# Patient Record
Sex: Female | Born: 1970
Health system: Southern US, Community
[De-identification: ages and names within clinical notes are randomized; demographics above are authoritative.]

## PROBLEM LIST (undated history)

## (undated) DIAGNOSIS — J45909 Unspecified asthma, uncomplicated: Secondary | ICD-10-CM

## (undated) DIAGNOSIS — N189 Chronic kidney disease, unspecified: Secondary | ICD-10-CM

## (undated) DIAGNOSIS — E785 Hyperlipidemia, unspecified: Secondary | ICD-10-CM

## (undated) DIAGNOSIS — E119 Type 2 diabetes mellitus without complications: Secondary | ICD-10-CM

## (undated) DIAGNOSIS — G709 Myoneural disorder, unspecified: Secondary | ICD-10-CM

## (undated) DIAGNOSIS — G43909 Migraine, unspecified, not intractable, without status migrainosus: Secondary | ICD-10-CM

## (undated) DIAGNOSIS — I10 Essential (primary) hypertension: Secondary | ICD-10-CM

## (undated) DIAGNOSIS — D649 Anemia, unspecified: Secondary | ICD-10-CM

## (undated) DIAGNOSIS — M1990 Unspecified osteoarthritis, unspecified site: Secondary | ICD-10-CM

## (undated) DIAGNOSIS — E11319 Type 2 diabetes mellitus with unspecified diabetic retinopathy without macular edema: Secondary | ICD-10-CM

## (undated) DIAGNOSIS — T7840XA Allergy, unspecified, initial encounter: Secondary | ICD-10-CM

## (undated) HISTORY — DX: Chronic kidney disease, unspecified: N18.9

## (undated) HISTORY — DX: Anemia, unspecified: D64.9

## (undated) HISTORY — DX: Myoneural disorder, unspecified: G70.9

## (undated) HISTORY — DX: Allergy, unspecified, initial encounter: T78.40XA

## (undated) HISTORY — DX: Type 2 diabetes mellitus with unspecified diabetic retinopathy without macular edema: E11.319

## (undated) HISTORY — DX: Unspecified asthma, uncomplicated: J45.909

## (undated) HISTORY — DX: Hyperlipidemia, unspecified: E78.5

## (undated) HISTORY — DX: Unspecified osteoarthritis, unspecified site: M19.90

## (undated) HISTORY — DX: Essential (primary) hypertension: I10

## (undated) HISTORY — PX: SHOULDER SURGERY: SHX246

## (undated) HISTORY — DX: Type 2 diabetes mellitus without complications: E11.9

## (undated) HISTORY — DX: Migraine, unspecified, not intractable, without status migrainosus: G43.909

## (undated) HISTORY — PX: TUBAL LIGATION: SHX77

---

## 1998-10-29 HISTORY — PX: REDUCTION MAMMAPLASTY: SUR839

## 1999-10-30 HISTORY — PX: BREAST REDUCTION SURGERY: SHX8

## 2010-10-06 ENCOUNTER — Emergency Department (HOSPITAL_COMMUNITY)
Admission: EM | Admit: 2010-10-06 | Discharge: 2010-10-06 | Payer: Self-pay | Source: Home / Self Care | Admitting: Family Medicine

## 2011-01-09 LAB — GLUCOSE, CAPILLARY: Glucose-Capillary: 576 mg/dL (ref 70–99)

## 2011-01-09 LAB — POCT I-STAT, CHEM 8
Calcium, Ion: 1.15 mmol/L (ref 1.12–1.32)
Chloride: 99 mEq/L (ref 96–112)
Glucose, Bld: 574 mg/dL (ref 70–99)
HCT: 43 % (ref 36.0–46.0)

## 2011-01-09 LAB — POCT URINALYSIS DIPSTICK
Ketones, ur: NEGATIVE mg/dL
Protein, ur: NEGATIVE mg/dL
Specific Gravity, Urine: 1.01 (ref 1.005–1.030)
pH: 6 (ref 5.0–8.0)

## 2012-05-22 ENCOUNTER — Ambulatory Visit: Payer: Self-pay

## 2012-06-26 ENCOUNTER — Ambulatory Visit: Payer: Self-pay

## 2013-02-14 ENCOUNTER — Emergency Department: Payer: Self-pay | Admitting: Internal Medicine

## 2013-02-14 LAB — COMPREHENSIVE METABOLIC PANEL
Albumin: 3.8 g/dL (ref 3.4–5.0)
Bilirubin,Total: 0.6 mg/dL (ref 0.2–1.0)
Chloride: 101 mmol/L (ref 98–107)
Co2: 25 mmol/L (ref 21–32)
EGFR (African American): >60
Glucose: 349 mg/dL — ABNORMAL HIGH (ref 65–99)
Osmolality: 283 (ref 275–301)
Sodium: 134 mmol/L — ABNORMAL LOW (ref 136–145)
Total Protein: 8.7 g/dL — ABNORMAL HIGH (ref 6.4–8.2)

## 2013-02-14 LAB — URINALYSIS, COMPLETE
Bilirubin,UR: NEGATIVE
Glucose,UR: >=500 mg/dL (ref 0–75)
Protein: NEGATIVE
Squamous Epithelial: 7
WBC UR: 7 /HPF (ref 0–5)

## 2013-02-14 LAB — CBC
HGB: 14.6 g/dL (ref 12.0–16.0)
MCH: 30.9 pg (ref 26.0–34.0)
MCHC: 32.8 g/dL (ref 32.0–36.0)
MCV: 94 fL (ref 80–100)
RBC: 4.74 10*6/uL (ref 3.80–5.20)
RDW: 13.1 % (ref 11.5–14.5)
WBC: 7.9 10*3/uL (ref 3.6–11.0)

## 2013-02-18 ENCOUNTER — Ambulatory Visit: Payer: Self-pay | Admitting: Physician Assistant

## 2013-02-24 ENCOUNTER — Ambulatory Visit: Payer: Self-pay | Admitting: Physician Assistant

## 2013-06-25 ENCOUNTER — Ambulatory Visit: Payer: Self-pay

## 2013-10-26 ENCOUNTER — Encounter: Payer: Self-pay | Admitting: Adult Health

## 2013-10-26 ENCOUNTER — Ambulatory Visit (INDEPENDENT_AMBULATORY_CARE_PROVIDER_SITE_OTHER): Payer: BC Managed Care – PPO | Admitting: Adult Health

## 2013-10-26 ENCOUNTER — Encounter (INDEPENDENT_AMBULATORY_CARE_PROVIDER_SITE_OTHER): Payer: Self-pay

## 2013-10-26 VITALS — BP 110/78 | HR 82 | Temp 98.6°F | Resp 12 | Ht 65.5 in | Wt 203.0 lb

## 2013-10-26 DIAGNOSIS — E119 Type 2 diabetes mellitus without complications: Secondary | ICD-10-CM

## 2013-10-26 DIAGNOSIS — Z23 Encounter for immunization: Secondary | ICD-10-CM

## 2013-10-26 MED ORDER — GLUCOSE BLOOD VI STRP: ORAL_STRIP | Status: DC

## 2013-10-26 MED ORDER — ONETOUCH ULTRASOFT LANCETS MISC: Status: DC

## 2013-10-26 NOTE — Assessment & Plan Note (Addendum)
New patient to establish care. Hx of DM not controlled. On lantus 33 units at bedtime, glipizide 10 mg bid and metformin 1000 mg bid. Lisinopril 10 mg daily for renal protection. Check HgbA1c. Diabetic foot exam normal and documented. Reports having recent labs at previous PCP. Request records.

## 2013-10-26 NOTE — Progress Notes (Signed)
Subjective:    Patient ID: Janice Jennings, female    DOB: Aug 21, 1971, 42 y.o.   MRN: [id number]  HPI  Patient is a pleasant 42 year old female who presents to clinic to establish care. She was previously followed by Dr. Ola Jennings at [facility]. She has been seen in the past by Dr. Gabriel Jennings, Endocrinology, for her hx of DM. Her blood sugars have been running in the 260s. She reports traveling to [place] recently and forgot her insulin. She also reports that her insulin pen was not working correctly; however, she had not noticed it. Pt takes lisinopril for renal protection. She does not have hx of HTN. She was also followed at [facility] for her GYN needs. Last PAP 2013. Mammogram done ~ September 2014 and reports also normal. Request records from previous PCP.    Past Medical History  Diagnosis Date  . Diabetes mellitus without complication   . Migraine     Excedrin Tension HA OTC     Past Surgical History  Procedure Laterality Date  . Breast reduction surgery  2001     Family History  Problem Relation Age of Onset  . Diabetes Father   . Hyperlipidemia Father   . Hypertension Father   . Cancer Maternal Aunt 73    leukemia - died  . Diabetes Maternal Grandmother   . Cancer Maternal Grandmother     breast cancer  . Stroke Maternal Grandmother   . Diabetes Daughter     Pre-diabetes  . Diabetes Maternal Grandfather   . Hyperlipidemia Maternal Grandfather   . Hypertension Maternal Grandfather   . Cancer Maternal Aunt 50    breast cancer - died     History   Social History  . Marital Status: Single    Spouse Name: N/A    Number of Children: 2  . Years of Education: 18   Occupational History  . 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 Smokeless tobacco: Never Used  . Alcohol Use: Yes     Comment: Occasional - maybe 3 times a month  . Drug Use: No  . Sexual Activity: Not on file   Other Topics Concern  .  Not on file   Social History Narrative   Janice Jennings grew up in [place], [place] and also in [place]. She attending the [employer] and obtained her Bachelors in Psychology. She then attended [employer] and obtained her Masters in Psychology. 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 She lives at home with her two children, Janice Jennings age 50 and Janice Jennings age 64. They have a dog named Coco who she describes as having separation anxiety. Kalinda enjoys activities with the children. They enjoy outdoor activities, watching movies.       Review of Systems  Constitutional: Negative.   HENT: Negative.   Eyes: Negative.   Respiratory: Negative.   Cardiovascular: Negative.   Gastrointestinal: Negative.   Endocrine: Negative.   Genitourinary: Negative.   Musculoskeletal: Negative.   Skin: Negative.   Allergic/Immunologic: Negative.   Neurological: Negative.   Hematological: Negative.   Psychiatric/Behavioral: Negative.        Objective:   Physical Exam  Constitutional: She is oriented to person, place, and time. She appears well-developed and well-nourished. No distress.  HENT:  Head: Normocephalic and atraumatic.  Right Ear: External ear normal.  Left Ear: External ear normal.  Nose: Nose normal.  Mouth/Throat:  Oropharynx is clear and moist.  Eyes: Conjunctivae and EOM are normal. Pupils are equal, round, and reactive to light.  Neck: Normal range of motion. Neck supple. No tracheal deviation present. No thyromegaly present.  Cardiovascular: Normal rate, regular rhythm, normal heart sounds and intact distal pulses.  Exam reveals no gallop and no friction rub.   No murmur heard. Pulmonary/Chest: Effort normal and breath sounds normal. No respiratory distress. She has no wheezes. She has no rales.  Abdominal: Soft. Bowel sounds are normal. She exhibits no distension and no mass. There is no tenderness. There is no rebound and no guarding.    Musculoskeletal: Normal range of motion. She exhibits no edema and no tenderness.  Lymphadenopathy:    She has no cervical adenopathy.  Neurological: She is alert and oriented to person, place, and time. She has normal reflexes. No cranial nerve deficit. Coordination normal.  Skin: Skin is warm and dry.  Psychiatric: She has a normal mood and affect. Her behavior is normal. Judgment and thought content normal.     BP 110/78  Pulse 82  Temp(Src) 98.6 F (37 C) (Oral)  Resp 12  Ht 5' 5.5" (1.664 m)  Wt 203 lb (92.08 kg)  BMI 33.26 kg/m2  SpO2 97%  LMP 10/12/2013       Assessment & Plan:

## 2013-10-26 NOTE — Patient Instructions (Signed)
  Your lancets and strips have been sent to your pharmacy.   Thank you for choosing [facility] for your health care needs.  Please have your labs drawn prior to leaving the office.  The results will be available through MyChart for your convenience. Please remember to activate this. The activation code is located at the end of this form.

## 2013-10-26 NOTE — Assessment & Plan Note (Signed)
Flu vaccine given during clinic.

## 2013-10-27 ENCOUNTER — Encounter: Payer: Self-pay | Admitting: *Deleted

## 2013-10-30 ENCOUNTER — Encounter: Payer: Self-pay | Admitting: Adult Health

## 2013-10-30 MED ORDER — INSULIN GLARGINE 100 UNIT/ML SOLOSTAR PEN: 33.0000 [IU] | PEN_INJECTOR | Freq: Every day | SUBCUTANEOUS | Status: DC

## 2013-11-02 ENCOUNTER — Encounter: Payer: Self-pay | Admitting: Adult Health

## 2013-11-04 ENCOUNTER — Other Ambulatory Visit: Payer: Self-pay | Admitting: Adult Health

## 2013-11-04 DIAGNOSIS — M25561 Pain in right knee: Secondary | ICD-10-CM

## 2013-11-04 DIAGNOSIS — M25562 Pain in left knee: Principal | ICD-10-CM

## 2014-01-22 ENCOUNTER — Encounter: Payer: Self-pay | Admitting: Adult Health

## 2014-01-22 MED ORDER — INSULIN PEN NEEDLE 31G X 8 MM MISC: Status: DC

## 2014-01-22 MED ORDER — GLIPIZIDE 10 MG PO TABS: 10.0000 mg | ORAL_TABLET | Freq: Two times a day (BID) | ORAL | Status: DC

## 2014-03-08 ENCOUNTER — Encounter: Payer: Self-pay | Admitting: Adult Health

## 2014-03-11 LAB — HM DIABETES EYE EXAM

## 2014-04-29 ENCOUNTER — Encounter: Payer: Self-pay | Admitting: Adult Health

## 2014-05-12 ENCOUNTER — Encounter: Payer: Self-pay | Admitting: *Deleted

## 2014-05-21 ENCOUNTER — Encounter: Payer: Self-pay | Admitting: *Deleted

## 2014-05-21 NOTE — Progress Notes (Signed)
Chart reviewed for DM bundle. Mychart sent on need for appt and labs.  Appt sch 05/26/14

## 2014-05-25 ENCOUNTER — Ambulatory Visit: Payer: BC Managed Care – PPO | Admitting: Adult Health

## 2014-05-26 ENCOUNTER — Encounter: Payer: Self-pay | Admitting: Adult Health

## 2014-05-26 ENCOUNTER — Ambulatory Visit (INDEPENDENT_AMBULATORY_CARE_PROVIDER_SITE_OTHER): Payer: BC Managed Care – PPO | Admitting: Adult Health

## 2014-05-26 VITALS — BP 136/90 | HR 90 | Temp 98.5°F | Resp 14 | Wt 204.8 lb

## 2014-05-26 DIAGNOSIS — IMO0001 Reserved for inherently not codable concepts without codable children: Secondary | ICD-10-CM

## 2014-05-26 DIAGNOSIS — M6530 Trigger finger, unspecified finger: Secondary | ICD-10-CM | POA: Insufficient documentation

## 2014-05-26 DIAGNOSIS — E1165 Type 2 diabetes mellitus with hyperglycemia: Secondary | ICD-10-CM

## 2014-05-26 DIAGNOSIS — E1142 Type 2 diabetes mellitus with diabetic polyneuropathy: Secondary | ICD-10-CM | POA: Insufficient documentation

## 2014-05-26 DIAGNOSIS — IMO0002 Reserved for concepts with insufficient information to code with codable children: Secondary | ICD-10-CM

## 2014-05-26 DIAGNOSIS — Z23 Encounter for immunization: Secondary | ICD-10-CM | POA: Insufficient documentation

## 2014-05-26 LAB — LIPID PANEL
CHOL/HDL RATIO: 4
Cholesterol: 183 mg/dL (ref 0–200)
HDL: 41.5 mg/dL (ref >39.00)
LDL Cholesterol: 118 mg/dL — ABNORMAL HIGH (ref 0–99)
NONHDL: 141.5
Triglycerides: 120 mg/dL (ref 0.0–149.0)
VLDL: 24 mg/dL (ref 0.0–40.0)

## 2014-05-26 LAB — HEMOGLOBIN A1C: HEMOGLOBIN A1C: 7.9 % — AB (ref 4.6–6.5)

## 2014-05-26 NOTE — Progress Notes (Signed)
Pre visit review using our clinic review tool, if applicable. No additional management support is needed unless otherwise documented below in the visit note. 

## 2014-05-26 NOTE — Progress Notes (Signed)
Patient ID: Janice Jennings, female   DOB: 25-Sep-1971, 43 y.o.   MRN: [id number]   Subjective:    Patient ID: Janice Jennings, female    DOB: October 26, 1971, 43 y.o.   MRN: [id number]  HPI  Pt presents for DM type 2 uncontrolled follow up. She reports that she is taking her medications as prescribed. She was not following appropriate diet towards the end of May when she developed a tooth infection that was difficult to heal. She reports she just didn't do well with the diet. Diabetic eye exam every 6 months at [facility]. I will request the report.   Past Medical History  Diagnosis Date  . Diabetes mellitus without complication   . Migraine     Excedrin Tension HA OTC    Current Outpatient Prescriptions on File Prior to Visit  Medication Sig Dispense Refill  . Cinnamon 500 MG capsule Take 500 mg by mouth daily.      Marland Kitchen glipiZIDE (GLUCOTROL) 10 MG tablet Take 1 tablet (10 mg total) by mouth 2 (two) times daily before a meal.  60 tablet  5  . glucose blood test strip One Touch Ultra, check blood sugar daily  100 each  6  . Insulin Glargine (LANTUS SOLOSTAR) 100 UNIT/ML Solostar Pen Inject 33 Units into the skin at bedtime.  15 mL  5  . Insulin Pen Needle 31G X 8 MM MISC Use as directed  100 each  5  . Lancets (ONETOUCH ULTRASOFT) lancets Use as instructed daily  100 each  6  . lisinopril (PRINIVIL,ZESTRIL) 10 MG tablet Take 10 mg by mouth daily.       . metFORMIN (GLUCOPHAGE) 1000 MG tablet Take 1,000 mg by mouth 2 (two) times daily with a meal.       . Multiple Vitamin (MULTIVITAMIN) tablet Take 1 tablet by mouth daily.       No current facility-administered medications on file prior to visit.   Review of Systems  Constitutional: Negative.   HENT: Negative.   Eyes: Negative.   Respiratory: Negative.   Cardiovascular: Negative.   Gastrointestinal: Negative.   Endocrine: Negative.   Genitourinary: Negative.   Musculoskeletal:       Right hand ring finger is bent. She reports that she  cannot straighten. "it won't stay". She denies pain or injury to the area.  Skin: Negative.   Allergic/Immunologic: Negative.   Neurological: Negative.   Hematological: Negative.   Psychiatric/Behavioral: Negative.        Objective:  BP 136/90  Pulse 90  Temp(Src) 98.5 F (36.9 C) (Oral)  Resp 14  Wt 204 lb 12 oz (92.874 kg)  SpO2 98%   Physical Exam  Constitutional: She is oriented to person, place, and time. No distress.  HENT:  Head: Normocephalic and atraumatic.  Eyes: Conjunctivae and EOM are normal.  Neck: Normal range of motion. Neck supple.  Cardiovascular: Normal rate, regular rhythm, normal heart sounds and intact distal pulses.  Exam reveals no gallop and no friction rub.   No murmur heard. Pulmonary/Chest: Effort normal and breath sounds normal. No respiratory distress. She has no wheezes. She has no rales.  Musculoskeletal:  Right hand ring finger is bent. Straightens manually without pain or difficulty. No inflammation noted.  Neurological: She is alert and oriented to person, place, and time. She has normal reflexes. Coordination normal.  Skin: Skin is warm and dry.  Psychiatric: She has a normal mood and affect. Her behavior is normal. Judgment and thought content normal.  Assessment & Plan:   1. Diabetes type 2, uncontrolled Check labs. Adjust medications if indicated. Follow up 3 months - Lipid panel - Hemoglobin A1c; Future  2. Need for prophylactic vaccination against Streptococcus pneumoniae (pneumococcus) Given in clinic  3. Need for vaccine for TD (tetanus-diphtheria) Given in clinic  4. Trigger finger of right hand Splint finger. Massage. If no improvement in 2 weeks will refer to Dr. Jefm Bryant.

## 2014-05-26 NOTE — Patient Instructions (Signed)
  Please have labs drawn prior to leaving the office.  We will contact you with the results.  Continue medications and I will adjust as needed based on your results.  Follow diabetic diet, increase physical activity and monitor your blood glucose at least twice daily and records.  Apply a splint to your ring finger. Keep on during the day and remove at bedtime. Massage your palm up to your finger. If no improvement within 2 weeks I will refer you to a specialist.

## 2014-06-22 ENCOUNTER — Encounter: Payer: Self-pay | Admitting: Adult Health

## 2014-07-19 ENCOUNTER — Other Ambulatory Visit: Payer: Self-pay

## 2014-07-19 MED ORDER — GLIPIZIDE 10 MG PO TABS: 10.0000 mg | ORAL_TABLET | Freq: Two times a day (BID) | ORAL | Status: DC

## 2014-07-19 NOTE — Telephone Encounter (Signed)
Patient requested 90 day supply.

## 2014-07-24 ENCOUNTER — Ambulatory Visit (INDEPENDENT_AMBULATORY_CARE_PROVIDER_SITE_OTHER): Payer: BC Managed Care – PPO

## 2014-07-24 DIAGNOSIS — Z23 Encounter for immunization: Secondary | ICD-10-CM

## 2014-09-22 ENCOUNTER — Encounter: Payer: Self-pay | Admitting: *Deleted

## 2014-09-22 ENCOUNTER — Ambulatory Visit: Payer: Self-pay | Admitting: Adult Health

## 2014-09-22 LAB — HM MAMMOGRAPHY

## 2014-09-24 ENCOUNTER — Ambulatory Visit: Payer: BC Managed Care – PPO | Admitting: Internal Medicine

## 2014-09-25 ENCOUNTER — Encounter: Payer: Self-pay | Admitting: Internal Medicine

## 2014-09-25 ENCOUNTER — Other Ambulatory Visit: Payer: Self-pay | Admitting: *Deleted

## 2014-09-27 MED ORDER — LISINOPRIL 10 MG PO TABS: 10.0000 mg | ORAL_TABLET | Freq: Every day | ORAL | Status: DC

## 2014-10-05 ENCOUNTER — Encounter: Payer: Self-pay | Admitting: Adult Health

## 2014-10-20 ENCOUNTER — Ambulatory Visit (INDEPENDENT_AMBULATORY_CARE_PROVIDER_SITE_OTHER): Payer: BC Managed Care – PPO | Admitting: Nurse Practitioner

## 2014-10-20 ENCOUNTER — Ambulatory Visit: Payer: BC Managed Care – PPO | Admitting: Internal Medicine

## 2014-10-20 ENCOUNTER — Encounter: Payer: Self-pay | Admitting: Nurse Practitioner

## 2014-10-20 VITALS — BP 128/76 | HR 92 | Temp 98.6°F | Resp 12 | Ht 65.5 in | Wt 202.4 lb

## 2014-10-20 DIAGNOSIS — IMO0002 Reserved for concepts with insufficient information to code with codable children: Secondary | ICD-10-CM

## 2014-10-20 DIAGNOSIS — E1165 Type 2 diabetes mellitus with hyperglycemia: Secondary | ICD-10-CM

## 2014-10-20 DIAGNOSIS — E669 Obesity, unspecified: Secondary | ICD-10-CM | POA: Insufficient documentation

## 2014-10-20 DIAGNOSIS — Z7251 High risk heterosexual behavior: Secondary | ICD-10-CM

## 2014-10-20 LAB — MICROALBUMIN / CREATININE URINE RATIO
Creatinine,U: 83 mg/dL
Microalb Creat Ratio: 11.2 mg/g (ref 0.0–30.0)
Microalb, Ur: 9.3 mg/dL — ABNORMAL HIGH (ref 0.0–1.9)

## 2014-10-20 LAB — HEMOGLOBIN A1C: HEMOGLOBIN A1C: 9 % — AB (ref 4.6–6.5)

## 2014-10-20 NOTE — Patient Instructions (Addendum)
We will contact you with your results.  Continue using a condom until testing complete.   Make sure to check your blood sugars at least once a day and bring in a log of recorded blood sugars. (Date, time, fasting or after meals, blood sugar)  Follow up in 3 months.  Happy Holidays!

## 2014-10-20 NOTE — Assessment & Plan Note (Addendum)
New sexual partner. Pt would like to be screened for STD's. Partner is being tested elsewhere. 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 Hand out with information about HSV 2.

## 2014-10-20 NOTE — Progress Notes (Signed)
Subjective:    Patient ID: Janice Jennings, female    DOB: 05/17/1971, 43 y.o.   MRN: [id number]  HPI  [name] is a 43 yo female with a 3 month FU for diabetes type II.   1) She is on Metformin 1000 mg tablets twice daily.   Glipizide 10 mg tablet twice daily  Insulin glargine once at night 30 units  Below 200 fasting at home, only checks when something is off   She is UTD on her eye exam, A1c,  flu shot, and pneumonia vaccine  Lab Results  Component Value Date   CHOL 183 05/26/2014   HDL 41.50 05/26/2014   LDLCALC 118* 05/26/2014   TRIG 120.0 05/26/2014   CHOLHDL 4 05/26/2014   Lab Results  Component Value Date   HGBA1C 7.9* 05/26/2014   2) New sexual partner x 3 months, using protection currently- condoms, and discussing not using. Pt has tubes tied in 2004.   Review of Systems  Constitutional: Negative for fever, chills, diaphoresis, appetite change, fatigue and unexpected weight change.  Gastrointestinal: Negative for nausea, vomiting and diarrhea.  Endocrine: Negative for polydipsia, polyphagia and polyuria.  Skin: Negative for rash.   Past Medical History  Diagnosis Date  . Diabetes mellitus without complication   . Migraine     Excedrin Tension HA OTC    History   Social History  . Marital Status: Single    Spouse Name: N/A    Number of Children: 2  . Years of Education: 18   Occupational History  . 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 Smokeless tobacco: Never Used  . Alcohol Use: Yes     Comment: Occasional - maybe 3 times a month  . Drug Use: No  . Sexual Activity: Not on file   Other Topics Concern  . Not on file   Social History Narrative   [name] grew up in [place], [place] and also in [place]. She attending the [employer] and obtained her Bachelors in Psychology. She then attended [employer] and obtained her Masters in Psychology. 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 She lives at home with her two children, Darius age 29 and [place] age 30. They have a dog named Coco who she describes as having separation anxiety. Amarachukwu enjoys activities with the children. They enjoy outdoor activities, watching movies.      Past Surgical History  Procedure Laterality Date  . Breast reduction surgery  2001    Family History  Problem Relation Age of Onset  . Diabetes Father   . Hyperlipidemia Father   . Hypertension Father   . Cancer Maternal Aunt 64    leukemia - died  . Diabetes Maternal Grandmother   . Cancer Maternal Grandmother     breast cancer  . Stroke Maternal Grandmother   . Diabetes Daughter     Pre-diabetes  . Diabetes Maternal Grandfather   . Hyperlipidemia Maternal Grandfather   . Hypertension Maternal Grandfather   . Cancer Maternal Aunt 50    breast cancer - died    No Known Allergies  Current Outpatient Prescriptions on File Prior to Visit  Medication Sig Dispense Refill  . Biotin 5000 MCG CAPS Take 10,000 mcg by mouth daily.    . Cinnamon 500 MG capsule Take 500 mg by mouth daily.    Marland Kitchen GARCINIA CAMBOGIA-CHROMIUM PO Take 1  capsule by mouth daily.    Marland Kitchen glipiZIDE (GLUCOTROL) 10 MG tablet Take 1 tablet (10 mg total) by mouth 2 (two) times daily before a meal. 180 tablet 1  . glucose blood test strip One Touch Ultra, check blood sugar daily 100 each 6  . Insulin Glargine (LANTUS SOLOSTAR) 100 UNIT/ML Solostar Pen Inject 33 Units into the skin at bedtime. 15 mL 5  . Insulin Pen Needle 31G X 8 MM MISC Use as directed 100 each 5  . Lancets (ONETOUCH ULTRASOFT) lancets Use as instructed daily 100 each 6  . lisinopril (PRINIVIL,ZESTRIL) 10 MG tablet Take 1 tablet (10 mg total) by mouth daily. 90 tablet 1  . metFORMIN (GLUCOPHAGE) 1000 MG tablet Take 1,000 mg by mouth 2 (two) times daily with a meal.     . Multiple Vitamin (MULTIVITAMIN) tablet Take 1 tablet by mouth daily.    Marland Kitchen OVER THE COUNTER  MEDICATION 1 tablet daily. Keratin vitamin     No current facility-administered medications on file prior to visit.       Objective:   Physical Exam  Constitutional: She is oriented to person, place, and time. She appears well-developed and well-nourished. No distress.  HENT:  Head: Normocephalic and atraumatic.  Mouth/Throat: No oropharyngeal exudate.  Eyes: Conjunctivae and EOM are normal. Pupils are equal, round, and reactive to light.  Neck: Normal range of motion.  Cardiovascular: Normal rate and regular rhythm.   Pulmonary/Chest: Effort normal and breath sounds normal.  Musculoskeletal: Normal range of motion. She exhibits no edema or tenderness.  Neurological: She is alert and oriented to person, place, and time. Coordination normal.  Skin: Skin is warm and dry. No rash noted. She is not diaphoretic.  No wounds on feet bilaterally  Psychiatric: She has a normal mood and affect. Her behavior is normal. Judgment and thought content normal.     BP 128/76 mmHg  Pulse 92  Temp(Src) 98.6 F (37 C) (Oral)  Resp 12  Ht 5' 5.5" (1.664 m)  Wt 202 lb 6.4 oz (91.808 kg)  BMI 33.16 kg/m2  SpO2 95%  LMP 09/30/2014     Assessment & Plan:

## 2014-10-20 NOTE — Assessment & Plan Note (Addendum)
Uncontrolled- On metformin 1000 mg bid and glipizide 10 mg bid and insulin glargine QHS 30 units. Foot exam was normal today and documented. Obtain A1c and urine microalbumin. Asked her to record BS, time, and date and try to check daily. Will FU in 3 months

## 2014-10-20 NOTE — Progress Notes (Signed)
Pre visit review using our clinic review tool, if applicable. No additional management support is needed unless otherwise documented below in the visit note. 

## 2014-10-21 LAB — GC/CHLAMYDIA PROBE AMP, URINE
Chlamydia, Swab/Urine, PCR: NEGATIVE
GC PROBE AMP, URINE: NEGATIVE

## 2014-10-21 LAB — STD PANEL
HIV 1&2 Ab, 4th Generation: NONREACTIVE
Hepatitis B Surface Ag: NEGATIVE

## 2014-10-21 LAB — HSV 2 ANTIBODY, IGG: HSV 2 Glycoprotein G Ab, IgG: 13.79 IV — ABNORMAL HIGH

## 2014-12-03 ENCOUNTER — Encounter: Payer: Self-pay | Admitting: Nurse Practitioner

## 2014-12-03 MED ORDER — METFORMIN HCL 1000 MG PO TABS: 1000.0000 mg | ORAL_TABLET | Freq: Two times a day (BID) | ORAL | Status: DC

## 2015-01-14 ENCOUNTER — Ambulatory Visit: Payer: BC Managed Care – PPO | Admitting: Nurse Practitioner

## 2015-02-21 ENCOUNTER — Other Ambulatory Visit: Payer: Self-pay

## 2015-02-21 MED ORDER — GLIPIZIDE 10 MG PO TABS: 10.0000 mg | ORAL_TABLET | Freq: Two times a day (BID) | ORAL | Status: DC

## 2015-03-07 ENCOUNTER — Encounter: Payer: Self-pay | Admitting: Internal Medicine

## 2015-03-07 ENCOUNTER — Ambulatory Visit (INDEPENDENT_AMBULATORY_CARE_PROVIDER_SITE_OTHER): Payer: BC Managed Care – PPO | Admitting: Internal Medicine

## 2015-03-07 ENCOUNTER — Telehealth: Payer: Self-pay | Admitting: Nurse Practitioner

## 2015-03-07 VITALS — BP 166/100 | HR 104 | Temp 98.7°F | Wt 209.0 lb

## 2015-03-07 DIAGNOSIS — I10 Essential (primary) hypertension: Secondary | ICD-10-CM | POA: Diagnosis not present

## 2015-03-07 LAB — COMPREHENSIVE METABOLIC PANEL
ALT: 16 U/L (ref 0–35)
AST: 14 U/L (ref 0–37)
Albumin: 3.6 g/dL (ref 3.5–5.2)
Alkaline Phosphatase: 63 U/L (ref 39–117)
BILIRUBIN TOTAL: 0.5 mg/dL (ref 0.2–1.2)
BUN: 11 mg/dL (ref 6–23)
CO2: 28 meq/L (ref 19–32)
Calcium: 9.9 mg/dL (ref 8.4–10.5)
Chloride: 99 mEq/L (ref 96–112)
Creatinine, Ser: 0.85 mg/dL (ref 0.40–1.20)
GFR: 92.61 mL/min (ref >60.00)
Glucose, Bld: 235 mg/dL — ABNORMAL HIGH (ref 70–99)
POTASSIUM: 4 meq/L (ref 3.5–5.1)
SODIUM: 134 meq/L — AB (ref 135–145)
Total Protein: 7.5 g/dL (ref 6.0–8.3)

## 2015-03-07 LAB — CBC
HEMATOCRIT: 41 % (ref 36.0–46.0)
Hemoglobin: 13.6 g/dL (ref 12.0–15.0)
MCHC: 33.2 g/dL (ref 30.0–36.0)
MCV: 91.8 fl (ref 78.0–100.0)
Platelets: 324 10*3/uL (ref 150.0–400.0)
RBC: 4.46 Mil/uL (ref 3.87–5.11)
RDW: 13.6 % (ref 11.5–15.5)
WBC: 8.2 10*3/uL (ref 4.0–10.5)

## 2015-03-07 MED ORDER — LISINOPRIL-HYDROCHLOROTHIAZIDE 10-12.5 MG PO TABS: 1.0000 | ORAL_TABLET | Freq: Every day | ORAL | Status: DC

## 2015-03-07 NOTE — Telephone Encounter (Signed)
Thanks for your help.

## 2015-03-07 NOTE — Telephone Encounter (Signed)
[facility] Patient Name: Janice Jennings DOB: 1971/06/08 Initial Comment Caller States her bp is high 174/110 and is having headaches. Nurse Assessment Nurse: Donalynn Furlong, RN, Myna Hidalgo Date/Time Eilene Ghazi Time): 03/07/2015 12:04:26 PM Confirm and document reason for call. If symptomatic, describe symptoms. ---Caller States her bp is high 174/110 (Urgent care) and is having headaches. Has the patient traveled out of the country within the last 30 days? ---No Does the patient require triage? ---Yes Related visit to physician within the last 2 weeks? ---Yes Does the PT have any chronic conditions? (i.e. diabetes, asthma, etc.) ---Yes List chronic conditions. ---high blood pressure, diabetes Did the patient indicate they were pregnant? ---No Guidelines Guideline Title Affirmed Question Affirmed Notes Headache [1] SEVERE headache (e.g., excruciating) AND [2] not improved after 2 hours of pain medicine Final Disposition User See Physician within 4 Hours (or PCP triage) Donalynn Furlong, RN, Myna Hidalgo Comments . Pt takes Lisinopril 10 mg po q HS. Advised pt to take it now, early as she has it with her., and I will attempt to have her seen at office w/i 4 hrs

## 2015-03-07 NOTE — Progress Notes (Signed)
Pre visit review using our clinic review tool, if applicable. No additional management support is needed unless otherwise documented below in the visit note. 

## 2015-03-07 NOTE — Patient Instructions (Signed)

## 2015-03-07 NOTE — Progress Notes (Signed)
Subjective:    Patient ID: Janice Jennings, female    DOB: 10-21-1971, 44 y.o.   MRN: [id number]  HPI  Pt presents to the clinic today with c/o elevated blood pressures. She was seen at [facility] today for a headache that she has had for the last week. There her BP was 174/110. She is taking Lisinopril 10 mg daily (for renal protection for DM 2). She describes her headache which she describes as "excruciating". The pain is in her forehead. She denies dizziness or blood pressure. Her BP today is 166/100. Her blood pressure at home has been running 170's/100's.  Review of Systems      Past Medical History  Diagnosis Date  . Diabetes mellitus without complication   . Migraine     Excedrin Tension HA OTC    Current Outpatient Prescriptions  Medication Sig Dispense Refill  . Biotin 5000 MCG CAPS Take 10,000 mcg by mouth daily.    . Cinnamon 500 MG capsule Take 500 mg by mouth daily.    Marland Kitchen GARCINIA CAMBOGIA-CHROMIUM PO Take 1 capsule by mouth daily.    Marland Kitchen glipiZIDE (GLUCOTROL) 10 MG tablet Take 1 tablet (10 mg total) by mouth 2 (two) times daily before a meal. 180 tablet 1  . glucose blood test strip One Touch Ultra, check blood sugar daily 100 each 6  . Insulin Glargine (LANTUS SOLOSTAR) 100 UNIT/ML Solostar Pen Inject 33 Units into the skin at bedtime. 15 mL 5  . Insulin Pen Needle 31G X 8 MM MISC Use as directed 100 each 5  . Lancets (ONETOUCH ULTRASOFT) lancets Use as instructed daily 100 each 6  . lisinopril (PRINIVIL,ZESTRIL) 10 MG tablet Take 1 tablet (10 mg total) by mouth daily. 90 tablet 1  . metFORMIN (GLUCOPHAGE) 1000 MG tablet Take 1 tablet (1,000 mg total) by mouth 2 (two) times daily with a meal. 180 tablet 1  . Multiple Vitamin (MULTIVITAMIN) tablet Take 1 tablet by mouth daily.    Marland Kitchen OVER THE COUNTER MEDICATION 1 tablet daily. Keratin vitamin     No current facility-administered medications for this visit.    No Known Allergies  Family History  Problem Relation Age of Onset    . Diabetes Father   . Hyperlipidemia Father   . Hypertension Father   . Cancer Maternal Aunt 31    leukemia - died  . Diabetes Maternal Grandmother   . Cancer Maternal Grandmother     breast cancer  . Stroke Maternal Grandmother   . Diabetes Daughter     Pre-diabetes  . Diabetes Maternal Grandfather   . Hyperlipidemia Maternal Grandfather   . Hypertension Maternal Grandfather   . Cancer Maternal Aunt 50    breast cancer - died    History   Social History  . Marital Status: Single    Spouse Name: N/A  . Number of Children: 2  . Years of Education: 18   Occupational History  . 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 Smokeless tobacco: Never Used  . Alcohol Use: Yes     Comment: Occasional - maybe 3 times a month  . Drug Use: No  . Sexual Activity: Not on file   Other Topics Concern  . Not on file   Social History Narrative   Augusta grew up in [place], [place] and also in [place]. She attending the [employer] and obtained her Bachelors in Psychology. She then  attended [employer] and obtained her Masters in Psychology. 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 She lives at home with her two children, Darius age 49 and [place] age 71. They have a dog named Coco who she describes as having separation anxiety. Christella enjoys activities with the children. They enjoy outdoor activities, watching movies.       Constitutional: Pt reports headache. Denies fever, malaise, fatigue, headache or abrupt weight changes.  Respiratory: Denies difficulty breathing, shortness of breath, cough or sputum production.   Cardiovascular: Denies chest pain, chest tightness, palpitations or swelling in the hands or feet.  Neurological: Denies dizziness, difficulty with memory, difficulty with speech or problems with balance and coordination.   No other specific complaints in a complete  review of systems (except as listed in HPI above).  Objective:   Physical Exam   BP 166/100 mmHg  Pulse 104  Temp(Src) 98.7 F (37.1 C) (Oral)  Wt 209 lb (94.802 kg)  SpO2 98% Wt Readings from Last 3 Encounters:  03/07/15 209 lb (94.802 kg)  10/20/14 202 lb 6.4 oz (91.808 kg)  05/26/14 204 lb 12 oz (92.874 kg)    General: Appears her stated age, obese in NAD. HEENT: Head: normal shape and size; Eyes: sclera white, no icterus, conjunctiva pink, PERRLA and EOMs intact;  Cardiovascular: Normal rate and rhythm. S1,S2 noted.  No murmur, rubs or gallops noted.No BLE noted. Pulmonary/Chest: Normal effort and positive vesicular breath sounds. No respiratory distress. No wheezes, rales or ronchi noted.  Neurological: Alert and oriented.  Coordination normal.   BMET    Component Value Date/Time   NA 134* 02/14/2013 1416   NA 134* 10/06/2010 1626   K 4.1 02/14/2013 1416   K 4.1 10/06/2010 1626   CL 101 02/14/2013 1416   CL 99 10/06/2010 1626   CO2 25 02/14/2013 1416   GLUCOSE 349* 02/14/2013 1416   GLUCOSE 574* 10/06/2010 1626   BUN 15 02/14/2013 1416   BUN 10 10/06/2010 1626   CREATININE 1.10 02/14/2013 1416   CREATININE 0.8 10/06/2010 1626   CALCIUM 9.4 02/14/2013 1416   GFRNONAA >60 02/14/2013 1416   GFRAA >60 02/14/2013 1416    Lipid Panel     Component Value Date/Time   CHOL 183 05/26/2014 0955   TRIG 120.0 05/26/2014 0955   HDL 41.50 05/26/2014 0955   CHOLHDL 4 05/26/2014 0955   VLDL 24.0 05/26/2014 0955   LDLCALC 118* 05/26/2014 0955    CBC    Component Value Date/Time   WBC 7.9 02/14/2013 1416   RBC 4.74 02/14/2013 1416   HGB 14.6 02/14/2013 1416   HGB 14.6 10/06/2010 1626   HCT 44.6 02/14/2013 1416   HCT 43.0 10/06/2010 1626   PLT 338 02/14/2013 1416   MCV 94 02/14/2013 1416   MCH 30.9 02/14/2013 1416   MCHC 32.8 02/14/2013 1416   RDW 13.1 02/14/2013 1416    Hgb A1C Lab Results  Component Value Date   HGBA1C 9.0* 10/20/2014          Assessment & Plan:   HTN:  New onset Will check CBC and CMET today eRx for Lisinopril-HCTZ 10-12.5 Consume a low sodium diet and work on weight loss  RTC in 3-4 weeks to recheck BP

## 2015-03-07 NOTE — Telephone Encounter (Signed)
FYI, pt has appoint with Webb Silversmith at 2pm.

## 2015-03-08 ENCOUNTER — Ambulatory Visit: Payer: Self-pay | Admitting: Nurse Practitioner

## 2015-03-09 ENCOUNTER — Other Ambulatory Visit: Payer: Self-pay | Admitting: *Deleted

## 2015-03-09 MED ORDER — INSULIN GLARGINE 100 UNIT/ML SOLOSTAR PEN: 33.0000 [IU] | PEN_INJECTOR | Freq: Every day | SUBCUTANEOUS | Status: DC

## 2015-03-14 ENCOUNTER — Telehealth: Payer: Self-pay | Admitting: *Deleted

## 2015-03-14 ENCOUNTER — Telehealth: Payer: Self-pay | Admitting: Internal Medicine

## 2015-03-14 ENCOUNTER — Telehealth: Payer: Self-pay | Admitting: Nurse Practitioner

## 2015-03-14 NOTE — Telephone Encounter (Signed)
That is okay with me if it is okay with Rollene Fare. Thanks!

## 2015-03-14 NOTE — Telephone Encounter (Signed)
The July appt should be to transfer care from [facility]

## 2015-03-14 NOTE — Telephone Encounter (Signed)
Patient left a voicemail stating that she is still having headaches and BP is still up and has been averaging 170/110. Patient request a call back wanting to know what she should do now?

## 2015-03-14 NOTE — Telephone Encounter (Signed)
Have her take 2 of her BP pills at one time equalling 20-25 of Lisinopril-HCT, make sure she has follow up appt with me in 2 weeks

## 2015-03-14 NOTE — Telephone Encounter (Signed)
There are 2 telephone notes--1 states you want pt to double her BP meds and f/u with you in 2 weeks--and another states pt needs to f/u in July--please advise

## 2015-03-14 NOTE — Telephone Encounter (Signed)
This is fine with me, have her make follow up appt in July

## 2015-03-14 NOTE — Telephone Encounter (Signed)
Please disregard message, i saw your answer, sorry

## 2015-03-14 NOTE — Telephone Encounter (Signed)
Father came in concerned because pt called at 10 am this am complaining of high bp and they have not received any calls back  today.  Her bp this am 179/100, and has severe headaches.  Please call pt at [phone]. Thanks.

## 2015-03-14 NOTE — Telephone Encounter (Signed)
Pt called wanting to switch PCP from Lorane Gell to [facility] Is this ok??  Pt saw Rollene Fare on 03/07/15

## 2015-03-15 NOTE — Telephone Encounter (Signed)
Pt already had appointment with regina 04/04/15 made appointment 30 min Pt aware

## 2015-04-04 ENCOUNTER — Ambulatory Visit: Payer: BC Managed Care – PPO | Admitting: Internal Medicine

## 2015-04-04 ENCOUNTER — Encounter: Payer: Self-pay | Admitting: Internal Medicine

## 2015-04-04 ENCOUNTER — Ambulatory Visit (INDEPENDENT_AMBULATORY_CARE_PROVIDER_SITE_OTHER): Payer: BC Managed Care – PPO | Admitting: Internal Medicine

## 2015-04-04 VITALS — BP 132/92 | HR 91 | Temp 98.5°F | Wt 207.0 lb

## 2015-04-04 DIAGNOSIS — E119 Type 2 diabetes mellitus without complications: Secondary | ICD-10-CM

## 2015-04-04 DIAGNOSIS — G629 Polyneuropathy, unspecified: Secondary | ICD-10-CM | POA: Diagnosis not present

## 2015-04-04 DIAGNOSIS — I10 Essential (primary) hypertension: Secondary | ICD-10-CM

## 2015-04-04 DIAGNOSIS — E1142 Type 2 diabetes mellitus with diabetic polyneuropathy: Secondary | ICD-10-CM | POA: Diagnosis not present

## 2015-04-04 MED ORDER — GABAPENTIN 100 MG PO CAPS: 100.0000 mg | ORAL_CAPSULE | Freq: Every day | ORAL | Status: DC

## 2015-04-04 MED ORDER — LISINOPRIL-HYDROCHLOROTHIAZIDE 20-25 MG PO TABS
1.0000 | ORAL_TABLET | Freq: Every day | ORAL | Status: DC
Start: 2015-04-04 — End: 2015-06-26

## 2015-04-04 NOTE — Assessment & Plan Note (Signed)
Foot exam today Flu, pneumovax and eye exam UTD Will check A1C today Will adjust Metformin/ Glipizide if needed

## 2015-04-04 NOTE — Assessment & Plan Note (Signed)
There has been some confusion regarding her medication Will increase her Lisinopril-HCT to 20-25 mg D/c the Lisinopril 10 mg  BMET today

## 2015-04-04 NOTE — Progress Notes (Addendum)
Subjective:    Patient ID: Janice Jennings, female    DOB: 1971/10/08, 44 y.o.   MRN: [id number]  HPI  Pt presents to the clinic today for 1 month follow up of HTN. She was started on Lisinopril- HCT at her last visit. She has been taking her BP at home for about 1 week and reports her BP had remained elevated, around 170/110. I advised her to take 2 tablets of her Lisinopril-HCT. She has been taking 1 tab of Lisinopril- HCT and 1 tab of Lisinopril from her previous RX. Her BP today is 132/92. Her headache has gone away and she reports she is feeling much better. She denies chest pain, or shortness of breath.  She reports it is also time to follow up on her DM2. Her last A1C in January was 9.0%.  Burning in her feet. She is taking Metformin and Glipizide as instructed. She denies any adverse effects. She has not been checking her sugars like she should. She does try to consume a low carb diet. She has gained 3.5 lbs since 09/2015. She has had an eye exam this year. Flu and Pneumovax are UTD. She does c/o a burning sensation in her feet. This is new for her. It seems to be worse at night. She reports she still has good sensation in her feet. She can not remember the last time she had a foot exam. She has not taken anything for the pain in her feet.  Review of Systems      Past Medical History  Diagnosis Date  . Diabetes mellitus without complication   . Migraine     Excedrin Tension HA OTC    Current Outpatient Prescriptions  Medication Sig Dispense Refill  . Biotin 5000 MCG CAPS Take 10,000 mcg by mouth daily.    . Cinnamon 500 MG capsule Take 500 mg by mouth daily.    Marland Kitchen GARCINIA CAMBOGIA-CHROMIUM PO Take 1 capsule by mouth daily.    Marland Kitchen glipiZIDE (GLUCOTROL) 10 MG tablet Take 1 tablet (10 mg total) by mouth 2 (two) times daily before a meal. 180 tablet 1  . glucose blood test strip One Touch Ultra, check blood sugar daily 100 each 6  . Insulin Glargine (LANTUS SOLOSTAR) 100 UNIT/ML  Solostar Pen Inject 33 Units into the skin at bedtime. 15 mL 5  . Insulin Pen Needle 31G X 8 MM MISC Use as directed 100 each 5  . Lancets (ONETOUCH ULTRASOFT) lancets Use as instructed daily 100 each 6  . metFORMIN (GLUCOPHAGE) 1000 MG tablet Take 1 tablet (1,000 mg total) by mouth 2 (two) times daily with a meal. 180 tablet 1  . Multiple Vitamin (MULTIVITAMIN) tablet Take 1 tablet by mouth daily.    Marland Kitchen OVER THE COUNTER MEDICATION 1 tablet daily. Keratin vitamin    . gabapentin (NEURONTIN) 100 MG capsule Take 1 capsule (100 mg total) by mouth at bedtime. 30 capsule 2  . lisinopril-hydrochlorothiazide (PRINZIDE,ZESTORETIC) 20-25 MG per tablet Take 1 tablet by mouth daily. 90 tablet 0   No current facility-administered medications for this visit.    No Known Allergies  Family History  Problem Relation Age of Onset  . Diabetes Father   . Hyperlipidemia Father   . Hypertension Father   . Cancer Maternal Aunt 15    leukemia - died  . Diabetes Maternal Grandmother   . Cancer Maternal Grandmother     breast cancer  . Stroke Maternal Grandmother   . Diabetes Daughter  Pre-diabetes  . Diabetes Maternal Grandfather   . Hyperlipidemia Maternal Grandfather   . Hypertension Maternal Grandfather   . Cancer Maternal Aunt 50    breast cancer - died    History   Social History  . Marital Status: Single    Spouse Name: N/A  . Number of Children: 2  . Years of Education: 18   Occupational History  . 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 Smokeless tobacco: Never Used  . Alcohol Use: 0.0 oz/week    0 Standard drinks or equivalent per week     Comment: Occasional - maybe 3 times a month  . Drug Use: No  . Sexual Activity: Not on file   Other Topics Concern  . Not on file   Social History Narrative   Janice Jennings grew up in [place], [place] and also in [place]. She attending the [employer] and obtained her  Bachelors in Psychology. She then attended [employer] and obtained her Masters in Psychology. 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 She lives at home with her two children, Darius age 52 and Newberry age 85. They have a dog named Coco who she describes as having separation anxiety. Emmarose enjoys activities with the children. They enjoy outdoor activities, watching movies.       Constitutional: Denies fever, malaise, fatigue, headache or abrupt weight changes.  HEENT: Denies eye pain, eye redness, ear pain, ringing in the ears, wax buildup, runny nose, nasal congestion, bloody nose, or sore throat. Respiratory: Denies difficulty breathing, shortness of breath, cough or sputum production.   Cardiovascular: Denies chest pain, chest tightness, palpitations or swelling in the hands or feet.  Gastrointestinal: Denies abdominal pain, bloating, constipation, diarrhea or blood in the stool.  Skin: Denies redness, rashes, lesions or ulcercations.  Neurological: Pt reports burning sensation in feet. Denies dizziness, difficulty with memory, difficulty with speech or problems with balance and coordination.   No other specific complaints in a complete review of systems (except as listed in HPI above).  Objective:   Physical Exam    BP 132/92 mmHg  Pulse 91  Temp(Src) 98.5 F (36.9 C) (Oral)  Wt 207 lb (93.895 kg)  SpO2 98%  LMP 03/09/2015 Wt Readings from Last 3 Encounters:  04/04/15 207 lb (93.895 kg)  03/07/15 209 lb (94.802 kg)  10/20/14 202 lb 6.4 oz (91.808 kg)    General: Appears her stated age, obese in NAD. Skin: Warm, dry and intact. No rashes, lesions or ulcerations noted.  moist, septum midline; Throat/Mouth: Teeth present, mucosa pink and moist, no exudate, lesions or ulcerations noted.  Cardiovascular: Normal rate and rhythm. S1,S2 noted.  No murmur, rubs or gallops noted. No JVD or BLE edema.  Pulmonary/Chest: Normal effort and positive  vesicular breath sounds. No respiratory distress. No wheezes, rales or ronchi noted.  Neurological: Alert and oriented. Sensation intact to 10 gm monofilament.   BMET    Component Value Date/Time   NA 134* 03/07/2015 1442   NA 134* 02/14/2013 1416   K 4.0 03/07/2015 1442   K 4.1 02/14/2013 1416   CL 99 03/07/2015 1442   CL 101 02/14/2013 1416   CO2 28 03/07/2015 1442   CO2 25 02/14/2013 1416   GLUCOSE 235* 03/07/2015 1442   GLUCOSE 349* 02/14/2013 1416   BUN 11 03/07/2015 1442   BUN 15 02/14/2013 1416   CREATININE 0.85 03/07/2015  1442   CREATININE 1.10 02/14/2013 1416   CALCIUM 9.9 03/07/2015 1442   CALCIUM 9.4 02/14/2013 1416   GFRNONAA >60 02/14/2013 1416   GFRAA >60 02/14/2013 1416    Lipid Panel     Component Value Date/Time   CHOL 183 05/26/2014 0955   TRIG 120.0 05/26/2014 0955   HDL 41.50 05/26/2014 0955   CHOLHDL 4 05/26/2014 0955   VLDL 24.0 05/26/2014 0955   LDLCALC 118* 05/26/2014 0955    CBC    Component Value Date/Time   WBC 8.2 03/07/2015 1442   WBC 7.9 02/14/2013 1416   RBC 4.46 03/07/2015 1442   RBC 4.74 02/14/2013 1416   HGB 13.6 03/07/2015 1442   HGB 14.6 02/14/2013 1416   HCT 41.0 03/07/2015 1442   HCT 44.6 02/14/2013 1416   PLT 324.0 03/07/2015 1442   PLT 338 02/14/2013 1416   MCV 91.8 03/07/2015 1442   MCV 94 02/14/2013 1416   MCH 30.9 02/14/2013 1416   MCHC 33.2 03/07/2015 1442   MCHC 32.8 02/14/2013 1416   RDW 13.6 03/07/2015 1442   RDW 13.1 02/14/2013 1416    Hgb A1C Lab Results  Component Value Date   HGBA1C 9.0* 10/20/2014       Assessment & Plan:

## 2015-04-04 NOTE — Patient Instructions (Signed)

## 2015-04-04 NOTE — Progress Notes (Signed)
Pre visit review using our clinic review tool, if applicable. No additional management support is needed unless otherwise documented below in the visit note. 

## 2015-04-04 NOTE — Addendum Note (Signed)
Addended by: Jearld Fenton on: 04/04/2015 04:02 PM   Modules accepted: Miquel Dunn

## 2015-04-04 NOTE — Addendum Note (Signed)
Addended by: Jearld Fenton on: 04/04/2015 05:10 PM   Modules accepted: Miquel Dunn

## 2015-04-04 NOTE — Addendum Note (Signed)
Addended by: Jearld Fenton on: 04/04/2015 05:17 PM   Modules accepted: Miquel Dunn

## 2015-04-05 LAB — BASIC METABOLIC PANEL
BUN: 13 mg/dL (ref 6–23)
CHLORIDE: 99 meq/L (ref 96–112)
CO2: 26 mEq/L (ref 19–32)
Calcium: 9.2 mg/dL (ref 8.4–10.5)
Creatinine, Ser: 0.91 mg/dL (ref 0.40–1.20)
GFR: 85.57 mL/min (ref >60.00)
GLUCOSE: 298 mg/dL — AB (ref 70–99)
POTASSIUM: 4.1 meq/L (ref 3.5–5.1)
Sodium: 132 mEq/L — ABNORMAL LOW (ref 135–145)

## 2015-04-05 LAB — HEMOGLOBIN A1C: HEMOGLOBIN A1C: 8.5 % — AB (ref 4.6–6.5)

## 2015-04-06 MED ORDER — SITAGLIPTIN PHOSPHATE 100 MG PO TABS: 100.0000 mg | ORAL_TABLET | Freq: Every day | ORAL | Status: DC

## 2015-04-06 NOTE — Addendum Note (Signed)
Addended by: Lurlean Nanny on: 04/06/2015 04:52 PM   Modules accepted: Orders

## 2015-05-18 ENCOUNTER — Ambulatory Visit: Payer: Self-pay | Admitting: Internal Medicine

## 2015-05-26 ENCOUNTER — Ambulatory Visit: Payer: BC Managed Care – PPO | Admitting: Internal Medicine

## 2015-06-20 ENCOUNTER — Ambulatory Visit: Payer: BC Managed Care – PPO | Admitting: Internal Medicine

## 2015-06-21 ENCOUNTER — Ambulatory Visit: Payer: BC Managed Care – PPO | Admitting: Internal Medicine

## 2015-06-21 ENCOUNTER — Telehealth: Payer: Self-pay | Admitting: Internal Medicine

## 2015-06-21 NOTE — Telephone Encounter (Signed)
Yes, she needs to followup

## 2015-06-21 NOTE — Telephone Encounter (Signed)
Pt did not come in for their appt today for new patient. Please let me know if pt needs to be contacted immediately for follow up or no follow up needed. Best phone number to contact pt is [phone].

## 2015-06-22 NOTE — Telephone Encounter (Signed)
Contacted pt. She rescheduled for 07/01/15 and is aware.

## 2015-06-25 ENCOUNTER — Other Ambulatory Visit: Payer: Self-pay | Admitting: Internal Medicine

## 2015-06-25 DIAGNOSIS — I10 Essential (primary) hypertension: Secondary | ICD-10-CM

## 2015-06-27 MED ORDER — LISINOPRIL-HYDROCHLOROTHIAZIDE 20-25 MG PO TABS: 1.0000 | ORAL_TABLET | Freq: Every day | ORAL | Status: DC

## 2015-06-27 MED ORDER — SITAGLIPTIN PHOSPHATE 100 MG PO TABS: 100.0000 mg | ORAL_TABLET | Freq: Every day | ORAL | Status: DC

## 2015-06-27 MED ORDER — GABAPENTIN 100 MG PO CAPS: 100.0000 mg | ORAL_CAPSULE | Freq: Every day | ORAL | Status: DC

## 2015-07-01 ENCOUNTER — Encounter: Payer: Self-pay | Admitting: Internal Medicine

## 2015-07-01 ENCOUNTER — Ambulatory Visit (INDEPENDENT_AMBULATORY_CARE_PROVIDER_SITE_OTHER): Payer: BC Managed Care – PPO | Admitting: Internal Medicine

## 2015-07-01 VITALS — BP 118/76 | HR 87 | Temp 98.0°F | Ht 64.25 in | Wt 200.0 lb

## 2015-07-01 DIAGNOSIS — E669 Obesity, unspecified: Secondary | ICD-10-CM

## 2015-07-01 DIAGNOSIS — N951 Menopausal and female climacteric states: Secondary | ICD-10-CM

## 2015-07-01 DIAGNOSIS — N921 Excessive and frequent menstruation with irregular cycle: Secondary | ICD-10-CM

## 2015-07-01 DIAGNOSIS — I10 Essential (primary) hypertension: Secondary | ICD-10-CM | POA: Diagnosis not present

## 2015-07-01 DIAGNOSIS — R232 Flushing: Secondary | ICD-10-CM

## 2015-07-01 DIAGNOSIS — E1140 Type 2 diabetes mellitus with diabetic neuropathy, unspecified: Secondary | ICD-10-CM | POA: Diagnosis not present

## 2015-07-01 LAB — COMPREHENSIVE METABOLIC PANEL
ALK PHOS: 57 U/L (ref 33–115)
ALT: 14 U/L (ref 6–29)
AST: 11 U/L (ref 10–35)
Albumin: 3.8 g/dL (ref 3.6–5.1)
BUN: 9 mg/dL (ref 7–25)
CALCIUM: 9.3 mg/dL (ref 8.6–10.2)
CO2: 25 mmol/L (ref 20–31)
Chloride: 101 mmol/L (ref 98–110)
Creat: 0.88 mg/dL (ref 0.50–1.10)
Glucose, Bld: 231 mg/dL — ABNORMAL HIGH (ref 65–99)
POTASSIUM: 4.3 mmol/L (ref 3.5–5.3)
Sodium: 136 mmol/L (ref 135–146)
TOTAL PROTEIN: 7.2 g/dL (ref 6.1–8.1)
Total Bilirubin: 0.6 mg/dL (ref 0.2–1.2)

## 2015-07-01 LAB — CBC
HEMATOCRIT: 39.1 % (ref 36.0–46.0)
Hemoglobin: 13.5 g/dL (ref 12.0–15.0)
MCH: 31.3 pg (ref 26.0–34.0)
MCHC: 34.5 g/dL (ref 30.0–36.0)
MCV: 90.5 fL (ref 78.0–100.0)
MPV: 10.3 fL (ref 8.6–12.4)
Platelets: 326 10*3/uL (ref 150–400)
RBC: 4.32 MIL/uL (ref 3.87–5.11)
RDW: 13.5 % (ref 11.5–15.5)
WBC: 7.3 10*3/uL (ref 4.0–10.5)

## 2015-07-01 LAB — LIPID PANEL
CHOL/HDL RATIO: 4.4 ratio (ref <=5.0)
CHOLESTEROL: 195 mg/dL (ref 125–200)
HDL: 44 mg/dL — AB (ref >=46)
LDL Cholesterol: 108 mg/dL (ref <130)
Triglycerides: 213 mg/dL — ABNORMAL HIGH (ref <150)
VLDL: 43 mg/dL — ABNORMAL HIGH (ref <30)

## 2015-07-01 LAB — HEMOGLOBIN A1C
Hgb A1c MFr Bld: 8 % — ABNORMAL HIGH (ref <5.7)
MEAN PLASMA GLUCOSE: 183 mg/dL — AB (ref <117)

## 2015-07-01 MED ORDER — GLUCOSE BLOOD VI STRP: 1.0000 | ORAL_STRIP | Freq: Two times a day (BID) | Status: DC

## 2015-07-01 MED ORDER — ONETOUCH ULTRASOFT LANCETS MISC: 1.0000 | Freq: Two times a day (BID) | Status: DC

## 2015-07-01 NOTE — Progress Notes (Signed)
Pre visit review using our clinic review tool, if applicable. No additional management support is needed unless otherwise documented below in the visit note. 

## 2015-07-01 NOTE — Progress Notes (Signed)
HPI  Pt presents to the clinic today to establish care and for management of the conditions listed below. She is transferring care from Lorane Gell, NP.  HTN: She was recently started on Lisinopril-HCT. She feels like it is working well. Her BP today is 118/76.  DM2: Her last A1C in June was 8.5%. She is taking Metformin,Glipizide, Januvia, and Lantus (34 units) as instructed. She denies any adverse effects. She has not been checking her sugars like she should. She does try to consume a low carb diet. She has lost 7 pounds since 03/2015. Her last eye exam was 03/2015. Flu and Pneumovax are UTD. She does c/o a burning sensation in her feet. She reports she still has good sensation in her feet. Her last foot exam was 03/2015. She was started on Neurontin  and she has noticed great improvement.   Flu: 07/2014 Tetanus: 2015 Pap Smear: 2013 Mammogram: 08/2014 Vision Screening: 03/2015 Dentist: biannually   Past Medical History  Diagnosis Date  . Diabetes mellitus without complication   . Migraine     Excedrin Tension HA OTC    Current Outpatient Prescriptions  Medication Sig Dispense Refill  . Biotin 5000 MCG CAPS Take 10,000 mcg by mouth daily.    . Cinnamon 500 MG capsule Take 500 mg by mouth daily.    Marland Kitchen gabapentin (NEURONTIN) 100 MG capsule Take 1 capsule (100 mg total) by mouth at bedtime. 30 capsule 2  . GARCINIA CAMBOGIA-CHROMIUM PO Take 1 capsule by mouth daily.    Marland Kitchen glipiZIDE (GLUCOTROL) 10 MG tablet Take 1 tablet (10 mg total) by mouth 2 (two) times daily before a meal. 180 tablet 1  . glucose blood (ONE TOUCH TEST STRIPS) test strip 1 each by Other route 2 (two) times daily.    . Insulin Glargine (LANTUS SOLOSTAR) 100 UNIT/ML Solostar Pen Inject 33 Units into the skin at bedtime. 15 mL 5  . Insulin Pen Needle 31G X 8 MM MISC Use as directed 100 each 5  . Lancets (ONETOUCH ULTRASOFT) lancets Use as instructed daily 100 each 6  . lisinopril-hydrochlorothiazide (PRINZIDE,ZESTORETIC)  20-25 MG per tablet Take 1 tablet by mouth daily. 90 tablet 0  . metFORMIN (GLUCOPHAGE) 1000 MG tablet Take 1 tablet (1,000 mg total) by mouth 2 (two) times daily with a meal. 180 tablet 1  . Multiple Vitamin (MULTIVITAMIN) tablet Take 1 tablet by mouth daily.    Marland Kitchen OVER THE COUNTER MEDICATION 1 tablet daily. Keratin vitamin    . sitaGLIPtin (JANUVIA) 100 MG tablet Take 1 tablet (100 mg total) by mouth daily. 30 tablet 2   No current facility-administered medications for this visit.    No Known Allergies  Family History  Problem Relation Age of Onset  . Diabetes Father   . Hyperlipidemia Father   . Hypertension Father   . Cancer Maternal Aunt 36    leukemia - died  . Diabetes Maternal Grandmother   . Cancer Maternal Grandmother     breast cancer  . Stroke Maternal Grandmother   . Diabetes Daughter     Pre-diabetes  . Diabetes Maternal Grandfather   . Hyperlipidemia Maternal Grandfather   . Hypertension Maternal Grandfather   . Cancer Maternal Aunt 50    breast cancer - died    Social History   Social History  . Marital Status: Single    Spouse Name: N/A  . Number of Children: 2  . Years of Education: 18   Occupational History  . 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 Smokeless tobacco: Never Used  . Alcohol Use: 0.0 oz/week    0 Standard drinks or equivalent per week     Comment: Occasional - maybe 3 times a month  . Drug Use: No  . Sexual Activity: Not on file   Other Topics Concern  . Not on file   Social History Narrative   Janice Jennings grew up in [place], [place] and also in [place]. She attending the [employer] and obtained her Bachelors in Psychology. She then attended [employer] and obtained her Masters in Psychology. 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 She lives at home with her two children, [name]. They have a dog named  Coco who she describes as having separation anxiety. Franchelle enjoys activities with the children. They enjoy outdoor activities, watching movies.      ROS:  Constitutional: Pt reports hot flashes. Denies fever, malaise, fatigue, headache or abrupt weight changes.  HEENT: Denies eye pain, eye redness, ear pain, ringing in the ears, wax buildup, runny nose, nasal congestion, bloody nose, or sore throat. Respiratory: Denies difficulty breathing, shortness of breath, cough or sputum production.   Cardiovascular: Denies chest pain, chest tightness, palpitations or swelling in the hands or feet.  Gastrointestinal: Denies abdominal pain, bloating, constipation, diarrhea or blood in the stool.  GU: Pt reports irregular periods. Denies frequency, urgency, pain with urination, blood in urine, odor or discharge. Musculoskeletal: Denies decrease in range of motion, difficulty with gait, muscle pain or joint pain and swelling.  Skin: Denies redness, rashes, lesions or ulcercations.  Neurological: Denies dizziness, difficulty with memory, difficulty with speech or problems with balance and coordination.  Psych: Denies anxiety, depression, SI/HI.  No other specific complaints in a complete review of systems (except as listed in HPI above).  PE:  BP 118/76 mmHg  Pulse 87  Temp(Src) 98 F (36.7 C) (Oral)  Ht 5' 4.25" (1.632 m)  Wt 200 lb (90.719 kg)  BMI 34.06 kg/m2  SpO2 99%  LMP 06/11/2015  Wt Readings from Last 3 Encounters:  07/01/15 200 lb (90.719 kg)  04/04/15 207 lb (93.895 kg)  03/07/15 209 lb (94.802 kg)    General: Appears her stated age, obese in NAD. HEENT: Head: normal shape and size; Eyes: sclera white, no icterus, conjunctiva pink, PERRLA and EOMs intact;  Cardiovascular: Normal rate and rhythm. S1,S2 noted.  No murmur, rubs or gallops noted. No JVD or BLE edema. No carotid bruits noted. Pulmonary/Chest: Normal effort and positive vesicular breath sounds. No respiratory distress.  No wheezes, rales or ronchi noted.  Abdomen: Soft and nontender. Normal bowel sounds, no bruits noted. No distention or masses noted. Liver, spleen and kidneys non palpable. Musculoskeletal: Normal range of motion. Strength 5/5 BUE/BLE. No signs of joint swelling. No difficulty with gait.  Neurological: Alert and oriented. Cranial nerves II-XII grossly intact. Coordination normal.  Psychiatric: Mood and affect normal. Behavior is normal. Judgment and thought content normal.     BMET    Component Value Date/Time   NA 132* 04/04/2015 1539   NA 134* 02/14/2013 1416   K 4.1 04/04/2015 1539   K 4.1 02/14/2013 1416   CL 99 04/04/2015 1539   CL 101 02/14/2013 1416   CO2 26 04/04/2015 1539   CO2 25 02/14/2013 1416   GLUCOSE 298* 04/04/2015 1539   GLUCOSE 349* 02/14/2013  1416   BUN 13 04/04/2015 1539   BUN 15 02/14/2013 1416   CREATININE 0.91 04/04/2015 1539   CREATININE 1.10 02/14/2013 1416   CALCIUM 9.2 04/04/2015 1539   CALCIUM 9.4 02/14/2013 1416   GFRNONAA >60 02/14/2013 1416   GFRAA >60 02/14/2013 1416    Lipid Panel     Component Value Date/Time   CHOL 183 05/26/2014 0955   TRIG 120.0 05/26/2014 0955   HDL 41.50 05/26/2014 0955   CHOLHDL 4 05/26/2014 0955   VLDL 24.0 05/26/2014 0955   LDLCALC 118* 05/26/2014 0955    CBC    Component Value Date/Time   WBC 8.2 03/07/2015 1442   WBC 7.9 02/14/2013 1416   RBC 4.46 03/07/2015 1442   RBC 4.74 02/14/2013 1416   HGB 13.6 03/07/2015 1442   HGB 14.6 02/14/2013 1416   HCT 41.0 03/07/2015 1442   HCT 44.6 02/14/2013 1416   PLT 324.0 03/07/2015 1442   PLT 338 02/14/2013 1416   MCV 91.8 03/07/2015 1442   MCV 94 02/14/2013 1416   MCH 30.9 02/14/2013 1416   MCHC 33.2 03/07/2015 1442   MCHC 32.8 02/14/2013 1416   RDW 13.6 03/07/2015 1442   RDW 13.1 02/14/2013 1416    Hgb A1C Lab Results  Component Value Date   HGBA1C 8.5* 04/04/2015     Assessment and Plan:  Hot Flashes and Menorrhagia:  Sounds like  perimenopausal Will check FSH and LH today She will make an appt for her physical exam, where we will perform a pap smear May need ultrasound of abdomen She will try Black Cohosh OTC  Make an appt for your annual exam

## 2015-07-01 NOTE — Patient Instructions (Signed)
Diabetes and Exercise Diabetes mellitus is a common, chronic disease, in which the pancreas is unable to adequately control blood glucose (sugar) levels. There are 2 types of diabetes. Type 1 diabetes patients are unable to produce insulin, a hormone that causes sugar in the blood to be stored in the body. People with type 1 diabetes may compensate by giving themselves injections of insulin. Type 2 diabetes involves not producing adequate amounts of insulin to control blood glucose levels. People with type 2 diabetes control their blood glucose by monitoring their food intake or by taking medicine. Exercise is an important part of diabetes treatment. During exercise, the muscles use a greater amount of glucose from the blood for energy. This lowers your blood glucose, which is the same effect you would get from taking insulin. It has been shown that endurance athletes are more sensitive to insulin than inactive people. SYMPTOMS  Many people with a mild case of diabetes have no symptoms. However, if left uncontrolled, diabetes can lead to several complications that could be prevented with treatment of the disease. General symptoms of diabetes include:   Frequent urination (polyuria).  Frequent thirst and drinking (polydipsia).  Increased food consumption (polyphagia).  Fatigue.  Poor exercise performance.  Blurred vision.  Inflammation of the vagina (vaginitis) caused by fungal infections.  Skin infections (uncommon).  Numbness in the feet, caused by nerve injury.  Kidney disease. CAUSES  The cause of most cases of diabetes is unknown. In children, diabetes is often due to an autoimmune response to the cells in the pancreas that make insulin. It is also linked with other diseases, such as cystic fibrosis. Diabetes may have a genetic link. PREVENTION  Athletes should strive to begin exercise with blood glucose in a well-controlled state.  Feet should always be kept clean and  dry.  Activities in which low blood sugar levels cannot be treated easily (scuba diving, rock climbing, swimming) should be avoided.  Anticipate alterations in diet or training to avoid low blood sugar (hypoglycemia) and high blood sugar (hyperglycemia).  Athletes should try to increase sugar consumption after strenuous exercise to avoid hypoglycemia.  Short-acting insulin should not be injected into an actively exercising muscle. The athlete should rest the injection site for about 1 hour after exercise.  Patients with diabetes should get routine checkups of the feet to prevent complications. PROGNOSIS  Exercise provides many benefits to the person with diabetes:   Reduced body fat.  Lower blood pressure.  Often, reduced need for medicines.  Improved exercise tolerance.  Lower insulin levels.  Weight loss.  Improved lipid profile (decreased cholesterol and low-density lipoproteins). RELATED COMPLICATIONS  If performed incorrectly, exercise can result in complications of diabetes:   Poor control of blood sugar, when exercise is performed at the wrong time.  Increase in renal disease, from loss of body fluids (dehydration).  Increased risk of nerve injury (neuropathy) when performing exercises that increase foot injury.  Increased risk of eye problems when performing activities that involve breath holding or lowering or jarring the head.  Increased risk of sudden death from exercise in patients with heart disease.  Worsening of hypertension with heavy lifting (more than 10 lb/4.5 kg). Altered blood glucose and insulin dose as a result of mild illness that produces loss of appetite.  Altered uptake of insulin after injection when insulin injection site is changed. NOTE: Exercise can lower blood glucose effectively, but the effects are short-lasting (no more than a couple of days). Exercise has been shown to   improve your sensitivity to insulin. This may alter how your body  responds to a given dose of injected insulin. It is important for every patient with diabetes to know how his or her body may react to exercise, and to adjust insulin dosages accordingly. TREATMENT  Eat about 1 to 3 hours before exercise.  Check blood glucose immediately before and after exercise.  Stop exercise if blood glucose is more than 250 mg/dL.  Stop exercise if blood glucose is less than 100 mg/dL.  Do not exercise within 1 hour of an insulin injection.  Be prepared to treat low blood glucose while exercising. Keep some sugar product with you, such as a candy bar.  For prolonged exercise, use a sports drink to maintain your glucose level.  Replace used-up glucose in the body after exercise.  Consume fluids during and after exercise to avoid dehydration. SEEK MEDICAL CARE IF:  You have vision changes after a run.  You notice a loss of sensation in your feet after exercise.  You have increased numbness, tingling, or pins and needles sensations after exercise.  You have chest pain during or after exercise.  You have a fast, irregular heartbeat (palpitations) during or after exercise.  Your exercise tolerance gets worse.  You have fainting or dizzy spells for brief periods during or after exercise. Document Released: 10/15/2005 Document Revised: 03/01/2014 Document Reviewed: 01/27/2009 ExitCare Patient Information 2015 [employer]. This information is not intended to replace advice given to you by your health care provider. Make sure you discuss any questions you have with your health care provider.  

## 2015-07-02 LAB — LUTEINIZING HORMONE: LH: 5 m[IU]/mL

## 2015-07-02 LAB — FOLLICLE STIMULATING HORMONE: FSH: 4.6 m[IU]/mL

## 2015-07-02 NOTE — Assessment & Plan Note (Signed)
BP well controlled on current therapy Will check CBC and CMET today Will obtain ECG at her next visit

## 2015-07-02 NOTE — Assessment & Plan Note (Signed)
Encouraged her to work on diet and exercise 

## 2015-07-02 NOTE — Assessment & Plan Note (Signed)
Will check A1C today No microalbumin secondary to ACEI therapy Foot exam today Flu and Pneumovax UTD Eye exam UTD Encouraged her to consume a low carb diet Will continue current medications at this time unless instructed otherwise

## 2015-07-05 NOTE — Addendum Note (Signed)
Addended by: Lindalou Hose Y on: 07/05/2015 03:00 PM   Modules accepted: Medications

## 2015-07-07 NOTE — Addendum Note (Signed)
Addended by: Jearld Fenton on: 07/07/2015 12:15 PM   Modules accepted: Miquel Dunn

## 2015-07-19 ENCOUNTER — Encounter: Payer: Self-pay | Admitting: Internal Medicine

## 2015-07-19 ENCOUNTER — Ambulatory Visit: Payer: Self-pay | Admitting: Internal Medicine

## 2015-07-19 ENCOUNTER — Other Ambulatory Visit (HOSPITAL_COMMUNITY)
Admission: RE | Admit: 2015-07-19 | Discharge: 2015-07-19 | Disposition: A | Discharge status: Home / Self Care | Payer: BC Managed Care – PPO | Source: Ambulatory Visit | Attending: Internal Medicine | Admitting: Internal Medicine

## 2015-07-19 ENCOUNTER — Ambulatory Visit (INDEPENDENT_AMBULATORY_CARE_PROVIDER_SITE_OTHER): Payer: BC Managed Care – PPO | Admitting: Internal Medicine

## 2015-07-19 VITALS — BP 120/84 | HR 92 | Temp 98.3°F | Ht 64.25 in | Wt 200.5 lb

## 2015-07-19 DIAGNOSIS — N921 Excessive and frequent menstruation with irregular cycle: Secondary | ICD-10-CM

## 2015-07-19 DIAGNOSIS — Z124 Encounter for screening for malignant neoplasm of cervix: Secondary | ICD-10-CM | POA: Diagnosis not present

## 2015-07-19 DIAGNOSIS — Z23 Encounter for immunization: Secondary | ICD-10-CM | POA: Diagnosis not present

## 2015-07-19 DIAGNOSIS — Z01419 Encounter for gynecological examination (general) (routine) without abnormal findings: Secondary | ICD-10-CM | POA: Diagnosis present

## 2015-07-19 DIAGNOSIS — Z1151 Encounter for screening for human papillomavirus (HPV): Secondary | ICD-10-CM | POA: Insufficient documentation

## 2015-07-19 DIAGNOSIS — Z0000 Encounter for general adult medical examination without abnormal findings: Secondary | ICD-10-CM

## 2015-07-19 NOTE — Progress Notes (Signed)
Subjective:    Patient ID: Janice Jennings, female    DOB: 1971-01-31, 44 y.o.   MRN: [id number]  HPI  Pt presents to the clinic today for her annual exam.  Flu: 07/2014 Tetanus: 04/2014 Pneumovax: 04/2014 Prevnar: never Pap Smear: 2013 Mammogram: 08/2014 Vision Screening: 03/2015 Dentist: biannually  Diet: She consumes a low carb diet Exercise: She walks for about 30 minutes a few days per week  She has been having menorrhagia. We checked her FSH/LH which did not show that she was menopausal. She is not sure what else to do.  Review of Systems      Past Medical History  Diagnosis Date  . Diabetes mellitus without complication   . Migraine     Excedrin Tension HA OTC    Current Outpatient Prescriptions  Medication Sig Dispense Refill  . Biotin 5000 MCG CAPS Take 10,000 mcg by mouth daily.    . Cinnamon 500 MG capsule Take 500 mg by mouth daily.    Marland Kitchen gabapentin (NEURONTIN) 100 MG capsule Take 1 capsule (100 mg total) by mouth at bedtime. 30 capsule 2  . GARCINIA CAMBOGIA-CHROMIUM PO Take 1 capsule by mouth daily.    Marland Kitchen glipiZIDE (GLUCOTROL) 10 MG tablet Take 1 tablet (10 mg total) by mouth 2 (two) times daily before a meal. 180 tablet 1  . glucose blood (ONE TOUCH TEST STRIPS) test strip 1 each by Other route 2 (two) times daily. 200 each 4  . Insulin Glargine (LANTUS SOLOSTAR) 100 UNIT/ML Solostar Pen Inject 38 Units into the skin daily at 10 pm.    . Insulin Pen Needle 31G X 8 MM MISC Use as directed 100 each 5  . Lancets (ONETOUCH ULTRASOFT) lancets 1 each by Other route 2 (two) times daily. 200 each 3  . lisinopril-hydrochlorothiazide (PRINZIDE,ZESTORETIC) 20-25 MG per tablet Take 1 tablet by mouth daily. 90 tablet 0  . metFORMIN (GLUCOPHAGE) 1000 MG tablet Take 1 tablet (1,000 mg total) by mouth 2 (two) times daily with a meal. 180 tablet 1  . Multiple Vitamin (MULTIVITAMIN) tablet Take 1 tablet by mouth daily.    Marland Kitchen OVER THE COUNTER MEDICATION 1 tablet daily. Keratin  vitamin    . sitaGLIPtin (JANUVIA) 100 MG tablet Take 1 tablet (100 mg total) by mouth daily. 30 tablet 2   No current facility-administered medications for this visit.    No Known Allergies  Family History  Problem Relation Age of Onset  . Diabetes Father   . Hyperlipidemia Father   . Hypertension Father   . Cancer Maternal Aunt 44    leukemia - died  . Diabetes Maternal Grandmother   . Cancer Maternal Grandmother     breast cancer  . Stroke Maternal Grandmother   . Diabetes Daughter     Pre-diabetes  . Diabetes Maternal Grandfather   . Hyperlipidemia Maternal Grandfather   . Hypertension Maternal Grandfather   . Cancer Maternal Aunt 50    breast cancer - died    Social History   Social History  . Marital Status: Single    Spouse Name: N/A  . Number of Children: 2  . Years of Education: 18   Occupational History  . 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 Smokeless tobacco: Never Used  . Alcohol Use: 0.0 oz/week    0 Standard drinks or equivalent per week     Comment: Occasional - maybe 3 times a  month  . Drug Use: No  . Sexual Activity: Yes   Other Topics Concern  . Not on file   Social History Narrative   Janice Jennings grew up in [place], [place] and also in [place]. She attending the [employer] and obtained her Bachelors in Psychology. She then attended [employer] and obtained her Masters in Psychology. 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 She lives at home with her two children, Janice Jennings age 3 and Janice Jennings age 35. They have a dog named Janice Jennings who she describes as having separation anxiety. Janice Jennings enjoys activities with the children. They enjoy outdoor activities, watching movies.       Constitutional: Denies fever, malaise, fatigue, headache or abrupt weight changes.  HEENT: Denies eye pain, eye redness, ear pain, ringing in the ears, wax buildup, runny  nose, nasal congestion, bloody nose, or sore throat. Respiratory: Denies difficulty breathing, shortness of breath, cough or sputum production.   Cardiovascular: Denies chest pain, chest tightness, palpitations or swelling in the hands or feet.  Gastrointestinal: Denies abdominal pain, bloating, constipation, diarrhea or blood in the stool.  GU: Pt reports heavy, irregular periods. Denies urgency, frequency, pain with urination, burning sensation, blood in urine, odor or discharge. Musculoskeletal: Denies decrease in range of motion, difficulty with gait, muscle pain or joint pain and swelling.  Skin: Denies redness, rashes, lesions or ulcercations.  Neurological: Denies dizziness, difficulty with memory, difficulty with speech or problems with balance and coordination.  Psych: Denies anxiety, depression, SI/HI.  No other specific complaints in a complete review of systems (except as listed in HPI above).  Objective:   Physical Exam   BP 118/84 mmHg  Pulse 92  Temp(Src) 98.3 F (36.8 C) (Oral)  Ht 5' 4.25" (1.632 m)  Wt 200 lb 8 oz (90.946 kg)  BMI 34.15 kg/m2  SpO2 98%  LMP 06/28/2015 Wt Readings from Last 3 Encounters:  07/19/15 200 lb 8 oz (90.946 kg)  07/01/15 200 lb (90.719 kg)  04/04/15 207 lb (93.895 kg)    General: Appears her stated age, obese in NAD. Skin: Warm, dry and intact. No rashes, lesions or ulcerations noted. HEENT: Head: normal shape and size; Eyes: sclera white, no icterus, conjunctiva pink, PERRLA and EOMs intact; Ears: Tm's gray and intact, normal light reflex;Throat/Mouth: Teeth present, mucosa pink and moist, no exudate, lesions or ulcerations noted.  Neck:  Neck supple, trachea midline. No masses, lumps or thyromegaly present.  Cardiovascular: Normal rate and rhythm. S1,S2 noted.  No murmur, rubs or gallops noted. No JVD or BLE edema. No carotid bruits noted. Pulmonary/Chest: Normal effort and positive vesicular breath sounds. No respiratory distress. No  wheezes, rales or ronchi noted.  Abdomen: Soft and nontender. Normal bowel sounds, no bruits noted. No distention or masses noted. Liver, spleen and kidneys non palpable. Pelvic: Normal female anatomy. No CMT. No discharge noted. Adnexa non palpable.  Musculoskeletal: Normal range of motion. Strength 5/5 BUE/BLE. No signs of joint swelling. No difficulty with gait.  Neurological: Alert and oriented. Cranial nerves II-XII grossly intact. Coordination normal.  Psychiatric: Mood and affect normal. Behavior is normal. Judgment and thought content normal.     BMET    Component Value Date/Time   NA 136 07/01/2015 1559   NA 134* 02/14/2013 1416   K 4.3 07/01/2015 1559   K 4.1 02/14/2013 1416   CL 101 07/01/2015 1559   CL 101 02/14/2013 1416   CO2 25 07/01/2015 1559   CO2 25 02/14/2013  1416   GLUCOSE 231* 07/01/2015 1559   GLUCOSE 349* 02/14/2013 1416   BUN 9 07/01/2015 1559   BUN 15 02/14/2013 1416   CREATININE 0.88 07/01/2015 1559   CREATININE 0.91 04/04/2015 1539   CREATININE 1.10 02/14/2013 1416   CALCIUM 9.3 07/01/2015 1559   CALCIUM 9.4 02/14/2013 1416   GFRNONAA >60 02/14/2013 1416   GFRAA >60 02/14/2013 1416    Lipid Panel     Component Value Date/Time   CHOL 195 07/01/2015 1559   TRIG 213* 07/01/2015 1559   HDL 44* 07/01/2015 1559   CHOLHDL 4.4 07/01/2015 1559   VLDL 43* 07/01/2015 1559   LDLCALC 108 07/01/2015 1559    CBC    Component Value Date/Time   WBC 7.3 07/01/2015 1559   WBC 7.9 02/14/2013 1416   RBC 4.32 07/01/2015 1559   RBC 4.74 02/14/2013 1416   HGB 13.5 07/01/2015 1559   HGB 14.6 02/14/2013 1416   HCT 39.1 07/01/2015 1559   HCT 44.6 02/14/2013 1416   PLT 326 07/01/2015 1559   PLT 338 02/14/2013 1416   MCV 90.5 07/01/2015 1559   MCV 94 02/14/2013 1416   MCH 31.3 07/01/2015 1559   MCH 30.9 02/14/2013 1416   MCHC 34.5 07/01/2015 1559   MCHC 32.8 02/14/2013 1416   RDW 13.5 07/01/2015 1559   RDW 13.1 02/14/2013 1416    Hgb A1C Lab Results    Component Value Date   HGBA1C 8.0* 07/01/2015        Assessment & Plan:   Preventative Health Maintenance:  Encouraged her to work on diet and exercise Flu shot today Prevnar today (DM 2) Tetanus and Pneumovax UTD ECG today- left ventricular hypertrophy She will schedule mammogram for 08/2015 Encouraged her to see an eye doctor and dentist at least annually  Menorrhagia with irregular cycle:  Will obtain ultrasound of pelvis/transvaginal  RTC in 3 months to follow up chronic conditions

## 2015-07-19 NOTE — Patient Instructions (Signed)

## 2015-07-19 NOTE — Progress Notes (Signed)
Pre visit review using our clinic review tool, if applicable. No additional management support is needed unless otherwise documented below in the visit note. 

## 2015-07-19 NOTE — Addendum Note (Signed)
Addended by: Lurlean Nanny on: 07/19/2015 04:42 PM   Modules accepted: Orders

## 2015-07-19 NOTE — Addendum Note (Signed)
Addended by: Jearld Fenton on: 07/19/2015 02:30 PM   Modules accepted: Miquel Dunn

## 2015-07-22 ENCOUNTER — Ambulatory Visit
Admission: RE | Admit: 2015-07-22 | Discharge: 2015-07-22 | Disposition: A | Discharge status: Home / Self Care | Payer: BC Managed Care – PPO | Source: Ambulatory Visit | Attending: Internal Medicine | Admitting: Internal Medicine

## 2015-07-22 ENCOUNTER — Encounter: Payer: BC Managed Care – PPO | Admitting: Internal Medicine

## 2015-07-22 DIAGNOSIS — D259 Leiomyoma of uterus, unspecified: Secondary | ICD-10-CM | POA: Insufficient documentation

## 2015-07-22 DIAGNOSIS — Z0000 Encounter for general adult medical examination without abnormal findings: Secondary | ICD-10-CM

## 2015-07-22 DIAGNOSIS — N921 Excessive and frequent menstruation with irregular cycle: Secondary | ICD-10-CM | POA: Insufficient documentation

## 2015-07-22 LAB — CYTOLOGY - PAP

## 2015-08-18 ENCOUNTER — Encounter: Payer: Self-pay | Admitting: Internal Medicine

## 2015-09-04 ENCOUNTER — Encounter: Payer: Self-pay | Admitting: Internal Medicine

## 2015-09-05 MED ORDER — METFORMIN HCL 1000 MG PO TABS: 1000.0000 mg | ORAL_TABLET | Freq: Two times a day (BID) | ORAL | Status: DC

## 2015-09-23 ENCOUNTER — Encounter: Payer: Self-pay | Admitting: Internal Medicine

## 2015-09-23 DIAGNOSIS — I10 Essential (primary) hypertension: Secondary | ICD-10-CM

## 2015-09-26 MED ORDER — GABAPENTIN 100 MG PO CAPS: 100.0000 mg | ORAL_CAPSULE | Freq: Every day | ORAL | Status: DC

## 2015-09-26 MED ORDER — LISINOPRIL-HYDROCHLOROTHIAZIDE 20-25 MG PO TABS: 1.0000 | ORAL_TABLET | Freq: Every day | ORAL | Status: DC

## 2015-09-26 MED ORDER — SITAGLIPTIN PHOSPHATE 100 MG PO TABS: 100.0000 mg | ORAL_TABLET | Freq: Every day | ORAL | Status: DC

## 2015-10-20 ENCOUNTER — Encounter: Payer: Self-pay | Admitting: Internal Medicine

## 2015-10-20 ENCOUNTER — Ambulatory Visit (INDEPENDENT_AMBULATORY_CARE_PROVIDER_SITE_OTHER): Payer: BC Managed Care – PPO | Admitting: Internal Medicine

## 2015-10-20 VITALS — BP 118/80 | HR 90 | Temp 98.3°F | Wt 196.0 lb

## 2015-10-20 DIAGNOSIS — E1141 Type 2 diabetes mellitus with diabetic mononeuropathy: Secondary | ICD-10-CM | POA: Diagnosis not present

## 2015-10-20 DIAGNOSIS — I10 Essential (primary) hypertension: Secondary | ICD-10-CM | POA: Diagnosis not present

## 2015-10-20 DIAGNOSIS — Z794 Long term (current) use of insulin: Secondary | ICD-10-CM | POA: Diagnosis not present

## 2015-10-20 LAB — COMPREHENSIVE METABOLIC PANEL
ALK PHOS: 54 U/L (ref 39–117)
ALT: 14 U/L (ref 0–35)
AST: 13 U/L (ref 0–37)
Albumin: 3.7 g/dL (ref 3.5–5.2)
BILIRUBIN TOTAL: 0.5 mg/dL (ref 0.2–1.2)
BUN: 14 mg/dL (ref 6–23)
CALCIUM: 9.8 mg/dL (ref 8.4–10.5)
CO2: 30 meq/L (ref 19–32)
CREATININE: 0.94 mg/dL (ref 0.40–1.20)
Chloride: 103 mEq/L (ref 96–112)
GFR: 82.23 mL/min (ref >60.00)
GLUCOSE: 169 mg/dL — AB (ref 70–99)
Potassium: 4 mEq/L (ref 3.5–5.1)
Sodium: 139 mEq/L (ref 135–145)
TOTAL PROTEIN: 7.4 g/dL (ref 6.0–8.3)

## 2015-10-20 LAB — HEMOGLOBIN A1C: Hgb A1c MFr Bld: 6.7 % — ABNORMAL HIGH (ref 4.6–6.5)

## 2015-10-20 MED ORDER — GABAPENTIN 100 MG PO CAPS: 100.0000 mg | ORAL_CAPSULE | Freq: Three times a day (TID) | ORAL | Status: DC

## 2015-10-20 NOTE — Assessment & Plan Note (Signed)
Controlled on Lisinopril HCT CMET today

## 2015-10-20 NOTE — Addendum Note (Signed)
Addended by: Marchia Bond on: 10/20/2015 03:57 PM   Modules accepted: Miquel Dunn

## 2015-10-20 NOTE — Assessment & Plan Note (Addendum)
Will check A1C today No microalbumin secondary to ACEI Lipid Profile done 06/2015 Flu and Pneumovax UTD Eye exam UTD Foot exam today Increase Gabapentin to 100 mg TID Continue current medications at this time

## 2015-10-20 NOTE — Progress Notes (Signed)
HPI  Pt presents to the clinic today for 3 month follow up of chronic conditions.  HTN: She is taking Lisinopril-HCT. She feels like it is working well. Her BP today is 118/80. ECG fro m9/2016 reviewed  DM2: Her last A1C in 06/2015 was 8.%. She is taking Metformin,Glipizide, Januvia, and Lantus (38 units) as instructed. She does tell me that the Lantus will no longer be covered after January 1. She denies any adverse effects. She has not been checking her sugars like she should. She does try to consume a low carb diet. She has lost 4 pounds since 06/2015. Her last eye exam was 03/2015. Flu and Pneumovax are UTD. She does c/o a burning sensation in her feet. She reports she still has good sensation in her feet. Her last foot exam was 03/2015. She is taking Neurontin but reports she does not feel like it is working as well as it used to.     Past Medical History  Diagnosis Date  . Diabetes mellitus without complication ([place])   . Migraine     Excedrin Tension HA OTC    Current Outpatient Prescriptions  Medication Sig Dispense Refill  . Biotin 5000 MCG CAPS Take 10,000 mcg by mouth daily.    . Cinnamon 500 MG capsule Take 500 mg by mouth daily.    Marland Kitchen gabapentin (NEURONTIN) 100 MG capsule Take 1 capsule (100 mg total) by mouth at bedtime. 90 capsule 1  . GARCINIA CAMBOGIA-CHROMIUM PO Take 1 capsule by mouth daily.    Marland Kitchen glipiZIDE (GLUCOTROL) 10 MG tablet Take 1 tablet (10 mg total) by mouth 2 (two) times daily before a meal. 180 tablet 1  . glucose blood (ONE TOUCH TEST STRIPS) test strip 1 each by Other route 2 (two) times daily. 200 each 4  . Insulin Glargine (LANTUS SOLOSTAR) 100 UNIT/ML Solostar Pen Inject 38 Units into the skin daily at 10 pm.    . Insulin Pen Needle 31G X 8 MM MISC Use as directed 100 each 5  . Lancets (ONETOUCH ULTRASOFT) lancets 1 each by Other route 2 (two) times daily. 200 each 3  . lisinopril-hydrochlorothiazide (PRINZIDE,ZESTORETIC) 20-25 MG tablet Take 1 tablet by  mouth daily. 90 tablet 2  . metFORMIN (GLUCOPHAGE) 1000 MG tablet Take 1 tablet (1,000 mg total) by mouth 2 (two) times daily with a meal. 180 tablet 1  . Multiple Vitamin (MULTIVITAMIN) tablet Take 1 tablet by mouth daily.    Marland Kitchen OVER THE COUNTER MEDICATION 1 tablet daily. Keratin vitamin    . sitaGLIPtin (JANUVIA) 100 MG tablet Take 1 tablet (100 mg total) by mouth daily. 90 tablet 1   No current facility-administered medications for this visit.    No Known Allergies  Family History  Problem Relation Age of Onset  . Diabetes Father   . Hyperlipidemia Father   . Hypertension Father   . Cancer Maternal Aunt 78    leukemia - died  . Diabetes Maternal Grandmother   . Cancer Maternal Grandmother     breast cancer  . Stroke Maternal Grandmother   . Diabetes Daughter     Pre-diabetes  . Diabetes Maternal Grandfather   . Hyperlipidemia Maternal Grandfather   . Hypertension Maternal Grandfather   . Cancer Maternal Aunt 50    breast cancer - died    Social History   Social History  . Marital Status: Single    Spouse Name: N/A  . Number of Children: 2  . Years of Education: 68  Occupational History  . 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 Smokeless tobacco: Never Used  . Alcohol Use: 0.0 oz/week    0 Standard drinks or equivalent per week     Comment: Occasional - maybe 3 times a month  . Drug Use: No  . Sexual Activity: Yes   Other Topics Concern  . Not on file   Social History Narrative   Rikesha grew up in [place], [place] and also in [place]. She attending the [employer] and obtained her Bachelors in Psychology. She then attended [employer] and obtained her Masters in Psychology. 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 She lives at home with her two children, Darius age 21 and Dunlap age 25. They have a dog named Coco who she describes as having separation  anxiety. Kebrina enjoys activities with the children. They enjoy outdoor activities, watching movies.      ROS:  Constitutional:  Denies fever, malaise, fatigue, headache or abrupt weight changes.  HEENT: Denies eye pain, eye redness, ear pain, ringing in the ears, wax buildup, runny nose, nasal congestion, bloody nose, or sore throat. Respiratory: Denies difficulty breathing, shortness of breath, cough or sputum production.   Cardiovascular: Denies chest pain, chest tightness, palpitations or swelling in the hands or feet.  Gastrointestinal: Denies abdominal pain, bloating, constipation, diarrhea or blood in the stool.  Skin: Denies redness, rashes, lesions or ulcercations.  Neurological: Pt reports numbness and tingling in her feet. Denies dizziness, difficulty with memory, difficulty with speech or problems with balance and coordination.  Psych: Denies anxiety, depression, SI/HI.  No other specific complaints in a complete review of systems (except as listed in HPI above).  PE:  BP 118/80 mmHg  Pulse 90  Temp(Src) 98.3 F (36.8 C) (Oral)  Wt 196 lb (88.905 kg)  SpO2 98%  Wt Readings from Last 3 Encounters:  10/20/15 196 lb (88.905 kg)  07/19/15 200 lb 8 oz (90.946 kg)  07/01/15 200 lb (90.719 kg)    General: Appears her stated age, obese in NAD. HEENT: Head: normal shape and size; Eyes: sclera white, no icterus, conjunctiva pink, PERRLA and EOMs intact;  Cardiovascular: Normal rate and rhythm. S1,S2 noted.  No murmur, rubs or gallops noted. No JVD or BLE edema. No carotid bruits noted. Pulmonary/Chest: Normal effort and positive vesicular breath sounds. No respiratory distress. No wheezes, rales or ronchi noted.  Neurological: Alert and oriented. Sensation intact to bilateral feet. Psychiatric: Mood and affect normal. Behavior is normal. Judgment and thought content normal.     BMET    Component Value Date/Time   NA 136 07/01/2015 1559   NA 134* 02/14/2013 1416   K 4.3  07/01/2015 1559   K 4.1 02/14/2013 1416   CL 101 07/01/2015 1559   CL 101 02/14/2013 1416   CO2 25 07/01/2015 1559   CO2 25 02/14/2013 1416   GLUCOSE 231* 07/01/2015 1559   GLUCOSE 349* 02/14/2013 1416   BUN 9 07/01/2015 1559   BUN 15 02/14/2013 1416   CREATININE 0.88 07/01/2015 1559   CREATININE 0.91 04/04/2015 1539   CREATININE 1.10 02/14/2013 1416   CALCIUM 9.3 07/01/2015 1559   CALCIUM 9.4 02/14/2013 1416   GFRNONAA >60 02/14/2013 1416   GFRAA >60 02/14/2013 1416    Lipid Panel     Component Value Date/Time   CHOL 195 07/01/2015 1559   TRIG 213* 07/01/2015  1559   HDL 44* 07/01/2015 1559   CHOLHDL 4.4 07/01/2015 1559   VLDL 43* 07/01/2015 1559   LDLCALC 108 07/01/2015 1559    CBC    Component Value Date/Time   WBC 7.3 07/01/2015 1559   WBC 7.9 02/14/2013 1416   RBC 4.32 07/01/2015 1559   RBC 4.74 02/14/2013 1416   HGB 13.5 07/01/2015 1559   HGB 14.6 02/14/2013 1416   HCT 39.1 07/01/2015 1559   HCT 44.6 02/14/2013 1416   PLT 326 07/01/2015 1559   PLT 338 02/14/2013 1416   MCV 90.5 07/01/2015 1559   MCV 94 02/14/2013 1416   MCH 31.3 07/01/2015 1559   MCH 30.9 02/14/2013 1416   MCHC 34.5 07/01/2015 1559   MCHC 32.8 02/14/2013 1416   RDW 13.5 07/01/2015 1559   RDW 13.1 02/14/2013 1416    Hgb A1C Lab Results  Component Value Date   HGBA1C 8.0* 07/01/2015     Assessment and Plan:  RTC in 3 months to follow up chronic conditions

## 2015-10-20 NOTE — Addendum Note (Signed)
Addended by: Jearld Fenton on: 10/20/2015 10:12 AM   Modules accepted: Orders, SmartSet

## 2015-10-20 NOTE — Patient Instructions (Signed)

## 2015-10-20 NOTE — Progress Notes (Signed)
Pre visit review using our clinic review tool, if applicable. No additional management support is needed unless otherwise documented below in the visit note. 

## 2015-11-25 ENCOUNTER — Encounter: Payer: Self-pay | Admitting: Internal Medicine

## 2015-11-25 DIAGNOSIS — E1142 Type 2 diabetes mellitus with diabetic polyneuropathy: Secondary | ICD-10-CM

## 2015-11-28 ENCOUNTER — Encounter: Payer: Self-pay | Admitting: Internal Medicine

## 2015-11-29 MED ORDER — INSULIN DETEMIR 100 UNIT/ML FLEXPEN: 38.0000 [IU] | PEN_INJECTOR | Freq: Every day | SUBCUTANEOUS | Status: DC

## 2015-11-30 ENCOUNTER — Encounter: Payer: Self-pay | Admitting: Internal Medicine

## 2015-11-30 MED ORDER — GLIPIZIDE 10 MG PO TABS: 10.0000 mg | ORAL_TABLET | Freq: Two times a day (BID) | ORAL | Status: DC

## 2015-12-21 ENCOUNTER — Other Ambulatory Visit: Payer: Self-pay | Admitting: *Deleted

## 2015-12-21 ENCOUNTER — Encounter: Payer: Self-pay | Admitting: Internal Medicine

## 2015-12-21 DIAGNOSIS — E1142 Type 2 diabetes mellitus with diabetic polyneuropathy: Secondary | ICD-10-CM

## 2015-12-21 MED ORDER — PEN NEEDLES 31G X 6 MM MISC: Status: DC

## 2015-12-21 MED ORDER — INSULIN PEN NEEDLE 31G X 5 MM MISC: Status: DC

## 2016-01-12 ENCOUNTER — Other Ambulatory Visit: Payer: Self-pay | Admitting: Internal Medicine

## 2016-02-11 ENCOUNTER — Other Ambulatory Visit: Payer: Self-pay | Admitting: Internal Medicine

## 2016-02-13 NOTE — Telephone Encounter (Signed)
Ok to refill? Janice Jennings's patient. Last prescribed on 01/12/2016. Last seen on 10/20/2015. No future appointment.

## 2016-02-14 NOTE — Telephone Encounter (Signed)
Sending letter requesting for patient to schedule follow up appt. Message left for patient to return my call.

## 2016-02-20 ENCOUNTER — Telehealth: Payer: Self-pay | Admitting: Internal Medicine

## 2016-02-20 ENCOUNTER — Ambulatory Visit (INDEPENDENT_AMBULATORY_CARE_PROVIDER_SITE_OTHER): Payer: BC Managed Care – PPO | Admitting: Family Medicine

## 2016-02-20 ENCOUNTER — Encounter: Payer: Self-pay | Admitting: Family Medicine

## 2016-02-20 VITALS — BP 142/90 | HR 101 | Temp 98.4°F | Ht 64.25 in | Wt 200.5 lb

## 2016-02-20 DIAGNOSIS — L03011 Cellulitis of right finger: Secondary | ICD-10-CM | POA: Diagnosis not present

## 2016-02-20 DIAGNOSIS — E1142 Type 2 diabetes mellitus with diabetic polyneuropathy: Secondary | ICD-10-CM

## 2016-02-20 MED ORDER — DOXYCYCLINE HYCLATE 100 MG PO TABS: 100.0000 mg | ORAL_TABLET | Freq: Two times a day (BID) | ORAL | Status: DC

## 2016-02-20 MED ORDER — CEPHALEXIN 500 MG PO CAPS: 1000.0000 mg | ORAL_CAPSULE | Freq: Two times a day (BID) | ORAL | Status: DC

## 2016-02-20 NOTE — Telephone Encounter (Signed)
Appointment today at 1600 with Dr. Lorelei Pont.

## 2016-02-20 NOTE — Progress Notes (Signed)
Pre visit review using our clinic review tool, if applicable. No additional management support is needed unless otherwise documented below in the visit note. 

## 2016-02-20 NOTE — Telephone Encounter (Signed)
[facility] Patient Name: Janice Jennings DOB: [birth year] Initial Comment Caller states thumb started swelling up yesterday, has a lot of pain. it is stiff and turning white around nail area. Right ear is sensitive. Nurse Assessment Nurse: Dimas Chyle, RN, Dellis Filbert Date/Time Eilene Ghazi Time): 02/20/2016 9:10:54 AM Confirm and document reason for call. If symptomatic, describe symptoms. You must click the next button to save text entered. ---Caller states thumb started swelling up yesterday, has a lot of pain. it is stiff and turning white around nail area. Right ear is sensitive. Thumb is reddened and warm to the touch. Has the patient traveled out of the country within the last 30 days? ---No Does the patient have any new or worsening symptoms? ---Yes Will a triage be completed? ---Yes Related visit to physician within the last 2 weeks? ---No Does the PT have any chronic conditions? (i.e. diabetes, asthma, etc.) ---Yes List chronic conditions. ---HTN, diabetes type 2 Is the patient pregnant or possibly pregnant? (Ask all females between the ages of 36-55) ---No Is this a behavioral health or substance abuse call? ---No Guidelines Guideline Title Affirmed Question Affirmed Notes Hand and Wrist Pain Numbness (i.e., loss of sensation) in hand or fingers (Exception: just tingling; numbness present > 2 weeks) Final Disposition User See Physician within 24 Hours Dimas Chyle, RN, [employer] Referrals REFERRED TO PCP OFFICE Disagree/Comply:

## 2016-02-20 NOTE — Progress Notes (Signed)
Dr. Frederico Hamman T. Airik Goodlin, MD, [facility] [place], [address] Phone: [phone] Fax: [phone]  02/20/2016  Patient: Janice Jennings, MRN: [id number], DOB: [birth year], 45 y.o.  Primary Physician:  Webb Silversmith, NP   Chief Complaint  Patient presents with  . Thumb Pain    Right    Subjective:   Janice Jennings is a 45 y.o. very pleasant female patient who presents with the following:  Lab Results  Component Value Date   HGBA1C 6.7* 10/20/2015    L paronychia and cellulitis. The patient awoke on Sunday morning with some swelling and pain in her finger. She has some whitish coloration on the distal tip just posterior to the nail, and she also has some redness and swelling throughout the entirety of the finger. There is some warmth throughout the finger, she has been having pain in that thumb and also proximal to this and into the adjacent fingers and the hand. They do not appear to be red this point.  Past Medical History, Surgical History, Social History, Family History, Problem List, Medications, and Allergies have been reviewed and updated if relevant.  Patient Active Problem List   Diagnosis Date Noted  . Essential hypertension 03/07/2015  . Obesity (BMI 30-39.9) 10/20/2014  . Trigger finger of right hand 05/26/2014  . DM type 2 with diabetic peripheral neuropathy ([facility]) 05/26/2014    Past Medical History  Diagnosis Date  . Diabetes mellitus without complication ([place])   . Migraine     Excedrin Tension HA OTC    Past Surgical History  Procedure Laterality Date  . Breast reduction surgery  2001    Social History   Social History  . Marital Status: Single    Spouse Name: N/A  . Number of Children: 2  . Years of Education: 18   Occupational History  . 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 Smokeless tobacco: Never Used  . Alcohol Use:  0.0 oz/week    0 Standard drinks or equivalent per week     Comment: Occasional - maybe 3 times a month  . Drug Use: No  . Sexual Activity: Yes   Other Topics Concern  . Not on file   Social History Narrative   [name] grew up in [place], [place] and also in [place]. She attending the [employer] and obtained her Bachelors in Psychology. She then attended [employer] and obtained her Masters in Psychology. 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 She lives at home with her two children, Janice Jennings age 53 and Janice Jennings age 24. They have a dog named Coco who she describes as having separation anxiety. Taji enjoys activities with the children. They enjoy outdoor activities, watching movies.      Family History  Problem Relation Age of Onset  . Diabetes Father   . Hyperlipidemia Father   . Hypertension Father   . Cancer Maternal Aunt 65    leukemia - died  . Diabetes Maternal Grandmother   . Cancer Maternal Grandmother     breast cancer  . Stroke Maternal Grandmother   . Diabetes Daughter     Pre-diabetes  . Diabetes Maternal Grandfather   . Hyperlipidemia Maternal Grandfather   . Hypertension Maternal Grandfather   . Cancer Maternal Aunt 50    breast cancer - died    No Known Allergies  Medication list reviewed and updated in full in [facility].  GEN: No fevers, chills. Nontoxic. Primarily MSK c/o today. MSK: Detailed in the HPI GI: tolerating PO intake without difficulty Neuro: No numbness, parasthesias, or tingling associated. Otherwise the pertinent positives of the ROS are noted above.   Objective:   BP 142/90 mmHg  Pulse 101  Temp(Src) 98.4 F (36.9 C) (Oral)  Ht 5' 4.25" (1.632 m)  Wt 200 lb 8 oz (90.946 kg)  BMI 34.15 kg/m2   GEN: WDWN, NAD, Non-toxic, Alert & Oriented x 3 HEENT: Atraumatic, Normocephalic.  Ears and Nose: No external deformity. EXTR: No clubbing/cyanosis/edema NEURO: Normal gait.  PSYCH:  Normally interactive. Conversant. Not depressed or anxious appearing.  Calm demeanor.    The right first digit is significantly swollen compared to the left. There is redness and warmth throughout most of the first digit. The distal finger is whitish in coloration and there appears to be some pus just proximal to the nail and the nail bed. She is able to move the finger, but is limited somewhat. She also has some pain proximal to the finger and also to a lesser extent in the second digit, which also moves appropriately and fully at this point.  Radiology: No results found.  Assessment and Plan:   Paronychia of right thumb - Plan: Wound culture  Cellulitis of right thumb  DM type 2 with diabetic peripheral neuropathy (HCC)  Classic paronychia, drain easily, with surrounding adjacent cellulitis of the thumb on the right. Will cover her for typical staph and strep flora, along with MRSA coverage while we are awaiting sensitivities.  I&D Indication: suspect abscess Pt complaints of: erythema, pain, swelling Location: R thum Size: 1 1/2 cm Verbal informed consent obtained.  Pt aware of risks not limited to but including infection, bleeding, damage to near by organs. Prep: etoh/betadine Anesthesia: 1%lidocaine without epi, good effect, digital block Incision made with #11 blade along the base of the nail. Amount expressed: 1 1/2 mL of pus combined with blood Tolerated well keep area clean and bandaged, follow up in 2 days. Call ASAP if redness and pain worsen.  Follow-up: Return in about 2 days (around 02/22/2016) for f/u with Dr. Lorelei Pont.  New Prescriptions   CEPHALEXIN (KEFLEX) 500 MG CAPSULE    Take 2 capsules (1,000 mg total) by mouth 2 (two) times daily.   DOXYCYCLINE (VIBRA-TABS) 100 MG TABLET    Take 1 tablet (100 mg total) by mouth 2 (two) times daily.   Orders Placed This Encounter  Procedures  . Wound culture    Signed,  Frederico Hamman T. Haneef Hallquist, MD   Patient's Medications    New Prescriptions   CEPHALEXIN (KEFLEX) 500 MG CAPSULE    Take 2 capsules (1,000 mg total) by mouth 2 (two) times daily.   DOXYCYCLINE (VIBRA-TABS) 100 MG TABLET    Take 1 tablet (100 mg total) by mouth 2 (two) times daily.  Previous Medications   BIOTIN 5000 MCG CAPS    Take 10,000 mcg by mouth daily.   CINNAMON 500 MG CAPSULE    Take 500 mg by mouth daily.   GABAPENTIN (NEURONTIN) 100 MG CAPSULE    TAKE 1 CAPSULE (100 MG TOTAL) BY MOUTH 3 (THREE) TIMES DAILY. MUST SCHEDULE FOLLOW UP OFFICE VISIT   GARCINIA CAMBOGIA-CHROMIUM PO    Take 1 capsule by mouth daily.   GLIPIZIDE (GLUCOTROL) 10 MG TABLET    Take 1 tablet (10 mg total) by mouth 2 (two) times daily  before a meal.   GLUCOSE BLOOD (ONE TOUCH TEST STRIPS) TEST STRIP    1 each by Other route 2 (two) times daily.   INSULIN DETEMIR (LEVEMIR) 100 UNIT/ML PEN    Inject 38 Units into the skin daily at 10 pm.   INSULIN PEN NEEDLE 31G X 5 MM MISC    Use one pen needle two times daily.   LANCETS (ONETOUCH ULTRASOFT) LANCETS    1 each by Other route 2 (two) times daily.   LISINOPRIL-HYDROCHLOROTHIAZIDE (PRINZIDE,ZESTORETIC) 20-25 MG TABLET    Take 1 tablet by mouth daily.   METFORMIN (GLUCOPHAGE) 1000 MG TABLET    Take 1 tablet (1,000 mg total) by mouth 2 (two) times daily with a meal.   MULTIPLE VITAMIN (MULTIVITAMIN) TABLET    Take 1 tablet by mouth daily.   OVER THE COUNTER MEDICATION    1 tablet daily. Keratin vitamin   SITAGLIPTIN (JANUVIA) 100 MG TABLET    Take 1 tablet (100 mg total) by mouth daily.  Modified Medications   No medications on file  Discontinued Medications   No medications on file

## 2016-02-22 ENCOUNTER — Ambulatory Visit: Payer: BC Managed Care – PPO | Admitting: Family Medicine

## 2016-02-23 ENCOUNTER — Encounter: Payer: Self-pay | Admitting: Internal Medicine

## 2016-02-23 ENCOUNTER — Ambulatory Visit (INDEPENDENT_AMBULATORY_CARE_PROVIDER_SITE_OTHER): Payer: BC Managed Care – PPO | Admitting: Internal Medicine

## 2016-02-23 VITALS — BP 120/84 | HR 94 | Temp 98.3°F | Wt 195.0 lb

## 2016-02-23 DIAGNOSIS — L03011 Cellulitis of right finger: Secondary | ICD-10-CM | POA: Diagnosis not present

## 2016-02-23 DIAGNOSIS — Z794 Long term (current) use of insulin: Secondary | ICD-10-CM

## 2016-02-23 DIAGNOSIS — E1165 Type 2 diabetes mellitus with hyperglycemia: Secondary | ICD-10-CM | POA: Diagnosis not present

## 2016-02-23 DIAGNOSIS — Z113 Encounter for screening for infections with a predominantly sexual mode of transmission: Secondary | ICD-10-CM

## 2016-02-23 LAB — WOUND CULTURE: Gram Stain: NONE SEEN

## 2016-02-23 NOTE — Progress Notes (Signed)
Pre visit review using our clinic review tool, if applicable. No additional management support is needed unless otherwise documented below in the visit note. 

## 2016-02-23 NOTE — Patient Instructions (Signed)

## 2016-02-23 NOTE — Progress Notes (Signed)
Subjective:    Patient ID: Janice Jennings, female    DOB: 11-24-1970, 45 y.o.   MRN: [id number]  HPI  Pt presents to the clinic today for 2 day follow up for paronychia and cellulitis of the right thumb. She saw Dr. Lorelei Pont on Tuesday for the same. Wound culture grew MRSA. She was started on Keflex and Doxycycline. She reports improvement in her pain and swelling. She has not noticed any drainage from the thumb. She denies fever, chills, body aches or flu like symptoms. She does have a history of DM 2.  She also wants to be screened for STD's. She just ended a relationship in which she was sexually active.  Review of Systems      Past Medical History  Diagnosis Date  . Diabetes mellitus without complication ([place])   . Migraine     Excedrin Tension HA OTC    Current Outpatient Prescriptions  Medication Sig Dispense Refill  . Biotin 5000 MCG CAPS Take 10,000 mcg by mouth daily.    . cephALEXin (KEFLEX) 500 MG capsule Take 2 capsules (1,000 mg total) by mouth 2 (two) times daily. 40 capsule 0  . Cinnamon 500 MG capsule Take 500 mg by mouth daily.    Marland Kitchen doxycycline (VIBRA-TABS) 100 MG tablet Take 1 tablet (100 mg total) by mouth 2 (two) times daily. 20 tablet 0  . gabapentin (NEURONTIN) 100 MG capsule TAKE 1 CAPSULE (100 MG TOTAL) BY MOUTH 3 (THREE) TIMES DAILY. MUST SCHEDULE FOLLOW UP OFFICE VISIT 90 capsule 0  . GARCINIA CAMBOGIA-CHROMIUM PO Take 1 capsule by mouth daily.    Marland Kitchen glipiZIDE (GLUCOTROL) 10 MG tablet Take 1 tablet (10 mg total) by mouth 2 (two) times daily before a meal. 180 tablet 1  . glucose blood (ONE TOUCH TEST STRIPS) test strip 1 each by Other route 2 (two) times daily. 200 each 4  . Insulin Detemir (LEVEMIR) 100 UNIT/ML Pen Inject 38 Units into the skin daily at 10 pm. 15 mL 5  . Insulin Pen Needle 31G X 5 MM MISC Use one pen needle two times daily. 200 each 3  . Lancets (ONETOUCH ULTRASOFT) lancets 1 each by Other route 2 (two) times daily. 200 each 3  .  lisinopril-hydrochlorothiazide (PRINZIDE,ZESTORETIC) 20-25 MG tablet Take 1 tablet by mouth daily. 90 tablet 2  . metFORMIN (GLUCOPHAGE) 1000 MG tablet Take 1 tablet (1,000 mg total) by mouth 2 (two) times daily with a meal. 180 tablet 1  . Multiple Vitamin (MULTIVITAMIN) tablet Take 1 tablet by mouth daily.    Marland Kitchen OVER THE COUNTER MEDICATION 1 tablet daily. Keratin vitamin    . sitaGLIPtin (JANUVIA) 100 MG tablet Take 1 tablet (100 mg total) by mouth daily. 90 tablet 1   No current facility-administered medications for this visit.    No Known Allergies  Family History  Problem Relation Age of Onset  . Diabetes Father   . Hyperlipidemia Father   . Hypertension Father   . Cancer Maternal Aunt 34    leukemia - died  . Diabetes Maternal Grandmother   . Cancer Maternal Grandmother     breast cancer  . Stroke Maternal Grandmother   . Diabetes Daughter     Pre-diabetes  . Diabetes Maternal Grandfather   . Hyperlipidemia Maternal Grandfather   . Hypertension Maternal Grandfather   . Cancer Maternal Aunt 50    breast cancer - died    Social History   Social History  . Marital Status: Single  Spouse Name: N/A  . Number of Children: 2  . Years of Education: 18   Occupational History  . 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 Smokeless tobacco: Never Used  . Alcohol Use: 0.0 oz/week    0 Standard drinks or equivalent per week     Comment: Occasional - maybe 3 times a month  . Drug Use: No  . Sexual Activity: Yes   Other Topics Concern  . Not on file   Social History Narrative   Anggie grew up in [place], [place] and also in [place]. She attending the [employer] and obtained her Bachelors in Psychology. She then attended [employer] and obtained her Masters in Psychology. 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 She lives at home with her two children, Darius age 49  and Superior age 48. They have a dog named Coco who she describes as having separation anxiety. Kadin enjoys activities with the children. They enjoy outdoor activities, watching movies.       Constitutional: Denies fever, malaise, fatigue, headache or abrupt weight changes.  Respiratory: Denies difficulty breathing, shortness of breath, cough or sputum production.   Cardiovascular: Denies chest pain, chest tightness, palpitations or swelling in the hands or feet.  Musculoskeletal: Pt reports right thumb swelling. Denies decrease in range of motion, difficulty with gait, muscle pain or joint pain.  Skin: Pt reports paronychia of right thumb. Denies redness, rashes, lesions or ulcercations.    No other specific complaints in a complete review of systems (except as listed in HPI above).  Objective:   Physical Exam   BP 120/84 mmHg  Pulse 94  Temp(Src) 98.3 F (36.8 C) (Oral)  Wt 195 lb (88.451 kg)  SpO2 99% Wt Readings from Last 3 Encounters:  02/23/16 195 lb (88.451 kg)  02/20/16 200 lb 8 oz (90.946 kg)  10/20/15 196 lb (88.905 kg)    General: Appears her stated age, obese in NAD. Skin: Warm, dry and intact. Maceration noted around right great thumb nail. No drainage noted. Cardiovascular: Normal rate and rhythm. S1,S2 noted.  No murmur, rubs or gallops noted.  Pulmonary/Chest: Normal effort and positive vesicular breath sounds. No respiratory distress. No wheezes, rales or ronchi noted.  Abdomen: Soft and nontender.  Musculoskeletal: Normal flexion, extension and rotation of the right thumb. Neurological: Alert and oriented. Sensation intact to bilateral hands.   BMET    Component Value Date/Time   NA 139 10/20/2015 0943   NA 134* 02/14/2013 1416   K 4.0 10/20/2015 0943   K 4.1 02/14/2013 1416   CL 103 10/20/2015 0943   CL 101 02/14/2013 1416   CO2 30 10/20/2015 0943   CO2 25 02/14/2013 1416   GLUCOSE 169* 10/20/2015 0943   GLUCOSE 349* 02/14/2013 1416   BUN 14  10/20/2015 0943   BUN 15 02/14/2013 1416   CREATININE 0.94 10/20/2015 0943   CREATININE 0.88 07/01/2015 1559   CREATININE 1.10 02/14/2013 1416   CALCIUM 9.8 10/20/2015 0943   CALCIUM 9.4 02/14/2013 1416   GFRNONAA >60 02/14/2013 1416   GFRAA >60 02/14/2013 1416    Lipid Panel     Component Value Date/Time   CHOL 195 07/01/2015 1559   TRIG 213* 07/01/2015 1559   HDL 44* 07/01/2015 1559   CHOLHDL 4.4 07/01/2015 1559   VLDL 43* 07/01/2015 1559   LDLCALC 108 07/01/2015 1559    CBC  Component Value Date/Time   WBC 7.3 07/01/2015 1559   WBC 7.9 02/14/2013 1416   RBC 4.32 07/01/2015 1559   RBC 4.74 02/14/2013 1416   HGB 13.5 07/01/2015 1559   HGB 14.6 02/14/2013 1416   HCT 39.1 07/01/2015 1559   HCT 44.6 02/14/2013 1416   PLT 326 07/01/2015 1559   PLT 338 02/14/2013 1416   MCV 90.5 07/01/2015 1559   MCV 94 02/14/2013 1416   MCH 31.3 07/01/2015 1559   MCH 30.9 02/14/2013 1416   MCHC 34.5 07/01/2015 1559   MCHC 32.8 02/14/2013 1416   RDW 13.5 07/01/2015 1559   RDW 13.1 02/14/2013 1416    Hgb A1C Lab Results  Component Value Date   HGBA1C 6.7* 10/20/2015        Assessment & Plan:   Paronychia/Cellulitis of right thumb:  MRSA was resistant to Keflex- stop Continue Doxycycline BID- finish course Ibuprofen as needed for pain/inflammation Notify us if increased redness, warmth, pain or flu like symptoms  Screen for STD:  Urine gen probe for gonorrhea/chalmydia Wet prep sent off for trichomonas/yeast/BV Will check HIV, Hep C, HSV 1 & 2, RPR  RTC as needed or if symptoms persist or worsen

## 2016-02-24 ENCOUNTER — Other Ambulatory Visit: Payer: Self-pay | Admitting: Internal Medicine

## 2016-02-24 LAB — WET PREP BY MOLECULAR PROBE
CANDIDA SPECIES: NEGATIVE
GARDNERELLA VAGINALIS: POSITIVE — AB
Trichomonas vaginosis: NEGATIVE

## 2016-02-24 LAB — GC/CHLAMYDIA PROBE AMP
CT PROBE, AMP APTIMA: NOT DETECTED
GC PROBE AMP APTIMA: NOT DETECTED

## 2016-02-24 LAB — RPR

## 2016-02-24 LAB — HIV ANTIBODY (ROUTINE TESTING W REFLEX): HIV 1&2 Ab, 4th Generation: NONREACTIVE

## 2016-02-24 LAB — HEPATITIS C ANTIBODY: HCV Ab: NEGATIVE

## 2016-02-24 MED ORDER — METRONIDAZOLE 0.75 % VA GEL: 1.0000 | Freq: Two times a day (BID) | VAGINAL | Status: DC

## 2016-02-26 ENCOUNTER — Other Ambulatory Visit: Payer: Self-pay | Admitting: Internal Medicine

## 2016-02-28 LAB — HSV(HERPES SMPLX)ABS-I+II(IGG+IGM)-BLD
HERPES SIMPLEX VRS I-IGM AB (EIA): 1.16 {index} — AB
HSV 1 Glycoprotein G Ab, IgG: 2 Index — ABNORMAL HIGH (ref <0.90)
HSV 2 Glycoprotein G Ab, IgG: 15.9 Index — ABNORMAL HIGH (ref <0.90)

## 2016-03-02 ENCOUNTER — Other Ambulatory Visit: Payer: Self-pay | Admitting: Family Medicine

## 2016-03-02 NOTE — Telephone Encounter (Signed)
This should not be refilled. If her finger has not improved, she needs to have it rechecked first.

## 2016-03-02 NOTE — Telephone Encounter (Signed)
Last office visit 02/23/2016.  Refill?

## 2016-03-11 ENCOUNTER — Encounter: Payer: Self-pay | Admitting: Internal Medicine

## 2016-03-12 ENCOUNTER — Encounter: Payer: Self-pay | Admitting: Internal Medicine

## 2016-03-13 ENCOUNTER — Other Ambulatory Visit: Payer: Self-pay | Admitting: Primary Care

## 2016-03-13 NOTE — Telephone Encounter (Signed)
Electronically refill request for   gabapentin (NEURONTIN) 100 MG capsule   TAKE 1 CAPSULE (100 MG TOTAL) BY MOUTH 3 (THREE) TIMES DAILY.   Last prescribed on 02/13/2016. Looks like last seen on the issue on 10/20/2015.

## 2016-03-15 ENCOUNTER — Encounter: Payer: Self-pay | Admitting: Internal Medicine

## 2016-03-19 ENCOUNTER — Other Ambulatory Visit: Payer: Self-pay | Admitting: Internal Medicine

## 2016-04-10 ENCOUNTER — Other Ambulatory Visit: Payer: Self-pay | Admitting: Internal Medicine

## 2016-04-11 NOTE — Telephone Encounter (Signed)
Last filled 03/13/16--please advise if okay for pt to continue

## 2016-05-27 ENCOUNTER — Other Ambulatory Visit: Payer: Self-pay | Admitting: Internal Medicine

## 2016-06-14 ENCOUNTER — Other Ambulatory Visit: Payer: Self-pay | Admitting: Internal Medicine

## 2016-06-14 DIAGNOSIS — I10 Essential (primary) hypertension: Secondary | ICD-10-CM

## 2016-06-25 ENCOUNTER — Ambulatory Visit (INDEPENDENT_AMBULATORY_CARE_PROVIDER_SITE_OTHER): Payer: Medicaid Other | Admitting: Internal Medicine

## 2016-06-25 ENCOUNTER — Encounter: Payer: Self-pay | Admitting: Internal Medicine

## 2016-06-25 VITALS — BP 120/86 | HR 89 | Temp 98.6°F | Ht 64.25 in | Wt 189.0 lb

## 2016-06-25 DIAGNOSIS — I10 Essential (primary) hypertension: Secondary | ICD-10-CM

## 2016-06-25 DIAGNOSIS — Z0000 Encounter for general adult medical examination without abnormal findings: Secondary | ICD-10-CM | POA: Diagnosis not present

## 2016-06-25 DIAGNOSIS — N921 Excessive and frequent menstruation with irregular cycle: Secondary | ICD-10-CM

## 2016-06-25 DIAGNOSIS — E1142 Type 2 diabetes mellitus with diabetic polyneuropathy: Secondary | ICD-10-CM

## 2016-06-25 DIAGNOSIS — Z113 Encounter for screening for infections with a predominantly sexual mode of transmission: Secondary | ICD-10-CM

## 2016-06-25 LAB — COMPREHENSIVE METABOLIC PANEL
ALK PHOS: 53 U/L (ref 39–117)
ALT: 20 U/L (ref 0–35)
AST: 15 U/L (ref 0–37)
Albumin: 4.2 g/dL (ref 3.5–5.2)
BILIRUBIN TOTAL: 0.7 mg/dL (ref 0.2–1.2)
BUN: 22 mg/dL (ref 6–23)
CALCIUM: 9.9 mg/dL (ref 8.4–10.5)
CO2: 29 meq/L (ref 19–32)
Chloride: 99 mEq/L (ref 96–112)
Creatinine, Ser: 1.06 mg/dL (ref 0.40–1.20)
GFR: 71.37 mL/min (ref >60.00)
Glucose, Bld: 274 mg/dL — ABNORMAL HIGH (ref 70–99)
Potassium: 4.1 mEq/L (ref 3.5–5.1)
Sodium: 135 mEq/L (ref 135–145)
Total Protein: 7.9 g/dL (ref 6.0–8.3)

## 2016-06-25 LAB — CBC
HCT: 39.5 % (ref 36.0–46.0)
HEMOGLOBIN: 13.2 g/dL (ref 12.0–15.0)
MCHC: 33.4 g/dL (ref 30.0–36.0)
MCV: 94.3 fl (ref 78.0–100.0)
PLATELETS: 300 10*3/uL (ref 150.0–400.0)
RBC: 4.19 Mil/uL (ref 3.87–5.11)
RDW: 13.9 % (ref 11.5–15.5)
WBC: 6.1 10*3/uL (ref 4.0–10.5)

## 2016-06-25 LAB — LIPID PANEL
CHOL/HDL RATIO: 4
Cholesterol: 215 mg/dL — ABNORMAL HIGH (ref 0–200)
HDL: 50 mg/dL (ref >39.00)
LDL Cholesterol: 136 mg/dL — ABNORMAL HIGH (ref 0–99)
NonHDL: 165.02
TRIGLYCERIDES: 145 mg/dL (ref 0.0–149.0)
VLDL: 29 mg/dL (ref 0.0–40.0)

## 2016-06-25 LAB — HEMOGLOBIN A1C: Hgb A1c MFr Bld: 8.5 % — ABNORMAL HIGH (ref 4.6–6.5)

## 2016-06-25 MED ORDER — GABAPENTIN 100 MG PO CAPS: 200.0000 mg | ORAL_CAPSULE | Freq: Three times a day (TID) | ORAL | 2 refills | Status: DC

## 2016-06-25 NOTE — Patient Instructions (Signed)
Health Maintenance, Female Adopting a healthy lifestyle and getting preventive care can go a long way to promote health and wellness. Talk with your health care provider about what schedule of regular examinations is right for you. This is a good chance for you to check in with your provider about disease prevention and staying healthy. In between checkups, there are plenty of things you can do on your own. Experts have done a lot of research about which lifestyle changes and preventive measures are most likely to keep you healthy. Ask your health care provider for more information. WEIGHT AND DIET  Eat a healthy diet  Be sure to include plenty of vegetables, fruits, low-fat dairy products, and lean protein.  Do not eat a lot of foods high in solid fats, added sugars, or salt.  Get regular exercise. This is one of the most important things you can do for your health.  Most adults should exercise for at least 150 minutes each week. The exercise should increase your heart rate and make you sweat (moderate-intensity exercise).  Most adults should also do strengthening exercises at least twice a week. This is in addition to the moderate-intensity exercise.  Maintain a healthy weight  Body mass index (BMI) is a measurement that can be used to identify possible weight problems. It estimates body fat based on height and weight. Your health care provider can help determine your BMI and help you achieve or maintain a healthy weight.  For females 20 years of age and older:   A BMI below 18.5 is considered underweight.  A BMI of 18.5 to 24.9 is normal.  A BMI of 25 to 29.9 is considered overweight.  A BMI of 30 and above is considered obese.  Watch levels of cholesterol and blood lipids  You should start having your blood tested for lipids and cholesterol at 47 years of age, then have this test every 5 years.  You may need to have your cholesterol levels checked more often if:  Your lipid  or cholesterol levels are high.  You are older than 47 years of age.  You are at high risk for heart disease.  CANCER SCREENING   Lung Cancer  Lung cancer screening is recommended for adults 55-80 years old who are at high risk for lung cancer because of a history of smoking.  A yearly low-dose CT scan of the lungs is recommended for people who:  Currently smoke.  Have quit within the past 15 years.  Have at least a 30-pack-year history of smoking. A pack year is smoking an average of one pack of cigarettes a day for 1 year.  Yearly screening should continue until it has been 15 years since you quit.  Yearly screening should stop if you develop a health problem that would prevent you from having lung cancer treatment.  Breast Cancer  Practice breast self-awareness. This means understanding how your breasts normally appear and feel.  It also means doing regular breast self-exams. Let your health care provider know about any changes, no matter how small.  If you are in your 20s or 30s, you should have a clinical breast exam (CBE) by a health care provider every 1-3 years as part of a regular health exam.  If you are 40 or older, have a CBE every year. Also consider having a breast X-ray (mammogram) every year.  If you have a family history of breast cancer, talk to your health care provider about genetic screening.  If you   are at high risk for breast cancer, talk to your health care provider about having an MRI and a mammogram every year.  Breast cancer gene (BRCA) assessment is recommended for women who have family members with BRCA-related cancers. BRCA-related cancers include:  Breast.  Ovarian.  Tubal.  Peritoneal cancers.  Results of the assessment will determine the need for genetic counseling and BRCA1 and BRCA2 testing. Cervical Cancer Your health care provider may recommend that you be screened regularly for cancer of the pelvic organs (ovaries, uterus, and  vagina). This screening involves a pelvic examination, including checking for microscopic changes to the surface of your cervix (Pap test). You may be encouraged to have this screening done every 3 years, beginning at age 21.  For women ages 30-65, health care providers may recommend pelvic exams and Pap testing every 3 years, or they may recommend the Pap and pelvic exam, combined with testing for human papilloma virus (HPV), every 5 years. Some types of HPV increase your risk of cervical cancer. Testing for HPV may also be done on women of any age with unclear Pap test results.  Other health care providers may not recommend any screening for nonpregnant women who are considered low risk for pelvic cancer and who do not have symptoms. Ask your health care provider if a screening pelvic exam is right for you.  If you have had past treatment for cervical cancer or a condition that could lead to cancer, you need Pap tests and screening for cancer for at least 20 years after your treatment. If Pap tests have been discontinued, your risk factors (such as having a new sexual partner) need to be reassessed to determine if screening should resume. Some women have medical problems that increase the chance of getting cervical cancer. In these cases, your health care provider may recommend more frequent screening and Pap tests. Colorectal Cancer  This type of cancer can be detected and often prevented.  Routine colorectal cancer screening usually begins at 47 years of age and continues through 47 years of age.  Your health care provider may recommend screening at an earlier age if you have risk factors for colon cancer.  Your health care provider may also recommend using home test kits to check for hidden blood in the stool.  A small camera at the end of a tube can be used to examine your colon directly (sigmoidoscopy or colonoscopy). This is done to check for the earliest forms of colorectal  cancer.  Routine screening usually begins at age 50.  Direct examination of the colon should be repeated every 5-10 years through 47 years of age. However, you may need to be screened more often if early forms of precancerous polyps or small growths are found. Skin Cancer  Check your skin from head to toe regularly.  Tell your health care provider about any new moles or changes in moles, especially if there is a change in a mole's shape or color.  Also tell your health care provider if you have a mole that is larger than the size of a pencil eraser.  Always use sunscreen. Apply sunscreen liberally and repeatedly throughout the day.  Protect yourself by wearing long sleeves, pants, a wide-brimmed hat, and sunglasses whenever you are outside. HEART DISEASE, DIABETES, AND HIGH BLOOD PRESSURE   High blood pressure causes heart disease and increases the risk of stroke. High blood pressure is more likely to develop in:  People who have blood pressure in the high end   of the normal range (130-139/85-89 mm Hg).  People who are overweight or obese.  People who are African American.  If you are 38-23 years of age, have your blood pressure checked every 3-5 years. If you are 61 years of age or older, have your blood pressure checked every year. You should have your blood pressure measured twice--once when you are at a hospital or clinic, and once when you are not at a hospital or clinic. Record the average of the two measurements. To check your blood pressure when you are not at a hospital or clinic, you can use:  An automated blood pressure machine at a pharmacy.  A home blood pressure monitor.  If you are between 45 years and 39 years old, ask your health care provider if you should take aspirin to prevent strokes.  Have regular diabetes screenings. This involves taking a blood sample to check your fasting blood sugar level.  If you are at a normal weight and have a low risk for diabetes,  have this test once every three years after 47 years of age.  If you are overweight and have a high risk for diabetes, consider being tested at a younger age or more often. PREVENTING INFECTION  Hepatitis B  If you have a higher risk for hepatitis B, you should be screened for this virus. You are considered at high risk for hepatitis B if:  You were born in a country where hepatitis B is common. Ask your health care provider which countries are considered high risk.  Your parents were born in a high-risk country, and you have not been immunized against hepatitis B (hepatitis B vaccine).  You have HIV or AIDS.  You use needles to inject street drugs.  You live with someone who has hepatitis B.  You have had sex with someone who has hepatitis B.  You get hemodialysis treatment.  You take certain medicines for conditions, including cancer, organ transplantation, and autoimmune conditions. Hepatitis C  Blood testing is recommended for:  Everyone born from [birth year] through 1967.  Anyone with known risk factors for hepatitis C. Sexually transmitted infections (STIs)  You should be screened for sexually transmitted infections (STIs) including gonorrhea and chlamydia if:  You are sexually active and are younger than 47 years of age.  You are older than 47 years of age and your health care provider tells you that you are at risk for this type of infection.  Your sexual activity has changed since you were last screened and you are at an increased risk for chlamydia or gonorrhea. Ask your health care provider if you are at risk.  If you do not have HIV, but are at risk, it may be recommended that you take a prescription medicine daily to prevent HIV infection. This is called pre-exposure prophylaxis (PrEP). You are considered at risk if:  You are sexually active and do not regularly use condoms or know the HIV status of your partner(s).  You take drugs by injection.  You are sexually  active with a partner who has HIV. Talk with your health care provider about whether you are at high risk of being infected with HIV. If you choose to begin PrEP, you should first be tested for HIV. You should then be tested every 3 months for as long as you are taking PrEP.  PREGNANCY   If you are premenopausal and you may become pregnant, ask your health care provider about preconception counseling.  If you may  become pregnant, take 400 to 800 micrograms (mcg) of folic acid every day.  If you want to prevent pregnancy, talk to your health care provider about birth control (contraception). OSTEOPOROSIS AND MENOPAUSE   Osteoporosis is a disease in which the bones lose minerals and strength with aging. This can result in serious bone fractures. Your risk for osteoporosis can be identified using a bone density scan.  If you are 61 years of age or older, or if you are at risk for osteoporosis and fractures, ask your health care provider if you should be screened.  Ask your health care provider whether you should take a calcium or vitamin D supplement to lower your risk for osteoporosis.  Menopause may have certain physical symptoms and risks.  Hormone replacement therapy may reduce some of these symptoms and risks. Talk to your health care provider about whether hormone replacement therapy is right for you.  HOME CARE INSTRUCTIONS   Schedule regular health, dental, and eye exams.  Stay current with your immunizations.   Do not use any tobacco products including cigarettes, chewing tobacco, or electronic cigarettes.  If you are pregnant, do not drink alcohol.  If you are breastfeeding, limit how much and how often you drink alcohol.  Limit alcohol intake to no more than 1 drink per day for nonpregnant women. One drink equals 12 ounces of beer, 5 ounces of wine, or 1 ounces of hard liquor.  Do not use street drugs.  Do not share needles.  Ask your health care provider for help if  you need support or information about quitting drugs.  Tell your health care provider if you often feel depressed.  Tell your health care provider if you have ever been abused or do not feel safe at home.   This information is not intended to replace advice given to you by your health care provider. Make sure you discuss any questions you have with your health care provider.   Document Released: 04/30/2011 Document Revised: 11/05/2014 Document Reviewed: 09/16/2013 Elsevier Interactive Patient Education [employer].

## 2016-06-25 NOTE — Assessment & Plan Note (Signed)
Controlled on Lisinopril HCT CMET today

## 2016-06-25 NOTE — Progress Notes (Signed)
Subjective:    Patient ID: Janice Jennings, female    DOB: [birth year], 45 y.o.   MRN: [id number]  HPI  Pt presents to the clinic today for her annual exam. She is also due for follow up of chronic conditions  HTN: She is taking Lisinopril-HCT. She feels like it is working well. Her BP today is 120/86. ECG from 06/2015 reviewed  DM2: Her last A1C in 122016 was 6.7.%. She is taking Metformin,Glipizide, Januvia, and Lantus (38 units) as instructed. She has not been checking her sugars like she should because she reports her strips do not fit her meter. She does try to consume a low carb diet. She has lost 20 pounds since 03/2015. Her last eye exam was 03/2015, she has one scheduled next month. Flu and Pneumovax are UTD. She does c/o a burning sensation in her feet. She reports she still has good sensation in her feet. Her last foot exam was 09/2015. She is taking Neurontin but reports she does not feel like it is working as well as it used to.   Flu: 06/2015 Tetanus: 04/2014 Pneumovax: 04/2014 Prevnar: 06/2015 Pap Smear: 06/2015 Mammogram: 08/2014 Vision Screening: 03/2015, has appt 06/2016 at [facility] Dentist: biannually  Diet: She consumes a low carb diet. She does not eat a lot of meat. She eats furits and veggies daily. She tries to avoid fried foods. She drinks mostly water. Exercise: She walks for about 1.5 hours, 5 days a week   Review of Systems      Past Medical History:  Diagnosis Date  . Diabetes mellitus without complication ([place])   . Migraine    Excedrin Tension HA OTC    Current Outpatient Prescriptions  Medication Sig Dispense Refill  . Biotin 5000 MCG CAPS Take 10,000 mcg by mouth daily.    . Cinnamon 500 MG capsule Take 500 mg by mouth daily.    Marland Kitchen doxycycline (VIBRA-TABS) 100 MG tablet Take 1 tablet (100 mg total) by mouth 2 (two) times daily. 20 tablet 0  . gabapentin (NEURONTIN) 100 MG capsule Take 1 capsule (100 mg total) by mouth 3 (three) times daily. 90  capsule 2  . GARCINIA CAMBOGIA-CHROMIUM PO Take 1 capsule by mouth daily.    Marland Kitchen glipiZIDE (GLUCOTROL) 10 MG tablet Take 1 tablet (10 mg total) by mouth 2 (two) times daily before a meal. PLEASE SCHEDULE ANNUAL PHYSICAL 180 tablet 0  . glucose blood (ONE TOUCH TEST STRIPS) test strip 1 each by Other route 2 (two) times daily. 200 each 4  . Insulin Detemir (LEVEMIR) 100 UNIT/ML Pen Inject 38 Units into the skin daily at 10 pm. 15 mL 5  . Insulin Pen Needle 31G X 5 MM MISC Use one pen needle two times daily. 200 each 3  . JANUVIA 100 MG tablet TAKE 1 TABLET BY MOUTH DAILY 90 tablet 1  . Lancets (ONETOUCH ULTRASOFT) lancets 1 each by Other route 2 (two) times daily. 200 each 3  . lisinopril-hydrochlorothiazide (PRINZIDE,ZESTORETIC) 20-25 MG tablet TAKE 1 TABLET BY MOUTH DAILY. 90 tablet 0  . metFORMIN (GLUCOPHAGE) 1000 MG tablet TAKE 1 TABLET (1,000 MG TOTAL) BY MOUTH 2 (TWO) TIMES DAILY WITH A MEAL. 180 tablet 1  . metroNIDAZOLE (METROGEL) 0.75 % vaginal gel Place 1 Applicatorful vaginally 2 (two) times daily. 70 g 0  . Multiple Vitamin (MULTIVITAMIN) tablet Take 1 tablet by mouth daily.    Marland Kitchen OVER THE COUNTER MEDICATION 1 tablet daily. Keratin vitamin     No current  facility-administered medications for this visit.     No Known Allergies  Family History  Problem Relation Age of Onset  . Diabetes Father   . Hyperlipidemia Father   . Hypertension Father   . Cancer Maternal Aunt 1    leukemia - died  . Diabetes Maternal Grandmother   . Cancer Maternal Grandmother     breast cancer  . Stroke Maternal Grandmother   . Diabetes Daughter     Pre-diabetes  . Diabetes Maternal Grandfather   . Hyperlipidemia Maternal Grandfather   . Hypertension Maternal Grandfather   . Cancer Maternal Aunt 50    breast cancer - died    Social History   Social History  . Marital status: Single    Spouse name: N/A  . Number of children: 2  . Years of education: 47   Occupational History  . 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 Smoking status: Never Smoker  . Smokeless tobacco: Never Used  . Alcohol use 0.0 oz/week     Comment: Occasional - maybe 3 times a month  . Drug use: No  . Sexual activity: Yes   Other Topics Concern  . Not on file   Social History Narrative   Janice Jennings grew up in [place], [place] and also in [place]. She attending the [employer] and obtained her Bachelors in Psychology. She then attended [employer] and obtained her Masters in Psychology. 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 She lives at home with her two children, Janice Jennings age 66 and [place] age 38. They have a dog named Coco who she describes as having separation anxiety. Shola enjoys activities with the children. They enjoy outdoor activities, watching movies.       Constitutional: Denies fever, malaise, fatigue, headache or abrupt weight changes.  HEENT: Denies eye pain, eye redness, ear pain, ringing in the ears, wax buildup, runny nose, nasal congestion, bloody nose, or sore throat. Respiratory: Denies difficulty breathing, shortness of breath, cough or sputum production.   Cardiovascular: Denies chest pain, chest tightness, palpitations or swelling in the hands or feet.  Gastrointestinal: Denies abdominal pain, bloating, constipation, diarrhea or blood in the stool.  GU: Pt reports heavy, irregular periods. Denies urgency, frequency, pain with urination, burning sensation, blood in urine, odor or discharge. Musculoskeletal: Denies decrease in range of motion, difficulty with gait, muscle pain or joint pain and swelling.  Skin: Denies redness, rashes, lesions or ulcercations.  Neurological: Pt reports burning sensation in feet. Denies dizziness, difficulty with memory, difficulty with speech or problems with balance and coordination.  Psych: Denies anxiety, depression, SI/HI.  No other specific complaints in a  complete review of systems (except as listed in HPI above).  Objective:   Physical Exam   BP 120/86   Pulse 89   Temp 98.6 F (37 C) (Oral)   Ht 5' 4.25" (1.632 m)   Wt 189 lb (85.7 kg)   LMP 06/11/2016 (Exact Date)   SpO2 99%   BMI 32.19 kg/m   Wt Readings from Last 3 Encounters:  02/23/16 195 lb (88.5 kg)  02/20/16 200 lb 8 oz (90.9 kg)  10/20/15 196 lb (88.9 kg)    General: Appears her stated age, obese in NAD. Skin: Warm, dry and intact.  HEENT: Head: normal shape and size; Eyes: sclera white, no icterus, conjunctiva pink, PERRLA and EOMs intact; Ears: Tm's gray and intact, normal light reflex;Throat/Mouth: Teeth  present, mucosa pink and moist, no exudate, lesions or ulcerations noted.  Neck:  Neck supple, trachea midline. No masses, lumps or thyromegaly present.  Cardiovascular: Normal rate and rhythm. S1,S2 noted.  No murmur, rubs or gallops noted. No JVD or BLE edema. No carotid bruits noted. Pulmonary/Chest: Normal effort and positive vesicular breath sounds. No respiratory distress. No wheezes, rales or ronchi noted.  Abdomen: Soft and nontender. Normal bowel sounds. No distention or masses noted. Liver, spleen and kidneys non palpable. Musculoskeletal: Normal range of motion. Strength 5/5 BUE/BLE. No signs of joint swelling. No difficulty with gait.  Neurological: Alert and oriented. Cranial nerves II-XII grossly intact. Coordination normal.  Psychiatric: Mood and affect normal. Behavior is normal. Judgment and thought content normal.     BMET    Component Value Date/Time   NA 139 10/20/2015 0943   NA 134 (L) 02/14/2013 1416   K 4.0 10/20/2015 0943   K 4.1 02/14/2013 1416   CL 103 10/20/2015 0943   CL 101 02/14/2013 1416   CO2 30 10/20/2015 0943   CO2 25 02/14/2013 1416   GLUCOSE 169 (H) 10/20/2015 0943   GLUCOSE 349 (H) 02/14/2013 1416   BUN 14 10/20/2015 0943   BUN 15 02/14/2013 1416   CREATININE 0.94 10/20/2015 0943   CREATININE 0.88 07/01/2015 1559    CALCIUM 9.8 10/20/2015 0943   CALCIUM 9.4 02/14/2013 1416   GFRNONAA >60 02/14/2013 1416   GFRAA >60 02/14/2013 1416    Lipid Panel     Component Value Date/Time   CHOL 195 07/01/2015 1559   TRIG 213 (H) 07/01/2015 1559   HDL 44 (L) 07/01/2015 1559   CHOLHDL 4.4 07/01/2015 1559   VLDL 43 (H) 07/01/2015 1559   LDLCALC 108 07/01/2015 1559    CBC    Component Value Date/Time   WBC 7.3 07/01/2015 1559   RBC 4.32 07/01/2015 1559   HGB 13.5 07/01/2015 1559   HGB 14.6 02/14/2013 1416   HCT 39.1 07/01/2015 1559   HCT 44.6 02/14/2013 1416   PLT 326 07/01/2015 1559   PLT 338 02/14/2013 1416   MCV 90.5 07/01/2015 1559   MCV 94 02/14/2013 1416   MCH 31.3 07/01/2015 1559   MCHC 34.5 07/01/2015 1559   RDW 13.5 07/01/2015 1559   RDW 13.1 02/14/2013 1416    Hgb A1C Lab Results  Component Value Date   HGBA1C 6.7 (H) 10/20/2015        Assessment & Plan:   Preventative Health Maintenance:  Advised her to get a flu shot in the fall Tetanus, Prevnar and Pneumovax UTD Mammogram ordered, she will call [facility] to schedule Pap Smear UTD but she does want STD screening, wet prep, urine G/C and HIV/RPR ordered Encouraged her to consume a balanced diet and exercise program Encouraged her to see an eye doctor and dentist at least annually Will check CVC, CMET, Lipid and A1C today  Menorrhagia with irregular cycle:  She declines starting on OCP's a this time Will monitor  RTC in 6 months to follow up chronic conditions  Krrish Freund, NP

## 2016-06-25 NOTE — Assessment & Plan Note (Signed)
Will check A1C and Lipid Profile today No microalbumin secondary to ACEI therapy Encouraged her to consume a low fat, low carb diet Flu, pneumovax and prevnar UTD Encouraged yearly eye exams Foot exam today Continue current medication unless directed otherwise Increase Gabapentin to 200 mg TID- let me know if neuropathy does not improve

## 2016-06-26 LAB — HIV ANTIBODY (ROUTINE TESTING W REFLEX): HIV 1&2 Ab, 4th Generation: NONREACTIVE

## 2016-06-26 LAB — WET PREP BY MOLECULAR PROBE
Candida species: NEGATIVE
Gardnerella vaginalis: POSITIVE — AB
Trichomonas vaginosis: NEGATIVE

## 2016-06-26 LAB — RPR

## 2016-06-26 MED ORDER — ONETOUCH DELICA LANCETS 33G MISC: 1.0000 | Freq: Every day | 3 refills | Status: DC

## 2016-06-27 LAB — GC/CHLAMYDIA PROBE AMP
CT Probe RNA: NOT DETECTED
GC Probe RNA: NOT DETECTED

## 2016-06-27 NOTE — Addendum Note (Signed)
Addended by: Lurlean Nanny on: 06/27/2016 05:08 PM   Modules accepted: Orders

## 2016-06-28 ENCOUNTER — Other Ambulatory Visit: Payer: Self-pay | Admitting: Internal Medicine

## 2016-06-28 ENCOUNTER — Telehealth: Payer: Self-pay

## 2016-06-28 MED ORDER — METRONIDAZOLE 1 % EX GEL: CUTANEOUS | 0 refills | Status: DC

## 2016-06-28 MED ORDER — METRONIDAZOLE 0.75 % EX GEL: 1.0000 "application " | Freq: Two times a day (BID) | CUTANEOUS | 0 refills | Status: DC

## 2016-06-28 NOTE — Telephone Encounter (Signed)
Kayla at [facility] left v/m requesting cb about metronidazole gel; called to verify wanted 0.75% and not the 1%.Please advise.

## 2016-06-28 NOTE — Addendum Note (Signed)
Addended by: Lindalou Hose Y on: 06/28/2016 02:00 PM   Modules accepted: Orders

## 2016-06-28 NOTE — Telephone Encounter (Signed)
I have sent in 1% med

## 2016-06-28 NOTE — Telephone Encounter (Signed)
1% is fine if that it was they have, same directions

## 2016-06-29 ENCOUNTER — Telehealth: Payer: Self-pay

## 2016-06-29 MED ORDER — METRONIDAZOLE 0.75 % VA GEL: 1.0000 | Freq: Two times a day (BID) | VAGINAL | 0 refills | Status: DC

## 2016-06-29 MED ORDER — METRONIDAZOLE 1 % EX GEL: CUTANEOUS | 0 refills | Status: DC

## 2016-06-29 NOTE — Addendum Note (Signed)
Addended by: Lurlean Nanny on: 06/29/2016 02:15 PM   Modules accepted: Orders

## 2016-06-29 NOTE — Telephone Encounter (Signed)
Mel, I'm not sure what is going on with this RX. I prescribed 0.75 initially. Pharmacy called back and asked why it wasn't 1%, you sent in RX. They called back asking if it was vaginal or topical, I advised vaginal, now they are calling back saying it should be 0.75%. Please call them back. I want [id number] I applicatorful BID x 5 days., dispense 1 tube no refills

## 2016-06-29 NOTE — Telephone Encounter (Signed)
vaginal

## 2016-06-29 NOTE — Telephone Encounter (Signed)
Resent with new instructions

## 2016-06-29 NOTE — Telephone Encounter (Signed)
Vicente Males with [facility] left v/m; received the new rx to apply vaginally; the vaginal product only comes in 0.75% gel. Anna request new rx. Comes in 70 gm tube.

## 2016-06-29 NOTE — Telephone Encounter (Signed)
Anna at [facility] left v/m is metronidazole topical or vaginal; pt thinks med is for vaginal use. Anna request cb.

## 2016-06-29 NOTE — Addendum Note (Signed)
Addended by: Lurlean Nanny on: 06/29/2016 11:34 AM   Modules accepted: Orders

## 2016-06-29 NOTE — Telephone Encounter (Signed)
Rx resent.

## 2016-07-04 LAB — HM DIABETES EYE EXAM

## 2016-07-09 ENCOUNTER — Other Ambulatory Visit: Payer: Self-pay | Admitting: Internal Medicine

## 2016-07-09 ENCOUNTER — Encounter: Payer: Self-pay | Admitting: Internal Medicine

## 2016-07-09 DIAGNOSIS — E1142 Type 2 diabetes mellitus with diabetic polyneuropathy: Secondary | ICD-10-CM

## 2016-07-09 DIAGNOSIS — I10 Essential (primary) hypertension: Secondary | ICD-10-CM

## 2016-07-12 ENCOUNTER — Encounter: Payer: Self-pay | Admitting: Internal Medicine

## 2016-07-12 MED ORDER — GLUCOSE BLOOD VI STRP: ORAL_STRIP | 5 refills | Status: DC

## 2016-08-27 ENCOUNTER — Other Ambulatory Visit: Payer: Self-pay | Admitting: Internal Medicine

## 2016-09-09 ENCOUNTER — Other Ambulatory Visit: Payer: Self-pay | Admitting: Internal Medicine

## 2016-09-09 DIAGNOSIS — E1142 Type 2 diabetes mellitus with diabetic polyneuropathy: Secondary | ICD-10-CM

## 2016-09-27 ENCOUNTER — Ambulatory Visit: Payer: Self-pay | Admitting: Internal Medicine

## 2016-11-07 ENCOUNTER — Encounter: Payer: Self-pay | Admitting: Internal Medicine

## 2016-11-26 ENCOUNTER — Encounter: Payer: Self-pay | Admitting: Internal Medicine

## 2016-11-29 NOTE — Telephone Encounter (Signed)
I would say send it to [facility]

## 2016-12-24 ENCOUNTER — Other Ambulatory Visit: Payer: Self-pay | Admitting: Internal Medicine

## 2016-12-24 DIAGNOSIS — E1142 Type 2 diabetes mellitus with diabetic polyneuropathy: Secondary | ICD-10-CM

## 2017-01-23 ENCOUNTER — Other Ambulatory Visit: Payer: Self-pay | Admitting: Internal Medicine

## 2017-01-23 DIAGNOSIS — E1142 Type 2 diabetes mellitus with diabetic polyneuropathy: Secondary | ICD-10-CM

## 2017-01-29 ENCOUNTER — Other Ambulatory Visit: Payer: Self-pay | Admitting: Internal Medicine

## 2017-01-31 ENCOUNTER — Ambulatory Visit: Payer: Medicaid Other | Admitting: Internal Medicine

## 2017-01-31 ENCOUNTER — Encounter: Payer: Self-pay | Admitting: Internal Medicine

## 2017-01-31 NOTE — Progress Notes (Signed)
Pre visit review using our clinic review tool, if applicable. No additional management support is needed unless otherwise documented below in the visit note. 

## 2017-02-12 ENCOUNTER — Other Ambulatory Visit: Payer: Self-pay | Admitting: Internal Medicine

## 2017-02-18 ENCOUNTER — Other Ambulatory Visit: Payer: Self-pay | Admitting: Internal Medicine

## 2017-02-20 ENCOUNTER — Other Ambulatory Visit: Payer: Self-pay | Admitting: Internal Medicine

## 2017-02-20 DIAGNOSIS — E1142 Type 2 diabetes mellitus with diabetic polyneuropathy: Secondary | ICD-10-CM

## 2017-02-27 ENCOUNTER — Other Ambulatory Visit: Payer: Self-pay | Admitting: Internal Medicine

## 2017-02-27 DIAGNOSIS — I10 Essential (primary) hypertension: Secondary | ICD-10-CM

## 2017-09-17 ENCOUNTER — Emergency Department: Payer: Medicaid Other

## 2017-09-17 ENCOUNTER — Encounter: Payer: Self-pay | Admitting: Emergency Medicine

## 2017-09-17 ENCOUNTER — Emergency Department
Admission: EM | Admit: 2017-09-17 | Discharge: 2017-09-17 | Disposition: A | Discharge status: Home / Self Care | Payer: Medicaid Other | Attending: Emergency Medicine | Admitting: Emergency Medicine

## 2017-09-17 DIAGNOSIS — R079 Chest pain, unspecified: Secondary | ICD-10-CM | POA: Insufficient documentation

## 2017-09-17 DIAGNOSIS — Z794 Long term (current) use of insulin: Secondary | ICD-10-CM | POA: Insufficient documentation

## 2017-09-17 DIAGNOSIS — E1140 Type 2 diabetes mellitus with diabetic neuropathy, unspecified: Secondary | ICD-10-CM | POA: Insufficient documentation

## 2017-09-17 DIAGNOSIS — I10 Essential (primary) hypertension: Secondary | ICD-10-CM | POA: Insufficient documentation

## 2017-09-17 DIAGNOSIS — R05 Cough: Secondary | ICD-10-CM | POA: Diagnosis not present

## 2017-09-17 DIAGNOSIS — R059 Cough, unspecified: Secondary | ICD-10-CM

## 2017-09-17 DIAGNOSIS — Z79899 Other long term (current) drug therapy: Secondary | ICD-10-CM | POA: Insufficient documentation

## 2017-09-17 LAB — CBC
HCT: 33.1 % — ABNORMAL LOW (ref 35.0–47.0)
HEMOGLOBIN: 10.8 g/dL — AB (ref 12.0–16.0)
MCH: 30 pg (ref 26.0–34.0)
MCHC: 32.7 g/dL (ref 32.0–36.0)
MCV: 91.7 fL (ref 80.0–100.0)
PLATELETS: 322 10*3/uL (ref 150–440)
RBC: 3.61 MIL/uL — AB (ref 3.80–5.20)
RDW: 14.9 % — ABNORMAL HIGH (ref 11.5–14.5)
WBC: 9 10*3/uL (ref 3.6–11.0)

## 2017-09-17 LAB — BASIC METABOLIC PANEL
Anion gap: 11 (ref 5–15)
BUN: 15 mg/dL (ref 6–20)
CHLORIDE: 101 mmol/L (ref 101–111)
CO2: 23 mmol/L (ref 22–32)
CREATININE: 0.93 mg/dL (ref 0.44–1.00)
Calcium: 9.8 mg/dL (ref 8.9–10.3)
GFR calc Af Amer: >60 mL/min (ref >60)
GFR calc non Af Amer: >60 mL/min (ref >60)
GLUCOSE: 173 mg/dL — AB (ref 65–99)
Potassium: 4.1 mmol/L (ref 3.5–5.1)
Sodium: 135 mmol/L (ref 135–145)

## 2017-09-17 LAB — FIBRIN DERIVATIVES D-DIMER (ARMC ONLY): Fibrin derivatives D-dimer (ARMC): 520.91 ng/mL (FEU) — ABNORMAL HIGH (ref 0.00–499.00)

## 2017-09-17 LAB — TROPONIN I: Troponin I: <0.03 ng/mL (ref <0.03)

## 2017-09-17 MED ORDER — KETOROLAC TROMETHAMINE 30 MG/ML IJ SOLN
30.0000 mg | Freq: Once | INTRAMUSCULAR | Status: AC
  Administered 2017-09-17: 30 mg via INTRAMUSCULAR
  Filled 2017-09-17: qty 1

## 2017-09-17 MED ORDER — SODIUM CHLORIDE 0.9 % IV BOLUS (SEPSIS)
1000.0000 mL | Freq: Once | INTRAVENOUS | Status: AC
  Administered 2017-09-17: 1000 mL via INTRAVENOUS

## 2017-09-17 MED ORDER — IOPAMIDOL (ISOVUE-370) INJECTION 76%
75.0000 mL | Freq: Once | INTRAVENOUS | Status: AC | PRN
  Administered 2017-09-17: 75 mL via INTRAVENOUS
  Filled 2017-09-17: qty 75

## 2017-09-17 MED ORDER — BENZONATATE 100 MG PO CAPS: 100.0000 mg | ORAL_CAPSULE | Freq: Four times a day (QID) | ORAL | 0 refills | Status: DC | PRN

## 2017-09-17 NOTE — ED Triage Notes (Signed)
First Nurse Note:  Arrives via [facility].  AAOx3.  Skin warm and dry.  No SOB/ DOE.  NAD

## 2017-09-17 NOTE — ED Provider Notes (Signed)
[facility] Emergency Department Provider Note  ____________________________________________   First MD Initiated Contact with Patient 09/17/17 1652     (approximate)  I have reviewed the triage vital signs and the nursing notes.   HISTORY  Chief Complaint Chest Pain   HPI Janice Jennings is a 46 y.o. female with a history of diabetes and hypertension was presented with chest pain over the past 2 days.  She says that she has had a dry cough over the past 3 weeks.  She says that about 2 weeks ago she was prescribed amoxicillin for pharyngitis and an ear infection.  Her ear pain and throat pain of down resolved but the throat pain persists.  She says that she is now having sharp pain especially with deep breathing and movement to the anterior chest.  She says that it is associated with shortness of breath.  The pain does not radiate.  Denies any nausea, vomiting or diaphoresis.  Says the pain is severe, especially with movement.    Past Medical History:  Diagnosis Date  . Diabetes mellitus without complication ([place])   . Migraine    Excedrin Tension HA OTC    Patient Active Problem List   Diagnosis Date Noted  . Essential hypertension 03/07/2015  . Obesity (BMI 30-39.9) 10/20/2014  . DM type 2 with diabetic peripheral neuropathy ([place]) 05/26/2014    Past Surgical History:  Procedure Laterality Date  . BREAST REDUCTION SURGERY  2001    Prior to Admission medications   Medication Sig Start Date End Date Taking? Authorizing Provider  Cinnamon 500 MG capsule Take 500 mg by mouth daily.    [provider]  GARCINIA CAMBOGIA-CHROMIUM PO Take 1 capsule by mouth daily.    [provider]  glipiZIDE (GLUCOTROL) 10 MG tablet TAKE 1 TABLET BY MOUTH 2 TIMES DAILY BEFORE A MEAL. 02/20/17   Jearld Fenton, NP  glucose blood (ONE TOUCH ULTRA TEST) test strip Use to check blood sugar two times a day. 07/12/16   Jearld Fenton, NP  Insulin Detemir  (LEVEMIR FLEXTOUCH) 100 UNIT/ML Pen Inject 42 Units into the skin daily at 10 pm. 02/20/17   Jearld Fenton, NP  Insulin Pen Needle 31G X 5 MM MISC Use one pen needle two times daily. 12/21/15   Jearld Fenton, NP  JANUVIA 100 MG tablet TAKE 1 TABLET BY MOUTH DAILY 02/13/17   Jearld Fenton, NP  lisinopril-hydrochlorothiazide (PRINZIDE,ZESTORETIC) 20-25 MG tablet TAKE 1 TABLET BY MOUTH DAILY. 02/27/17   Jearld Fenton, NP  metFORMIN (GLUCOPHAGE) 1000 MG tablet TAKE 1 TABLET (1,000 MG TOTAL) BY MOUTH 2 (TWO) TIMES DAILY WITH A MEAL. 02/20/17   Jearld Fenton, NP  metroNIDAZOLE (METROGEL) 1 % gel 1 application vaginally 2 times daily 06/29/16   Jearld Fenton, NP  Multiple Vitamin (MULTIVITAMIN) tablet Take 1 tablet by mouth daily.    [provider]  [facility] DELICA LANCETS 11X MISC 1 each by Does not apply route daily. 06/26/16   Jearld Fenton, NP  OVER THE COUNTER MEDICATION 1 tablet daily. Keratin vitamin    [provider]  sitaGLIPtin (JANUVIA) 100 MG tablet Take 1 tablet (100 mg total) by mouth daily. MUST SCHEDULE FOLLOW UP OFFICE VISIT 02/18/17   Jearld Fenton, NP    Allergies Patient has no known allergies.  Family History  Problem Relation Age of Onset  . Diabetes Father   . Hyperlipidemia Father   . Hypertension Father   .  Cancer Maternal Aunt 55       leukemia - died  . Diabetes Maternal Grandmother   . Cancer Maternal Grandmother        breast cancer  . Stroke Maternal Grandmother   . Diabetes Daughter        Pre-diabetes  . Diabetes Maternal Grandfather   . Hyperlipidemia Maternal Grandfather   . Hypertension Maternal Grandfather   . Cancer Maternal Aunt 13       breast cancer - died    Social History Social History   Tobacco Use  . Smoking status: Never Smoker  . Smokeless tobacco: Never Used  Substance Use Topics  . Alcohol use: Yes    Alcohol/week: 0.0 oz    Comment: Occasional - maybe 3 times a month  . Drug use: No    Review of  Systems  Constitutional: No fever/chills Eyes: No visual changes. ENT: No sore throat. Cardiovascular: As above Respiratory: Denies shortness of breath. Gastrointestinal: No abdominal pain.  No nausea, no vomiting.  No diarrhea.  No constipation. Genitourinary: Negative for dysuria. Musculoskeletal: Negative for back pain. Skin: Negative for rash. Neurological: Negative for headaches, focal weakness or numbness.   ____________________________________________   PHYSICAL EXAM:  VITAL SIGNS: ED Triage Vitals  Enc Vitals Group     BP 09/17/17 1524 (!) 153/94     Pulse Rate 09/17/17 1524 (!) 102     Resp 09/17/17 1524 15     Temp 09/17/17 1524 98.3 F (36.8 C)     Temp Source 09/17/17 1524 Oral     SpO2 09/17/17 1524 100 %     Weight 09/17/17 1521 189 lb (85.7 kg)     Height 09/17/17 1521 5\' 4"  (1.626 m)     Head Circumference --      Peak Flow --      Pain Score 09/17/17 1716 5     Pain Loc --      Pain Edu? --      Excl. in [place]? --     Constitutional: Alert and oriented.  Patient moaning prior to being placed in the room.  Appearing uncomfortable.  However, during conversation she is able to be calmed. Eyes: Conjunctivae are normal.  Head: Atraumatic. Nose: No congestion/rhinnorhea. Mouth/Throat: Mucous membranes are moist.  Neck: No stridor.   Cardiovascular: Tachycardic, regular rhythm. Grossly normal heart sounds.  No tenderness to palpation to the anterior chest. Respiratory: Normal respiratory effort.  No retractions. Lungs CTAB. Gastrointestinal: Soft and nontender. No distention.  Musculoskeletal: No lower extremity tenderness nor edema.  No joint effusions. Neurologic:  Normal speech and language. No gross focal neurologic deficits are appreciated. Skin:  Skin is warm, dry and intact. No rash noted. Psychiatric: Mood and affect are normal. Speech and behavior are normal.  ____________________________________________   LABS (all labs ordered are listed, but  only abnormal results are displayed)  Labs Reviewed  BASIC METABOLIC PANEL - Abnormal; Notable for the following components:      Result Value   Glucose, Bld 173 (*)    All other components within normal limits  CBC - Abnormal; Notable for the following components:   RBC 3.61 (*)    Hemoglobin 10.8 (*)    HCT 33.1 (*)    RDW 14.9 (*)    All other components within normal limits  FIBRIN DERIVATIVES D-DIMER ([facility] ONLY) - Abnormal; Notable for the following components:   Fibrin derivatives D-dimer ([facility]) 520.91 (*)    All other components within normal  limits  TROPONIN I   ____________________________________________  EKG  ED ECG REPORT I, Doran Stabler, the attending physician, personally viewed and interpreted this ECG.   Date: 09/17/2017  EKG Time: 1524  Rate: 102  Rhythm: sinus tachycardia  Axis: Normal  Intervals:none  ST&T Change: No ST segment elevation or depression.  No abnormal T wave inversion.  ____________________________________________  RADIOLOGY  Mild bibasilar infiltrates  CT of the chest without acute pathology negative for PE. ____________________________________________   PROCEDURES  Procedure(s) performed:   Procedures  Critical Care performed:   ____________________________________________   INITIAL IMPRESSION / ASSESSMENT AND PLAN / ED COURSE  Pertinent labs & imaging results that were available during my care of the patient were reviewed by me and considered in my medical decision making (see chart for details).  Differential diagnosis includes, but is not limited to, ACS, aortic dissection, pulmonary embolism, cardiac tamponade, pneumothorax, pneumonia, pericarditis, myocarditis, GI-related causes including esophagitis/gastritis, and musculoskeletal chest wall pain.    As part of my medical decision making, I reviewed the following data within the [facility] from past office visits       ----------------------------------------- 6:27 PM on 09/17/2017 -----------------------------------------  Patient with elevated d-dimer and improved but persistent chest pain.  Will proceed with CT angiography.  ----------------------------------------- 7:31 PM on 09/17/2017 -----------------------------------------  Patient with pain which is much improved with Toradol.  Reassuring workup.  Likely chest wall pain secondary to ongoing cough.  No pneumonia on the CT.  Will discharge with Tessalon Perles recommended ibuprofen as well as Vicks VapoRub or another rub such as Aspercreme or icy hot at home.  The patient is understanding and willing to comply but will be discharged at this time. ____________________________________________   FINAL CLINICAL IMPRESSION(S) / ED DIAGNOSES  Chest pain.  Cough.    NEW MEDICATIONS STARTED DURING THIS VISIT:  This SmartLink is deprecated. Use AVSMEDLIST instead to display the medication list for a patient.   Note:  This document was prepared using Dragon voice recognition software and may include unintentional dictation errors.     Orbie Pyo, MD 09/17/17 [phone]

## 2017-09-17 NOTE — ED Notes (Signed)
Pt states she has been having chest pain and SOB since Sunday.

## 2017-09-17 NOTE — ED Triage Notes (Addendum)
Patient presents to ED via [facility] from home c/o central chest pain. Patient moaning in triage. Patient states pain is worse on inspiration and worse with movement.

## 2018-03-04 ENCOUNTER — Encounter: Payer: Self-pay | Admitting: Obstetrics and Gynecology

## 2018-03-04 ENCOUNTER — Ambulatory Visit (INDEPENDENT_AMBULATORY_CARE_PROVIDER_SITE_OTHER): Payer: 59 | Admitting: Obstetrics and Gynecology

## 2018-03-04 VITALS — BP 167/100 | HR 92 | Ht 64.0 in | Wt 201.2 lb

## 2018-03-04 DIAGNOSIS — Z1239 Encounter for other screening for malignant neoplasm of breast: Secondary | ICD-10-CM

## 2018-03-04 DIAGNOSIS — Z01419 Encounter for gynecological examination (general) (routine) without abnormal findings: Secondary | ICD-10-CM | POA: Diagnosis not present

## 2018-03-04 DIAGNOSIS — Z202 Contact with and (suspected) exposure to infections with a predominantly sexual mode of transmission: Secondary | ICD-10-CM

## 2018-03-04 DIAGNOSIS — Z1231 Encounter for screening mammogram for malignant neoplasm of breast: Secondary | ICD-10-CM

## 2018-03-04 DIAGNOSIS — D219 Benign neoplasm of connective and other soft tissue, unspecified: Secondary | ICD-10-CM | POA: Diagnosis not present

## 2018-03-04 DIAGNOSIS — Z7689 Persons encountering health services in other specified circumstances: Secondary | ICD-10-CM | POA: Diagnosis not present

## 2018-03-04 DIAGNOSIS — N920 Excessive and frequent menstruation with regular cycle: Secondary | ICD-10-CM

## 2018-03-04 NOTE — Progress Notes (Signed)
HPI:      [name] is a 47 y.o. [phone] who LMP was Patient's last menstrual period was 02/09/2018 (exact date).  Subjective:   She presents today for her annual examination.  She simply has stated that she needs a GYN exam including breast exam and Pap smear. However after her exam showed an enlarged uterus I asked if she had experienced any issues with fibroids and she said that she had been diagnosed with fibroids on the previous ultrasound approximately 3 years ago.  This led to a discussion regarding her menstrual periods which she describes as very heavy but only lasting 4 days.  This also led to discussion of anemia and she states that she was diagnosed with anemia within this last year.  An app on her phone shows her hemoglobin to be 8.  This then led to her discussing the fact that she feels tired and rundown all the time without energy.    Hx: The following portions of the patient's history were reviewed and updated as appropriate:             She  has a past medical history of Diabetes mellitus without complication ([place]) and Migraine. She does not have any pertinent problems on file. She  has a past surgical history that includes Breast reduction surgery (2001); Cesarean section; and Tubal ligation. Her family history includes Breast cancer in her maternal grandmother; Cancer in her maternal grandmother; Cancer (age of onset: 31) in her maternal aunt; Cancer (age of onset: 61) in her maternal aunt; Diabetes in her daughter, father, maternal grandfather, and maternal grandmother; Hyperlipidemia in her father and maternal grandfather; Hypertension in her father and maternal grandfather; Stroke in her maternal grandmother. She  reports that she has never smoked. She has never used smokeless tobacco. She reports that she drinks alcohol. She reports that she does not use drugs. She has a current medication list which includes the following prescription(s): albuterol, cinnamon,  fluconazole, gabapentin, garcinia cambogia-chromium, glucose blood, insulin detemir, insulin glargine, insulin pen needle, lisinopril-hydrochlorothiazide, metformin, multivitamin, onetouch delica lancets 62G, pravastatin, and tiotropium bromide-olodaterol. She has No Known Allergies.       Review of Systems:  Review of Systems  Constitutional: See HPI for additional information.  Eyes: Denied eye symptoms, eye pain, photophobia, vision change and visual disturbance.  Ears/Nose/Throat/Neck: Denied ear, nose, throat or neck symptoms, hearing loss, nasal discharge, sinus congestion and sore throat.  Cardiovascular: Denied cardiovascular symptoms, arrhythmia, chest pain/pressure, edema, exercise intolerance, orthopnea and palpitations.  Respiratory: Denied pulmonary symptoms, asthma, pleuritic pain, productive sputum, cough, dyspnea and wheezing.  Gastrointestinal: Denied, gastro-esophageal reflux, melena, nausea and vomiting.  Genitourinary: See HPI for additional information.  Musculoskeletal: Denied musculoskeletal symptoms, stiffness, swelling, muscle weakness and myalgia.  Dermatologic: Denied dermatology symptoms, rash and scar.  Neurologic: Denied neurology symptoms, dizziness, headache, neck pain and syncope.  Psychiatric: Denied psychiatric symptoms, anxiety and depression.  Endocrine: Denied endocrine symptoms including hot flashes and night sweats.   Meds:   Current Outpatient Medications on File Prior to Visit  Medication Sig Dispense Refill  . albuterol (PROVENTIL HFA;VENTOLIN HFA) 108 (90 Base) MCG/ACT inhaler Inhale into the lungs every 6 (six) hours as needed for wheezing or shortness of breath.    . Cinnamon 500 MG capsule Take 500 mg by mouth daily.    . fluconazole (DIFLUCAN) 150 MG tablet Take 150 mg by mouth daily.    Marland Kitchen gabapentin (NEURONTIN) 300 MG capsule Take 300 mg by mouth 3 (  three) times daily.    Marland Kitchen GARCINIA CAMBOGIA-CHROMIUM PO Take 1 capsule by mouth daily.    Marland Kitchen  glucose blood (ONE TOUCH ULTRA TEST) test strip Use to check blood sugar two times a day. 100 each 5  . Insulin Detemir (LEVEMIR FLEXTOUCH) 100 UNIT/ML Pen Inject 42 Units into the skin daily at 10 pm. 45 mL 0  . Insulin Glargine (BASAGLAR KWIKPEN [place]) Inject into the skin.    . Insulin Pen Needle 31G X 5 MM MISC Use one pen needle two times daily. 200 each 3  . lisinopril-hydrochlorothiazide (PRINZIDE,ZESTORETIC) 20-25 MG tablet TAKE 1 TABLET BY MOUTH DAILY. 90 tablet 0  . metFORMIN (GLUCOPHAGE) 1000 MG tablet TAKE 1 TABLET (1,000 MG TOTAL) BY MOUTH 2 (TWO) TIMES DAILY WITH A MEAL. 180 tablet 0  . Multiple Vitamin (MULTIVITAMIN) tablet Take 1 tablet by mouth daily.    Glory Rosebush DELICA LANCETS 54T MISC 1 each by Does not apply route daily. 100 each 3  . pravastatin (PRAVACHOL) 40 MG tablet Take 40 mg by mouth daily.    . Tiotropium Bromide-Olodaterol (STIOLTO RESPIMAT) 2.5-2.5 MCG/ACT AERS Inhale into the lungs.     No current facility-administered medications on file prior to visit.     Objective:     Vitals:   03/04/18 0924  BP: (!) 167/100  Pulse: 92              Physical examination General NAD, Conversant  HEENT Atraumatic; Op clear with mmm.  Normo-cephalic. Pupils reactive. Anicteric sclerae  Thyroid/Neck Smooth without nodularity or enlargement. Normal ROM.  Neck Supple.  Skin No rashes, lesions or ulceration. Normal palpated skin turgor. No nodularity.  Breasts: No masses or discharge.  Symmetric.  No axillary adenopathy.  Lungs: Clear to auscultation.No rales or wheezes. Normal Respiratory effort, no retractions.  Heart: NSR.  No murmurs or rubs appreciated. No periferal edema  Abdomen: Soft.  Non-tender.  No masses.  No HSM. No hernia  Extremities: Moves all appropriately.  Normal ROM for age. No lymphadenopathy.  Neuro: Oriented to PPT.  Normal mood. Normal affect.     Pelvic:   Vulva: Normal appearance.  No lesions.  Vagina: No lesions or abnormalities noted.   Support: Normal pelvic support.  Urethra No masses tenderness or scarring.  Meatus Normal size without lesions or prolapse.  Cervix: Normal appearance.  No lesions.  Anterior hooded appearance  Anus: Normal exam.  No lesions.  Perineum: Normal exam.  No lesions.        Bimanual   Uterus:  Enlarged to 14 weeks -irregular likely consistent with uterine fibroids  Adnexae: No masses.  Non-tender to palpation.  Cul-de-sac: Negative for abnormality.      Assessment:    [id number] Patient Active Problem List   Diagnosis Date Noted  . Essential hypertension 03/07/2015  . Obesity (BMI 30-39.9) 10/20/2014  . DM type 2 with diabetic peripheral neuropathy ([place]) 05/26/2014     1. Screening for breast cancer   2. Encounter to establish care   3. Well woman exam with routine gynecological exam   4. Exposure to STD   5. Fibroids   6. Menorrhagia with regular cycle     Multiple medical problems including hypertension and not well controlled diabetes.  Uterine fibroids likely causing menorrhagia and subsequent fatigue from anemia.   Plan:            1.  Basic Screening Recommendations The basic screening recommendations for asymptomatic women were discussed with the patient  during her visit.  The age-appropriate recommendations were discussed with her and the rational for the tests reviewed.  When I am informed by the patient that another primary care physician has previously obtained the age-appropriate tests and they are up-to-date, only outstanding tests are ordered and referrals given as necessary.  Abnormal results of tests will be discussed with her when all of her results are completed.  Patient has stated that she will go to a primary care physician for control of her hypertension and diabetes. 2.  Pap-GC/CT performed at patient request because she has a new sexual partner. 3.  Pelvic ultrasound to delineate uterine fibroids and endometrial cavity 4.  CBC, TSH, hemoglobin  A1c  Orders Orders Placed This Encounter  Procedures  . MM DIGITAL SCREENING BILATERAL  . CBC  . TSH  . Hemoglobin A1c  . Ambulatory referral to [facility]    No orders of the defined types were placed in this encounter.       F/U  Return in about 1 week (around 03/11/2018).  Finis Bud, M.D. 03/04/2018 11:41 AM

## 2018-03-05 LAB — CBC
HEMATOCRIT: 30.7 % — AB (ref 34.0–46.6)
Hemoglobin: 10.1 g/dL — ABNORMAL LOW (ref 11.1–15.9)
MCH: 26.9 pg (ref 26.6–33.0)
MCHC: 32.9 g/dL (ref 31.5–35.7)
MCV: 82 fL (ref 79–97)
PLATELETS: 427 10*3/uL — AB (ref 150–379)
RBC: 3.76 x10E6/uL — ABNORMAL LOW (ref 3.77–5.28)
RDW: 15.9 % — AB (ref 12.3–15.4)
WBC: 5.8 10*3/uL (ref 3.4–10.8)

## 2018-03-05 LAB — HEMOGLOBIN A1C
ESTIMATED AVERAGE GLUCOSE: 160 mg/dL
HEMOGLOBIN A1C: 7.2 % — AB (ref 4.8–5.6)

## 2018-03-05 LAB — TSH: TSH: 1.74 u[IU]/mL (ref 0.450–4.500)

## 2018-03-06 ENCOUNTER — Encounter: Payer: Self-pay | Admitting: Obstetrics and Gynecology

## 2018-03-06 LAB — PAP IG, CT-NG, RFX HPV ASCU
Chlamydia, Nuc. Acid Amp: NEGATIVE
Gonococcus by Nucleic Acid Amp: NEGATIVE
PAP SMEAR COMMENT: 0

## 2018-03-10 ENCOUNTER — Other Ambulatory Visit: Payer: Self-pay | Admitting: Obstetrics and Gynecology

## 2018-03-10 DIAGNOSIS — D259 Leiomyoma of uterus, unspecified: Secondary | ICD-10-CM

## 2018-03-13 ENCOUNTER — Encounter: Payer: Self-pay | Admitting: Obstetrics and Gynecology

## 2018-03-13 ENCOUNTER — Ambulatory Visit (INDEPENDENT_AMBULATORY_CARE_PROVIDER_SITE_OTHER): Payer: 59

## 2018-03-13 ENCOUNTER — Ambulatory Visit: Payer: 59 | Admitting: Obstetrics and Gynecology

## 2018-03-13 VITALS — BP 151/83 | HR 105 | Ht 64.0 in | Wt 201.4 lb

## 2018-03-13 DIAGNOSIS — Z30430 Encounter for insertion of intrauterine contraceptive device: Secondary | ICD-10-CM

## 2018-03-13 DIAGNOSIS — D219 Benign neoplasm of connective and other soft tissue, unspecified: Secondary | ICD-10-CM | POA: Diagnosis not present

## 2018-03-13 DIAGNOSIS — D259 Leiomyoma of uterus, unspecified: Secondary | ICD-10-CM

## 2018-03-13 DIAGNOSIS — N920 Excessive and frequent menstruation with regular cycle: Secondary | ICD-10-CM

## 2018-03-13 NOTE — Progress Notes (Signed)
Pt is present today following u/s and receive results.

## 2018-03-13 NOTE — Progress Notes (Signed)
HPI:      [name] is a 47 y.o. [phone] who LMP was Patient's last menstrual period was 03/13/2018.  Subjective:   She presents today for follow-up of her ultrasound for menorrhagia and uterine fibroids.  She is specifically requesting that something be done to help control her menstrual bleeding.     Hx: The following portions of the patient's history were reviewed and updated as appropriate:             She  has a past medical history of Diabetes mellitus without complication ([place]) and Migraine. She does not have any pertinent problems on file. She  has a past surgical history that includes Breast reduction surgery (2001); Cesarean section; and Tubal ligation. Her family history includes Breast cancer in her maternal grandmother; Cancer in her maternal grandmother; Cancer (age of onset: 78) in her maternal aunt; Cancer (age of onset: 78) in her maternal aunt; Diabetes in her daughter, father, maternal grandfather, and maternal grandmother; Hyperlipidemia in her father and maternal grandfather; Hypertension in her father and maternal grandfather; Stroke in her maternal grandmother. She  reports that she has never smoked. She has never used smokeless tobacco. She reports that she drinks alcohol. She reports that she does not use drugs. She has a current medication list which includes the following prescription(s): albuterol, cinnamon, gabapentin, glucose blood, insulin glargine, insulin pen needle, lisinopril-hydrochlorothiazide, metformin, multivitamin, onetouch delica lancets 02O, pravastatin, tiotropium bromide-olodaterol, fluconazole, garcinia cambogia-chromium, and insulin detemir. She has No Known Allergies.       Review of Systems:  Review of Systems  Constitutional: Denied constitutional symptoms, night sweats, recent illness, fatigue, fever, insomnia and weight loss.  Eyes: Denied eye symptoms, eye pain, photophobia, vision change and visual disturbance.   Ears/Nose/Throat/Neck: Denied ear, nose, throat or neck symptoms, hearing loss, nasal discharge, sinus congestion and sore throat.  Cardiovascular: Denied cardiovascular symptoms, arrhythmia, chest pain/pressure, edema, exercise intolerance, orthopnea and palpitations.  Respiratory: Denied pulmonary symptoms, asthma, pleuritic pain, productive sputum, cough, dyspnea and wheezing.  Gastrointestinal: Denied, gastro-esophageal reflux, melena, nausea and vomiting.  Genitourinary: See HPI for additional information.  Musculoskeletal: Denied musculoskeletal symptoms, stiffness, swelling, muscle weakness and myalgia.  Dermatologic: Denied dermatology symptoms, rash and scar.  Neurologic: Denied neurology symptoms, dizziness, headache, neck pain and syncope.  Psychiatric: Denied psychiatric symptoms, anxiety and depression.  Endocrine:  Patient describes occasional hot flashes and night sweats.   Meds:   Current Outpatient Medications on File Prior to Visit  Medication Sig Dispense Refill  . albuterol (PROVENTIL HFA;VENTOLIN HFA) 108 (90 Base) MCG/ACT inhaler Inhale into the lungs every 6 (six) hours as needed for wheezing or shortness of breath.    . Cinnamon 500 MG capsule Take 500 mg by mouth daily.    Marland Kitchen gabapentin (NEURONTIN) 300 MG capsule Take 300 mg by mouth 3 (three) times daily.    Marland Kitchen glucose blood (ONE TOUCH ULTRA TEST) test strip Use to check blood sugar two times a day. 100 each 5  . Insulin Glargine (BASAGLAR KWIKPEN [place]) Inject into the skin.    . Insulin Pen Needle 31G X 5 MM MISC Use one pen needle two times daily. 200 each 3  . lisinopril-hydrochlorothiazide (PRINZIDE,ZESTORETIC) 20-25 MG tablet TAKE 1 TABLET BY MOUTH DAILY. 90 tablet 0  . metFORMIN (GLUCOPHAGE) 1000 MG tablet TAKE 1 TABLET (1,000 MG TOTAL) BY MOUTH 2 (TWO) TIMES DAILY WITH A MEAL. 180 tablet 0  . Multiple Vitamin (MULTIVITAMIN) tablet Take 1 tablet by mouth daily.    Marland Kitchen  ONETOUCH DELICA LANCETS 41U MISC 1 each by Does  not apply route daily. 100 each 3  . pravastatin (PRAVACHOL) 40 MG tablet Take 40 mg by mouth daily.    . Tiotropium Bromide-Olodaterol (STIOLTO RESPIMAT) 2.5-2.5 MCG/ACT AERS Inhale into the lungs.    . fluconazole (DIFLUCAN) 150 MG tablet Take 150 mg by mouth daily.    Marland Kitchen GARCINIA CAMBOGIA-CHROMIUM PO Take 1 capsule by mouth daily.    . Insulin Detemir (LEVEMIR FLEXTOUCH) 100 UNIT/ML Pen Inject 42 Units into the skin daily at 10 pm. (Patient not taking: Reported on 03/13/2018) 45 mL 0   No current facility-administered medications on file prior to visit.     Objective:     Vitals:   03/13/18 1559  BP: (!) 151/83  Pulse: (!) 105    Physical examination   Pelvic:   Vulva: Normal appearance.  No lesions.  Vagina: No lesions or abnormalities noted.  Support: Normal pelvic support.  Urethra No masses tenderness or scarring.  Meatus Normal size without lesions or prolapse.  Cervix: Normal appearance.  No lesions.  Anus: Normal exam.  No lesions.  Perineum: Normal exam.  No lesions.        Bimanual   Uterus: Normal size.  Non-tender.  Mobile.  AV.  Adnexae: No masses.  Non-tender to palpation.  Cul-de-sac: Negative for abnormality.   IUD Procedure Pt has read the booklet and signed the appropriate forms regarding the Mirena IUD.  All of her questions have been answered.   The cervix was cleansed with betadine solution.  After sounding the uterus and noting the position, the IUD was placed in the usual manner without problem.  The string was cut to the appropriate length.  The patient tolerated the procedure well.              Assessment:    [id number] Patient Active Problem List   Diagnosis Date Noted  . Essential hypertension 03/07/2015  . Obesity (BMI 30-39.9) 10/20/2014  . DM type 2 with diabetic peripheral neuropathy ([place]) 05/26/2014     1. Fibroids   2. Menorrhagia with regular cycle     It is likely that her fibroids are causing her dysfunctional uterine bleeding.  We  have discussed her ultrasound results in detail.  Discussion of fibroids. Patient has expressed her desire for IUD for control of her dysfunctional bleeding and menorrhagia.  Because the fibroid is not impinging on the endometrial cavity believe this is appropriate.  We have also discussed her elevated hemoglobin A1c and she is aware of this.    We have discussed her drop in hemoglobin as well and she states that she has just begun taking iron.   Plan:          1.     F/U  Return in about 1 month (around 04/10/2018) for For IUD f/u. I spent 22 minutes involved in the care of this patient of which greater than 50% was spent discussing uterine fibroids, dysfunctional bleeding, signs and symptoms of menopause, IUD risks and benefits procedure of IUD insertion.  Finis Bud, M.D. 03/13/2018 5:03 PM

## 2018-04-10 ENCOUNTER — Ambulatory Visit (INDEPENDENT_AMBULATORY_CARE_PROVIDER_SITE_OTHER): Payer: 59 | Admitting: Obstetrics and Gynecology

## 2018-04-10 ENCOUNTER — Encounter: Payer: Self-pay | Admitting: Obstetrics and Gynecology

## 2018-04-10 VITALS — BP 163/92 | HR 90 | Ht 64.0 in | Wt 200.0 lb

## 2018-04-10 DIAGNOSIS — E113391 Type 2 diabetes mellitus with moderate nonproliferative diabetic retinopathy without macular edema, right eye: Secondary | ICD-10-CM | POA: Diagnosis not present

## 2018-04-10 DIAGNOSIS — Z30431 Encounter for routine checking of intrauterine contraceptive device: Secondary | ICD-10-CM | POA: Diagnosis not present

## 2018-04-10 LAB — HM DIABETES EYE EXAM

## 2018-04-10 NOTE — Progress Notes (Signed)
HPI:      Janice Jennings is a 47 y.o. [id number] who LMP was Patient's last menstrual period was 03/13/2018.  Subjective:   She presents today for IUD check.  She has had almost daily spotting since insertion.  She denies cramps or pain.  She has had intercourse without issue.    Hx: The following portions of the patient's history were reviewed and updated as appropriate:             She  has a past medical history of Diabetes mellitus without complication ([place]) and Migraine. She does not have any pertinent problems on file. She  has a past surgical history that includes Breast reduction surgery (2001); Cesarean section; and Tubal ligation. Her family history includes Breast cancer in her maternal grandmother; Cancer in her maternal grandmother; Cancer (age of onset: 18) in her maternal aunt; Cancer (age of onset: 80) in her maternal aunt; Diabetes in her daughter, father, maternal grandfather, and maternal grandmother; Hyperlipidemia in her father and maternal grandfather; Hypertension in her father and maternal grandfather; Stroke in her maternal grandmother. She  reports that she has never smoked. She has never used smokeless tobacco. She reports that she drinks alcohol. She reports that she does not use drugs. She has a current medication list which includes the following prescription(s): albuterol, vitamin d3, cinnamon, gabapentin, garcinia cambogia-chromium, glucose blood, basaglar kwikpen, insulin pen needle, losartan-hydrochlorothiazide, metformin, multivitamin, onetouch delica lancets 47M, pravastatin, and tiotropium bromide-olodaterol. She has No Known Allergies.       Review of Systems:  Review of Systems  Constitutional: Denied constitutional symptoms, night sweats, recent illness, fatigue, fever, insomnia and weight loss.  Eyes: Denied eye symptoms, eye pain, photophobia, vision change and visual disturbance.  Ears/Nose/Throat/Neck: Denied ear, nose, throat or neck symptoms,  hearing loss, nasal discharge, sinus congestion and sore throat.  Cardiovascular: Denied cardiovascular symptoms, arrhythmia, chest pain/pressure, edema, exercise intolerance, orthopnea and palpitations.  Respiratory: Denied pulmonary symptoms, asthma, pleuritic pain, productive sputum, cough, dyspnea and wheezing.  Gastrointestinal: Denied, gastro-esophageal reflux, melena, nausea and vomiting.  Genitourinary: Denied genitourinary symptoms including symptomatic vaginal discharge, pelvic relaxation issues, and urinary complaints.  Musculoskeletal: Denied musculoskeletal symptoms, stiffness, swelling, muscle weakness and myalgia.  Dermatologic: Denied dermatology symptoms, rash and scar.  Neurologic: Denied neurology symptoms, dizziness, headache, neck pain and syncope.  Psychiatric: Denied psychiatric symptoms, anxiety and depression.  Endocrine: Denied endocrine symptoms including hot flashes and night sweats.   Meds:   Current Outpatient Medications on File Prior to Visit  Medication Sig Dispense Refill  . albuterol (PROVENTIL HFA;VENTOLIN HFA) 108 (90 Base) MCG/ACT inhaler Inhale into the lungs every 6 (six) hours as needed for wheezing or shortness of breath.    . Cholecalciferol (VITAMIN D3) 50000 units CAPS TAKE ONE CAPSULE BY MOUTH ONE TIME PER WEEK  3  . Cinnamon 500 MG capsule Take 500 mg by mouth daily.    Marland Kitchen gabapentin (NEURONTIN) 300 MG capsule Take 300 mg by mouth 3 (three) times daily.    Marland Kitchen GARCINIA CAMBOGIA-CHROMIUM PO Take 1 capsule by mouth daily.    Marland Kitchen glucose blood (ONE TOUCH ULTRA TEST) test strip Use to check blood sugar two times a day. 100 each 5  . Insulin Glargine (BASAGLAR KWIKPEN) 100 UNIT/ML SOPN INJECT 60 UNITS SUBCUTANEOUSLY EVERY DAY  5  . Insulin Pen Needle 31G X 5 MM MISC Use one pen needle two times daily. 200 each 3  . losartan-hydrochlorothiazide (HYZAAR) 50-12.5 MG tablet Take 1 tablet by mouth  daily.    . metFORMIN (GLUCOPHAGE) 1000 MG tablet TAKE 1 TABLET  (1,000 MG TOTAL) BY MOUTH 2 (TWO) TIMES DAILY WITH A MEAL. 180 tablet 0  . Multiple Vitamin (MULTIVITAMIN) tablet Take 1 tablet by mouth daily.    Glory Rosebush DELICA LANCETS 62I MISC 1 each by Does not apply route daily. 100 each 3  . pravastatin (PRAVACHOL) 40 MG tablet Take 40 mg by mouth daily.    . Tiotropium Bromide-Olodaterol (STIOLTO RESPIMAT) 2.5-2.5 MCG/ACT AERS Inhale into the lungs.     No current facility-administered medications on file prior to visit.     Objective:     Vitals:   04/10/18 0839  BP: (!) 163/92  Pulse: 90              Physical examination   Pelvic:   Vulva: Normal appearance.  No lesions.  Vagina: No lesions or abnormalities noted.  Support: Normal pelvic support.  Urethra No masses tenderness or scarring.  Meatus Normal size without lesions or prolapse.  Cervix: Normal appearance.  No lesions. IUD strings noted at cervical os.  Anus: Normal exam.  No lesions.  Perineum: Normal exam.  No lesions.        Bimanual   Uterus: Normal size.  Non-tender.  Mobile.  AV.  Adnexae: No masses.  Non-tender to palpation.  Cul-de-sac: Negative for abnormality.     Assessment:    [id number] Patient Active Problem List   Diagnosis Date Noted  . Essential hypertension 03/07/2015  . Obesity (BMI 30-39.9) 10/20/2014  . DM type 2 with diabetic peripheral neuropathy ([place]) 05/26/2014     1. Surveillance of previously prescribed intrauterine contraceptive device     IUD appears positioned correctly.  I believe that her spotting will resolve in the next few weeks.   Plan:            1.  Expectant management of spotting.  Consider OCPs if patient still bleeding in 2 weeks.  The whole idea of this IUD is cycle/menorrhagia control. Orders No orders of the defined types were placed in this encounter.   No orders of the defined types were placed in this encounter.     F/U  Return for Annual Physical, Pt to contact us if symptoms worsen. I spent 12 minutes  involved in the care of this patient of which greater than 50% was spent discussing IUD, breakthrough bleeding with IUD, possible other symptoms.  Finis Bud, M.D. 04/10/2018 9:04 AM

## 2018-04-11 ENCOUNTER — Encounter: Payer: Self-pay | Admitting: Family Medicine

## 2018-04-11 DIAGNOSIS — E11319 Type 2 diabetes mellitus with unspecified diabetic retinopathy without macular edema: Secondary | ICD-10-CM

## 2018-04-11 HISTORY — DX: Type 2 diabetes mellitus with unspecified diabetic retinopathy without macular edema: E11.319

## 2018-04-20 ENCOUNTER — Encounter: Payer: Self-pay | Admitting: Obstetrics and Gynecology

## 2018-04-25 ENCOUNTER — Encounter: Payer: 59 | Admitting: Obstetrics and Gynecology

## 2018-04-28 DIAGNOSIS — E113393 Type 2 diabetes mellitus with moderate nonproliferative diabetic retinopathy without macular edema, bilateral: Secondary | ICD-10-CM | POA: Diagnosis not present

## 2018-06-19 ENCOUNTER — Encounter: Payer: Self-pay | Admitting: Family Medicine

## 2018-06-19 ENCOUNTER — Ambulatory Visit: Payer: Medicaid Other | Admitting: Family Medicine

## 2018-06-19 VITALS — BP 144/100 | HR 92 | Temp 98.2°F | Ht 63.5 in | Wt 199.0 lb

## 2018-06-19 DIAGNOSIS — Z5181 Encounter for therapeutic drug level monitoring: Secondary | ICD-10-CM

## 2018-06-19 DIAGNOSIS — E669 Obesity, unspecified: Secondary | ICD-10-CM

## 2018-06-19 DIAGNOSIS — E1142 Type 2 diabetes mellitus with diabetic polyneuropathy: Secondary | ICD-10-CM

## 2018-06-19 DIAGNOSIS — D649 Anemia, unspecified: Secondary | ICD-10-CM | POA: Diagnosis not present

## 2018-06-19 DIAGNOSIS — Z23 Encounter for immunization: Secondary | ICD-10-CM

## 2018-06-19 DIAGNOSIS — E7800 Pure hypercholesterolemia, unspecified: Secondary | ICD-10-CM

## 2018-06-19 DIAGNOSIS — I10 Essential (primary) hypertension: Secondary | ICD-10-CM

## 2018-06-19 MED ORDER — FREESTYLE LIBRE READER DEVI: 1.0000 | Freq: Three times a day (TID) | 5 refills | Status: DC

## 2018-06-19 MED ORDER — TIOTROPIUM BROMIDE-OLODATEROL 2.5-2.5 MCG/ACT IN AERS: 1.0000 | INHALATION_SPRAY | Freq: Two times a day (BID) | RESPIRATORY_TRACT | 1 refills | Status: DC

## 2018-06-19 MED ORDER — FREESTYLE LIBRE 14 DAY SENSOR MISC: 1.0000 | Freq: Three times a day (TID) | 5 refills | Status: DC

## 2018-06-19 MED ORDER — BASAGLAR KWIKPEN 100 UNIT/ML ~~LOC~~ SOPN: 50.0000 [IU] | PEN_INJECTOR | Freq: Every day | SUBCUTANEOUS | 1 refills | Status: DC

## 2018-06-19 MED ORDER — GABAPENTIN 300 MG PO CAPS: 300.0000 mg | ORAL_CAPSULE | Freq: Three times a day (TID) | ORAL | 1 refills | Status: DC

## 2018-06-19 MED ORDER — LOSARTAN POTASSIUM-HCTZ 50-12.5 MG PO TABS: 1.0000 | ORAL_TABLET | Freq: Every day | ORAL | 1 refills | Status: DC

## 2018-06-19 MED ORDER — ALBUTEROL SULFATE HFA 108 (90 BASE) MCG/ACT IN AERS: 2.0000 | INHALATION_SPRAY | RESPIRATORY_TRACT | 1 refills | Status: DC | PRN

## 2018-06-19 MED ORDER — METFORMIN HCL 1000 MG PO TABS: 1000.0000 mg | ORAL_TABLET | Freq: Two times a day (BID) | ORAL | 1 refills | Status: DC

## 2018-06-19 NOTE — Assessment & Plan Note (Signed)
Foot exam by MD today; check A1c 

## 2018-06-19 NOTE — Assessment & Plan Note (Addendum)
Continue ARB-thiazide combo; encouraged weight loss, DASH guidelines; patient will return in 2 weeks for recheck; she was not anxious to change medicine, as the last doctor chnaged her BP medicine too

## 2018-06-19 NOTE — Assessment & Plan Note (Signed)
Encouraged weight loss 

## 2018-06-19 NOTE — Assessment & Plan Note (Signed)
Last LDL was 115; goal is less than 70; did not send pravastatin refill as we may change med and/or increase dose to aggressive lower the LDL

## 2018-06-19 NOTE — Progress Notes (Signed)
BP (!) 144/100   Pulse 92   Temp 98.2 F (36.8 C)   Ht 5' 3.5" (1.613 m)   Wt 199 lb (90.3 kg)   LMP 05/29/2018   SpO2 99%   BMI 34.70 kg/m    Subjective:    Patient ID: Janice Jennings, female    DOB: 1971/05/10, 47 y.o.   MRN: [id number]  HPI: Janice Jennings is a 47 y.o. female  Chief Complaint  Patient presents with  . New Patient (Initial Visit)  . Medication Refill    HPI Patient is here to establish care  Type 2 diabetes; on metformin and insulin; since 200; runs in the family; neuropathy and retinoapthy; the insulin is new; she was doing fine on glipizide; she recently found out the prescription said twice a day, but has been using insulin just at night; sugars go down more than they go up; FSBS was as low as 72 and feels something 98 or less  High cholesterol; on pravastatin; trying to limit fatty meats  Hypertension; on ARB-thiazide combo; uses some seasonings with salt in them; not checking BP away from doctor; runs in the family; when she left [place], the new doctor changed a bunch of medicines; she was fine but had to leave last doctor because of insurance; last doctor changed everything  She is not aware of having COPD, but using two inhalers; used to smoke marijuana for one year, so long ago, around 2005 or so; has asthma; no hospital stays; worse with hot and humid weather recently; went to [facility] and was tested; has had both pneumonia vaccines; flu shot today  Anemia; taking iron; she has fibroids; heavy periods; they tried to insert an IUD but her body rejected it; very painful; nothing now; body rejects things; Hgb from outside doctor got down to 8.2, then came up to 10.1; she has never had a colonoscopy ([place] descent)  Obesity; working on weight loss; changed health habits; since July, changed drastically; walking now, daughter is an TEFL teacher  Vitamin D deficiency; was taking 50k weekly  Depression screen [facility] 2/9 06/19/2018 06/25/2016 10/20/2015 07/01/2015  10/26/2013  Decreased Interest 0 0 0 0 0  Down, Depressed, Hopeless 0 0 0 0 0  PHQ - 2 Score 0 0 0 0 0    Relevant past medical, surgical, family and social history reviewed Past Medical History:  Diagnosis Date  . Asthma   . Diabetes mellitus without complication ([place])   . Diabetic retinopathy of both eyes associated with type 2 diabetes mellitus ([place]) 04/11/2018   Moderate to severe, non-proliferative DR; referred to retinal specialist; April 10, 2018  . Hyperlipidemia   . Hypertension   . Migraine    Excedrin Tension HA OTC   Past Surgical History:  Procedure Laterality Date  . BREAST REDUCTION SURGERY  2001  . CESAREAN SECTION    . TUBAL LIGATION     Family History  Problem Relation Age of Onset  . Diabetes Father   . Hyperlipidemia Father   . Hypertension Father   . Cancer Maternal Aunt 29       leukemia - died  . Diabetes Maternal Grandmother   . Cancer Maternal Grandmother        breast cancer  . Stroke Maternal Grandmother   . Breast cancer Maternal Grandmother   . Diabetes Daughter        Pre-diabetes  . Diabetes Maternal Grandfather   . Hyperlipidemia Maternal Grandfather   . Hypertension Maternal Grandfather   .  Cancer Maternal Aunt 50       breast cancer - died  . Ovarian cancer Neg Hx   . Colon cancer Neg Hx    Social History   Tobacco Use  . Smoking status: Never Smoker  . Smokeless tobacco: Never Used  Substance Use Topics  . Alcohol use: Yes    Alcohol/week: 0.0 standard drinks    Comment: Occasional - maybe 3 times a month  . Drug use: No    Interim medical history since last visit reviewed. Allergies and medications reviewed  Review of Systems Per HPI unless specifically indicated above     Objective:    BP (!) 144/100   Pulse 92   Temp 98.2 F (36.8 C)   Ht 5' 3.5" (1.613 m)   Wt 199 lb (90.3 kg)   LMP 05/29/2018   SpO2 99%   BMI 34.70 kg/m   Wt Readings from Last 3 Encounters:  06/19/18 199 lb (90.3 kg)  04/10/18 200 lb  (90.7 kg)  03/13/18 201 lb 6.4 oz (91.4 kg)    Physical Exam  Constitutional: She appears well-developed and well-nourished. No distress.  HENT:  Head: Normocephalic and atraumatic.  Eyes: EOM are normal. No scleral icterus.  Neck: No thyromegaly present.  Cardiovascular: Normal rate, regular rhythm and normal heart sounds.  No murmur heard. Pulmonary/Chest: Effort normal and breath sounds normal. No respiratory distress. She has no wheezes.  Abdominal: Soft. Bowel sounds are normal. She exhibits no distension.  Musculoskeletal: She exhibits no edema.  Neurological: She is alert.  Skin: Skin is warm and dry. She is not diaphoretic. No pallor.  Psychiatric: She has a normal mood and affect. Her behavior is normal. Judgment and thought content normal.   Diabetic Foot Form - Detailed   Diabetic Foot Exam - detailed Diabetic Foot exam was performed with the following findings:  Yes 06/19/2018  9:09 AM  Visual Foot Exam completed.:  Yes  Pulse Foot Exam completed.:  Yes  Right Dorsalis Pedis:  Present Left Dorsalis Pedis:  Present  Sensory Foot Exam Completed.:  Yes Semmes-Weinstein Monofilament Test R Site 1-Great Toe:  Pos L Site 1-Great Toe:  Pos    Comments:  Diminished sensation 5 and 6 on the right, 4 5 and 6 on the left     Results for orders placed or performed in visit on 04/11/18  HM DIABETES EYE EXAM  Result Value Ref Range   HM Diabetic Eye Exam Retinopathy (A) No Retinopathy      Assessment & Plan:   Problem List Items Addressed This Visit      Cardiovascular and Mediastinum   Essential hypertension (Chronic)    Continue ARB-thiazide combo; encouraged weight loss, DASH guidelines; patient will return in 2 weeks for recheck; she was not anxious to change medicine, as the last doctor chnaged her BP medicine too      Relevant Medications   aspirin EC 81 MG tablet   losartan-hydrochlorothiazide (HYZAAR) 50-12.5 MG tablet     Endocrine   DM type 2 with diabetic  peripheral neuropathy ([facility]) - Primary (Chronic)   Relevant Medications   aspirin EC 81 MG tablet   gabapentin (NEURONTIN) 300 MG capsule   Insulin Glargine (BASAGLAR KWIKPEN) 100 UNIT/ML SOPN   metFORMIN (GLUCOPHAGE) 1000 MG tablet   losartan-hydrochlorothiazide (HYZAAR) 50-12.5 MG tablet   Other Relevant Orders   Hemoglobin A1c   Lipid panel   Microalbumin / creatinine urine ratio     Other   Obesity (  BMI 30-39.9) (Chronic)    Encouraged weight loss      Relevant Medications   Insulin Glargine (BASAGLAR KWIKPEN) 100 UNIT/ML SOPN   metFORMIN (GLUCOPHAGE) 1000 MG tablet   High cholesterol (Chronic)    Last LDL was 115; goal is less than 70; did not send pravastatin refill as we may change med and/or increase dose to aggressive lower the LDL      Relevant Medications   aspirin EC 81 MG tablet   losartan-hydrochlorothiazide (HYZAAR) 50-12.5 MG tablet   Other Relevant Orders   Lipid panel   Anemia (Chronic)    Hgb was as low as 8.2 per outside labs (through [place], but not in Epic); up to 10.1 last check; will check labs today to see if ongoing iron therapy indicated; refer for colonoscopy too      Relevant Medications   ferrous sulfate 325 (65 FE) MG tablet   Other Relevant Orders   Ambulatory referral to Gastroenterology   CBC with Differential/Platelet   Fe+TIBC+Fer    Other Visit Diagnoses    Medication monitoring encounter       Relevant Orders   COMPLETE METABOLIC PANEL WITH GFR   Need for immunization against influenza       Relevant Orders   Flu Vaccine QUAD 6+ mos PF IM (Fluarix Quad PF) (Completed)       Follow up plan: Return in about 3 months (around 09/19/2018) for twenty minute follow-up with fasting labs; two weeks for BP check with CMA.  An after-visit summary was printed and given to the patient at [facility].  Please see the patient instructions which may contain other information and recommendations beyond what is mentioned above in the assessment and  plan.  Meds ordered this encounter  Medications  . Continuous Blood Gluc Sensor (FREESTYLE LIBRE 14 DAY SENSOR) MISC    Sig: 1 each by Does not apply route 3 (three) times daily. Check sugars 3x a day; ICD 10 E11.319  LON 99 months    Dispense:  1 each    Refill:  5  . Continuous Blood Gluc Receiver (FREESTYLE LIBRE READER) DEVI    Sig: 1 each by Does not apply route 3 (three) times daily. Check sugars 3x a day; ICD 10 E11.319  LON 99 months    Dispense:  1 Device    Refill:  5  . albuterol (PROVENTIL HFA;VENTOLIN HFA) 108 (90 Base) MCG/ACT inhaler    Sig: Inhale 2 puffs into the lungs every 4 (four) hours as needed for wheezing or shortness of breath.    Dispense:  3 Inhaler    Refill:  1  . gabapentin (NEURONTIN) 300 MG capsule    Sig: Take 1 capsule (300 mg total) by mouth 3 (three) times daily.    Dispense:  270 capsule    Refill:  1  . Insulin Glargine (BASAGLAR KWIKPEN) 100 UNIT/ML SOPN    Sig: Inject 0.5 mLs (50 Units total) into the skin daily at 10 pm.    Dispense:  15 pen    Refill:  1  . metFORMIN (GLUCOPHAGE) 1000 MG tablet    Sig: Take 1 tablet (1,000 mg total) by mouth 2 (two) times daily with a meal.    Dispense:  180 tablet    Refill:  1  . Tiotropium Bromide-Olodaterol (STIOLTO RESPIMAT) 2.5-2.5 MCG/ACT AERS    Sig: Inhale 1 puff into the lungs 2 (two) times daily.    Dispense:  3 Inhaler    Refill:  1  . losartan-hydrochlorothiazide (HYZAAR) 50-12.5 MG tablet    Sig: Take 1 tablet by mouth daily.    Dispense:  90 tablet    Refill:  1    Orders Placed This Encounter  Procedures  . Flu Vaccine QUAD 6+ mos PF IM (Fluarix Quad PF)  . CBC with Differential/Platelet  . COMPLETE METABOLIC PANEL WITH GFR  . Hemoglobin A1c  . Lipid panel  . Fe+TIBC+Fer  . Microalbumin / creatinine urine ratio  . Ambulatory referral to Gastroenterology

## 2018-06-19 NOTE — Assessment & Plan Note (Signed)
Hgb was as low as 8.2 per outside labs (through [facility], but not in Epic); up to 10.1 last check; will check labs today to see if ongoing iron therapy indicated; refer for colonoscopy too

## 2018-06-19 NOTE — Patient Instructions (Addendum)
Try to follow the DASH guidelines (DASH stands for Dietary Approaches to Stop Hypertension). Try to limit the sodium in your diet to no more than 1,500mg  of sodium per day. Certainly try to not exceed 2,000 mg per day at the very most. Do not add salt when cooking or at the table.  Check the sodium amount on labels when shopping, and choose items lower in sodium when given a choice. Avoid or limit foods that already contain a lot of sodium. Eat a diet rich in fruits and vegetables and whole grains, and try to lose weight if overweight or obese  Check out the information at familydoctor.org entitled "Nutrition for Weight Loss: What You Need to Know about Fad Diets" Try to lose between 1-2 pounds per week by taking in fewer calories and burning off more calories You can succeed by limiting portions, limiting foods dense in calories and fat, becoming more active, and drinking 8 glasses of water a day (64 ounces) Don't skip meals, especially breakfast, as skipping meals may alter your metabolism Do not use over-the-counter weight loss pills or gimmicks that claim rapid weight loss A healthy BMI (or body mass index) is between 18.5 and 24.9 You can calculate your ideal BMI at the [place] website ClubMonetize.fr  We'll have you see the GI doctor about the anemia and get a colonoscopy We'll get labs today If you have not heard anything from my staff in a week about any orders/referrals/studies from today, please contact us here to follow-up [phone]   DASH Eating Plan DASH stands for "Dietary Approaches to Stop Hypertension." The DASH eating plan is a healthy eating plan that has been shown to reduce high blood pressure (hypertension). It may also reduce your risk for type 2 diabetes, heart disease, and stroke. The DASH eating plan may also help with weight loss. What are tips for following this plan? General guidelines  Avoid eating more than 2,300  mg (milligrams) of salt (sodium) a day. If you have hypertension, you may need to reduce your sodium intake to 1,500 mg a day.  Limit alcohol intake to no more than 1 drink a day for nonpregnant women and 2 drinks a day for men. One drink equals 12 oz of beer, 5 oz of wine, or 1 oz of hard liquor.  Work with your health care provider to maintain a healthy body weight or to lose weight. Ask what an ideal weight is for you.  Get at least 30 minutes of exercise that causes your heart to beat faster (aerobic exercise) most days of the week. Activities may include walking, swimming, or biking.  Work with your health care provider or diet and nutrition specialist (dietitian) to adjust your eating plan to your individual calorie needs. Reading food labels  Check food labels for the amount of sodium per serving. Choose foods with less than 5 percent of the Daily Value of sodium. Generally, foods with less than 300 mg of sodium per serving fit into this eating plan.  To find whole grains, look for the word "whole" as the first word in the ingredient list. Shopping  Buy products labeled as "low-sodium" or "no salt added."  Buy fresh foods. Avoid canned foods and premade or frozen meals. Cooking  Avoid adding salt when cooking. Use salt-free seasonings or herbs instead of table salt or sea salt. Check with your health care provider or pharmacist before using salt substitutes.  Do not fry foods. Cook foods using healthy methods such as  baking, boiling, grilling, and broiling instead.  Cook with heart-healthy oils, such as olive, canola, soybean, or sunflower oil. Meal planning   Eat a balanced diet that includes: ? 5 or more servings of fruits and vegetables each day. At each meal, try to fill half of your plate with fruits and vegetables. ? Up to 6-8 servings of whole grains each day. ? Less than 6 oz of lean meat, poultry, or fish each day. A 3-oz serving of meat is about the same size as a  deck of cards. One egg equals 1 oz. ? 2 servings of low-fat dairy each day. ? A serving of nuts, seeds, or beans 5 times each week. ? Heart-healthy fats. Healthy fats called Omega-3 fatty acids are found in foods such as flaxseeds and coldwater fish, like sardines, salmon, and mackerel.  Limit how much you eat of the following: ? Canned or prepackaged foods. ? Food that is high in trans fat, such as fried foods. ? Food that is high in saturated fat, such as fatty meat. ? Sweets, desserts, sugary drinks, and other foods with added sugar. ? Full-fat dairy products.  Do not salt foods before eating.  Try to eat at least 2 vegetarian meals each week.  Eat more home-cooked food and less restaurant, buffet, and fast food.  When eating at a restaurant, ask that your food be prepared with less salt or no salt, if possible. What foods are recommended? The items listed may not be a complete list. Talk with your dietitian about what dietary choices are best for you. Grains Whole-grain or whole-wheat bread. Whole-grain or whole-wheat pasta. Brown rice. [place]. Bulgur. Whole-grain and low-sodium cereals. Pita bread. Low-fat, low-sodium crackers. Whole-wheat flour tortillas. Vegetables Fresh or frozen vegetables (raw, steamed, roasted, or grilled). Low-sodium or reduced-sodium tomato and vegetable juice. Low-sodium or reduced-sodium tomato sauce and tomato paste. Low-sodium or reduced-sodium canned vegetables. Fruits All fresh, dried, or frozen fruit. Canned fruit in natural juice (without added sugar). Meat and other protein foods Skinless chicken or [place]. Ground chicken or [place]. Pork with fat trimmed off. Fish and seafood. Egg whites. Dried beans, peas, or lentils. Unsalted nuts, nut butters, and seeds. Unsalted canned beans. Lean cuts of beef with fat trimmed off. Low-sodium, lean deli meat. Dairy Low-fat (1%) or fat-free (skim) milk. Fat-free, low-fat, or reduced-fat cheeses. Nonfat,  low-sodium ricotta or cottage cheese. Low-fat or nonfat yogurt. Low-fat, low-sodium cheese. Fats and oils Soft margarine without trans fats. Vegetable oil. Low-fat, reduced-fat, or light mayonnaise and salad dressings (reduced-sodium). Canola, safflower, olive, soybean, and sunflower oils. Avocado. Seasoning and other foods Herbs. Spices. Seasoning mixes without salt. Unsalted popcorn and pretzels. Fat-free sweets. What foods are not recommended? The items listed may not be a complete list. Talk with your dietitian about what dietary choices are best for you. Grains Baked goods made with fat, such as croissants, muffins, or some breads. Dry pasta or rice meal packs. Vegetables Creamed or fried vegetables. Vegetables in a cheese sauce. Regular canned vegetables (not low-sodium or reduced-sodium). Regular canned tomato sauce and paste (not low-sodium or reduced-sodium). Regular tomato and vegetable juice (not low-sodium or reduced-sodium). Angie Fava. Olives. Fruits Canned fruit in a light or heavy syrup. Fried fruit. Fruit in cream or butter sauce. Meat and other protein foods Fatty cuts of meat. Ribs. Fried meat. Berniece Salines. Sausage. Bologna and other processed lunch meats. Salami. Fatback. Hotdogs. Bratwurst. Salted nuts and seeds. Canned beans with added salt. Canned or smoked fish. Whole eggs or egg  yolks. Chicken or [place] with skin. Dairy Whole or 2% milk, cream, and half-and-half. Whole or full-fat cream cheese. Whole-fat or sweetened yogurt. Full-fat cheese. Nondairy creamers. Whipped toppings. Processed cheese and cheese spreads. Fats and oils Butter. Stick margarine. Lard. Shortening. Ghee. Bacon fat. Tropical oils, such as coconut, palm kernel, or palm oil. Seasoning and other foods Salted popcorn and pretzels. Onion salt, garlic salt, seasoned salt, table salt, and sea salt. Worcestershire sauce. Tartar sauce. Barbecue sauce. Teriyaki sauce. Soy sauce, including reduced-sodium. Steak sauce.  Canned and packaged gravies. Fish sauce. Oyster sauce. Cocktail sauce. Horseradish that you find on the shelf. Ketchup. Mustard. Meat flavorings and tenderizers. Bouillon cubes. Hot sauce and Tabasco sauce. Premade or packaged marinades. Premade or packaged taco seasonings. Relishes. Regular salad dressings. Where to find more information:  National Heart, Lung, and [place]: https://wilson-eaton.com/  American Heart Association: www.heart.org Summary  The DASH eating plan is a healthy eating plan that has been shown to reduce high blood pressure (hypertension). It may also reduce your risk for type 2 diabetes, heart disease, and stroke.  With the DASH eating plan, you should limit salt (sodium) intake to 2,300 mg a day. If you have hypertension, you may need to reduce your sodium intake to 1,500 mg a day.  When on the DASH eating plan, aim to eat more fresh fruits and vegetables, whole grains, lean proteins, low-fat dairy, and heart-healthy fats.  Work with your health care provider or diet and nutrition specialist (dietitian) to adjust your eating plan to your individual calorie needs. This information is not intended to replace advice given to you by your health care provider. Make sure you discuss any questions you have with your health care provider. Document Released: 10/04/2011 Document Revised: 10/08/2016 Document Reviewed: 10/08/2016 Elsevier Interactive Patient Education  2018 [place]  Preventing Unhealthy [employer], Adult Staying at a healthy weight is important. When fat builds up in your body, you may become overweight or obese. These conditions put you at greater risk for developing certain health problems, such as heart disease, diabetes, sleeping problems, joint problems, and some cancers. Unhealthy weight gain is often the result of making unhealthy choices in what you eat. It is also a result of not getting enough exercise. You can make changes to your lifestyle to  prevent obesity and stay as healthy as possible. What nutrition changes can be made? To maintain a healthy weight and prevent obesity:  Eat only as much as your body needs. To do this: ? Pay attention to signs that you are hungry or full. Stop eating as soon as you feel full. ? If you feel hungry, try drinking water first. Drink enough water so your urine is clear or pale yellow. ? Eat smaller portions. ? Look at serving sizes on food labels. Most foods contain more than one serving per container. ? Eat the recommended amount of calories for your gender and activity level. While most active people should eat around 2,000 calories per day, if you are trying to lose weight or are not very active, you main need to eat less calories. Talk to your health care provider or dietitian about how many calories you should eat each day.  Choose healthy foods, such as: ? Fruits and vegetables. Try to fill at least half of your plate at each meal with fruits and vegetables. ? Whole grains, such as whole wheat bread, brown rice, and quinoa. ? Lean meats, such as chicken or fish. ? Other healthy proteins,  such as beans, eggs, or tofu. ? Healthy fats, such as nuts, seeds, fatty fish, and olive oil. ? Low-fat or fat-free dairy.  Check food labels and avoid food and drinks that: ? Are high in calories. ? Have added sugar. ? Are high in sodium. ? Have saturated fats or trans fats.  Limit how much you eat of the following foods: ? Prepackaged meals. ? Fast food. ? Fried foods. ? Processed meat, such as bacon, sausage, and deli meats. ? Fatty cuts of red meat and poultry with skin.  Cook foods in healthier ways, such as by baking, broiling, or grilling.  When grocery shopping, try to shop around the outside of the store. This helps you buy mostly fresh foods and avoid canned and prepackaged foods.  What lifestyle changes can be made?  Exercise at least 30 minutes 5 or more days each week. Exercising  includes brisk walking, yard work, biking, running, swimming, and team sports like basketball and soccer. Ask your health care provider which exercises are safe for you.  Do not use any products that contain nicotine or tobacco, such as cigarettes and e-cigarettes. If you need help quitting, ask your health care provider.  Limit alcohol intake to no more than 1 drink a day for nonpregnant women and 2 drinks a day for men. One drink equals 12 oz of beer, 5 oz of wine, or 1 oz of hard liquor.  Try to get 7-9 hours of sleep each night. What other changes can be made?  Keep a food and activity journal to keep track of: ? What you ate and how many calories you had. Remember to count sauces, dressings, and side dishes. ? Whether you were active, and what exercises you did. ? Your calorie, weight, and activity goals.  Check your weight regularly. Track any changes. If you notice you have gained weight, make changes to your diet or activity routine.  Avoid taking weight-loss medicines or supplements. Talk to your health care provider before starting any new medicine or supplement.  Talk to your health care provider before trying any new diet or exercise plan. Why are these changes important? Eating healthy, staying active, and having healthy habits not only help prevent obesity, they also:  Help you to manage stress and emotions.  Help you to connect with friends and family.  Improve your self-esteem.  Improve your sleep.  Prevent long-term health problems.  What can happen if changes are not made? Being obese or overweight can cause you to develop joint or bone problems, which can make it hard for you to stay active or do activities you enjoy. Being obese or overweight also puts stress on your heart and lungs and can lead to health problems like diabetes, heart disease, and some cancers. Where to find more information: Talk with your health care provider or a dietitian about healthy  eating and healthy lifestyle choices. You may also find other information through these resources:  U.S. Department of Agriculture MyPlate: FormerBoss.no  American Heart Association: www.heart.org  Centers for Disease Control and Prevention: http://www.wolf.info/  Summary  Staying at a healthy weight is important. It helps prevent certain diseases and health problems, such as heart disease, diabetes, joint problems, sleep disorders, and some cancers.  Being obese or overweight can cause you to develop joint or bone problems, which can make it hard for you to stay active or do activities you enjoy.  You can prevent unhealthy weight gain by eating a healthy diet, exercising regularly,  not smoking, limiting alcohol, and getting enough sleep.  Talk with your health care provider or a dietitian for guidance about healthy eating and healthy lifestyle choices. This information is not intended to replace advice given to you by your health care provider. Make sure you discuss any questions you have with your health care provider. Document Released: 10/16/2016 Document Revised: 11/21/2016 Document Reviewed: 11/21/2016 Elsevier Interactive Patient Education  [employer].

## 2018-06-20 ENCOUNTER — Other Ambulatory Visit: Payer: Self-pay | Admitting: Family Medicine

## 2018-06-20 DIAGNOSIS — Z1231 Encounter for screening mammogram for malignant neoplasm of breast: Secondary | ICD-10-CM

## 2018-06-20 LAB — MICROALBUMIN / CREATININE URINE RATIO
CREATININE, URINE: 78 mg/dL (ref 20–275)
Microalb Creat Ratio: 1200 mcg/mg creat — ABNORMAL HIGH (ref <30)
Microalb, Ur: 93.6 mg/dL

## 2018-06-20 LAB — LIPID PANEL
Cholesterol: 214 mg/dL — ABNORMAL HIGH (ref <200)
HDL: 49 mg/dL — ABNORMAL LOW (ref >50)
LDL Cholesterol (Calc): 140 mg/dL (calc) — ABNORMAL HIGH
Non-HDL Cholesterol (Calc): 165 mg/dL (calc) — ABNORMAL HIGH (ref <130)
TRIGLYCERIDES: 124 mg/dL (ref <150)
Total CHOL/HDL Ratio: 4.4 (calc) (ref <5.0)

## 2018-06-20 LAB — COMPLETE METABOLIC PANEL WITH GFR
AG RATIO: 1.2 (calc) (ref 1.0–2.5)
ALT: 10 U/L (ref 6–29)
AST: 11 U/L (ref 10–35)
Albumin: 3.7 g/dL (ref 3.6–5.1)
Alkaline phosphatase (APISO): 58 U/L (ref 33–115)
BUN/Creatinine Ratio: 19 (calc) (ref 6–22)
BUN: 26 mg/dL — ABNORMAL HIGH (ref 7–25)
CALCIUM: 9.3 mg/dL (ref 8.6–10.2)
CO2: 27 mmol/L (ref 20–32)
Chloride: 104 mmol/L (ref 98–110)
Creat: 1.34 mg/dL — ABNORMAL HIGH (ref 0.50–1.10)
GFR, EST NON AFRICAN AMERICAN: 46 mL/min/{1.73_m2} — AB (ref >=60)
GFR, Est African American: 54 mL/min/{1.73_m2} — ABNORMAL LOW (ref >=60)
GLUCOSE: 130 mg/dL — AB (ref 65–99)
Globulin: 3.1 g/dL (calc) (ref 1.9–3.7)
POTASSIUM: 4.5 mmol/L (ref 3.5–5.3)
Sodium: 140 mmol/L (ref 135–146)
Total Bilirubin: 0.5 mg/dL (ref 0.2–1.2)
Total Protein: 6.8 g/dL (ref 6.1–8.1)

## 2018-06-20 LAB — IRON,TIBC AND FERRITIN PANEL
%SAT: 26 % (calc) (ref 16–45)
FERRITIN: 16 ng/mL (ref 16–232)
IRON: 80 ug/dL (ref 40–190)
TIBC: 310 ug/dL (ref 250–450)

## 2018-06-20 LAB — CBC WITH DIFFERENTIAL/PLATELET
BASOS PCT: 1.1 %
Basophils Absolute: 58 cells/uL (ref 0–200)
EOS ABS: 170 {cells}/uL (ref 15–500)
Eosinophils Relative: 3.2 %
HCT: 34.7 % — ABNORMAL LOW (ref 35.0–45.0)
Hemoglobin: 11.7 g/dL (ref 11.7–15.5)
Lymphs Abs: 1786 cells/uL (ref 850–3900)
MCH: 30.2 pg (ref 27.0–33.0)
MCHC: 33.7 g/dL (ref 32.0–36.0)
MCV: 89.7 fL (ref 80.0–100.0)
MONOS PCT: 7.4 %
MPV: 11 fL (ref 7.5–12.5)
NEUTROS PCT: 54.6 %
Neutro Abs: 2894 cells/uL (ref 1500–7800)
PLATELETS: 318 10*3/uL (ref 140–400)
RBC: 3.87 10*6/uL (ref 3.80–5.10)
RDW: 13.1 % (ref 11.0–15.0)
TOTAL LYMPHOCYTE: 33.7 %
WBC mixed population: 392 cells/uL (ref 200–950)
WBC: 5.3 10*3/uL (ref 3.8–10.8)

## 2018-06-20 LAB — HEMOGLOBIN A1C
EAG (MMOL/L): 7.9 (calc)
Hgb A1c MFr Bld: 6.6 % of total Hgb — ABNORMAL HIGH (ref <5.7)
MEAN PLASMA GLUCOSE: 143 (calc)

## 2018-06-22 ENCOUNTER — Other Ambulatory Visit: Payer: Self-pay | Admitting: Family Medicine

## 2018-06-22 DIAGNOSIS — E7800 Pure hypercholesterolemia, unspecified: Secondary | ICD-10-CM

## 2018-06-22 DIAGNOSIS — R809 Proteinuria, unspecified: Secondary | ICD-10-CM

## 2018-06-22 DIAGNOSIS — N289 Disorder of kidney and ureter, unspecified: Secondary | ICD-10-CM

## 2018-06-22 NOTE — Progress Notes (Signed)
Nephro referral Note to patient to consider switching from pravastatin to atorvastatin 40 Janice Jennings, please send Rx if she agrees) Recheck BMP in 1-2 weeks Recheck lipids in 6-8 weeks

## 2018-06-26 ENCOUNTER — Encounter: Payer: Self-pay | Admitting: Family Medicine

## 2018-07-03 ENCOUNTER — Other Ambulatory Visit: Payer: Self-pay | Admitting: Nurse Practitioner

## 2018-07-03 ENCOUNTER — Ambulatory Visit: Payer: 59

## 2018-07-03 ENCOUNTER — Encounter: Payer: Self-pay | Admitting: Family Medicine

## 2018-07-03 ENCOUNTER — Telehealth: Payer: Self-pay

## 2018-07-03 ENCOUNTER — Telehealth: Payer: Self-pay | Admitting: Nurse Practitioner

## 2018-07-03 VITALS — BP 162/102 | HR 85

## 2018-07-03 DIAGNOSIS — N289 Disorder of kidney and ureter, unspecified: Secondary | ICD-10-CM

## 2018-07-03 DIAGNOSIS — I10 Essential (primary) hypertension: Secondary | ICD-10-CM

## 2018-07-03 MED ORDER — AMLODIPINE BESYLATE 5 MG PO TABS: 5.0000 mg | ORAL_TABLET | Freq: Every day | ORAL | 0 refills | Status: DC

## 2018-07-03 MED ORDER — VALSARTAN 160 MG PO TABS: 160.0000 mg | ORAL_TABLET | Freq: Every day | ORAL | 0 refills | Status: DC

## 2018-07-03 NOTE — Telephone Encounter (Signed)
Please contact this patient and inform her Dr. Sanda Klein is out of office but I have reviewed her messages. Since her losartan in no longer available I have sent valsartan 160mg  and amlodipine 5mg  or you to take daily to your local pharmacy to start taking today. I would like for you to come in for an in person office visit to discuss blood pressure. ________ Please review heart attack and stroke symptoms with patient and cue up any medications she needs sent to another pharmacy (I think it was her inhalers). Thanks

## 2018-07-03 NOTE — Telephone Encounter (Signed)
Pt states her bp med is on back order and has been out.  Can you please send in new rx?

## 2018-07-04 NOTE — Telephone Encounter (Signed)
Left detailed voicemail

## 2018-07-08 ENCOUNTER — Other Ambulatory Visit: Payer: Self-pay | Admitting: Family Medicine

## 2018-07-08 MED ORDER — ATORVASTATIN CALCIUM 40 MG PO TABS: 40.0000 mg | ORAL_TABLET | Freq: Every day | ORAL | 0 refills | Status: DC

## 2018-07-08 MED ORDER — ALBUTEROL SULFATE HFA 108 (90 BASE) MCG/ACT IN AERS: 2.0000 | INHALATION_SPRAY | RESPIRATORY_TRACT | 1 refills | Status: DC | PRN

## 2018-07-08 MED ORDER — METRONIDAZOLE 0.75 % EX GEL: 1.0000 "application " | Freq: Two times a day (BID) | CUTANEOUS | 1 refills | Status: DC

## 2018-07-08 MED ORDER — TIOTROPIUM BROMIDE-OLODATEROL 2.5-2.5 MCG/ACT IN AERS: 1.0000 | INHALATION_SPRAY | Freq: Two times a day (BID) | RESPIRATORY_TRACT | 5 refills | Status: DC

## 2018-07-09 NOTE — Telephone Encounter (Signed)
I just approved this yesterday

## 2018-07-14 ENCOUNTER — Ambulatory Visit
Admission: RE | Admit: 2018-07-14 | Discharge: 2018-07-14 | Disposition: A | Discharge status: Home / Self Care | Payer: 59 | Source: Ambulatory Visit | Attending: Family Medicine | Admitting: Family Medicine

## 2018-07-14 DIAGNOSIS — Z1231 Encounter for screening mammogram for malignant neoplasm of breast: Secondary | ICD-10-CM | POA: Insufficient documentation

## 2018-07-28 ENCOUNTER — Other Ambulatory Visit: Payer: Self-pay | Admitting: Nurse Practitioner

## 2018-07-28 DIAGNOSIS — N289 Disorder of kidney and ureter, unspecified: Secondary | ICD-10-CM

## 2018-07-28 DIAGNOSIS — I10 Essential (primary) hypertension: Secondary | ICD-10-CM

## 2018-07-30 ENCOUNTER — Encounter: Payer: Self-pay | Admitting: Gastroenterology

## 2018-07-30 ENCOUNTER — Other Ambulatory Visit: Payer: Self-pay

## 2018-07-30 ENCOUNTER — Telehealth: Payer: Self-pay

## 2018-07-30 ENCOUNTER — Telehealth: Payer: Self-pay | Admitting: Gastroenterology

## 2018-07-30 ENCOUNTER — Ambulatory Visit: Payer: 59 | Admitting: Gastroenterology

## 2018-07-30 VITALS — BP 135/84 | HR 87 | Ht 63.0 in | Wt 201.2 lb

## 2018-07-30 DIAGNOSIS — D649 Anemia, unspecified: Secondary | ICD-10-CM

## 2018-07-30 NOTE — Telephone Encounter (Signed)
Spoke with pt regarding changing colonoscopy procedure to a different date. Pt agreed with changing date to 08-15-18.

## 2018-07-30 NOTE — Progress Notes (Signed)
Jonathon Bellows MD, MRCP(U.K) [place]  [place], [place] [address]  Main: [phone]  Fax: [phone]   Gastroenterology Consultation  Referring Provider:     Arnetha Courser, MD Primary Care Physician:  Arnetha Courser, MD Primary Gastroenterologist:  Dr. Jonathon Bellows  Reason for Consultation:     Anemia         HPI:   Janice Jennings is a 47 y.o. y/o female referred for consultation & management  by Dr. Sanda Klein, Satira Anis, MD.    She has been referred for anemia . New onset since last 10 months , was 10.8 grams with MCV 91. Rise in creatinine last 1 month. Ferritin low normal at 16 .  Was incidentally found to be anemic 10 months back. She has been on iron past few months. Has helped with her Hb.   She has not seen any blood anywhere . She says she has heavy period. She has her periods twice a month in April and had an attempt of IUD but her body rejected it, bled for 2 months after that , last period was just spots. No noose bleeds, no blood in her urine, no blood in her stool. No family history of colon cancer. Never had a colonoscopy .  Was having heavy periods for a few years. Says she has uterine fibroids. .Was taking Alleve often and was told to stop .   CBC Latest Ref Rng & Units 06/19/2018 03/04/2018 09/17/2017  WBC 3.8 - 10.8 Thousand/uL 5.3 5.8 9.0  Hemoglobin 11.7 - 15.5 g/dL 11.7 10.1(L) 10.8(L)  Hematocrit 35.0 - 45.0 % 34.7(L) 30.7(L) 33.1(L)  Platelets 140 - 400 Thousand/uL 318 427(H) 322         Past Medical History:  Diagnosis Date  . Asthma   . Diabetes mellitus without complication ([place])   . Diabetic retinopathy of both eyes associated with type 2 diabetes mellitus ([place]) 04/11/2018   Moderate to severe, non-proliferative DR; referred to retinal specialist; April 10, 2018  . Hyperlipidemia   . Hypertension   . Migraine    Excedrin Tension HA OTC    Past Surgical History:  Procedure Laterality Date  . BREAST REDUCTION SURGERY  2001    . CESAREAN SECTION    . REDUCTION MAMMAPLASTY Bilateral 2000  . TUBAL LIGATION      Prior to Admission medications   Medication Sig Start Date End Date Taking? Authorizing Provider  albuterol (PROVENTIL HFA;VENTOLIN HFA) 108 (90 Base) MCG/ACT inhaler Inhale 2 puffs into the lungs every 4 (four) hours as needed for wheezing or shortness of breath. 07/08/18   Arnetha Courser, MD  amLODipine (NORVASC) 5 MG tablet TAKE 1 TABLET BY MOUTH EVERY DAY 07/28/18   Poulose, Bethel Born, NP  aspirin EC 81 MG tablet Take 81 mg by mouth daily.    [provider]  atorvastatin (LIPITOR) 40 MG tablet Take 1 tablet (40 mg total) by mouth at bedtime. 07/08/18   Lada, Satira Anis, MD  Cinnamon 500 MG capsule Take 500 mg by mouth daily.    [provider]  Continuous Blood Gluc Receiver (FREESTYLE LIBRE READER) DEVI 1 each by Does not apply route 3 (three) times daily. Check sugars 3x a day; ICD 10 E11.319  LON 99 months 06/19/18   Arnetha Courser, MD  Continuous Blood Gluc Sensor (FREESTYLE LIBRE 14 DAY SENSOR) MISC 1 each by Does not apply route 3 (three) times daily. Check sugars 3x a day; ICD  10 E11.319  LON 99 months 06/19/18   Arnetha Courser, MD  ferrous sulfate 325 (65 FE) MG tablet Take 325 mg by mouth daily with breakfast.    [provider]  gabapentin (NEURONTIN) 300 MG capsule Take 1 capsule (300 mg total) by mouth 3 (three) times daily. 06/19/18   Arnetha Courser, MD  GARCINIA CAMBOGIA-CHROMIUM PO Take 1 capsule by mouth daily.    [provider]  glucose blood (ONE TOUCH ULTRA TEST) test strip Use to check blood sugar two times a day. 07/12/16   Jearld Fenton, NP  Insulin Glargine (BASAGLAR KWIKPEN) 100 UNIT/ML SOPN Inject 0.5 mLs (50 Units total) into the skin daily at 10 pm. 06/19/18   Lada, Satira Anis, MD  Insulin Pen Needle 31G X 5 MM MISC Use one pen needle two times daily. 12/21/15   Jearld Fenton, NP  metFORMIN (GLUCOPHAGE) 1000 MG tablet Take 1 tablet (1,000 mg  total) by mouth 2 (two) times daily with a meal. 06/19/18   Lada, Satira Anis, MD  metroNIDAZOLE (METROGEL) 0.75 % gel Apply 1 application topically 2 (two) times daily. 07/08/18   Arnetha Courser, MD  Multiple Vitamin (MULTIVITAMIN) tablet Take 1 tablet by mouth daily.    [provider]  [facility] DELICA LANCETS 16X MISC 1 each by Does not apply route daily. 06/26/16   Jearld Fenton, NP  Tiotropium Bromide-Olodaterol (STIOLTO RESPIMAT) 2.5-2.5 MCG/ACT AERS Inhale 1 puff into the lungs 2 (two) times daily. 07/08/18   Arnetha Courser, MD  valsartan (DIOVAN) 160 MG tablet TAKE 1 TABLET BY MOUTH EVERY DAY 07/28/18   Poulose, Bethel Born, NP    Family History  Problem Relation Age of Onset  . Diabetes Father   . Hyperlipidemia Father   . Hypertension Father   . Cancer Maternal Aunt 40       leukemia - died  . Diabetes Maternal Grandmother   . Cancer Maternal Grandmother        breast cancer  . Stroke Maternal Grandmother   . Breast cancer Maternal Grandmother   . Diabetes Daughter        Pre-diabetes  . Diabetes Maternal Grandfather   . Hyperlipidemia Maternal Grandfather   . Hypertension Maternal Grandfather   . Cancer Maternal Aunt 50       breast cancer - died  . Ovarian cancer Neg Hx   . Colon cancer Neg Hx      Social History   Tobacco Use  . Smoking status: Never Smoker  . Smokeless tobacco: Never Used  Substance Use Topics  . Alcohol use: Yes    Alcohol/week: 0.0 standard drinks    Comment: Occasional - maybe 3 times a month  . Drug use: No    Allergies as of 07/30/2018  . (No Known Allergies)    Review of Systems:    All systems reviewed and negative except where noted in HPI.   Physical Exam:  There were no vitals taken for this visit. No LMP recorded. (Menstrual status: Irregular Periods). Psych:  Alert and cooperative. Normal mood and affect. General:   Alert,  Well-developed, well-nourished, pleasant and cooperative in NAD Head:  Normocephalic and  atraumatic. Eyes:  Sclera clear, no icterus.   Conjunctiva pink. Ears:  Normal auditory acuity. Nose:  No deformity, discharge, or lesions. Mouth:  No deformity or lesions,oropharynx pink & moist. Neck:  Supple; no masses or thyromegaly. Lungs:  Respirations even and unlabored.  Clear throughout to auscultation.  No wheezes, crackles, or rhonchi. No acute distress. Heart:  Regular rate and rhythm; no murmurs, clicks, rubs, or gallops. Abdomen:  Normal bowel sounds.  No bruits.  Soft, non-tender and non-distended without masses, hepatosplenomegaly or hernias noted.  No guarding or rebound tenderness.    Extremities:  No clubbing or edema.  No cyanosis. Neurologic:  Alert and oriented x3;  grossly normal neurologically. Skin:  Intact without significant lesions or rashes. No jaundice. Lymph Nodes:  No significant cervical adenopathy. Psych:  Alert and cooperative. Normal mood and affect.  Imaging Studies: Mm 3d Screen Breast Bilateral  Result Date: 07/14/2018 CLINICAL DATA:  Screening. EXAM: DIGITAL SCREENING BILATERAL MAMMOGRAM WITH TOMO AND CAD COMPARISON:  Previous exam(s). ACR Breast Density Category b: There are scattered areas of fibroglandular density. FINDINGS: There are no findings suspicious for malignancy. Images were processed with CAD. IMPRESSION: No mammographic evidence of malignancy. A result letter of this screening mammogram will be mailed directly to the patient. RECOMMENDATION: Screening mammogram in one year. (Code:SM-B-01Y) BI-RADS CATEGORY  1: Negative. Electronically Signed   By: Lillia Mountain M.D.   On: 07/14/2018 09:26    Assessment and Plan:   Janice Jennings is a 47 y.o. y/o female has been referred for  anemia, Labs suggest began 10 months back , normocytic but has a low normal ferritin suggesting an element of iron deficiency. Likely due to menorrhagia and effects of NSAID;s  Plan  1. EGD+ colonoscopy - if negative capsule study of the small bowel  2. No  NSAID's 3. Check urine for blood loss, celiac serology , b12 and folate.  4. Continue oral iron   I have discussed alternative options, risks & benefits,  which include, but are not limited to, bleeding, infection, perforation,respiratory complication & drug reaction.  The patient agrees with this plan & written consent will be obtained.    Follow up in 6-8 weeks   Dr Jonathon Bellows MD,MRCP(U.K)

## 2018-07-30 NOTE — Telephone Encounter (Signed)
Called pt to discuss changing colonoscopy procedure date due to limited time slot on current expected date. LVM to return call

## 2018-07-30 NOTE — Telephone Encounter (Signed)
Pt left vm to confirmed procedure for 10/18

## 2018-07-31 LAB — URINALYSIS
Bilirubin, UA: NEGATIVE
Glucose, UA: NEGATIVE
Ketones, UA: NEGATIVE
Leukocytes, UA: NEGATIVE
NITRITE UA: NEGATIVE
PH UA: 6 (ref 5.0–7.5)
Specific Gravity, UA: 1.017 (ref 1.005–1.030)
UUROB: 0.2 mg/dL (ref 0.2–1.0)

## 2018-07-31 LAB — B12 AND FOLATE PANEL
Folate: 12.5 ng/mL (ref >3.0)
Vitamin B-12: 1120 pg/mL (ref 232–1245)

## 2018-07-31 LAB — CELIAC DISEASE PANEL
ENDOMYSIAL IGA: NEGATIVE
IGA/IMMUNOGLOBULIN A, SERUM: 331 mg/dL (ref 87–352)

## 2018-08-03 ENCOUNTER — Encounter: Payer: Self-pay | Admitting: Gastroenterology

## 2018-08-08 ENCOUNTER — Encounter: Payer: Self-pay | Admitting: Family Medicine

## 2018-08-08 NOTE — Telephone Encounter (Signed)
appt please

## 2018-08-12 ENCOUNTER — Encounter: Payer: Self-pay | Admitting: Family Medicine

## 2018-08-15 ENCOUNTER — Ambulatory Visit
Admission: RE | Admit: 2018-08-15 | Discharge: 2018-08-15 | Disposition: A | Discharge status: Home / Self Care | Payer: 59 | Source: Ambulatory Visit | Attending: Gastroenterology | Admitting: Gastroenterology

## 2018-08-15 ENCOUNTER — Encounter
Admission: RE | Disposition: A | Discharge status: Home / Self Care | Payer: Self-pay | Source: Ambulatory Visit | Attending: Gastroenterology

## 2018-08-15 ENCOUNTER — Ambulatory Visit: Payer: 59

## 2018-08-15 ENCOUNTER — Ambulatory Visit: Payer: 59 | Admitting: Certified Registered Nurse Anesthetist

## 2018-08-15 DIAGNOSIS — E11319 Type 2 diabetes mellitus with unspecified diabetic retinopathy without macular edema: Secondary | ICD-10-CM | POA: Insufficient documentation

## 2018-08-15 DIAGNOSIS — J45909 Unspecified asthma, uncomplicated: Secondary | ICD-10-CM | POA: Insufficient documentation

## 2018-08-15 DIAGNOSIS — E785 Hyperlipidemia, unspecified: Secondary | ICD-10-CM | POA: Diagnosis not present

## 2018-08-15 DIAGNOSIS — K2950 Unspecified chronic gastritis without bleeding: Secondary | ICD-10-CM | POA: Diagnosis not present

## 2018-08-15 DIAGNOSIS — Z79899 Other long term (current) drug therapy: Secondary | ICD-10-CM | POA: Insufficient documentation

## 2018-08-15 DIAGNOSIS — D509 Iron deficiency anemia, unspecified: Secondary | ICD-10-CM | POA: Diagnosis not present

## 2018-08-15 DIAGNOSIS — K2960 Other gastritis without bleeding: Secondary | ICD-10-CM | POA: Diagnosis not present

## 2018-08-15 DIAGNOSIS — K621 Rectal polyp: Secondary | ICD-10-CM | POA: Diagnosis not present

## 2018-08-15 DIAGNOSIS — I10 Essential (primary) hypertension: Secondary | ICD-10-CM | POA: Insufficient documentation

## 2018-08-15 DIAGNOSIS — Z7982 Long term (current) use of aspirin: Secondary | ICD-10-CM | POA: Insufficient documentation

## 2018-08-15 DIAGNOSIS — Z794 Long term (current) use of insulin: Secondary | ICD-10-CM | POA: Diagnosis not present

## 2018-08-15 DIAGNOSIS — K635 Polyp of colon: Secondary | ICD-10-CM | POA: Diagnosis not present

## 2018-08-15 DIAGNOSIS — D649 Anemia, unspecified: Secondary | ICD-10-CM | POA: Diagnosis not present

## 2018-08-15 DIAGNOSIS — K2970 Gastritis, unspecified, without bleeding: Secondary | ICD-10-CM | POA: Diagnosis not present

## 2018-08-15 DIAGNOSIS — D128 Benign neoplasm of rectum: Secondary | ICD-10-CM | POA: Diagnosis not present

## 2018-08-15 HISTORY — PX: ESOPHAGOGASTRODUODENOSCOPY (EGD) WITH PROPOFOL: SHX5813

## 2018-08-15 HISTORY — PX: COLONOSCOPY WITH PROPOFOL: SHX5780

## 2018-08-15 LAB — GLUCOSE, CAPILLARY: GLUCOSE-CAPILLARY: 109 mg/dL — AB (ref 70–99)

## 2018-08-15 SURGERY — COLONOSCOPY WITH PROPOFOL
Anesthesia: General

## 2018-08-15 MED ORDER — LIDOCAINE HCL (PF) 1 % IJ SOLN
2.0000 mL | Freq: Once | INTRAMUSCULAR | Status: AC
  Administered 2018-08-15: 0.3 mL via INTRADERMAL

## 2018-08-15 MED ORDER — LIDOCAINE HCL (CARDIAC) PF 100 MG/5ML IV SOSY
PREFILLED_SYRINGE | INTRAVENOUS | Status: DC | PRN
  Administered 2018-08-15: 50 mg via INTRAVENOUS

## 2018-08-15 MED ORDER — PROPOFOL 10 MG/ML IV BOLUS
INTRAVENOUS | Status: DC | PRN
  Administered 2018-08-15: 80 mg via INTRAVENOUS

## 2018-08-15 MED ORDER — PROPOFOL 500 MG/50ML IV EMUL
INTRAVENOUS | Status: AC
  Filled 2018-08-15: qty 50

## 2018-08-15 MED ORDER — PHENYLEPHRINE HCL 10 MG/ML IJ SOLN
INTRAMUSCULAR | Status: AC
  Filled 2018-08-15: qty 2

## 2018-08-15 MED ORDER — PROPOFOL 500 MG/50ML IV EMUL
INTRAVENOUS | Status: DC | PRN
  Administered 2018-08-15: 150 ug/kg/min via INTRAVENOUS

## 2018-08-15 MED ORDER — EPHEDRINE SULFATE 50 MG/ML IJ SOLN
INTRAMUSCULAR | Status: AC
  Filled 2018-08-15: qty 1

## 2018-08-15 MED ORDER — SODIUM CHLORIDE 0.9 % IV SOLN
INTRAVENOUS | Status: DC
  Administered 2018-08-15: 1000 mL via INTRAVENOUS

## 2018-08-15 MED ORDER — LIDOCAINE HCL (PF) 1 % IJ SOLN
INTRAMUSCULAR | Status: AC
  Administered 2018-08-15: 0.3 mL via INTRADERMAL
  Filled 2018-08-15: qty 2

## 2018-08-15 MED ORDER — LIDOCAINE HCL (PF) 2 % IJ SOLN
INTRAMUSCULAR | Status: AC
  Filled 2018-08-15: qty 10

## 2018-08-15 MED ORDER — GLYCOPYRROLATE 0.2 MG/ML IJ SOLN
INTRAMUSCULAR | Status: AC
  Filled 2018-08-15: qty 1

## 2018-08-15 NOTE — Anesthesia Post-op Follow-up Note (Signed)
Anesthesia QCDR form completed.        

## 2018-08-15 NOTE — Op Note (Signed)
[facility] Gastroenterology Patient Name: Janice Jennings Procedure Date: 08/15/2018 7:40 AM MRN: [id number] Account #: [id number] Date of Birth: 1970/11/23 Admit Type: Outpatient Age: 47 Room: [facility] ENDO ROOM 4 Gender: Female Note Status: Finalized Procedure:            Upper GI endoscopy Indications:          Iron deficiency anemia Providers:            Jonathon Bellows MD, MD Referring MD:         Arnetha Courser (Referring MD) Medicines:            Monitored Anesthesia Care Complications:        No immediate complications. Procedure:            Pre-Anesthesia Assessment:                       - Prior to the procedure, a History and Physical was                        performed, and patient medications, allergies and                        sensitivities were reviewed. The patient's tolerance of                        previous anesthesia was reviewed.                       - The risks and benefits of the procedure and the                        sedation options and risks were discussed with the                        patient. All questions were answered and informed                        consent was obtained.                       - ASA Grade Assessment: II - A patient with mild                        systemic disease.                       After obtaining informed consent, the endoscope was                        passed under direct vision. Throughout the procedure,                        the patient's blood pressure, pulse, and oxygen                        saturations were monitored continuously. The Endoscope                        was introduced through the mouth, and advanced to the  third part of duodenum. The upper GI endoscopy was                        accomplished with ease. The patient tolerated the                        procedure well. Findings:      The esophagus was normal.      The examined duodenum was normal.      Patchy  moderate inflammation characterized by congestion (edema) and       erythema was found in the gastric body and in the gastric antrum.       Biopsies were taken with a cold forceps for histology.      The cardia and gastric fundus were normal on retroflexion. Impression:           - Normal esophagus.                       - Normal examined duodenum.                       - Gastritis. Biopsied. Recommendation:       - Await pathology results.                       - Perform a colonoscopy today. Procedure Code(s):    --- Professional ---                       [phone], Esophagogastroduodenoscopy, flexible, transoral;                        with biopsy, single or multiple Diagnosis Code(s):    --- Professional ---                       K29.70, Gastritis, unspecified, without bleeding                       D50.9, Iron deficiency anemia, unspecified CPT copyright 2018 American Medical Association. All rights reserved. The codes documented in this report are preliminary and upon coder review may  be revised to meet current compliance requirements. Jonathon Bellows, MD Jonathon Bellows MD, MD 08/15/2018 8:25:28 AM This report has been signed electronically. Number of Addenda: 0 Note Initiated On: 08/15/2018 7:40 AM      [facility]

## 2018-08-15 NOTE — Anesthesia Procedure Notes (Signed)
Date/Time: 08/15/2018 8:16 AM Performed by: Johnna Acosta, CRNA Pre-anesthesia Checklist: Patient identified, Emergency Drugs available, Suction available, Patient being monitored and Timeout performed Patient Re-evaluated:Patient Re-evaluated prior to induction Oxygen Delivery Method: Nasal cannula Preoxygenation: Pre-oxygenation with 100% oxygen Induction Type: IV induction

## 2018-08-15 NOTE — Transfer of Care (Signed)
Immediate Anesthesia Transfer of Care Note  Patient: Janice Jennings  Procedure(s) Performed: COLONOSCOPY WITH PROPOFOL (N/A ) ESOPHAGOGASTRODUODENOSCOPY (EGD) WITH PROPOFOL (N/A )  Patient Location: PACU  Anesthesia Type:General  Level of Consciousness: drowsy  Airway & Oxygen Therapy: Patient Spontanous Breathing and Patient connected to nasal cannula oxygen  Post-op Assessment: Report given to RN and Post -op Vital signs reviewed and stable  Post vital signs: Reviewed and stable  Last Vitals:  Vitals Value Taken Time  BP 155/80 08/15/2018  8:45 AM  Temp 36.1 C 08/15/2018  8:45 AM  Pulse 84 08/15/2018  8:46 AM  Resp 20 08/15/2018  8:46 AM  SpO2 100 % 08/15/2018  8:46 AM  Vitals shown include unvalidated device data.  Last Pain:  Vitals:   08/15/18 0845  TempSrc: Tympanic  PainSc: 0-No pain         Complications: No apparent anesthesia complications

## 2018-08-15 NOTE — Op Note (Signed)
[facility] Gastroenterology Patient Name: Janice Jennings Procedure Date: 08/15/2018 7:39 AM MRN: [id number] Account #: [id number] Date of Birth: 12/20/1970 Admit Type: Outpatient Age: 47 Room: [facility] ENDO ROOM 4 Gender: Female Note Status: Finalized Procedure:            Colonoscopy Indications:          Iron deficiency anemia Providers:            Jonathon Bellows MD, MD Referring MD:         Arnetha Courser (Referring MD) Medicines:            Monitored Anesthesia Care Complications:        No immediate complications. Procedure:            Pre-Anesthesia Assessment:                       - Prior to the procedure, a History and Physical was                        performed, and patient medications, allergies and                        sensitivities were reviewed. The patient's tolerance of                        previous anesthesia was reviewed.                       - The risks and benefits of the procedure and the                        sedation options and risks were discussed with the                        patient. All questions were answered and informed                        consent was obtained.                       - ASA Grade Assessment: II - A patient with mild                        systemic disease.                       After obtaining informed consent, the colonoscope was                        passed under direct vision. Throughout the procedure,                        the patient's blood pressure, pulse, and oxygen                        saturations were monitored continuously. The                        Colonoscope was introduced through the anus and                        advanced  to the the cecum, identified by the                        appendiceal orifice, IC valve and transillumination.                        The colonoscopy was performed with ease. The patient                        tolerated the procedure well. The quality of the bowel                     preparation was good. Findings:      The perianal and digital rectal examinations were normal.      A 3 mm polyp was found in the rectum. The polyp was sessile. The polyp       was removed with a cold biopsy forceps. Resection and retrieval were       complete.      The exam was otherwise without abnormality on direct and retroflexion       views. Impression:           - One 3 mm polyp in the rectum, removed with a cold                        biopsy forceps. Resected and retrieved.                       - The examination was otherwise normal on direct and                        retroflexion views. Recommendation:       - Discharge patient to home (with escort).                       - Resume previous diet.                       - Continue present medications.                       - Await pathology results.                       - Repeat colonoscopy in 5-10 years for surveillance                        based on pathology results.                       - To visualize the small bowel, perform video capsule                        endoscopy in 2 weeks.                       - Return to my office in 4 weeks. Procedure Code(s):    --- Professional ---                       [phone], Colonoscopy, flexible; with biopsy, single or  multiple Diagnosis Code(s):    --- Professional ---                       K62.1, Rectal polyp                       D50.9, Iron deficiency anemia, unspecified CPT copyright 2018 American Medical Association. All rights reserved. The codes documented in this report are preliminary and upon coder review may  be revised to meet current compliance requirements. Jonathon Bellows, MD Jonathon Bellows MD, MD 08/15/2018 8:42:22 AM This report has been signed electronically. Number of Addenda: 0 Note Initiated On: 08/15/2018 7:39 AM Scope Withdrawal Time: 0 hours 11 minutes 8 seconds  Total Procedure Duration: 0 hours 13 minutes 50 seconds        [facility]

## 2018-08-15 NOTE — H&P (Signed)
Jonathon Bellows, MD [address] [place], [place], [place], [address] [address], [place], [place], [place], [address] Phone: [phone]  Fax: [phone]  Primary Care Physician:  Arnetha Courser, MD   Pre-Procedure History & Physical: HPI:  Dalanie Kisner is a 47 y.o. female is here for an endoscopy and colonoscopy    Past Medical History:  Diagnosis Date  . Asthma   . Diabetes mellitus without complication ([place])   . Diabetic retinopathy of both eyes associated with type 2 diabetes mellitus ([place]) 04/11/2018   Moderate to severe, non-proliferative DR; referred to retinal specialist; April 10, 2018  . Hyperlipidemia   . Hypertension   . Migraine    Excedrin Tension HA OTC    Past Surgical History:  Procedure Laterality Date  . BREAST REDUCTION SURGERY  2001  . CESAREAN SECTION    . REDUCTION MAMMAPLASTY Bilateral 2000  . TUBAL LIGATION      Prior to Admission medications   Medication Sig Start Date End Date Taking? Authorizing Provider  albuterol (PROVENTIL HFA;VENTOLIN HFA) 108 (90 Base) MCG/ACT inhaler Inhale 2 puffs into the lungs every 4 (four) hours as needed for wheezing or shortness of breath. 07/08/18  Yes Lada, Satira Anis, MD  amLODipine (NORVASC) 5 MG tablet TAKE 1 TABLET BY MOUTH EVERY DAY 07/28/18   Poulose, Bethel Born, NP  aspirin EC 81 MG tablet Take 81 mg by mouth daily.    [provider]  atorvastatin (LIPITOR) 40 MG tablet Take 1 tablet (40 mg total) by mouth at bedtime. 07/08/18   Arnetha Courser, MD  Cholecalciferol (VITAMIN D3) 50000 units CAPS TAKE ONE CAPSULE BY MOUTH ONE TIME PER WEEK 07/12/18   [provider]  Cinnamon 500 MG capsule Take 500 mg by mouth daily.    [provider]  Continuous Blood Gluc Receiver (FREESTYLE LIBRE READER) DEVI 1 each by Does not apply route 3 (three) times daily. Check sugars 3x a day; ICD 10 E11.319  LON 99 months 06/19/18   Arnetha Courser, MD  Continuous Blood Gluc Sensor (FREESTYLE  LIBRE 14 DAY SENSOR) MISC 1 each by Does not apply route 3 (three) times daily. Check sugars 3x a day; ICD 10 E11.319  LON 99 months 06/19/18   Arnetha Courser, MD  ferrous sulfate 325 (65 FE) MG tablet Take 325 mg by mouth daily with breakfast.    [provider]  gabapentin (NEURONTIN) 300 MG capsule Take 1 capsule (300 mg total) by mouth 3 (three) times daily. 06/19/18   Arnetha Courser, MD  GARCINIA CAMBOGIA-CHROMIUM PO Take 1 capsule by mouth daily.    [provider]  glucose blood (ONE TOUCH ULTRA TEST) test strip Use to check blood sugar two times a day. 07/12/16   Jearld Fenton, NP  Insulin Glargine (BASAGLAR KWIKPEN) 100 UNIT/ML SOPN Inject 0.5 mLs (50 Units total) into the skin daily at 10 pm. 06/19/18   Lada, Satira Anis, MD  Insulin Pen Needle 31G X 5 MM MISC Use one pen needle two times daily. 12/21/15   Jearld Fenton, NP  metFORMIN (GLUCOPHAGE) 1000 MG tablet Take 1 tablet (1,000 mg total) by mouth 2 (two) times daily with a meal. 06/19/18   Lada, Satira Anis, MD  metroNIDAZOLE (METROGEL) 0.75 % gel Apply 1 application topically 2 (two) times daily. 07/08/18   Arnetha Courser, MD  Multiple Vitamin (MULTIVITAMIN) tablet Take 1 tablet by mouth daily.    [provider]  [facility] DELICA LANCETS 38G MISC 1 each by Does not apply route daily. 06/26/16   Jearld Fenton, NP  Tiotropium Bromide-Olodaterol (STIOLTO RESPIMAT) 2.5-2.5 MCG/ACT AERS Inhale 1 puff into the lungs 2 (two) times daily. 07/08/18   Arnetha Courser, MD  valsartan (DIOVAN) 160 MG tablet TAKE 1 TABLET BY MOUTH EVERY DAY 07/28/18   Fredderick Severance, NP    Allergies as of 07/30/2018  . (No Known Allergies)    Family History  Problem Relation Age of Onset  . Diabetes Father   . Hyperlipidemia Father   . Hypertension Father   . Cancer Maternal Aunt 45       leukemia - died  . Diabetes Maternal Grandmother   . Cancer Maternal Grandmother        breast cancer  . Stroke Maternal Grandmother   .  Breast cancer Maternal Grandmother   . Diabetes Daughter        Pre-diabetes  . Diabetes Maternal Grandfather   . Hyperlipidemia Maternal Grandfather   . Hypertension Maternal Grandfather   . Cancer Maternal Aunt 50       breast cancer - died  . Ovarian cancer Neg Hx   . Colon cancer Neg Hx     Social History   Socioeconomic History  . Marital status: Single    Spouse name: Not on file  . Number of children: 2  . Years of education: 37  . Highest education level: Not on file  Occupational History  . Occupation: Social worker at [employer]  . Financial resource strain: Not on file  . Food insecurity:    Worry: Not on file    Inability: Not on file  . Transportation needs:    Medical: Not on file    Non-medical: Not on file  Tobacco Use  . Smoking status: Never Smoker  . Smokeless tobacco: Never Used  Substance and Sexual Activity  . Alcohol use: Yes    Alcohol/week: 0.0 standard drinks    Comment: Occasional - maybe 3 times a month  . Drug use: No  . Sexual activity: Yes    Birth control/protection: None  Lifestyle  . Physical activity:    Days per week: Not on file    Minutes per session: Not on file  . Stress: Not on file  Relationships  . Social connections:    Talks on phone: Not on file    Gets together: Not on file    Attends religious service: Not on file    Active member of club or organization: Not on file    Attends meetings of clubs or organizations: Not on file    Relationship status: Not on file  . Intimate partner violence:    Fear of current or ex partner: Not on file    Emotionally abused: Not on file    Physically abused: Not on file    Forced sexual activity: Not on file  Other Topics Concern  . Not on file  Social History Narrative   Mariko grew up in [place], [place] and also in [place]. She attending the [employer] and obtained her Bachelors in Psychology. She then  attended [employer] and obtained her Masters in Psychology. 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 She lives at home with her two children, Darius age 7 and [place] age 28. They have a dog named Coco who she  describes as having separation anxiety. Elif enjoys activities with the children. They enjoy outdoor activities, watching movies.      Review of Systems: See HPI, otherwise negative ROS  Physical Exam: There were no vitals taken for this visit. General:   Alert,  pleasant and cooperative in NAD Head:  Normocephalic and atraumatic. Neck:  Supple; no masses or thyromegaly. Lungs:  Clear throughout to auscultation, normal respiratory effort.    Heart:  +S1, +S2, Regular rate and rhythm, No edema. Abdomen:  Soft, nontender and nondistended. Normal bowel sounds, without guarding, and without rebound.   Neurologic:  Alert and  oriented x4;  grossly normal neurologically.  Impression/Plan: Mike Berntsen is here for an endoscopy and colonoscopy  to be performed for  evaluation of iron deficiency anemia    Risks, benefits, limitations, and alternatives regarding endoscopy have been reviewed with the patient.  Questions have been answered.  All parties agreeable.   Jonathon Bellows, MD  08/15/2018, 8:14 AM

## 2018-08-15 NOTE — Anesthesia Preprocedure Evaluation (Addendum)
Anesthesia Evaluation  Patient identified by MRN, date of birth, ID band Patient awake    Reviewed: Allergy & Precautions, H&P , NPO status , Patient's Chart, lab work & pertinent test results  Airway Mallampati: I  TM Distance: >3 FB Neck ROM: full    Dental  (+) Teeth Intact   Pulmonary asthma ,    breath sounds clear to auscultation       Cardiovascular hypertension, negative cardio ROS   Rhythm:regular Rate:Normal     Neuro/Psych  Headaches,  Neuromuscular disease (diabetic peripheral neuropathy) negative psych ROS   GI/Hepatic negative GI ROS, Neg liver ROS,   Endo/Other  negative endocrine ROSdiabetes  Renal/GU negative Renal ROS  negative genitourinary   Musculoskeletal   Abdominal   Peds  Hematology negative hematology ROS (+) Blood dyscrasia, anemia ,   Anesthesia Other Findings Past Medical History: No date: Asthma No date: Diabetes mellitus without complication ([place]) 6/57/8469: Diabetic retinopathy of both eyes associated with type 2  diabetes mellitus ([facility])     Comment:  Moderate to severe, non-proliferative DR; referred to               retinal specialist; April 10, 2018 No date: Hyperlipidemia No date: Hypertension No date: Migraine     Comment:  Excedrin Tension HA OTC  Past Surgical History: 2001: BREAST REDUCTION SURGERY No date: CESAREAN SECTION 2000: REDUCTION MAMMAPLASTY; Bilateral No date: TUBAL LIGATION     Reproductive/Obstetrics negative OB ROS                            Anesthesia Physical Anesthesia Plan  ASA: III  Anesthesia Plan: General   Post-op Pain Management:    Induction:   PONV Risk Score and Plan: Propofol infusion and TIVA  Airway Management Planned:   Additional Equipment:   Intra-op Plan:   Post-operative Plan:   Informed Consent: I have reviewed the patients History and Physical, chart, labs and discussed the procedure  including the risks, benefits and alternatives for the proposed anesthesia with the patient or authorized representative who has indicated his/her understanding and acceptance.   Dental Advisory Given  Plan Discussed with: Anesthesiologist, CRNA and Surgeon  Anesthesia Plan Comments:         Anesthesia Quick Evaluation

## 2018-08-17 NOTE — Anesthesia Postprocedure Evaluation (Signed)
Anesthesia Post Note  Patient: Janice Jennings  Procedure(s) Performed: COLONOSCOPY WITH PROPOFOL (N/A ) ESOPHAGOGASTRODUODENOSCOPY (EGD) WITH PROPOFOL (N/A )  Patient location during evaluation: PACU Anesthesia Type: General Level of consciousness: awake and alert Pain management: pain level controlled Vital Signs Assessment: post-procedure vital signs reviewed and stable Respiratory status: spontaneous breathing, nonlabored ventilation and respiratory function stable Cardiovascular status: blood pressure returned to baseline and stable Postop Assessment: no apparent nausea or vomiting Anesthetic complications: no     Last Vitals:  Vitals:   08/15/18 0905 08/15/18 0915  BP:  (!) 189/79  Pulse: 87 83  Resp: 15 20  Temp:    SpO2: 100% 100%    Last Pain:  Vitals:   08/16/18 1015  TempSrc:   PainSc: 0-No pain                 Durenda Hurt

## 2018-08-18 ENCOUNTER — Telehealth: Payer: Self-pay

## 2018-08-18 ENCOUNTER — Encounter: Payer: Self-pay | Admitting: Gastroenterology

## 2018-08-18 ENCOUNTER — Other Ambulatory Visit: Payer: Self-pay

## 2018-08-18 DIAGNOSIS — R809 Proteinuria, unspecified: Secondary | ICD-10-CM | POA: Diagnosis not present

## 2018-08-18 DIAGNOSIS — D649 Anemia, unspecified: Secondary | ICD-10-CM

## 2018-08-18 DIAGNOSIS — I129 Hypertensive chronic kidney disease with stage 1 through stage 4 chronic kidney disease, or unspecified chronic kidney disease: Secondary | ICD-10-CM | POA: Diagnosis not present

## 2018-08-18 DIAGNOSIS — D509 Iron deficiency anemia, unspecified: Secondary | ICD-10-CM

## 2018-08-18 DIAGNOSIS — E1122 Type 2 diabetes mellitus with diabetic chronic kidney disease: Secondary | ICD-10-CM | POA: Diagnosis not present

## 2018-08-18 DIAGNOSIS — N183 Chronic kidney disease, stage 3 (moderate): Secondary | ICD-10-CM | POA: Diagnosis not present

## 2018-08-18 LAB — SURGICAL PATHOLOGY

## 2018-08-18 NOTE — Telephone Encounter (Signed)
Spoke with pt and informed her of Dr. Georgeann Oppenheim instructions to schedule a Capsule study to evaluate small bowel. Pt is aware of scheduled date and prior prep instructions. Pt is also aware that she will receive printed instructions via mail.

## 2018-08-18 NOTE — Telephone Encounter (Signed)
-----   Message from Jonathon Bellows, MD sent at 08/15/2018  8:49 AM EDT ----- Regarding: please arrange appointment  Janice Jennings  Please arrange capsule study of the small bowel    Regards    Dr Jonathon Bellows  Gastroenterology/Hepatology Pager: [phone]

## 2018-08-20 ENCOUNTER — Telehealth: Payer: Self-pay | Admitting: Gastroenterology

## 2018-08-20 NOTE — Telephone Encounter (Signed)
Pt is calling regarding capsule study

## 2018-08-20 NOTE — Telephone Encounter (Signed)
Spoke with pt regarding capsule study. Pt is requesting to reschedule exam to 09-08-18. I have contacted [facility] to have appointment date changed.

## 2018-08-21 ENCOUNTER — Other Ambulatory Visit: Payer: Self-pay | Admitting: Nephrology

## 2018-08-21 DIAGNOSIS — R809 Proteinuria, unspecified: Secondary | ICD-10-CM

## 2018-08-21 DIAGNOSIS — N183 Chronic kidney disease, stage 3 unspecified: Secondary | ICD-10-CM

## 2018-08-23 ENCOUNTER — Other Ambulatory Visit: Payer: Self-pay | Admitting: Nurse Practitioner

## 2018-08-23 DIAGNOSIS — N289 Disorder of kidney and ureter, unspecified: Secondary | ICD-10-CM

## 2018-08-23 DIAGNOSIS — I10 Essential (primary) hypertension: Secondary | ICD-10-CM

## 2018-08-24 ENCOUNTER — Encounter: Payer: Self-pay | Admitting: Gastroenterology

## 2018-08-31 ENCOUNTER — Other Ambulatory Visit: Payer: Self-pay | Admitting: Family Medicine

## 2018-08-31 NOTE — Telephone Encounter (Signed)
Please remind patient of the labs that were due in October She should not run of meds before the labs come back Please have her come in this week

## 2018-09-01 NOTE — Telephone Encounter (Signed)
Pt notified. Stated she is not out yet and will come in for labs this week.

## 2018-09-03 ENCOUNTER — Other Ambulatory Visit: Payer: Self-pay

## 2018-09-03 DIAGNOSIS — R809 Proteinuria, unspecified: Secondary | ICD-10-CM

## 2018-09-03 DIAGNOSIS — E7800 Pure hypercholesterolemia, unspecified: Secondary | ICD-10-CM

## 2018-09-03 DIAGNOSIS — N183 Chronic kidney disease, stage 3 unspecified: Secondary | ICD-10-CM

## 2018-09-04 ENCOUNTER — Other Ambulatory Visit: Payer: Self-pay | Admitting: Family Medicine

## 2018-09-04 DIAGNOSIS — E7800 Pure hypercholesterolemia, unspecified: Secondary | ICD-10-CM

## 2018-09-04 LAB — LIPID PANEL
CHOLESTEROL: 190 mg/dL (ref <200)
HDL: 53 mg/dL (ref >50)
LDL Cholesterol (Calc): 115 mg/dL (calc) — ABNORMAL HIGH
NON-HDL CHOLESTEROL (CALC): 137 mg/dL — AB (ref <130)
TRIGLYCERIDES: 109 mg/dL (ref <150)
Total CHOL/HDL Ratio: 3.6 (calc) (ref <5.0)

## 2018-09-04 MED ORDER — ATORVASTATIN CALCIUM 80 MG PO TABS: 80.0000 mg | ORAL_TABLET | Freq: Every day | ORAL | 0 refills | Status: DC

## 2018-09-04 NOTE — Progress Notes (Signed)
Increase statin; recheck lipids in 6 weeks

## 2018-09-08 ENCOUNTER — Encounter
Admission: RE | Disposition: A | Discharge status: Home / Self Care | Payer: Self-pay | Source: Ambulatory Visit | Attending: Gastroenterology

## 2018-09-08 ENCOUNTER — Encounter: Payer: Self-pay | Admitting: Anesthesiology

## 2018-09-08 ENCOUNTER — Ambulatory Visit
Admission: RE | Admit: 2018-09-08 | Discharge: 2018-09-08 | Disposition: A | Discharge status: Home / Self Care | Payer: 59 | Source: Ambulatory Visit | Attending: Gastroenterology | Admitting: Gastroenterology

## 2018-09-08 DIAGNOSIS — D509 Iron deficiency anemia, unspecified: Secondary | ICD-10-CM | POA: Insufficient documentation

## 2018-09-08 HISTORY — PX: GIVENS CAPSULE STUDY: SHX5432

## 2018-09-08 SURGERY — IMAGING PROCEDURE, GI TRACT, INTRALUMINAL, VIA CAPSULE

## 2018-09-09 ENCOUNTER — Encounter: Payer: Self-pay | Admitting: Gastroenterology

## 2018-09-22 ENCOUNTER — Encounter: Payer: Self-pay | Admitting: Family Medicine

## 2018-09-22 ENCOUNTER — Ambulatory Visit (INDEPENDENT_AMBULATORY_CARE_PROVIDER_SITE_OTHER): Payer: 59 | Admitting: Family Medicine

## 2018-09-22 VITALS — BP 130/72 | HR 96 | Temp 98.0°F | Ht 63.0 in | Wt 200.5 lb

## 2018-09-22 DIAGNOSIS — M25561 Pain in right knee: Secondary | ICD-10-CM

## 2018-09-22 DIAGNOSIS — E1142 Type 2 diabetes mellitus with diabetic polyneuropathy: Secondary | ICD-10-CM | POA: Diagnosis not present

## 2018-09-22 DIAGNOSIS — I10 Essential (primary) hypertension: Secondary | ICD-10-CM

## 2018-09-22 DIAGNOSIS — R040 Epistaxis: Secondary | ICD-10-CM

## 2018-09-22 DIAGNOSIS — E7800 Pure hypercholesterolemia, unspecified: Secondary | ICD-10-CM

## 2018-09-22 DIAGNOSIS — E113393 Type 2 diabetes mellitus with moderate nonproliferative diabetic retinopathy without macular edema, bilateral: Secondary | ICD-10-CM | POA: Diagnosis not present

## 2018-09-22 MED ORDER — EZETIMIBE 10 MG PO TABS: 10.0000 mg | ORAL_TABLET | Freq: Every day | ORAL | 3 refills | Status: DC

## 2018-09-22 NOTE — Patient Instructions (Addendum)
Try to follow the DASH guidelines (DASH stands for Dietary Approaches to Stop Hypertension). Try to limit the sodium in your diet to no more than 1,500mg  of sodium per day. Certainly try to not exceed 2,000 mg per day at the very most. Do not add salt when cooking or at the table.  Check the sodium amount on labels when shopping, and choose items lower in sodium when given a choice. Avoid or limit foods that already contain a lot of sodium. Eat a diet rich in fruits and vegetables and whole grains, and try to lose weight if overweight or obese  Check out the information at familydoctor.org entitled "Nutrition for Weight Loss: What You Need to Know about Fad Diets" Try to lose between 1 pounds per week by taking in fewer calories and burning off more calories You can succeed by limiting portions, limiting foods dense in calories and fat, becoming more active, and drinking 8 glasses of water a day (64 ounces) Don't skip meals, especially breakfast, as skipping meals may alter your metabolism Do not use over-the-counter weight loss pills or gimmicks that claim rapid weight loss A healthy BMI (or body mass index) is between 18.5 and 24.9 You can calculate your ideal BMI at the [place] website ClubMonetize.fr  Try to lose 1 pound per week and get down eventually to 171 pounds to get out of the obesity category   Obesity, Adult Obesity is the condition of having too much total body fat. Being overweight or obese means that your weight is greater than what is considered healthy for your body size. Obesity is determined by a measurement called BMI. BMI is an estimate of body fat and is calculated from height and weight. For adults, a BMI of 30 or higher is considered obese. Obesity can eventually lead to other health concerns and major illnesses, including:  Stroke.  Coronary artery disease (CAD).  Type 2 diabetes.  Some types of cancer, including  cancers of the colon, breast, uterus, and gallbladder.  Osteoarthritis.  High blood pressure (hypertension).  High cholesterol.  Sleep apnea.  Gallbladder stones.  Infertility problems.  What are the causes? The main cause of obesity is taking in (consuming) more calories than your body uses for energy. Other factors that contribute to this condition may include:  Being born with genes that make you more likely to become obese.  Having a medical condition that causes obesity. These conditions include: ? Hypothyroidism. ? Polycystic ovarian syndrome (PCOS). ? Binge-eating disorder. ? Cushing syndrome.  Taking certain medicines, such as steroids, antidepressants, and seizure medicines.  Not being physically active (sedentary lifestyle).  Living where there are limited places to exercise safely or buy healthy foods.  Not getting enough sleep.  What increases the risk? The following factors may increase your risk of this condition:  Having a family history of obesity.  Being a woman of African-American descent.  Being a man of Hispanic descent.  What are the signs or symptoms? Having excessive body fat is the main symptom of this condition. How is this diagnosed? This condition may be diagnosed based on:  Your symptoms.  Your medical history.  A physical exam. Your health care provider may measure: ? Your BMI. If you are an adult with a BMI between 25 and less than 30, you are considered overweight. If you are an adult with a BMI of 30 or higher, you are considered obese. ? The distances around your hips and your waist (circumferences). These may be  compared to each other to help diagnose your condition. ? Your skinfold thickness. Your health care provider may gently pinch a fold of your skin and measure it.  How is this treated? Treatment for this condition often includes changing your lifestyle. Treatment may include some or all of the following:  Dietary  changes. Work with your health care provider and a dietitian to set a weight-loss goal that is healthy and reasonable for you. Dietary changes may include eating: ? Smaller portions. A portion size is the amount of a particular food that is healthy for you to eat at one time. This varies from person to person. ? Low-calorie or low-fat options. ? More whole grains, fruits, and vegetables.  Regular physical activity. This may include aerobic activity (cardio) and strength training.  Medicine to help you lose weight. Your health care provider may prescribe medicine if you are unable to lose 1 pound a week after 6 weeks of eating more healthily and doing more physical activity.  Surgery. Surgical options may include gastric banding and gastric bypass. Surgery may be done if: ? Other treatments have not helped to improve your condition. ? You have a BMI of 40 or higher. ? You have life-threatening health problems related to obesity.  Follow these instructions at home:  Eating and drinking   Follow recommendations from your health care provider about what you eat and drink. Your health care provider may advise you to: ? Limit fast foods, sweets, and processed snack foods. ? Choose low-fat options, such as low-fat milk instead of whole milk. ? Eat 5 or more servings of fruits or vegetables every day. ? Eat at home more often. This gives you more control over what you eat. ? Choose healthy foods when you eat out. ? Learn what a healthy portion size is. ? Keep low-fat snacks on hand. ? Avoid sugary drinks, such as soda, fruit juice, iced tea sweetened with sugar, and flavored milk. ? Eat a healthy breakfast.  Drink enough water to keep your urine clear or pale yellow.  Do not go without eating for long periods of time (do not fast) or follow a fad diet. Fasting and fad diets can be unhealthy and even dangerous. Physical Activity  Exercise regularly, as told by your health care provider. Ask  your health care provider what types of exercise are safe for you and how often you should exercise.  Warm up and stretch before being active.  Cool down and stretch after being active.  Rest between periods of activity. Lifestyle  Limit the time that you spend in front of your TV, computer, or video game system.  Find ways to reward yourself that do not involve food.  Limit alcohol intake to no more than 1 drink a day for nonpregnant women and 2 drinks a day for men. One drink equals 12 oz of beer, 5 oz of wine, or 1 oz of hard liquor. General instructions  Keep a weight loss journal to keep track of the food you eat and how much you exercise you get.  Take over-the-counter and prescription medicines only as told by your health care provider.  Take vitamins and supplements only as told by your health care provider.  Consider joining a support group. Your health care provider may be able to recommend a support group.  Keep all follow-up visits as told by your health care provider. This is important. Contact a health care provider if:  You are unable to meet your weight  loss goal after 6 weeks of dietary and lifestyle changes. This information is not intended to replace advice given to you by your health care provider. Make sure you discuss any questions you have with your health care provider. Document Released: 11/22/2004 Document Revised: 03/19/2016 Document Reviewed: 08/03/2015 Elsevier Interactive Patient Education  2018 [place].  Preventing Unhealthy [employer], Adult Staying at a healthy weight is important. When fat builds up in your body, you may become overweight or obese. These conditions put you at greater risk for developing certain health problems, such as heart disease, diabetes, sleeping problems, joint problems, and some cancers. Unhealthy weight gain is often the result of making unhealthy choices in what you eat. It is also a result of not getting enough  exercise. You can make changes to your lifestyle to prevent obesity and stay as healthy as possible. What nutrition changes can be made? To maintain a healthy weight and prevent obesity:  Eat only as much as your body needs. To do this: ? Pay attention to signs that you are hungry or full. Stop eating as soon as you feel full. ? If you feel hungry, try drinking water first. Drink enough water so your urine is clear or pale yellow. ? Eat smaller portions. ? Look at serving sizes on food labels. Most foods contain more than one serving per container. ? Eat the recommended amount of calories for your gender and activity level. While most active people should eat around 2,000 calories per day, if you are trying to lose weight or are not very active, you main need to eat less calories. Talk to your health care provider or dietitian about how many calories you should eat each day.  Choose healthy foods, such as: ? Fruits and vegetables. Try to fill at least half of your plate at each meal with fruits and vegetables. ? Whole grains, such as whole wheat bread, brown rice, and quinoa. ? Lean meats, such as chicken or fish. ? Other healthy proteins, such as beans, eggs, or tofu. ? Healthy fats, such as nuts, seeds, fatty fish, and olive oil. ? Low-fat or fat-free dairy.  Check food labels and avoid food and drinks that: ? Are high in calories. ? Have added sugar. ? Are high in sodium. ? Have saturated fats or trans fats.  Limit how much you eat of the following foods: ? Prepackaged meals. ? Fast food. ? Fried foods. ? Processed meat, such as bacon, sausage, and deli meats. ? Fatty cuts of red meat and poultry with skin.  Cook foods in healthier ways, such as by baking, broiling, or grilling.  When grocery shopping, try to shop around the outside of the store. This helps you buy mostly fresh foods and avoid canned and prepackaged foods.  What lifestyle changes can be made?  Exercise at  least 30 minutes 5 or more days each week. Exercising includes brisk walking, yard work, biking, running, swimming, and team sports like basketball and soccer. Ask your health care provider which exercises are safe for you.  Do not use any products that contain nicotine or tobacco, such as cigarettes and e-cigarettes. If you need help quitting, ask your health care provider.  Limit alcohol intake to no more than 1 drink a day for nonpregnant women and 2 drinks a day for men. One drink equals 12 oz of beer, 5 oz of wine, or 1 oz of hard liquor.  Try to get 7-9 hours of sleep each night. What other  changes can be made?  Keep a food and activity journal to keep track of: ? What you ate and how many calories you had. Remember to count sauces, dressings, and side dishes. ? Whether you were active, and what exercises you did. ? Your calorie, weight, and activity goals.  Check your weight regularly. Track any changes. If you notice you have gained weight, make changes to your diet or activity routine.  Avoid taking weight-loss medicines or supplements. Talk to your health care provider before starting any new medicine or supplement.  Talk to your health care provider before trying any new diet or exercise plan. Why are these changes important? Eating healthy, staying active, and having healthy habits not only help prevent obesity, they also:  Help you to manage stress and emotions.  Help you to connect with friends and family.  Improve your self-esteem.  Improve your sleep.  Prevent long-term health problems.  What can happen if changes are not made? Being obese or overweight can cause you to develop joint or bone problems, which can make it hard for you to stay active or do activities you enjoy. Being obese or overweight also puts stress on your heart and lungs and can lead to health problems like diabetes, heart disease, and some cancers. Where to find more information: Talk with your  health care provider or a dietitian about healthy eating and healthy lifestyle choices. You may also find other information through these resources:  U.S. Department of Agriculture MyPlate: FormerBoss.no  American Heart Association: www.heart.org  Centers for Disease Control and Prevention: http://www.wolf.info/  Summary  Staying at a healthy weight is important. It helps prevent certain diseases and health problems, such as heart disease, diabetes, joint problems, sleep disorders, and some cancers.  Being obese or overweight can cause you to develop joint or bone problems, which can make it hard for you to stay active or do activities you enjoy.  You can prevent unhealthy weight gain by eating a healthy diet, exercising regularly, not smoking, limiting alcohol, and getting enough sleep.  Talk with your health care provider or a dietitian for guidance about healthy eating and healthy lifestyle choices. This information is not intended to replace advice given to you by your health care provider. Make sure you discuss any questions you have with your health care provider. Document Released: 10/16/2016 Document Revised: 11/21/2016 Document Reviewed: 11/21/2016 Elsevier Interactive Patient Education  2018 [place] walking and build up gradually and slowly Please do follow-up with Dr. Vicente Males to get those results about the capsule study Add zetia (ezemitibe) to your regimen; continue the atorvastatin  Contact your insurance and find out if there is a less expensive insulin for you, and talk to the pharmacist to see if they have a recommendation about the stinging Do check your sugars at least once a day to keep up with your numbers

## 2018-09-22 NOTE — Assessment & Plan Note (Signed)
Uncontrolled at check-in; weight loss and DASH guidelines encouraged

## 2018-09-22 NOTE — Progress Notes (Addendum)
BP 130/72   Pulse 96   Temp 98 F (36.7 C)   Ht 5\' 3"  (1.6 m)   Wt 200 lb 8 oz (90.9 kg)   SpO2 98%   BMI 35.52 kg/m    Subjective:    Patient ID: Janice Jennings, female    DOB: [birth year], 47 y.o.   MRN: [id number]  HPI: Janice Jennings is a 47 y.o. female  Chief Complaint  Patient presents with  . Follow-up    HPI Patient is here for f/u High blood pressure upon check-in, but better with rest; she was running when she got in Obesity; dealing with daughter; I asked what she thought would be a healthier weight; she thinks  Type 2 diabetes with urine microalb:Cr at 1200; seeing nephrologist; doing to have imaving of the kidneys; avoid NSAIDs; has not checked FSBS in about two weeks; no dry mouth or blurred vision  Lab Results  Component Value Date   HGBA1C 6.6 (H) 06/19/2018   Proteinuria; referred to nephrologist; knows to avoid NSAIDs Anemia; she has seen Dr. Vicente Males and had a capsule study earlier this month; recent colonoscopy in October; she has not heard back about the capsule study High cholesterol; note last labs; LDL dropped from 140 to 115; total dropped from 214 to 190 with 80 of atorvastatin; not eating much fatty meats Sharp pains along her right hip when the rains were bad, arthritis; better now She has blood when she blows her nose; happens this time of year; not bleeding so much that she cannot stop it Tender knot along the medial right knee; 2 weeks; no injury  Depression screen [facility] 2/9 09/22/2018 06/19/2018 06/25/2016 10/20/2015 07/01/2015  Decreased Interest 0 0 0 0 0  Down, Depressed, Hopeless 0 0 0 0 0  PHQ - 2 Score 0 0 0 0 0  Altered sleeping 0 - - - -  Tired, decreased energy 0 - - - -  Change in appetite 0 - - - -  Feeling bad or failure about yourself  0 - - - -  Trouble concentrating 0 - - - -  Moving slowly or fidgety/restless 0 - - - -  Suicidal thoughts 0 - - - -  PHQ-9 Score 0 - - - -  Difficult doing work/chores Not difficult at all - - - -     Fall Risk  09/22/2018 06/19/2018 06/25/2016 10/20/2015 07/01/2015  Falls in the past year? 0 No No No No    Relevant past medical, surgical, family and social history reviewed Past Medical History:  Diagnosis Date  . Asthma   . Diabetes mellitus without complication ([place])   . Diabetic retinopathy of both eyes associated with type 2 diabetes mellitus ([place]) 04/11/2018   Moderate to severe, non-proliferative DR; referred to retinal specialist; April 10, 2018  . Hyperlipidemia   . Hypertension   . Migraine    Excedrin Tension HA OTC   Past Surgical History:  Procedure Laterality Date  . BREAST REDUCTION SURGERY  2001  . CESAREAN SECTION    . COLONOSCOPY WITH PROPOFOL N/A 08/15/2018   Procedure: COLONOSCOPY WITH PROPOFOL;  Surgeon: Jonathon Bellows, MD;  Location: [facility] ENDOSCOPY;  Service: Gastroenterology;  Laterality: N/A;  . ESOPHAGOGASTRODUODENOSCOPY (EGD) WITH PROPOFOL N/A 08/15/2018   Procedure: ESOPHAGOGASTRODUODENOSCOPY (EGD) WITH PROPOFOL;  Surgeon: Jonathon Bellows, MD;  Location: [facility] ENDOSCOPY;  Service: Gastroenterology;  Laterality: N/A;  . GIVENS CAPSULE STUDY N/A 09/08/2018   Procedure: GIVENS CAPSULE STUDY;  Surgeon: Jonathon Bellows, MD;  Location: [facility] ENDOSCOPY;  Service: Gastroenterology;  Laterality: N/A;  . REDUCTION MAMMAPLASTY Bilateral 2000  . TUBAL LIGATION     Family History  Problem Relation Age of Onset  . Diabetes Father   . Hyperlipidemia Father   . Hypertension Father   . Cancer Maternal Aunt 39       leukemia - died  . Diabetes Maternal Grandmother   . Cancer Maternal Grandmother        breast cancer  . Stroke Maternal Grandmother   . Breast cancer Maternal Grandmother   . Diabetes Daughter        Pre-diabetes  . Diabetes Maternal Grandfather   . Hyperlipidemia Maternal Grandfather   . Hypertension Maternal Grandfather   . Cancer Maternal Aunt 50       breast cancer - died  . Ovarian cancer Neg Hx   . Colon cancer Neg Hx    Social History   Tobacco Use   . Smoking status: Never Smoker  . Smokeless tobacco: Never Used  Substance Use Topics  . Alcohol use: Yes    Alcohol/week: 0.0 standard drinks    Comment: Occasional - maybe 3 times a month  . Drug use: No     Office Visit from 09/22/2018 in [facility]  AUDIT-C Score  1      Interim medical history since last visit reviewed. Allergies and medications reviewed  Review of Systems  Constitutional: Negative for unexpected weight change.  Eyes: Negative for visual disturbance.  Endocrine: Negative for polydipsia and polyuria.   Per HPI unless specifically indicated above     Objective:    BP 130/72   Pulse 96   Temp 98 F (36.7 C)   Ht 5\' 3"  (1.6 m)   Wt 200 lb 8 oz (90.9 kg)   SpO2 98%   BMI 35.52 kg/m   Wt Readings from Last 3 Encounters:  09/22/18 200 lb 8 oz (90.9 kg)  07/30/18 201 lb 3.2 oz (91.3 kg)  06/19/18 199 lb (90.3 kg)    Physical Exam  Constitutional: She appears well-developed and well-nourished. No distress.  HENT:  Head: Normocephalic and atraumatic.  Eyes: EOM are normal. No scleral icterus.  Neck: No thyromegaly present.  Cardiovascular: Normal rate, regular rhythm and normal heart sounds.  No murmur heard. Pulmonary/Chest: Effort normal and breath sounds normal. No respiratory distress. She has no wheezes.  Abdominal: Soft. Bowel sounds are normal. She exhibits no distension.  Musculoskeletal: She exhibits no edema.       Right knee: Tenderness (tender nodule along medial aspect) found.  Neurological: She is alert.  Skin: Skin is warm and dry. She is not diaphoretic. No pallor.  Psychiatric: She has a normal mood and affect. Her behavior is normal. Judgment and thought content normal.   Diabetic Foot Form - Detailed   Diabetic Foot Exam - detailed Diabetic Foot exam was performed with the following findings:  Yes 09/22/2018  8:15 AM  Visual Foot Exam completed.:  Yes  Pulse Foot Exam completed.:  Yes  Right Dorsalis  Pedis:  Present Left Dorsalis Pedis:  Present  Sensory Foot Exam Completed.:  Yes Semmes-Weinstein Monofilament Test R Site 1-Great Toe:  Pos L Site 1-Great Toe:  Pos        Results for orders placed or performed in visit on 09/03/18  Lipid panel  Result Value Ref Range   Cholesterol 190 <200 mg/dL   HDL 53 >50 mg/dL   Triglycerides 109 <150 mg/dL  LDL Cholesterol (Calc) 115 (H) mg/dL (calc)   Total CHOL/HDL Ratio 3.6 <5.0 (calc)   Non-HDL Cholesterol (Calc) 137 (H) <130 mg/dL (calc)      Assessment & Plan:   Problem List Items Addressed This Visit      Cardiovascular and Mediastinum   Essential hypertension (Chronic)    Uncontrolled at check-in; weight loss and DASH guidelines encouraged      Relevant Medications   ezetimibe (ZETIA) 10 MG tablet     Endocrine   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)    Foot exam by MD; encouraged weight loss      Diabetic retinopathy of both eyes associated with type 2 diabetes mellitus ([place]) (Chronic)    Encouraged regular f/u with eye professional        Other   Morbid obesity ([place])    BMI >35 with diabetes, high chol, HTN; urged weight loss, 1 pound per week x 26 weeks to reach 174 pounds in 6 months      High cholesterol (Chronic)    Better control with 80 of atorvastatin; add zetia; healthier diet, weight lsos encouraged      Relevant Medications   ezetimibe (ZETIA) 10 MG tablet    Other Visit Diagnoses    Epistaxis       Relevant Orders   Ambulatory referral to ENT   Acute pain of right knee       Relevant Orders   Ambulatory referral to Orthopedic Surgery       Follow up plan: Return in about 13 weeks (around 12/22/2018) for follow-up visit with Dr. Sanda Klein (or just AFTER).  An after-visit summary was printed and given to the patient at [place].  Please see the patient instructions which may contain other information and recommendations beyond what is mentioned above in the  assessment and plan.  Meds ordered this encounter  Medications  . ezetimibe (ZETIA) 10 MG tablet    Sig: Take 1 tablet (10 mg total) by mouth daily.    Dispense:  90 tablet    Refill:  3    Orders Placed This Encounter  Procedures  . Ambulatory referral to ENT  . Ambulatory referral to Orthopedic Surgery

## 2018-09-22 NOTE — Assessment & Plan Note (Signed)
BMI >35 with diabetes, high chol, HTN; urged weight loss, 1 pound per week x 26 weeks to reach 174 pounds in 6 months

## 2018-09-22 NOTE — Assessment & Plan Note (Signed)
Better control with 80 of atorvastatin; add zetia; healthier diet, weight lsos encouraged

## 2018-09-22 NOTE — Assessment & Plan Note (Signed)
Foot exam by MD; encouraged weight loss

## 2018-09-22 NOTE — Addendum Note (Signed)
Addended by: Mirinda Monte, Satira Anis on: 09/22/2018 08:20 AM   Modules accepted: Orders

## 2018-09-22 NOTE — Assessment & Plan Note (Signed)
Encouraged regular f/u with eye professional

## 2018-09-27 ENCOUNTER — Other Ambulatory Visit: Payer: Self-pay | Admitting: Nurse Practitioner

## 2018-09-27 DIAGNOSIS — N289 Disorder of kidney and ureter, unspecified: Secondary | ICD-10-CM

## 2018-09-27 DIAGNOSIS — I10 Essential (primary) hypertension: Secondary | ICD-10-CM

## 2018-09-29 NOTE — Telephone Encounter (Signed)
Patient should not be out of valsartan or amlodipine until the end of December See last Rxs written by Suezanne Cheshire, DNP Please confirm she is taking these medicines correctly, resolve with pharmacy if needed

## 2018-10-03 ENCOUNTER — Encounter: Payer: Self-pay | Admitting: Gastroenterology

## 2018-10-14 ENCOUNTER — Ambulatory Visit
Admission: RE | Admit: 2018-10-14 | Discharge: 2018-10-14 | Disposition: A | Discharge status: Home / Self Care | Payer: 59 | Source: Ambulatory Visit | Attending: Nephrology | Admitting: Nephrology

## 2018-10-14 DIAGNOSIS — N183 Chronic kidney disease, stage 3 (moderate): Secondary | ICD-10-CM | POA: Insufficient documentation

## 2018-10-14 DIAGNOSIS — R809 Proteinuria, unspecified: Secondary | ICD-10-CM | POA: Insufficient documentation

## 2018-10-16 DIAGNOSIS — I129 Hypertensive chronic kidney disease with stage 1 through stage 4 chronic kidney disease, or unspecified chronic kidney disease: Secondary | ICD-10-CM | POA: Diagnosis not present

## 2018-10-16 DIAGNOSIS — N183 Chronic kidney disease, stage 3 (moderate): Secondary | ICD-10-CM | POA: Diagnosis not present

## 2018-10-16 DIAGNOSIS — E1122 Type 2 diabetes mellitus with diabetic chronic kidney disease: Secondary | ICD-10-CM | POA: Diagnosis not present

## 2018-10-16 DIAGNOSIS — R809 Proteinuria, unspecified: Secondary | ICD-10-CM | POA: Diagnosis not present

## 2018-10-20 ENCOUNTER — Other Ambulatory Visit: Payer: Self-pay | Admitting: Family Medicine

## 2018-10-24 ENCOUNTER — Other Ambulatory Visit: Payer: Self-pay

## 2018-10-24 NOTE — Telephone Encounter (Signed)
Metformin and gabapentin requested almost two full months too soon Not due yet

## 2018-10-24 NOTE — Telephone Encounter (Signed)
Refill request was sent to Dr. Melinda Lada for approval and submission.  

## 2018-10-27 ENCOUNTER — Telehealth: Payer: Self-pay | Admitting: Family Medicine

## 2018-10-27 NOTE — Telephone Encounter (Signed)
Copied from [place] [id number]. Topic: Quick Communication - Rx Refill/Question >> Oct 27, 2018  4:09 PM Blase Mess A wrote: Medication: gabapentin (NEURONTIN) 300 MG capsule [[id number]] , metFORMIN (GLUCOPHAGE) 1000 MG tablet [[id number]]   Has the patient contacted their pharmacy? Yes  (Agent: If no, request that the patient contact the pharmacy for the refill.) (Agent: If yes, when and what did the pharmacy advise?)  Preferred Pharmacy (with phone number or street name): [place], [place] [phone] (Phone) [phone] (Fax)    Agent: Please be advised that RX refills may take up to 3 business days. We ask that you follow-up with your pharmacy.

## 2018-10-28 ENCOUNTER — Other Ambulatory Visit: Payer: Self-pay | Admitting: Family Medicine

## 2018-10-28 NOTE — Telephone Encounter (Signed)
Refills requested too soon

## 2018-10-30 NOTE — Telephone Encounter (Signed)
Lab Results  Component Value Date   HGBA1C 6.6 (H) 06/19/2018

## 2018-11-03 DIAGNOSIS — E113393 Type 2 diabetes mellitus with moderate nonproliferative diabetic retinopathy without macular edema, bilateral: Secondary | ICD-10-CM | POA: Diagnosis not present

## 2018-11-03 DIAGNOSIS — E113391 Type 2 diabetes mellitus with moderate nonproliferative diabetic retinopathy without macular edema, right eye: Secondary | ICD-10-CM | POA: Diagnosis not present

## 2018-11-03 LAB — HM DIABETES EYE EXAM

## 2018-11-05 ENCOUNTER — Other Ambulatory Visit: Payer: Self-pay | Admitting: Family Medicine

## 2018-11-12 ENCOUNTER — Encounter: Payer: Self-pay | Admitting: Family Medicine

## 2018-11-13 ENCOUNTER — Other Ambulatory Visit: Payer: Self-pay

## 2018-11-13 DIAGNOSIS — I10 Essential (primary) hypertension: Secondary | ICD-10-CM

## 2018-11-13 MED ORDER — INSULIN PEN NEEDLE 31G X 5 MM MISC: 3 refills | Status: DC

## 2018-11-13 MED ORDER — AMLODIPINE BESYLATE 5 MG PO TABS: 5.0000 mg | ORAL_TABLET | Freq: Every day | ORAL | 0 refills | Status: DC

## 2018-11-13 NOTE — Telephone Encounter (Signed)
Also sent to Dr. Sanda Klein

## 2018-11-13 NOTE — Telephone Encounter (Signed)
Refill request was sent to Fredderick Severance, DNP for approval and submission.

## 2018-11-14 ENCOUNTER — Encounter (HOSPITAL_BASED_OUTPATIENT_CLINIC_OR_DEPARTMENT_OTHER): Payer: Self-pay

## 2018-11-14 ENCOUNTER — Emergency Department (HOSPITAL_BASED_OUTPATIENT_CLINIC_OR_DEPARTMENT_OTHER): Payer: 59

## 2018-11-14 ENCOUNTER — Inpatient Hospital Stay (HOSPITAL_BASED_OUTPATIENT_CLINIC_OR_DEPARTMENT_OTHER)
Admission: EM | Admit: 2018-11-14 | Discharge: 2018-11-18 | DRG: 291 | Disposition: A | Discharge status: Home / Self Care | Payer: 59 | Attending: Internal Medicine | Admitting: Internal Medicine

## 2018-11-14 ENCOUNTER — Other Ambulatory Visit: Payer: Self-pay

## 2018-11-14 DIAGNOSIS — J45909 Unspecified asthma, uncomplicated: Secondary | ICD-10-CM | POA: Diagnosis not present

## 2018-11-14 DIAGNOSIS — E1122 Type 2 diabetes mellitus with diabetic chronic kidney disease: Secondary | ICD-10-CM | POA: Diagnosis present

## 2018-11-14 DIAGNOSIS — N179 Acute kidney failure, unspecified: Secondary | ICD-10-CM

## 2018-11-14 DIAGNOSIS — Z833 Family history of diabetes mellitus: Secondary | ICD-10-CM

## 2018-11-14 DIAGNOSIS — I5021 Acute systolic (congestive) heart failure: Secondary | ICD-10-CM | POA: Diagnosis present

## 2018-11-14 DIAGNOSIS — Z79899 Other long term (current) drug therapy: Secondary | ICD-10-CM | POA: Diagnosis not present

## 2018-11-14 DIAGNOSIS — R0602 Shortness of breath: Secondary | ICD-10-CM | POA: Diagnosis not present

## 2018-11-14 DIAGNOSIS — R042 Hemoptysis: Secondary | ICD-10-CM | POA: Diagnosis not present

## 2018-11-14 DIAGNOSIS — E11319 Type 2 diabetes mellitus with unspecified diabetic retinopathy without macular edema: Secondary | ICD-10-CM | POA: Diagnosis present

## 2018-11-14 DIAGNOSIS — Z7982 Long term (current) use of aspirin: Secondary | ICD-10-CM | POA: Diagnosis not present

## 2018-11-14 DIAGNOSIS — R062 Wheezing: Secondary | ICD-10-CM | POA: Diagnosis not present

## 2018-11-14 DIAGNOSIS — I160 Hypertensive urgency: Secondary | ICD-10-CM | POA: Diagnosis present

## 2018-11-14 DIAGNOSIS — Z794 Long term (current) use of insulin: Secondary | ICD-10-CM | POA: Diagnosis not present

## 2018-11-14 DIAGNOSIS — N183 Chronic kidney disease, stage 3 (moderate): Secondary | ICD-10-CM | POA: Diagnosis not present

## 2018-11-14 DIAGNOSIS — I10 Essential (primary) hypertension: Secondary | ICD-10-CM | POA: Diagnosis present

## 2018-11-14 DIAGNOSIS — Z6835 Body mass index (BMI) 35.0-35.9, adult: Secondary | ICD-10-CM

## 2018-11-14 DIAGNOSIS — J181 Lobar pneumonia, unspecified organism: Secondary | ICD-10-CM | POA: Diagnosis not present

## 2018-11-14 DIAGNOSIS — J9601 Acute respiratory failure with hypoxia: Secondary | ICD-10-CM

## 2018-11-14 DIAGNOSIS — G43909 Migraine, unspecified, not intractable, without status migrainosus: Secondary | ICD-10-CM | POA: Diagnosis not present

## 2018-11-14 DIAGNOSIS — I130 Hypertensive heart and chronic kidney disease with heart failure and stage 1 through stage 4 chronic kidney disease, or unspecified chronic kidney disease: Principal | ICD-10-CM | POA: Diagnosis present

## 2018-11-14 DIAGNOSIS — E1142 Type 2 diabetes mellitus with diabetic polyneuropathy: Secondary | ICD-10-CM | POA: Diagnosis not present

## 2018-11-14 DIAGNOSIS — E785 Hyperlipidemia, unspecified: Secondary | ICD-10-CM | POA: Diagnosis not present

## 2018-11-14 DIAGNOSIS — Z8349 Family history of other endocrine, nutritional and metabolic diseases: Secondary | ICD-10-CM

## 2018-11-14 LAB — CBC WITH DIFFERENTIAL/PLATELET
Abs Immature Granulocytes: 0.03 10*3/uL (ref 0.00–0.07)
BASOS ABS: 0.1 10*3/uL (ref 0.0–0.1)
Basophils Relative: 1 %
Eosinophils Absolute: 0.4 10*3/uL (ref 0.0–0.5)
Eosinophils Relative: 5 %
HCT: 36.9 % (ref 36.0–46.0)
Hemoglobin: 11.6 g/dL — ABNORMAL LOW (ref 12.0–15.0)
Immature Granulocytes: 0 %
Lymphocytes Relative: 30 %
Lymphs Abs: 2.3 10*3/uL (ref 0.7–4.0)
MCH: 29.5 pg (ref 26.0–34.0)
MCHC: 31.4 g/dL (ref 30.0–36.0)
MCV: 93.9 fL (ref 80.0–100.0)
Monocytes Absolute: 0.5 10*3/uL (ref 0.1–1.0)
Monocytes Relative: 7 %
NRBC: 0 % (ref 0.0–0.2)
Neutro Abs: 4.3 10*3/uL (ref 1.7–7.7)
Neutrophils Relative %: 57 %
Platelets: 305 10*3/uL (ref 150–400)
RBC: 3.93 MIL/uL (ref 3.87–5.11)
RDW: 12.7 % (ref 11.5–15.5)
WBC: 7.6 10*3/uL (ref 4.0–10.5)

## 2018-11-14 LAB — I-STAT VENOUS BLOOD GAS, ED
Acid-base deficit: 3 mmol/L — ABNORMAL HIGH (ref 0.0–2.0)
Bicarbonate: 23.3 mmol/L (ref 20.0–28.0)
O2 Saturation: 43 %
Patient temperature: 98.6
TCO2: 25 mmol/L (ref 22–32)
pCO2, Ven: 47.9 mmHg (ref 44.0–60.0)
pH, Ven: 7.294 (ref 7.250–7.430)
pO2, Ven: 27 mmHg — CL (ref 32.0–45.0)

## 2018-11-14 LAB — URINALYSIS, MICROSCOPIC (REFLEX): Bacteria, UA: NONE SEEN

## 2018-11-14 LAB — URINALYSIS, ROUTINE W REFLEX MICROSCOPIC
Bilirubin Urine: NEGATIVE
Glucose, UA: 250 mg/dL — AB
Ketones, ur: NEGATIVE mg/dL
Leukocytes, UA: NEGATIVE
Nitrite: NEGATIVE
Protein, ur: 100 mg/dL — AB
Specific Gravity, Urine: 1.01 (ref 1.005–1.030)
pH: 6.5 (ref 5.0–8.0)

## 2018-11-14 LAB — COMPREHENSIVE METABOLIC PANEL
ALBUMIN: 3.6 g/dL (ref 3.5–5.0)
ALT: 17 U/L (ref 0–44)
AST: 18 U/L (ref 15–41)
Alkaline Phosphatase: 64 U/L (ref 38–126)
Anion gap: 7 (ref 5–15)
BUN: 25 mg/dL — ABNORMAL HIGH (ref 6–20)
CHLORIDE: 106 mmol/L (ref 98–111)
CO2: 23 mmol/L (ref 22–32)
Calcium: 9.2 mg/dL (ref 8.9–10.3)
Creatinine, Ser: 1.46 mg/dL — ABNORMAL HIGH (ref 0.44–1.00)
GFR calc Af Amer: 48 mL/min — ABNORMAL LOW (ref >60)
GFR calc non Af Amer: 42 mL/min — ABNORMAL LOW (ref >60)
Glucose, Bld: 213 mg/dL — ABNORMAL HIGH (ref 70–99)
POTASSIUM: 4.1 mmol/L (ref 3.5–5.1)
Sodium: 136 mmol/L (ref 135–145)
Total Bilirubin: 0.9 mg/dL (ref 0.3–1.2)
Total Protein: 8 g/dL (ref 6.5–8.1)

## 2018-11-14 LAB — RAPID URINE DRUG SCREEN, HOSP PERFORMED
Amphetamines: NOT DETECTED
Barbiturates: NOT DETECTED
Benzodiazepines: NOT DETECTED
Cocaine: NOT DETECTED
Opiates: NOT DETECTED
Tetrahydrocannabinol: NOT DETECTED

## 2018-11-14 LAB — TROPONIN I: Troponin I: <0.03 ng/mL (ref <0.03)

## 2018-11-14 LAB — I-STAT CG4 LACTIC ACID, ED: Lactic Acid, Venous: 2.32 mmol/L (ref 0.5–1.9)

## 2018-11-14 LAB — BRAIN NATRIURETIC PEPTIDE: B Natriuretic Peptide: 102.8 pg/mL — ABNORMAL HIGH (ref 0.0–100.0)

## 2018-11-14 MED ORDER — ALBUTEROL SULFATE (2.5 MG/3ML) 0.083% IN NEBU: 5.0000 mg | INHALATION_SOLUTION | Freq: Once | RESPIRATORY_TRACT | Status: DC

## 2018-11-14 MED ORDER — AZITHROMYCIN 500 MG IV SOLR
INTRAVENOUS | Status: AC
  Filled 2018-11-14: qty 500

## 2018-11-14 MED ORDER — ALBUTEROL SULFATE (2.5 MG/3ML) 0.083% IN NEBU
5.0000 mg | INHALATION_SOLUTION | Freq: Once | RESPIRATORY_TRACT | Status: AC
  Administered 2018-11-14: 5 mg via RESPIRATORY_TRACT
  Filled 2018-11-14: qty 6

## 2018-11-14 MED ORDER — IOPAMIDOL (ISOVUE-370) INJECTION 76%
100.0000 mL | Freq: Once | INTRAVENOUS | Status: AC
  Administered 2018-11-14: 100 mL via INTRAVENOUS

## 2018-11-14 MED ORDER — ALBUTEROL SULFATE (2.5 MG/3ML) 0.083% IN NEBU
2.5000 mg | INHALATION_SOLUTION | Freq: Once | RESPIRATORY_TRACT | Status: AC
  Administered 2018-11-14: 2.5 mg via RESPIRATORY_TRACT
  Filled 2018-11-14: qty 3

## 2018-11-14 MED ORDER — IPRATROPIUM-ALBUTEROL 0.5-2.5 (3) MG/3ML IN SOLN
3.0000 mL | Freq: Once | RESPIRATORY_TRACT | Status: AC
  Administered 2018-11-14: 3 mL via RESPIRATORY_TRACT
  Filled 2018-11-14: qty 3

## 2018-11-14 MED ORDER — SODIUM CHLORIDE 0.9 % IV SOLN
500.0000 mg | Freq: Once | INTRAVENOUS | Status: AC
  Administered 2018-11-14: 500 mg via INTRAVENOUS
  Filled 2018-11-14: qty 500

## 2018-11-14 MED ORDER — LABETALOL HCL 5 MG/ML IV SOLN
10.0000 mg | Freq: Once | INTRAVENOUS | Status: AC
  Administered 2018-11-14: 10 mg via INTRAVENOUS
  Filled 2018-11-14: qty 4

## 2018-11-14 MED ORDER — SODIUM CHLORIDE 0.9 % IV SOLN
1.0000 g | Freq: Once | INTRAVENOUS | Status: AC
  Administered 2018-11-14: 1 g via INTRAVENOUS
  Filled 2018-11-14: qty 10

## 2018-11-14 MED ORDER — SODIUM CHLORIDE 0.9 % IV BOLUS
1000.0000 mL | Freq: Once | INTRAVENOUS | Status: AC
  Administered 2018-11-14: 1000 mL via INTRAVENOUS

## 2018-11-14 NOTE — ED Notes (Signed)
Only able to obtain one set of blood cultures, EDP aware

## 2018-11-14 NOTE — ED Notes (Signed)
Date and time results received: 11/14/18 2322 (use smartphrase ".now" to insert current time)  Test:ISTAT Lactic Acid Critical Value: 2.32  Name of Provider Notified: A. Law PA.C  Orders Received? Or Actions Taken?:

## 2018-11-14 NOTE — ED Notes (Signed)
Bolus slowed down per EDP

## 2018-11-14 NOTE — ED Triage Notes (Addendum)
Pt c/o coughing up blood, SOB x 2.5 hours-ambulatory to triage-labored breathing noted

## 2018-11-14 NOTE — ED Notes (Signed)
Patient transported to radiology

## 2018-11-14 NOTE — ED Notes (Signed)
Patient was on breathing tx, now on bedside commode when attempted to transport for xray

## 2018-11-15 ENCOUNTER — Encounter (HOSPITAL_COMMUNITY): Payer: Self-pay | Admitting: Family Medicine

## 2018-11-15 ENCOUNTER — Inpatient Hospital Stay (HOSPITAL_COMMUNITY): Payer: 59

## 2018-11-15 ENCOUNTER — Other Ambulatory Visit: Payer: Self-pay

## 2018-11-15 DIAGNOSIS — J45909 Unspecified asthma, uncomplicated: Secondary | ICD-10-CM | POA: Diagnosis present

## 2018-11-15 DIAGNOSIS — R042 Hemoptysis: Secondary | ICD-10-CM | POA: Diagnosis not present

## 2018-11-15 DIAGNOSIS — Z794 Long term (current) use of insulin: Secondary | ICD-10-CM | POA: Diagnosis not present

## 2018-11-15 DIAGNOSIS — Z7982 Long term (current) use of aspirin: Secondary | ICD-10-CM | POA: Diagnosis not present

## 2018-11-15 DIAGNOSIS — R0602 Shortness of breath: Secondary | ICD-10-CM

## 2018-11-15 DIAGNOSIS — I130 Hypertensive heart and chronic kidney disease with heart failure and stage 1 through stage 4 chronic kidney disease, or unspecified chronic kidney disease: Secondary | ICD-10-CM | POA: Diagnosis not present

## 2018-11-15 DIAGNOSIS — Z8349 Family history of other endocrine, nutritional and metabolic diseases: Secondary | ICD-10-CM | POA: Diagnosis not present

## 2018-11-15 DIAGNOSIS — Z79899 Other long term (current) drug therapy: Secondary | ICD-10-CM | POA: Diagnosis not present

## 2018-11-15 DIAGNOSIS — E785 Hyperlipidemia, unspecified: Secondary | ICD-10-CM | POA: Diagnosis present

## 2018-11-15 DIAGNOSIS — Z6835 Body mass index (BMI) 35.0-35.9, adult: Secondary | ICD-10-CM | POA: Diagnosis not present

## 2018-11-15 DIAGNOSIS — J9601 Acute respiratory failure with hypoxia: Secondary | ICD-10-CM | POA: Diagnosis not present

## 2018-11-15 DIAGNOSIS — I10 Essential (primary) hypertension: Secondary | ICD-10-CM | POA: Diagnosis present

## 2018-11-15 DIAGNOSIS — N179 Acute kidney failure, unspecified: Secondary | ICD-10-CM | POA: Diagnosis not present

## 2018-11-15 DIAGNOSIS — G43909 Migraine, unspecified, not intractable, without status migrainosus: Secondary | ICD-10-CM | POA: Diagnosis present

## 2018-11-15 DIAGNOSIS — E1122 Type 2 diabetes mellitus with diabetic chronic kidney disease: Secondary | ICD-10-CM | POA: Diagnosis present

## 2018-11-15 DIAGNOSIS — I5021 Acute systolic (congestive) heart failure: Secondary | ICD-10-CM | POA: Diagnosis not present

## 2018-11-15 DIAGNOSIS — E11319 Type 2 diabetes mellitus with unspecified diabetic retinopathy without macular edema: Secondary | ICD-10-CM | POA: Diagnosis present

## 2018-11-15 DIAGNOSIS — E1142 Type 2 diabetes mellitus with diabetic polyneuropathy: Secondary | ICD-10-CM

## 2018-11-15 DIAGNOSIS — N289 Disorder of kidney and ureter, unspecified: Secondary | ICD-10-CM | POA: Diagnosis not present

## 2018-11-15 DIAGNOSIS — Z833 Family history of diabetes mellitus: Secondary | ICD-10-CM | POA: Diagnosis not present

## 2018-11-15 DIAGNOSIS — I160 Hypertensive urgency: Secondary | ICD-10-CM | POA: Diagnosis present

## 2018-11-15 DIAGNOSIS — N183 Chronic kidney disease, stage 3 (moderate): Secondary | ICD-10-CM | POA: Diagnosis present

## 2018-11-15 LAB — CBC
HCT: 31.9 % — ABNORMAL LOW (ref 36.0–46.0)
Hemoglobin: 10.4 g/dL — ABNORMAL LOW (ref 12.0–15.0)
MCH: 30.8 pg (ref 26.0–34.0)
MCHC: 32.6 g/dL (ref 30.0–36.0)
MCV: 94.4 fL (ref 80.0–100.0)
Platelets: 258 10*3/uL (ref 150–400)
RBC: 3.38 MIL/uL — ABNORMAL LOW (ref 3.87–5.11)
RDW: 12.9 % (ref 11.5–15.5)
WBC: 8.1 10*3/uL (ref 4.0–10.5)
nRBC: 0 % (ref 0.0–0.2)

## 2018-11-15 LAB — ECHOCARDIOGRAM COMPLETE
Height: 64 in
Weight: 3294.55 oz

## 2018-11-15 LAB — GLUCOSE, CAPILLARY
Glucose-Capillary: 334 mg/dL — ABNORMAL HIGH (ref 70–99)
Glucose-Capillary: 406 mg/dL — ABNORMAL HIGH (ref 70–99)
Glucose-Capillary: 408 mg/dL — ABNORMAL HIGH (ref 70–99)
Glucose-Capillary: 380 mg/dL — ABNORMAL HIGH (ref 70–99)
Glucose-Capillary: 181 mg/dL — ABNORMAL HIGH (ref 70–99)

## 2018-11-15 LAB — INFLUENZA PANEL BY PCR (TYPE A & B)
Influenza A By PCR: NEGATIVE
Influenza B By PCR: NEGATIVE

## 2018-11-15 LAB — BASIC METABOLIC PANEL
Anion gap: 10 (ref 5–15)
BUN: 20 mg/dL (ref 6–20)
CALCIUM: 8.7 mg/dL — AB (ref 8.9–10.3)
CO2: 19 mmol/L — ABNORMAL LOW (ref 22–32)
CREATININE: 1.52 mg/dL — AB (ref 0.44–1.00)
Chloride: 108 mmol/L (ref 98–111)
GFR calc Af Amer: 46 mL/min — ABNORMAL LOW (ref >60)
GFR calc non Af Amer: 40 mL/min — ABNORMAL LOW (ref >60)
Glucose, Bld: 276 mg/dL — ABNORMAL HIGH (ref 70–99)
Potassium: 4.6 mmol/L (ref 3.5–5.1)
Sodium: 137 mmol/L (ref 135–145)

## 2018-11-15 LAB — TROPONIN I
Troponin I: <0.03 ng/mL (ref <0.03)
Troponin I: <0.03 ng/mL (ref <0.03)
Troponin I: <0.03 ng/mL (ref <0.03)

## 2018-11-15 LAB — MRSA PCR SCREENING: MRSA by PCR: NEGATIVE

## 2018-11-15 MED ORDER — INSULIN GLARGINE 100 UNIT/ML ~~LOC~~ SOLN
45.0000 [IU] | Freq: Every day | SUBCUTANEOUS | Status: DC
  Administered 2018-11-15 – 2018-11-17 (×3): 45 [IU] via SUBCUTANEOUS
  Filled 2018-11-15 (×3): qty 0.45

## 2018-11-15 MED ORDER — INSULIN ASPART 100 UNIT/ML ~~LOC~~ SOLN
0.0000 [IU] | Freq: Three times a day (TID) | SUBCUTANEOUS | Status: DC
  Administered 2018-11-15 (×2): 9 [IU] via SUBCUTANEOUS
  Administered 2018-11-15 – 2018-11-16 (×2): 7 [IU] via SUBCUTANEOUS
  Administered 2018-11-16: 2 [IU] via SUBCUTANEOUS
  Administered 2018-11-16 – 2018-11-17 (×2): 5 [IU] via SUBCUTANEOUS
  Administered 2018-11-18: 1 [IU] via SUBCUTANEOUS

## 2018-11-15 MED ORDER — LABETALOL HCL 5 MG/ML IV SOLN
10.0000 mg | INTRAVENOUS | Status: DC | PRN
  Administered 2018-11-15 (×3): 10 mg via INTRAVENOUS
  Filled 2018-11-15 (×3): qty 4

## 2018-11-15 MED ORDER — ALBUTEROL (5 MG/ML) CONTINUOUS INHALATION SOLN
10.0000 mg/h | INHALATION_SOLUTION | RESPIRATORY_TRACT | Status: DC
  Administered 2018-11-15: 10 mg/h via RESPIRATORY_TRACT
  Filled 2018-11-15: qty 20

## 2018-11-15 MED ORDER — POTASSIUM CHLORIDE CRYS ER 20 MEQ PO TBCR: 20.0000 meq | EXTENDED_RELEASE_TABLET | Freq: Once | ORAL | Status: DC

## 2018-11-15 MED ORDER — INSULIN GLARGINE 100 UNIT/ML ~~LOC~~ SOLN: 45.0000 [IU] | Freq: Every day | SUBCUTANEOUS | Status: DC

## 2018-11-15 MED ORDER — ATORVASTATIN CALCIUM 80 MG PO TABS
80.0000 mg | ORAL_TABLET | Freq: Every day | ORAL | Status: DC
  Administered 2018-11-15 – 2018-11-18 (×4): 80 mg via ORAL
  Filled 2018-11-15 (×4): qty 1

## 2018-11-15 MED ORDER — AMLODIPINE BESYLATE 10 MG PO TABS
10.0000 mg | ORAL_TABLET | Freq: Every day | ORAL | Status: DC
  Administered 2018-11-15 – 2018-11-18 (×4): 10 mg via ORAL
  Filled 2018-11-15 (×3): qty 1

## 2018-11-15 MED ORDER — HYDRALAZINE HCL 20 MG/ML IJ SOLN: 5.0000 mg | Freq: Four times a day (QID) | INTRAMUSCULAR | Status: DC | PRN

## 2018-11-15 MED ORDER — IRBESARTAN 150 MG PO TABS
150.0000 mg | ORAL_TABLET | Freq: Every day | ORAL | Status: DC
  Administered 2018-11-15: 150 mg via ORAL
  Filled 2018-11-15: qty 1

## 2018-11-15 MED ORDER — ACETAMINOPHEN 325 MG PO TABS
650.0000 mg | ORAL_TABLET | Freq: Four times a day (QID) | ORAL | Status: DC | PRN
  Administered 2018-11-16: 650 mg via ORAL
  Filled 2018-11-15: qty 2

## 2018-11-15 MED ORDER — EZETIMIBE 10 MG PO TABS
10.0000 mg | ORAL_TABLET | Freq: Every day | ORAL | Status: DC
  Administered 2018-11-15 – 2018-11-18 (×4): 10 mg via ORAL
  Filled 2018-11-15 (×4): qty 1

## 2018-11-15 MED ORDER — HYDRALAZINE HCL 50 MG PO TABS
50.0000 mg | ORAL_TABLET | Freq: Three times a day (TID) | ORAL | Status: DC
  Administered 2018-11-15 – 2018-11-16 (×3): 50 mg via ORAL
  Filled 2018-11-15 (×3): qty 1

## 2018-11-15 MED ORDER — FUROSEMIDE 10 MG/ML IJ SOLN: 40.0000 mg | Freq: Every day | INTRAMUSCULAR | Status: DC

## 2018-11-15 MED ORDER — GABAPENTIN 300 MG PO CAPS
300.0000 mg | ORAL_CAPSULE | Freq: Three times a day (TID) | ORAL | Status: DC
  Administered 2018-11-15 – 2018-11-18 (×9): 300 mg via ORAL
  Filled 2018-11-15 (×9): qty 1

## 2018-11-15 MED ORDER — HYDRALAZINE HCL 20 MG/ML IJ SOLN
10.0000 mg | Freq: Four times a day (QID) | INTRAMUSCULAR | Status: DC | PRN
  Administered 2018-11-15 (×2): 10 mg via INTRAVENOUS
  Filled 2018-11-15 (×2): qty 1

## 2018-11-15 MED ORDER — AMLODIPINE BESYLATE 5 MG PO TABS
5.0000 mg | ORAL_TABLET | Freq: Every day | ORAL | Status: DC
  Filled 2018-11-15: qty 1

## 2018-11-15 MED ORDER — MAGNESIUM SULFATE 2 GM/50ML IV SOLN
2.0000 g | Freq: Once | INTRAVENOUS | Status: AC
  Administered 2018-11-15: 2 g via INTRAVENOUS
  Filled 2018-11-15: qty 50

## 2018-11-15 MED ORDER — FUROSEMIDE 10 MG/ML IJ SOLN
80.0000 mg | Freq: Once | INTRAMUSCULAR | Status: AC
  Administered 2018-11-15: 80 mg via INTRAVENOUS
  Filled 2018-11-15: qty 8

## 2018-11-15 MED ORDER — HYDRALAZINE HCL 25 MG PO TABS
25.0000 mg | ORAL_TABLET | Freq: Three times a day (TID) | ORAL | Status: DC
  Administered 2018-11-15: 25 mg via ORAL
  Filled 2018-11-15: qty 1

## 2018-11-15 MED ORDER — INSULIN GLARGINE 100 UNIT/ML ~~LOC~~ SOLN: 30.0000 [IU] | Freq: Every day | SUBCUTANEOUS | Status: DC

## 2018-11-15 MED ORDER — MENTHOL 3 MG MT LOZG
1.0000 | LOZENGE | OROMUCOSAL | Status: DC | PRN
  Administered 2018-11-15: 3 mg via ORAL
  Filled 2018-11-15: qty 9

## 2018-11-15 MED ORDER — INSULIN ASPART 100 UNIT/ML ~~LOC~~ SOLN
6.0000 [IU] | Freq: Once | SUBCUTANEOUS | Status: AC
  Administered 2018-11-15: 6 [IU] via SUBCUTANEOUS

## 2018-11-15 MED ORDER — SODIUM CHLORIDE 0.9% FLUSH: 3.0000 mL | INTRAVENOUS | Status: DC | PRN

## 2018-11-15 MED ORDER — METHYLPREDNISOLONE SODIUM SUCC 125 MG IJ SOLR
125.0000 mg | Freq: Once | INTRAMUSCULAR | Status: AC
  Administered 2018-11-15: 125 mg via INTRAVENOUS
  Filled 2018-11-15: qty 2

## 2018-11-15 MED ORDER — SODIUM CHLORIDE 0.9% FLUSH
3.0000 mL | Freq: Two times a day (BID) | INTRAVENOUS | Status: DC
  Administered 2018-11-15 – 2018-11-18 (×9): 3 mL via INTRAVENOUS

## 2018-11-15 MED ORDER — SODIUM CHLORIDE 0.9 % IV SOLN: 250.0000 mL | INTRAVENOUS | Status: DC | PRN

## 2018-11-15 NOTE — ED Provider Notes (Signed)
[facility] EMERGENCY DEPARTMENT Provider Note   CSN: [id number] Arrival date & time: 11/14/18  2121     History   Chief Complaint Chief Complaint  Patient presents with  . Cough    HPI Janice Jennings is a 48 y.o. female with history of asthma, diabetes, hypertension, hyperlipidemia who presents with acute onset hemoptysis and shortness of breath.  Patient reports she was having sexual intercourse when she suddenly had mucus and coughed up bright red blood.  It was mixed with sputum.  She has been coughing up pink sputum since.  She was feeling fine prior to the episode.  She has not been feeling sick lately.  She has not been coughing.  She denies any fevers.  She denies any chest pain, abdominal pain, nausea, vomiting.  She denies any drug use.  She does not remember she took her blood pressure medication this morning.  She denies any recent long trips, surgeries, known cancer, new leg pain or swelling, history of blood clots.  She is not on anticoagulation except for 81 aspirin daily.  HPI  Past Medical History:  Diagnosis Date  . Asthma   . Diabetes mellitus without complication ([place])   . Diabetic retinopathy of both eyes associated with type 2 diabetes mellitus ([place]) 04/11/2018   Moderate to severe, non-proliferative DR; referred to retinal specialist; April 10, 2018  . Hyperlipidemia   . Hypertension   . Migraine    Excedrin Tension HA OTC    Patient Active Problem List   Diagnosis Date Noted  . Morbid obesity ([place]) 09/22/2018  . Anemia 06/19/2018  . High cholesterol 06/19/2018  . Diabetic retinopathy of both eyes associated with type 2 diabetes mellitus ([place]) 04/11/2018  . Essential hypertension 03/07/2015  . DM type 2 with diabetic peripheral neuropathy ([place]) 05/26/2014    Past Surgical History:  Procedure Laterality Date  . BREAST REDUCTION SURGERY  2001  . CESAREAN SECTION    . COLONOSCOPY WITH PROPOFOL N/A 08/15/2018   Procedure: COLONOSCOPY WITH  PROPOFOL;  Surgeon: Jonathon Bellows, MD;  Location: [facility] ENDOSCOPY;  Service: Gastroenterology;  Laterality: N/A;  . ESOPHAGOGASTRODUODENOSCOPY (EGD) WITH PROPOFOL N/A 08/15/2018   Procedure: ESOPHAGOGASTRODUODENOSCOPY (EGD) WITH PROPOFOL;  Surgeon: Jonathon Bellows, MD;  Location: [facility] ENDOSCOPY;  Service: Gastroenterology;  Laterality: N/A;  . GIVENS CAPSULE STUDY N/A 09/08/2018   Procedure: GIVENS CAPSULE STUDY;  Surgeon: Jonathon Bellows, MD;  Location: [facility] ENDOSCOPY;  Service: Gastroenterology;  Laterality: N/A;  . REDUCTION MAMMAPLASTY Bilateral 2000  . TUBAL LIGATION       OB History    Gravida  4   Para  2   Term  1   Preterm      AB  2   Living  2     SAB  1   TAB  1   Ectopic      Multiple      Live Births  2            Home Medications    Prior to Admission medications   Medication Sig Start Date End Date Taking? Authorizing Provider  albuterol (PROVENTIL HFA;VENTOLIN HFA) 108 (90 Base) MCG/ACT inhaler Inhale 2 puffs into the lungs every 4 (four) hours as needed for wheezing or shortness of breath. 07/08/18   Arnetha Courser, MD  amLODipine (NORVASC) 5 MG tablet Take 1 tablet (5 mg total) by mouth daily. 11/13/18   Poulose, Bethel Born, NP  aspirin EC 81 MG tablet Take 81 mg by mouth  daily.    [provider]  atorvastatin (LIPITOR) 80 MG tablet Take 1 tablet (80 mg total) by mouth daily. For cholesterol 09/04/18   Lada, Satira Anis, MD  Cholecalciferol (VITAMIN D3) 50000 units CAPS TAKE ONE CAPSULE BY MOUTH ONE TIME PER WEEK 07/12/18   [provider]  Cinnamon 500 MG capsule Take 500 mg by mouth daily.    [provider]  Continuous Blood Gluc Receiver (FREESTYLE LIBRE READER) DEVI 1 each by Does not apply route 3 (three) times daily. Check sugars 3x a day; ICD 10 E11.319  LON 99 months 06/19/18   Arnetha Courser, MD  Continuous Blood Gluc Sensor (FREESTYLE LIBRE 14 DAY SENSOR) MISC 1 each by Does not apply route 3 (three) times daily. Check  sugars 3x a day; ICD 10 E11.319  LON 99 months 06/19/18   Arnetha Courser, MD  ezetimibe (ZETIA) 10 MG tablet Take 1 tablet (10 mg total) by mouth daily. 09/22/18   Arnetha Courser, MD  ferrous sulfate 325 (65 FE) MG tablet Take 325 mg by mouth daily with breakfast.    [provider]  gabapentin (NEURONTIN) 300 MG capsule Take 1 capsule (300 mg total) by mouth 3 (three) times daily. 06/19/18   Arnetha Courser, MD  GARCINIA CAMBOGIA-CHROMIUM PO Take 1 capsule by mouth daily.    [provider]  glucose blood (ONE TOUCH ULTRA TEST) test strip Use to check blood sugar two times a day. 07/12/16   Jearld Fenton, NP  Insulin Glargine (BASAGLAR KWIKPEN) 100 UNIT/ML SOPN INJECT SUBCUTANEOUSLY 50  UNITS DAILY AT 10 PM 10/30/18   Lada, Satira Anis, MD  Insulin Pen Needle 31G X 5 MM MISC Use one pen needle two times daily. 11/13/18   Poulose, Bethel Born, NP  metFORMIN (GLUCOPHAGE) 1000 MG tablet Take 1 tablet (1,000 mg total) by mouth 2 (two) times daily with a meal. 06/19/18   Lada, Satira Anis, MD  metroNIDAZOLE (METROGEL) 0.75 % gel Apply 1 application topically 2 (two) times daily. 07/08/18   Arnetha Courser, MD  Multiple Vitamin (MULTIVITAMIN) tablet Take 1 tablet by mouth daily.    [provider]  [facility] DELICA LANCETS 81X MISC 1 each by Does not apply route daily. 06/26/16   Jearld Fenton, NP  Tiotropium Bromide-Olodaterol (STIOLTO RESPIMAT) 2.5-2.5 MCG/ACT AERS Inhale 1 puff into the lungs 2 (two) times daily. 07/08/18   Arnetha Courser, MD  valsartan (DIOVAN) 160 MG tablet TAKE 1 TABLET BY MOUTH EVERY DAY 08/25/18   Poulose, Bethel Born, NP    Family History Family History  Problem Relation Age of Onset  . Diabetes Father   . Hyperlipidemia Father   . Hypertension Father   . Cancer Maternal Aunt 57       leukemia - died  . Diabetes Maternal Grandmother   . Cancer Maternal Grandmother        breast cancer  . Stroke Maternal Grandmother   . Breast cancer Maternal  Grandmother   . Diabetes Daughter        Pre-diabetes  . Diabetes Maternal Grandfather   . Hyperlipidemia Maternal Grandfather   . Hypertension Maternal Grandfather   . Cancer Maternal Aunt 50       breast cancer - died  . Ovarian cancer Neg Hx   . Colon cancer Neg Hx     Social History Social History   Tobacco Use  . Smoking status: Never Smoker  . Smokeless tobacco: Never Used  Substance Use Topics  . Alcohol use: Yes    Alcohol/week: 0.0 standard drinks    Comment: occ  . Drug use: No     Allergies   Patient has no known allergies.   Review of Systems Review of Systems  Constitutional: Negative for chills and fever.  HENT: Negative for facial swelling and sore throat.   Respiratory: Positive for cough, shortness of breath and wheezing.   Cardiovascular: Negative for chest pain.  Gastrointestinal: Negative for abdominal pain, nausea and vomiting.  Genitourinary: Negative for dysuria.  Musculoskeletal: Negative for back pain.  Skin: Negative for rash and wound.  Neurological: Negative for headaches.  Psychiatric/Behavioral: The patient is not nervous/anxious.      Physical Exam Updated Vital Signs BP (!) 185/94   Pulse 87   Temp 98.3 F (36.8 C) (Oral)   Resp 20   Ht 5\' 4"  (1.626 m)   Wt 89.4 kg   SpO2 99%   BMI 33.81 kg/m   Physical Exam Vitals signs and nursing note reviewed.  Constitutional:      General: She is not in acute distress.    Appearance: She is well-developed. She is not diaphoretic.  HENT:     Head: Normocephalic and atraumatic.     Mouth/Throat:     Mouth: Mucous membranes are moist.     Pharynx: No oropharyngeal exudate or posterior oropharyngeal erythema.  Eyes:     General: No scleral icterus.       Right eye: No discharge.        Left eye: No discharge.     Conjunctiva/sclera: Conjunctivae normal.     Pupils: Pupils are equal, round, and reactive to light.  Neck:     Musculoskeletal: Normal range of motion and neck  supple.     Thyroid: No thyromegaly.  Cardiovascular:     Rate and Rhythm: Normal rate and regular rhythm.     Heart sounds: Normal heart sounds. No murmur. No friction rub. No gallop.   Pulmonary:     Effort: Respiratory distress present.     Breath sounds: No stridor. Wheezing and rales present.     Comments: 88% on RA Abdominal:     General: Bowel sounds are normal. There is no distension.     Palpations: Abdomen is soft.     Tenderness: There is no abdominal tenderness. There is no guarding or rebound.  Lymphadenopathy:     Cervical: No cervical adenopathy.  Skin:    General: Skin is warm and dry.     Coloration: Skin is not pale.     Findings: No rash.  Neurological:     Mental Status: She is alert.     Coordination: Coordination normal.      ED Treatments / Results  Labs (all labs ordered are listed, but only abnormal results are displayed) Labs Reviewed  CBC WITH DIFFERENTIAL/PLATELET - Abnormal; Notable for the following components:      Result Value   Hemoglobin 11.6 (*)    All other components within normal limits  COMPREHENSIVE METABOLIC PANEL - Abnormal; Notable for the following components:   Glucose, Bld 213 (*)    BUN 25 (*)    Creatinine, Ser 1.46 (*)    GFR calc non Af Amer 42 (*)    GFR calc Af Amer 48 (*)    All other components within normal limits  BRAIN NATRIURETIC PEPTIDE - Abnormal; Notable for the following components:   B Natriuretic Peptide 102.8 (*)  All other components within normal limits  URINALYSIS, ROUTINE W REFLEX MICROSCOPIC - Abnormal; Notable for the following components:   Color, Urine STRAW (*)    Glucose, UA 250 (*)    Hgb urine dipstick SMALL (*)    Protein, ur 100 (*)    All other components within normal limits  I-STAT CG4 LACTIC ACID, ED - Abnormal; Notable for the following components:   Lactic Acid, Venous 2.32 (*)    All other components within normal limits  I-STAT VENOUS BLOOD GAS, ED - Abnormal; Notable for the  following components:   pO2, Ven 27.0 (*)    Acid-base deficit 3.0 (*)    All other components within normal limits  CULTURE, BLOOD (ROUTINE X 2)  CULTURE, BLOOD (ROUTINE X 2)  TROPONIN I  RAPID URINE DRUG SCREEN, HOSP PERFORMED  URINALYSIS, MICROSCOPIC (REFLEX)    EKG EKG Interpretation  Date/Time:  Friday November 14 2018 21:39:46 EST Ventricular Rate:  96 PR Interval:    QRS Duration: 87 QT Interval:  367 QTC Calculation: 464 R Axis:   61 Text Interpretation:  Sinus rhythm Baseline wander in lead(s) II III aVR aVF V1 V2 V3 V6 Confirmed by Gerlene Fee [phone]) on 11/14/2018 11:03:23 PM   Radiology Dg Chest 2 View  Result Date: 11/14/2018 CLINICAL DATA:  48 year old female with shortness of breath, cough and hemoptysis for 3 hours. EXAM: CHEST - 2 VIEW COMPARISON:  CTA chest 09/17/2017 and earlier. FINDINGS: Confluent left greater than right lower lung opacity. Air bronchograms on the left. No pleural effusion or pneumothorax. Pulmonary vascularity remains normal. Normal cardiac size and mediastinal contours. Visualized tracheal air column is within normal limits. No acute osseous abnormality identified. Negative visible bowel gas pattern. IMPRESSION: Left greater than right lower lung consolidation which is nonspecific. Top considerations in this setting include pneumonia and pulmonary hemorrhage. No pleural effusion. Electronically Signed   By: Genevie Ann M.D.   On: 11/14/2018 22:34   Ct Angio Chest Pe W And/or Wo Contrast  Result Date: 11/14/2018 CLINICAL DATA:  48 year old female with cough and shortness of breath. EXAM: CT ANGIOGRAPHY CHEST WITH CONTRAST TECHNIQUE: Multidetector CT imaging of the chest was performed using the standard protocol during bolus administration of intravenous contrast. Multiplanar CT image reconstructions and MIPs were obtained to evaluate the vascular anatomy. CONTRAST:  193mL ISOVUE-370 IOPAMIDOL (ISOVUE-370) INJECTION 76% COMPARISON:  Chest radiograph  dated 11/14/2018 and CT dated 11/17/2016 FINDINGS: Cardiovascular: Borderline cardiomegaly. No pericardial effusion. The thoracic aorta is unremarkable. The origins of the great vessels of the aortic arch appear patent as visualized. There is no CT evidence of pulmonary embolism. Mediastinum/Nodes: No hilar or mediastinal adenopathy. Esophagus and the thyroid gland are grossly unremarkable. No mediastinal fluid collection. Lungs/Pleura: Diffuse airspace opacity may represent pulmonary edema, ARDS, or pneumonia. Clinical correlation is recommended. There is no pleural effusion or pneumothorax. The central airways are patent. Upper Abdomen: No acute abnormality. Musculoskeletal: No chest wall abnormality. No acute or significant osseous findings. Review of the MIP images confirms the above findings. IMPRESSION: 1. No CT evidence of pulmonary embolism. 2. Diffuse airspace opacity may represent pulmonary edema, ARDS, or pneumonia. Clinical correlation is recommended. Electronically Signed   By: Anner Crete M.D.   On: 11/14/2018 22:52    Procedures Procedures (including critical care time)  Medications Ordered in ED Medications  azithromycin (ZITHROMAX) 500 mg in sodium chloride 0.9 % 250 mL IVPB (500 mg Intravenous Transfusing/Transfer 11/15/18 0008)  azithromycin (ZITHROMAX) 500 MG injection (has no  administration in time range)  albuterol (PROVENTIL) (2.5 MG/3ML) 0.083% nebulizer solution 2.5 mg (2.5 mg Nebulization Given 11/14/18 2143)  ipratropium-albuterol (DUONEB) 0.5-2.5 (3) MG/3ML nebulizer solution 3 mL (3 mLs Nebulization Given 11/14/18 2143)  iopamidol (ISOVUE-370) 76 % injection 100 mL (100 mLs Intravenous Contrast Given 11/14/18 2200)  albuterol (PROVENTIL) (2.5 MG/3ML) 0.083% nebulizer solution 5 mg (5 mg Nebulization Given 11/14/18 2237)  sodium chloride 0.9 % bolus 1,000 mL (1,000 mLs Intravenous Transfusing/Transfer 11/15/18 0007)  cefTRIAXone (ROCEPHIN) 1 g in sodium chloride 0.9 % 100 mL  IVPB (1 g Intravenous Transfusing/Transfer 11/15/18 0007)  labetalol (NORMODYNE,TRANDATE) injection 10 mg (10 mg Intravenous Given 11/14/18 2335)     Initial Impression / Assessment and Plan / ED Course  I have reviewed the triage vital signs and the nursing notes.  Pertinent labs & imaging results that were available during my care of the patient were reviewed by me and considered in my medical decision making (see chart for details).     Patient presenting with sudden onset hemoptysis and shortness of breath.  She arrived satting 88% on room air.  Patient was feeling fine prior to this.  Considering sudden onset in the setting of sexual intercourse, there is concern for pulmonary hemorrhage as indicated on the chest x-ray as well as ARDS.  VBG shows pH 7.29, PCO2 47.9, PO2 27.0.  Lactic 2.32.  BNP 102.8.  CBC shows hemoglobin 11.6.  CMP shows BUN 25, creatinine 1.46.  Troponin negative.  CT angios chest shows no PE, but diffuse airspace opacity which may represent pulmonary edema, ARDS, or pneumonia.  I discussed this with Dr. Quintella Reichert, the radiologist, who advised this, as well as pulmonary hemorrhage, all looks the same on CT.  Antibiotics initiated including Rocephin and azithromycin to cover for pneumonia, however considering sudden onset, this is less likely.  Labetalol given for blood pressure which decreased in the ED.  Albuterol nebulizer given with some reduction in wheezing.  Patient will be transferred ED to ED to [facility] for better resources including bronchoscopy in case of decompensation.  Dr. Sedonia Small discussed patient case with Dr. Laverta Baltimore at [facility] who accepts the patient in transfer.  I also discussed patient case with Dr. Warrick Parisian with critical care who advised they could be contacted if needed after arrival to [facility].  Patient is feeling much improved prior to transfer via [facility].  She is breathing comfortably in no acute distress.  Patient also evaluated by my attending, Dr.  Sedonia Small, who guided the patient's management and agrees with plan.  Final Clinical Impressions(s) / ED Diagnoses   Final diagnoses:  Acute respiratory failure with hypoxia [facility])    ED Discharge Orders    None       Frederica Kuster, PA-C 11/15/18 0023    Maudie Flakes, MD 11/15/18 1544

## 2018-11-15 NOTE — ED Notes (Signed)
Admitting provider bedside 

## 2018-11-15 NOTE — Progress Notes (Signed)
RT set up BIPAP and placed on patient with continuous albuterol neb bled into circuit. Patient is tolerating well at this time. RN is aware. RT will monitor as needed.

## 2018-11-15 NOTE — Consult Note (Signed)
NAME:  Janice Jennings, MRN:  [id number], DOB:  September 13, 1971, LOS: 0 ADMISSION DATE:  11/14/2018, CONSULTATION DATE:  11/15/2018 REFERRING MD:  Dr. Shanon Brow, CHIEF COMPLAINT:  SOB/cough  Brief History   48 year old female with acute onset of SOB, cough with productive frothy pink sputum, hypoxia and hypertension- SBP 209.  CXR with L>R opacities.  Treated empirically with CAP coverage. On BiPAP with improvement.  CTA neg for PE but shows diffuse airspace opacity . PCCM consulted for unclear hypoxic respiratory failure.  History of present illness   48 year old female with history of non smoker, asthma, diabetes, hypertension, hyperlipidemia, and CKD [facility] with acute onset of hemoptysis and shortness of breath.  Patient works as Education officer, [employer].  States her asthma is well controlled, only flairs seasonally and uses albuterol inhaler as needed.  Was in her normal state of health and developed acute onset of shortness of breath and cough with productive pink frothy sputum which prompted her to go the ER.  Denies fever, chills, sick contacts, nausea, vomiting, visual changes, headaches, edema, joint pain or swelling, rash, personal hx or family hx of autoimmune disease, or recent travel.  States she is compliant with her meds. No anticoagulants, just 81mg  aspirin daily.  Has occasional nasal congestion.   In the ER, initial sats were 88% on room air with BP 209/103 and afebrile. Labs noted for glucose 213, BUN 25, sCr 1.46 (sCr 1.34 on 06/19/2018), BNP 102, neg troponin, EKG non acute, Hgb 11.6 (near baseline), lactic 2.32, VBG pH 7.29, PCO2 47.9, PO2 27.0, UDS neg. CXR with L>R consolidation.  CTA PE performed which was negative for PE but showed diffuse airspace opacity with ddx concerning for pneumonia, ARDS, pulmonary edema, or pulmonary hemorrhage.  She was started on empiric azithro and ceftriaxone, 1L NS bolus, albuterol, solumedrol, and supplemental O2. She was transferred to  [facility] ER.  Patient doing well on Aromas and in no distress but due to concern of pulmonary edema was started on BiPAP. Patient to be admitted to [facility] and PCCM consulted for further pulmonary input.   Past Medical History  Non smoker, Asthma- on prn albuterol, DM, hypertension, hyperlipidemia, CKD stage 3  Significant Hospital Events   1/17 Admitted   Consults:   Procedures:   Significant Diagnostic Tests:  1/17 CXR 2v >> Left greater than right lower lung consolidation which is nonspecific. Top considerations in this setting include pneumonia and pulmonary hemorrhage. No pleural effusion.  1/17 CTA PE >> 1. No CT evidence of pulmonary embolism. 2. Diffuse airspace opacity may represent pulmonary edema, ARDS, or pneumonia. Clinical correlation is recommended.  Micro Data:  1/18 Flu >>  Antimicrobials:  1/17 ceftriaxone >> 1/17 azithromycin >>  Interim history/subjective:   Objective   Blood pressure (!) 179/103, pulse 83, temperature 98.4 F (36.9 C), temperature source Oral, resp. rate (!) 24, height 5\' 4"  (1.626 m), weight 89.4 kg, SpO2 97 %.        Intake/Output Summary (Last 24 hours) at 11/15/2018 0325 Last data filed at 11/15/2018 0304 Gross per 24 hour  Intake 399.93 ml  Output -  Net 399.93 ml   Filed Weights   11/14/18 2130  Weight: 89.4 kg    Examination: General:  Well nourished female sitting in bed on room air in no distress.  Patient off BiPAP and just returned from bathroom HEENT: MM pink/moist Neuro: Awake, alert, non focal  CV: rrr, no m/r/g PULM: even/non-labored, on room  air at 96%, speaking full sentences without dyspnea, clear anteriorly, posterior rales from mid lung to bases GI: soft, non-tender, bs active  Extremities: warm/dry, no edema  Skin: no rashes   Resolved Hospital Problem list    Assessment & Plan:  Acute hypoxic respiratory insufficiency most likely related to acute pulmonary edema - acute onset with hypertension, pink frothy  sputum (minimal redness) per patient, and bilateral interstitial opacities on CT - ddx less likely infectious (afebrile, normal WBC, no URI/infectious symptoms), doubt pulmonary hemorrage and CTA neg for PE P:  Wean supplemental O2 for sat goal > 92% Continue BiPAP prn  Lasix 80mg  x 1 now w/ KCL 20 meq Repeat CXR in am  Strict I/O's w/daily wts  TTE already ordered  Flu pending Would not bronch at this time Agree with not continuing abx   Hypertensive Urgency  - no EKG changes - UDS neg P:  Lasix as above Labetalol prn primary team for goal SBP < 160 norvasc daily Trending troponin   Asthma - no wheezing at this time P:  Prn albuterol  No need to continue steroids at this time  Remainder per Primary team.  PCCM will continue to follow.   Best practice:  Diet: NPO Pain/Anxiety/Delirium protocol (if indicated): n/a VAP protocol (if indicated): n/a DVT prophylaxis: SCDs GI prophylaxis: n/a Glucose control: SSI Mobility:  Code Status: Full  Family Communication: Patient updated at bedside Disposition: progressive per [facility]  Labs   CBC: Recent Labs  Lab 11/14/18 2132  WBC 7.6  NEUTROABS 4.3  HGB 11.6*  HCT 36.9  MCV 93.9  PLT 400    Basic Metabolic Panel: Recent Labs  Lab 11/14/18 2132  NA 136  K 4.1  CL 106  CO2 23  GLUCOSE 213*  BUN 25*  CREATININE 1.46*  CALCIUM 9.2   GFR: Estimated Creatinine Clearance: 50.5 mL/min (A) (by C-G formula based on SCr of 1.46 mg/dL (H)). Recent Labs  Lab 11/14/18 2132 11/14/18 2318  WBC 7.6  --   LATICACIDVEN  --  2.32*    Liver Function Tests: Recent Labs  Lab 11/14/18 2132  AST 18  ALT 17  ALKPHOS 64  BILITOT 0.9  PROT 8.0  ALBUMIN 3.6   No results for input(s): LIPASE, AMYLASE in the last 168 hours. No results for input(s): AMMONIA in the last 168 hours.  ABG    Component Value Date/Time   HCO3 23.3 11/14/2018 2319   TCO2 25 11/14/2018 2319   ACIDBASEDEF 3.0 (H) 11/14/2018 2319   O2SAT 43.0  11/14/2018 2319     Coagulation Profile: No results for input(s): INR, PROTIME in the last 168 hours.  Cardiac Enzymes: Recent Labs  Lab 11/14/18 2132  TROPONINI <0.03    HbA1C: Hgb A1c MFr Bld  Date/Time Value Ref Range Status  06/19/2018 09:52 AM 6.6 (H) <5.7 % of total Hgb Final    Comment:    For someone without known diabetes, a hemoglobin A1c value of 6.5% or greater indicates that they may have  diabetes and this should be confirmed with a follow-up  test. . For someone with known diabetes, a value <7% indicates  that their diabetes is well controlled and a value  greater than or equal to 7% indicates suboptimal  control. A1c targets should be individualized based on  duration of diabetes, age, comorbid conditions, and  other considerations. . Currently, no consensus exists regarding use of hemoglobin A1c for diagnosis of diabetes for children. Marland Kitchen   03/04/2018  10:58 AM 7.2 (H) 4.8 - 5.6 % Final    Comment:             Prediabetes: 5.7 - 6.4          Diabetes: >6.4          Glycemic control for adults with diabetes: <7.0     CBG: No results for input(s): GLUCAP in the last 168 hours.  Review of Systems:   POSITIVES IN BOLD Gen: Denies fever, chills, weight change, fatigue, night sweats HEENT: Denies vision change, sinus congestion, rhinorrhea, sore throat, neck stiffness, dysphagia PULM: Denies shortness of breath, cough, sputum production- pink/frothy sputum, hemoptysis, wheezing CV: Denies chest pain w/coughing, edema, orthopnea, paroxysmal nocturnal dyspnea, palpitations GI: Denies abdominal pain, nausea, vomiting, diarrhea, hematochezia, melena, constipation, change in bowel habits GU: Denies dysuria, hematuria, polyuria, oliguria Derm: Denies rash, dry skin, scaling or peeling skin change Heme: Denies easy bruising, bleeding Neuro: Denies headache, weakness, slurred speech, loss of memory or consciousness  Past Medical History  She,  has a past  medical history of Asthma, Diabetes mellitus without complication ([place]), Diabetic retinopathy of both eyes associated with type 2 diabetes mellitus ([place]) (04/11/2018), Hyperlipidemia, Hypertension, and Migraine.   Surgical History    Past Surgical History:  Procedure Laterality Date  . BREAST REDUCTION SURGERY  2001  . CESAREAN SECTION    . COLONOSCOPY WITH PROPOFOL N/A 08/15/2018   Procedure: COLONOSCOPY WITH PROPOFOL;  Surgeon: Jonathon Bellows, MD;  Location: [facility] ENDOSCOPY;  Service: Gastroenterology;  Laterality: N/A;  . ESOPHAGOGASTRODUODENOSCOPY (EGD) WITH PROPOFOL N/A 08/15/2018   Procedure: ESOPHAGOGASTRODUODENOSCOPY (EGD) WITH PROPOFOL;  Surgeon: Jonathon Bellows, MD;  Location: [facility] ENDOSCOPY;  Service: Gastroenterology;  Laterality: N/A;  . GIVENS CAPSULE STUDY N/A 09/08/2018   Procedure: GIVENS CAPSULE STUDY;  Surgeon: Jonathon Bellows, MD;  Location: [facility] ENDOSCOPY;  Service: Gastroenterology;  Laterality: N/A;  . REDUCTION MAMMAPLASTY Bilateral 2000  . TUBAL LIGATION       Social History   reports that she has never smoked. She has never used smokeless tobacco. She reports current alcohol use. She reports that she does not use drugs.   Family History   Her family history includes Breast cancer in her maternal grandmother; Cancer in her maternal grandmother; Cancer (age of onset: 60) in her maternal aunt; Cancer (age of onset: 31) in her maternal aunt; Diabetes in her daughter, father, maternal grandfather, and maternal grandmother; Hyperlipidemia in her father and maternal grandfather; Hypertension in her father and maternal grandfather; Stroke in her maternal grandmother. There is no history of Ovarian cancer or Colon cancer.   Allergies No Known Allergies   Home Medications  Prior to Admission medications   Medication Sig Start Date End Date Taking? Authorizing Provider  albuterol (PROVENTIL HFA;VENTOLIN HFA) 108 (90 Base) MCG/ACT inhaler Inhale 2 puffs into the lungs every 4 (four)  hours as needed for wheezing or shortness of breath. 07/08/18   Arnetha Courser, MD  amLODipine (NORVASC) 5 MG tablet Take 1 tablet (5 mg total) by mouth daily. 11/13/18   Poulose, Bethel Born, NP  aspirin EC 81 MG tablet Take 81 mg by mouth daily.    [provider]  atorvastatin (LIPITOR) 80 MG tablet Take 1 tablet (80 mg total) by mouth daily. For cholesterol 09/04/18   Lada, Satira Anis, MD  Cholecalciferol (VITAMIN D3) 50000 units CAPS TAKE ONE CAPSULE BY MOUTH ONE TIME PER WEEK 07/12/18   [provider]  Cinnamon 500 MG capsule Take 500 mg by  mouth daily.    [provider]  Continuous Blood Gluc Receiver (FREESTYLE LIBRE READER) DEVI 1 each by Does not apply route 3 (three) times daily. Check sugars 3x a day; ICD 10 E11.319  LON 99 months 06/19/18   Arnetha Courser, MD  Continuous Blood Gluc Sensor (FREESTYLE LIBRE 14 DAY SENSOR) MISC 1 each by Does not apply route 3 (three) times daily. Check sugars 3x a day; ICD 10 E11.319  LON 99 months 06/19/18   Arnetha Courser, MD  ezetimibe (ZETIA) 10 MG tablet Take 1 tablet (10 mg total) by mouth daily. 09/22/18   Arnetha Courser, MD  ferrous sulfate 325 (65 FE) MG tablet Take 325 mg by mouth daily with breakfast.    [provider]  gabapentin (NEURONTIN) 300 MG capsule Take 1 capsule (300 mg total) by mouth 3 (three) times daily. 06/19/18   Arnetha Courser, MD  GARCINIA CAMBOGIA-CHROMIUM PO Take 1 capsule by mouth daily.    [provider]  glucose blood (ONE TOUCH ULTRA TEST) test strip Use to check blood sugar two times a day. 07/12/16   Jearld Fenton, NP  Insulin Glargine (BASAGLAR KWIKPEN) 100 UNIT/ML SOPN INJECT SUBCUTANEOUSLY 50  UNITS DAILY AT 10 PM 10/30/18   Lada, Satira Anis, MD  Insulin Pen Needle 31G X 5 MM MISC Use one pen needle two times daily. 11/13/18   Poulose, Bethel Born, NP  metFORMIN (GLUCOPHAGE) 1000 MG tablet Take 1 tablet (1,000 mg total) by mouth 2 (two) times daily with a meal. 06/19/18    Lada, Satira Anis, MD  metroNIDAZOLE (METROGEL) 0.75 % gel Apply 1 application topically 2 (two) times daily. 07/08/18   Arnetha Courser, MD  Multiple Vitamin (MULTIVITAMIN) tablet Take 1 tablet by mouth daily.    [provider]  [facility] DELICA LANCETS 15A MISC 1 each by Does not apply route daily. 06/26/16   Jearld Fenton, NP  Tiotropium Bromide-Olodaterol (STIOLTO RESPIMAT) 2.5-2.5 MCG/ACT AERS Inhale 1 puff into the lungs 2 (two) times daily. 07/08/18   Arnetha Courser, MD  valsartan (DIOVAN) 160 MG tablet TAKE 1 TABLET BY MOUTH EVERY DAY 08/25/18   Poulose, Bethel Born, NP        Kennieth Rad, MSN, AGACNP-BC [facility] Pgr: [phone] or if no answer [phone] 11/15/2018, 4:23 AM

## 2018-11-15 NOTE — H&P (Signed)
History and Physical    Rebekkah Powless OIN:[id number] DOB: 1971-03-22 DOA: 11/14/2018  PCP: Arnetha Courser, MD  Patient coming from: Home  Chief Complaint: Shortness of breath and cough no blood  HPI: Miray Mancino is a 48 y.o. female with medical history significant of hypertension, asthma, diabetes comes in tonight after sudden onset of acute shortness of breath with associated hemoptysis.  The hemoptysis quickly resolved start off with quite a bit and then tapered off to just sputum production.  She has not had any recent fevers.  She has been coughing since this started but has not been having coughing in the last couple days.  She denies any lower extremity edema or swelling.  She reports she has high blood pressure but is never as high as it is tonight.  She denies any chest pain.  Patient transferred here from urgent care because of hypoxia.  Patient be referred for admission for acute hypoxic respiratory failure currently on BiPAP and feeling better of unclear etiology.  Review of Systems: As per HPI otherwise 10 point review of systems negative.   Past Medical History:  Diagnosis Date  . Asthma   . Diabetes mellitus without complication ([place])   . Diabetic retinopathy of both eyes associated with type 2 diabetes mellitus ([place]) 04/11/2018   Moderate to severe, non-proliferative DR; referred to retinal specialist; April 10, 2018  . Hyperlipidemia   . Hypertension   . Migraine    Excedrin Tension HA OTC    Past Surgical History:  Procedure Laterality Date  . BREAST REDUCTION SURGERY  2001  . CESAREAN SECTION    . COLONOSCOPY WITH PROPOFOL N/A 08/15/2018   Procedure: COLONOSCOPY WITH PROPOFOL;  Surgeon: Jonathon Bellows, MD;  Location: [facility] ENDOSCOPY;  Service: Gastroenterology;  Laterality: N/A;  . ESOPHAGOGASTRODUODENOSCOPY (EGD) WITH PROPOFOL N/A 08/15/2018   Procedure: ESOPHAGOGASTRODUODENOSCOPY (EGD) WITH PROPOFOL;  Surgeon: Jonathon Bellows, MD;  Location: [facility] ENDOSCOPY;  Service:  Gastroenterology;  Laterality: N/A;  . GIVENS CAPSULE STUDY N/A 09/08/2018   Procedure: GIVENS CAPSULE STUDY;  Surgeon: Jonathon Bellows, MD;  Location: [facility] ENDOSCOPY;  Service: Gastroenterology;  Laterality: N/A;  . REDUCTION MAMMAPLASTY Bilateral 2000  . TUBAL LIGATION       reports that she has never smoked. She has never used smokeless tobacco. She reports current alcohol use. She reports that she does not use drugs.  No Known Allergies  Family History  Problem Relation Age of Onset  . Diabetes Father   . Hyperlipidemia Father   . Hypertension Father   . Cancer Maternal Aunt 3       leukemia - died  . Diabetes Maternal Grandmother   . Cancer Maternal Grandmother        breast cancer  . Stroke Maternal Grandmother   . Breast cancer Maternal Grandmother   . Diabetes Daughter        Pre-diabetes  . Diabetes Maternal Grandfather   . Hyperlipidemia Maternal Grandfather   . Hypertension Maternal Grandfather   . Cancer Maternal Aunt 50       breast cancer - died  . Ovarian cancer Neg Hx   . Colon cancer Neg Hx     Prior to Admission medications   Medication Sig Start Date End Date Taking? Authorizing Provider  albuterol (PROVENTIL HFA;VENTOLIN HFA) 108 (90 Base) MCG/ACT inhaler Inhale 2 puffs into the lungs every 4 (four) hours as needed for wheezing or shortness of breath. 07/08/18   Arnetha Courser, MD  amLODipine (NORVASC) 5 MG tablet  Take 1 tablet (5 mg total) by mouth daily. 11/13/18   Poulose, Bethel Born, NP  aspirin EC 81 MG tablet Take 81 mg by mouth daily.    [provider]  atorvastatin (LIPITOR) 80 MG tablet Take 1 tablet (80 mg total) by mouth daily. For cholesterol 09/04/18   Lada, Satira Anis, MD  Cholecalciferol (VITAMIN D3) 50000 units CAPS TAKE ONE CAPSULE BY MOUTH ONE TIME PER WEEK 07/12/18   [provider]  Cinnamon 500 MG capsule Take 500 mg by mouth daily.    [provider]  Continuous Blood Gluc Receiver (FREESTYLE LIBRE READER) DEVI  1 each by Does not apply route 3 (three) times daily. Check sugars 3x a day; ICD 10 E11.319  LON 99 months 06/19/18   Arnetha Courser, MD  Continuous Blood Gluc Sensor (FREESTYLE LIBRE 14 DAY SENSOR) MISC 1 each by Does not apply route 3 (three) times daily. Check sugars 3x a day; ICD 10 E11.319  LON 99 months 06/19/18   Arnetha Courser, MD  ezetimibe (ZETIA) 10 MG tablet Take 1 tablet (10 mg total) by mouth daily. 09/22/18   Arnetha Courser, MD  ferrous sulfate 325 (65 FE) MG tablet Take 325 mg by mouth daily with breakfast.    [provider]  gabapentin (NEURONTIN) 300 MG capsule Take 1 capsule (300 mg total) by mouth 3 (three) times daily. 06/19/18   Arnetha Courser, MD  GARCINIA CAMBOGIA-CHROMIUM PO Take 1 capsule by mouth daily.    [provider]  glucose blood (ONE TOUCH ULTRA TEST) test strip Use to check blood sugar two times a day. 07/12/16   Jearld Fenton, NP  Insulin Glargine (BASAGLAR KWIKPEN) 100 UNIT/ML SOPN INJECT SUBCUTANEOUSLY 50  UNITS DAILY AT 10 PM 10/30/18   Lada, Satira Anis, MD  Insulin Pen Needle 31G X 5 MM MISC Use one pen needle two times daily. 11/13/18   Poulose, Bethel Born, NP  metFORMIN (GLUCOPHAGE) 1000 MG tablet Take 1 tablet (1,000 mg total) by mouth 2 (two) times daily with a meal. 06/19/18   Lada, Satira Anis, MD  metroNIDAZOLE (METROGEL) 0.75 % gel Apply 1 application topically 2 (two) times daily. 07/08/18   Arnetha Courser, MD  Multiple Vitamin (MULTIVITAMIN) tablet Take 1 tablet by mouth daily.    [provider]  [facility] DELICA LANCETS 27P MISC 1 each by Does not apply route daily. 06/26/16   Jearld Fenton, NP  Tiotropium Bromide-Olodaterol (STIOLTO RESPIMAT) 2.5-2.5 MCG/ACT AERS Inhale 1 puff into the lungs 2 (two) times daily. 07/08/18   Arnetha Courser, MD  valsartan (DIOVAN) 160 MG tablet TAKE 1 TABLET BY MOUTH EVERY DAY 08/25/18   Fredderick Severance, NP    Physical Exam: Vitals:   11/15/18 0200 11/15/18 0215 11/15/18 0216 11/15/18  0300  BP:  (!) 194/95 (!) 194/95 (!) 179/103  Pulse:  84 84 83  Resp:  (!) 24 (!) 24 (!) 24  Temp:      TempSrc:      SpO2: 100% 100% 100% 97%  Weight:      Height:          Constitutional: NAD, calm, comfortable Vitals:   11/15/18 0200 11/15/18 0215 11/15/18 0216 11/15/18 0300  BP:  (!) 194/95 (!) 194/95 (!) 179/103  Pulse:  84 84 83  Resp:  (!) 24 (!) 24 (!) 24  Temp:      TempSrc:      SpO2: 100% 100% 100% 97%  Weight:      Height:       Eyes: PERRL, lids and conjunctivae normal ENMT: Mucous membranes are moist. Posterior pharynx clear of any exudate or lesions.Normal dentition.  Neck: normal, supple, no masses, no thyromegaly Respiratory: clear to auscultation bilaterally, no wheezing, no crackles. Normal respiratory effort. No accessory muscle use.  Cardiovascular: Regular rate and rhythm, no murmurs / rubs / gallops. No extremity edema. 2+ pedal pulses. No carotid bruits.  Abdomen: no tenderness, no masses palpated. No hepatosplenomegaly. Bowel sounds positive.  Musculoskeletal: no clubbing / cyanosis. No joint deformity upper and lower extremities. Good ROM, no contractures. Normal muscle tone.  Skin: no rashes, lesions, ulcers. No induration Neurologic: CN 2-12 grossly intact. Sensation intact, DTR normal. Strength 5/5 in all 4.  Psychiatric: Normal judgment and insight. Alert and oriented x 3. Normal mood.    Labs on Admission: I have personally reviewed following labs and imaging studies  CBC: Recent Labs  Lab 11/14/18 2132  WBC 7.6  NEUTROABS 4.3  HGB 11.6*  HCT 36.9  MCV 93.9  PLT 409   Basic Metabolic Panel: Recent Labs  Lab 11/14/18 2132  NA 136  K 4.1  CL 106  CO2 23  GLUCOSE 213*  BUN 25*  CREATININE 1.46*  CALCIUM 9.2   GFR: Estimated Creatinine Clearance: 50.5 mL/min (A) (by C-G formula based on SCr of 1.46 mg/dL (H)). Liver Function Tests: Recent Labs  Lab 11/14/18 2132  AST 18  ALT 17  ALKPHOS 64  BILITOT 0.9  PROT 8.0    ALBUMIN 3.6   No results for input(s): LIPASE, AMYLASE in the last 168 hours. No results for input(s): AMMONIA in the last 168 hours. Coagulation Profile: No results for input(s): INR, PROTIME in the last 168 hours. Cardiac Enzymes: Recent Labs  Lab 11/14/18 2132  TROPONINI <0.03   BNP (last 3 results) No results for input(s): PROBNP in the last 8760 hours. HbA1C: No results for input(s): HGBA1C in the last 72 hours. CBG: No results for input(s): GLUCAP in the last 168 hours. Lipid Profile: No results for input(s): CHOL, HDL, LDLCALC, TRIG, CHOLHDL, LDLDIRECT in the last 72 hours. Thyroid Function Tests: No results for input(s): TSH, T4TOTAL, FREET4, T3FREE, THYROIDAB in the last 72 hours. Anemia Panel: No results for input(s): VITAMINB12, FOLATE, FERRITIN, TIBC, IRON, RETICCTPCT in the last 72 hours. Urine analysis:    Component Value Date/Time   COLORURINE STRAW (A) 11/14/2018 2337   APPEARANCEUR CLEAR 11/14/2018 2337   APPEARANCEUR Clear 07/30/2018 1201   LABSPEC 1.010 11/14/2018 2337   LABSPEC 1.016 02/14/2013 1416   PHURINE 6.5 11/14/2018 2337   GLUCOSEU 250 (A) 11/14/2018 2337   GLUCOSEU >=500 02/14/2013 1416   HGBUR SMALL (A) 11/14/2018 2337   BILIRUBINUR NEGATIVE 11/14/2018 2337   BILIRUBINUR Negative 07/30/2018 1201   BILIRUBINUR Negative 02/14/2013 1416   KETONESUR NEGATIVE 11/14/2018 2337   PROTEINUR 100 (A) 11/14/2018 2337   UROBILINOGEN 1.0 10/06/2010 1632   NITRITE NEGATIVE 11/14/2018 2337   LEUKOCYTESUR NEGATIVE 11/14/2018 2337   LEUKOCYTESUR Negative 07/30/2018 1201   LEUKOCYTESUR 1+ 02/14/2013 1416   Sepsis Labs: !!!!!!!!!!!!!!!!!!!!!!!!!!!!!!!!!!!!!!!!!!!! @LABRCNTIP (procalcitonin:4,lacticidven:4) )No results found for this or any previous visit (from the past 240 hour(s)).   Radiological Exams on Admission: Dg Chest 2 View  Result Date: 11/14/2018 CLINICAL DATA:  48 year old female with shortness of breath, cough and hemoptysis for 3 hours.  EXAM: CHEST - 2 VIEW COMPARISON:  CTA chest 09/17/2017 and earlier. FINDINGS: Confluent left greater than right lower  lung opacity. Air bronchograms on the left. No pleural effusion or pneumothorax. Pulmonary vascularity remains normal. Normal cardiac size and mediastinal contours. Visualized tracheal air column is within normal limits. No acute osseous abnormality identified. Negative visible bowel gas pattern. IMPRESSION: Left greater than right lower lung consolidation which is nonspecific. Top considerations in this setting include pneumonia and pulmonary hemorrhage. No pleural effusion. Electronically Signed   By: Genevie Ann M.D.   On: 11/14/2018 22:34   Ct Angio Chest Pe W And/or Wo Contrast  Result Date: 11/14/2018 CLINICAL DATA:  48 year old female with cough and shortness of breath. EXAM: CT ANGIOGRAPHY CHEST WITH CONTRAST TECHNIQUE: Multidetector CT imaging of the chest was performed using the standard protocol during bolus administration of intravenous contrast. Multiplanar CT image reconstructions and MIPs were obtained to evaluate the vascular anatomy. CONTRAST:  134mL ISOVUE-370 IOPAMIDOL (ISOVUE-370) INJECTION 76% COMPARISON:  Chest radiograph dated 11/14/2018 and CT dated 11/17/2016 FINDINGS: Cardiovascular: Borderline cardiomegaly. No pericardial effusion. The thoracic aorta is unremarkable. The origins of the great vessels of the aortic arch appear patent as visualized. There is no CT evidence of pulmonary embolism. Mediastinum/Nodes: No hilar or mediastinal adenopathy. Esophagus and the thyroid gland are grossly unremarkable. No mediastinal fluid collection. Lungs/Pleura: Diffuse airspace opacity may represent pulmonary edema, ARDS, or pneumonia. Clinical correlation is recommended. There is no pleural effusion or pneumothorax. The central airways are patent. Upper Abdomen: No acute abnormality. Musculoskeletal: No chest wall abnormality. No acute or significant osseous findings. Review of the  MIP images confirms the above findings. IMPRESSION: 1. No CT evidence of pulmonary embolism. 2. Diffuse airspace opacity may represent pulmonary edema, ARDS, or pneumonia. Clinical correlation is recommended. Electronically Signed   By: Anner Crete M.D.   On: 11/14/2018 22:52    EKG: Independently reviewed.  Normal sinus rhythm no acute changes Old chart reviewed Case discussed with PA in the ED Margarita Mail Chest x-ray reviewed bilateral patchy lung infiltrates  Assessment/Plan 48 year old female with acute hypoxic respiratory failure and hemoptysis caused by unclear etiology  Principal Problem:   Hemoptysis-history is more consistent with flash pulmonary edema.  She has no fever white count is normal.  She is currently on BiPAP and doing well.  She is been given IV Solu-Medrol along with antibiotics in the ED.  We will check a quick flu but she is not had any symptoms of influenza lately.  Will obtain pulmonary consultation.  Her hemoptysis seems to have stopped.  Will treat for flash pulmonary edema.  Differential is edema versus ARDS versus infiltrates.  CT is negative for PE and reports is diffuse airspace opacity.  Active Problems:   Malignant hypertension with likely flash pulmonary edema-we will give a dose of Lasix 40 mg IV once.  Obtain cardiac echo in the morning.  Await pulmonary input.  Provide labetalol IV as needed for systolic blood pressures over 160.    DM type 2 with diabetic peripheral neuropathy (HCC)-sliding scale insulin    Asthma-lungs clear at this time.  Empirically given steroids and antibiotics prior to arrival.  Will not continue at this time unless suggested by pulmonary service   Med rec pending pharmacy review  DVT prophylaxis: SCDs  code Status: Full Family Communication: None Disposition Plan: Days Consults called: PCCM Admission status: Admission   Annaliah Rivenbark A MD [facility]  If 7PM-7AM, please contact  night-coverage [email or web address] Password [facility]  11/15/2018, 3:25 AM

## 2018-11-15 NOTE — Progress Notes (Signed)
  Echocardiogram 2D Echocardiogram has been performed.  Janice Jennings 11/15/2018, 1:50 PM

## 2018-11-15 NOTE — ED Notes (Signed)
Pt off oxygen while using bedside commode, saturations back at 89-90% on room air

## 2018-11-15 NOTE — Progress Notes (Signed)
Echo called, spoke with Candace and informed of pt anticipated discharge wijh MD request for echo today.

## 2018-11-15 NOTE — Procedures (Signed)
Came bedside for echo, but patient needs time to use restroom.

## 2018-11-15 NOTE — Progress Notes (Signed)
NAME:  Janice Jennings, MRN:  [id number], DOB:  February 23, 1971, LOS: 0 ADMISSION DATE:  11/14/2018, CONSULTATION DATE:  11/15/2018 REFERRING MD:  Dr. Shanon Brow, CHIEF COMPLAINT:  SOB/cough  Brief History   48 year old female with acute onset of SOB, cough with productive frothy pink sputum, hypoxia and hypertension- SBP 209.  CXR with L>R opacities.  Treated empirically with CAP coverage. On BiPAP with improvement.  CTA neg for PE but shows diffuse airspace opacity . PCCM consulted for unclear hypoxic respiratory failure.  History of present illness   48 year old female with history of non smoker, asthma, diabetes, hypertension, hyperlipidemia, and CKD presenting to [place] ED with acute onset of hemoptysis and shortness of breath.  Patient works as Education officer, [employer].  States her asthma is well controlled, only flairs seasonally and uses albuterol inhaler as needed.  Was in her normal state of health and developed acute onset of shortness of breath and cough with productive pink frothy sputum which prompted her to go the ER.  Denies fever, chills, sick contacts, nausea, vomiting, visual changes, headaches, edema, joint pain or swelling, rash, personal hx or family hx of autoimmune disease, or recent travel.  States she is compliant with her meds. No anticoagulants, just 81mg  aspirin daily.  Has occasional nasal congestion.   In the ER, initial sats were 88% on room air with BP 209/103 and afebrile. Labs noted for glucose 213, BUN 25, sCr 1.46 (sCr 1.34 on 06/19/2018), BNP 102, neg troponin, EKG non acute, Hgb 11.6 (near baseline), lactic 2.32, VBG pH 7.29, PCO2 47.9, PO2 27.0, UDS neg. CXR with L>R consolidation.  CTA PE performed which was negative for PE but showed diffuse airspace opacity with ddx concerning for pneumonia, ARDS, pulmonary edema, or pulmonary hemorrhage.  She was started on empiric azithro and ceftriaxone, 1L NS bolus, albuterol, solumedrol, and supplemental O2. She was transferred to  [facility] ER.  Patient doing well on [place] and in no distress but due to concern of pulmonary edema was started on BiPAP. Patient to be admitted to [facility] and PCCM consulted for further pulmonary input.   Past Medical History  Non smoker, Asthma- on prn albuterol, DM, hypertension, hyperlipidemia, CKD stage 3  Significant Hospital Events   1/17 Admitted   Consults:   Procedures:   Significant Diagnostic Tests:  1/17 CXR 2v >> Left greater than right lower lung consolidation which is nonspecific. Top considerations in this setting include pneumonia and pulmonary hemorrhage. No pleural effusion.  1/17 CTA PE >> negative PE 1. No CT evidence of pulmonary embolism. 2. Diffuse airspace opacity may represent pulmonary edema, ARDS, or pneumonia. Clinical correlation is recommended.  Micro Data:  1/18 Flu >>  Antimicrobials:  1/17 ceftriaxone >> 1/17 azithromycin >>  Interim history/subjective:   Objective   Blood pressure (!) 158/90, pulse 80, temperature 97.6 F (36.4 C), temperature source Oral, resp. rate 20, height 5\' 4"  (1.626 m), weight 93.4 kg, SpO2 96 %.        Intake/Output Summary (Last 24 hours) at 11/15/2018 1126 Last data filed at 11/15/2018 0946 Gross per 24 hour  Intake 1402.93 ml  Output 1000 ml  Net 402.93 ml   Filed Weights   11/14/18 2130 11/15/18 0544  Weight: 89.4 kg 93.4 kg    Examination: General: Obese female no acute distress.  Off oxygen.  Ambulating about room.  Not requiring any pulmonary support HEENT: JVD or lymphadenopathy is appreciated Neuro: Appears intact CV: s1s2 rrr, no m/r/g PULM:  even/non-labored, lungs bilaterally creased in the bases QA:STMH, non-tender, bsx4 active  Extremities: warm/dry, 1+ edema  Skin: no rashes or lesions   Resolved Hospital Problem list    Assessment & Plan:  Acute hypoxic respiratory insufficiency most likely related to acute pulmonary edema - acute onset with hypertension, pink frothy sputum (minimal redness)  per patient, and bilateral interstitial opacities on CT.  Responded well to diuresis - ddx less likely infectious (afebrile, normal WBC, no URI/infectious symptoms), doubt pulmonary hemorrage and CTA neg for PE P:  Currently off oxygen Not required BiPAP Responding well to diuresis  Hypertensive Urgency  -Resolving per [facility]ad P:  Diuresis as tolerated .  PRN labetalol Managed by [facility] pulmonary critical care will sign off  Asthma  no wheezing at this time Not requiring oxygen Not requiring BiPAP P:  .  Bronchodilators -No need for steroid  Remainder per Primary team.  Pulmonary critical care will sign off at this time feel free to call us back if needed.  Best practice:  Diet: Diet advancement per triad Pain/Anxiety/Delirium protocol (if indicated): n/a VAP protocol (if indicated): n/a DVT prophylaxis: SCDs GI prophylaxis: n/a Glucose control: SSI Mobility:  Code Status: Full  Family Communication: 11/15/2018 mother and patient updated at bedside Disposition: progressive per [facility]  Labs   CBC: Recent Labs  Lab 11/14/18 2132 11/15/18 0446  WBC 7.6 8.1  NEUTROABS 4.3  --   HGB 11.6* 10.4*  HCT 36.9 31.9*  MCV 93.9 94.4  PLT 305 962    Basic Metabolic Panel: Recent Labs  Lab 11/14/18 2132 11/15/18 0446  NA 136 137  K 4.1 4.6  CL 106 108  CO2 23 19*  GLUCOSE 213* 276*  BUN 25* 20  CREATININE 1.46* 1.52*  CALCIUM 9.2 8.7*   GFR: Estimated Creatinine Clearance: 49.6 mL/min (A) (by C-G formula based on SCr of 1.52 mg/dL (H)). Recent Labs  Lab 11/14/18 2132 11/14/18 2318 11/15/18 0446  WBC 7.6  --  8.1  LATICACIDVEN  --  2.32*  --     Liver Function Tests: Recent Labs  Lab 11/14/18 2132  AST 18  ALT 17  ALKPHOS 64  BILITOT 0.9  PROT 8.0  ALBUMIN 3.6   No results for input(s): LIPASE, AMYLASE in the last 168 hours. No results for input(s): AMMONIA in the last 168 hours.  ABG    Component Value Date/Time   HCO3 23.3 11/14/2018 2319   TCO2  25 11/14/2018 2319   ACIDBASEDEF 3.0 (H) 11/14/2018 2319   O2SAT 43.0 11/14/2018 2319     Coagulation Profile: No results for input(s): INR, PROTIME in the last 168 hours.  Cardiac Enzymes: Recent Labs  Lab 11/14/18 2132 11/15/18 0446 11/15/18 1042  TROPONINI <0.03 <0.03 <0.03    HbA1C: Hgb A1c MFr Bld  Date/Time Value Ref Range Status  06/19/2018 09:52 AM 6.6 (H) <5.7 % of total Hgb Final    Comment:    For someone without known diabetes, a hemoglobin A1c value of 6.5% or greater indicates that they may have  diabetes and this should be confirmed with a follow-up  test. . For someone with known diabetes, a value <7% indicates  that their diabetes is well controlled and a value  greater than or equal to 7% indicates suboptimal  control. A1c targets should be individualized based on  duration of diabetes, age, comorbid conditions, and  other considerations. . Currently, no consensus exists regarding use of hemoglobin A1c for diagnosis of diabetes for children. Marland Kitchen  03/04/2018 10:58 AM 7.2 (H) 4.8 - 5.6 % Final    Comment:             Prediabetes: 5.7 - 6.4          Diabetes: >6.4          Glycemic control for adults with diabetes: <7.0     CBG: Recent Labs  Lab 11/15/18 0733  GLUCAP 334*    11/15/2018 pulmonary critical care will sign off at this time.  Richardson Landry Kemuel Buchmann ACNP Maryanna Shape PCCM Pager [phone] till 1 pm If no answer page [phone] 11/15/2018, 11:26 AM

## 2018-11-15 NOTE — Progress Notes (Signed)
PROGRESS NOTE                                                                                                                                                                                                             Patient Demographics:    Janice Jennings, is a 48 y.o. female, DOB - 03-29-1971, IZT:[id number]  Admit date - 11/14/2018   Admitting Physician Phillips Grout, MD  Outpatient Primary MD for the patient is Janice Jennings, Janice Anis, MD  LOS - 0   Chief Complaint  Patient presents with  . Cough       Brief Narrative    48 year old female with history of diabetes, hypertension, CKD and asthma who presents with acute onset shortness of breath with cough productive of frothy pinkish sputum.  On presentation found to have elevated blood pressure in the 382N systolics.  She reports compliance to her blood pressure medications.  Required BiPAP initially  Subjective:    [name] today has, No headache, No chest pain, No abdominal pain, dyspnea has improved   Assessment  & Plan :    Principal Problem:   Hemoptysis Active Problems:   DM type 2 with diabetic peripheral neuropathy (HCC)   Malignant hypertension   Asthma  Hypoxic respiratory insufficiency -in the setting of flash pulmonary edema from hypertensive urgency -He did require BiPAP initially, but clinically improved after IV Lasix and better blood pressure control -Encouraged use incentive spirometry -Hemoptysis most likely in the setting of flash pulmonary edema, not real hemoptysis, CTA chest with no evidence of PE, no recurrence -Follow on 2D echo  Hypertensive urgency -Patient with known hypertension at baseline, but by reviewing from her history apparently has been consistently uncontrolled, her medicine regimen has been adjusted, I have upped her Norvasc to 10 mg oral daily, continue with irbesartan, blood pressure remains significantly uncontrolled, so I have started on  hydralazine 25 every 6 hours, despite that remains elevated, she did require 2 IV pushes of labetalol this morning, and IV hydralazine, so I have upped her hydralazine to 50 every 8 hours, will uptitrate as needed, and will avoid drop by more than 25% of her blood pressure, I would add low-dose of Aldactone as well   Diabetes mellitus, type II, insulin-dependent, uncontrolled -CBG significantly elevated this morning, this is likely as she did not receive  her evening dose Lantus, she will be resumed on it, continue with insulin sliding scale   Code Status : Full code  Family Communication  : Mother at bedside  Disposition Plan  : Home when stable  Barriers For Discharge : Blood pressure remains significantly uncontrolled, still requiring multiple PRN IV pushes, and her hypertensive regimen actively adjusted, as well her CBG uncontrolled  Consults  :  PCCM  Procedures  : none  DVT Prophylaxis  :  scd  Lab Results  Component Value Date   PLT 258 11/15/2018    Antibiotics  :    Anti-infectives (From admission, onward)   Start     Dose/Rate Route Frequency Ordered Stop   11/14/18 2319  azithromycin (ZITHROMAX) 500 MG injection    Note to Pharmacy:  Marsa Aris   : cabinet override      11/14/18 2319 11/15/18 1129   11/14/18 2315  cefTRIAXone (ROCEPHIN) 1 g in sodium chloride 0.9 % 100 mL IVPB     1 g 200 mL/hr over 30 Minutes Intravenous  Once 11/14/18 2305 11/15/18 0101   11/14/18 2315  azithromycin (ZITHROMAX) 500 mg in sodium chloride 0.9 % 250 mL IVPB     500 mg 250 mL/hr over 60 Minutes Intravenous  Once 11/14/18 2305 11/15/18 0101        Objective:   Vitals:   11/15/18 0732 11/15/18 0740 11/15/18 1140 11/15/18 1300  BP: (!) 158/90  (!) 202/113 (!) 188/96  Pulse: 80  95   Resp: 20  19   Temp: 97.6 F (36.4 C)  98 F (36.7 C)   TempSrc: Axillary  Axillary   SpO2: 100% 96% 96%   Weight:      Height:        Wt Readings from Last 3 Encounters:  11/15/18 93.4 kg   09/22/18 90.9 kg  07/30/18 91.3 kg     Intake/Output Summary (Last 24 hours) at 11/15/2018 1411 Last data filed at 11/15/2018 0946 Gross per 24 hour  Intake 1402.93 ml  Output 1000 ml  Net 402.93 ml     Physical Exam  Awake Alert, Oriented X 3, No new F.N deficits, Normal affect Symmetrical Chest wall movement, Good air movement bilaterally, CTAB RRR,No Gallops,Rubs or new Murmurs, No Parasternal Heave +ve B.Sounds, Abd Soft, No tenderness, No rebound - guarding or rigidity. No Cyanosis, Clubbing or edema, No new Rash or bruise      Data Review:    CBC Recent Labs  Lab 11/14/18 2132 11/15/18 0446  WBC 7.6 8.1  HGB 11.6* 10.4*  HCT 36.9 31.9*  PLT 305 258  MCV 93.9 94.4  MCH 29.5 30.8  MCHC 31.4 32.6  RDW 12.7 12.9  LYMPHSABS 2.3  --   MONOABS 0.5  --   EOSABS 0.4  --   BASOSABS 0.1  --     Chemistries  Recent Labs  Lab 11/14/18 2132 11/15/18 0446  NA 136 137  K 4.1 4.6  CL 106 108  CO2 23 19*  GLUCOSE 213* 276*  BUN 25* 20  CREATININE 1.46* 1.52*  CALCIUM 9.2 8.7*  AST 18  --   ALT 17  --   ALKPHOS 64  --   BILITOT 0.9  --    ------------------------------------------------------------------------------------------------------------------ No results for input(s): CHOL, HDL, LDLCALC, TRIG, CHOLHDL, LDLDIRECT in the last 72 hours.  Lab Results  Component Value Date   HGBA1C 6.6 (H) 06/19/2018   ------------------------------------------------------------------------------------------------------------------ No results for input(s): TSH, T4TOTAL, T3FREE, THYROIDAB  in the last 72 hours.  Invalid input(s): FREET3 ------------------------------------------------------------------------------------------------------------------ No results for input(s): VITAMINB12, FOLATE, FERRITIN, TIBC, IRON, RETICCTPCT in the last 72 hours.  Coagulation profile No results for input(s): INR, PROTIME in the last 168 hours.  No results for input(s): DDIMER in  the last 72 hours.  Cardiac Enzymes Recent Labs  Lab 11/14/18 2132 11/15/18 0446 11/15/18 1042  TROPONINI <0.03 <0.03 <0.03   ------------------------------------------------------------------------------------------------------------------    Component Value Date/Time   BNP 102.8 (H) 11/14/2018 2310    Inpatient Medications  Scheduled Meds: . amLODipine  10 mg Oral Daily  . atorvastatin  80 mg Oral Daily  . ezetimibe  10 mg Oral Daily  . gabapentin  300 mg Oral TID  . hydrALAZINE  50 mg Oral Q8H  . insulin aspart  0-9 Units Subcutaneous TID WC  . insulin glargine  45 Units Subcutaneous Daily  . irbesartan  150 mg Oral Daily  . potassium chloride  20 mEq Oral Once  . sodium chloride flush  3 mL Intravenous Q12H   Continuous Infusions: . sodium chloride     PRN Meds:.sodium chloride, hydrALAZINE, labetalol, sodium chloride flush  Micro Results No results found for this or any previous visit (from the past 240 hour(s)).  Radiology Reports Dg Chest 2 View  Result Date: 11/14/2018 CLINICAL DATA:  48 year old female with shortness of breath, cough and hemoptysis for 3 hours. EXAM: CHEST - 2 VIEW COMPARISON:  CTA chest 09/17/2017 and earlier. FINDINGS: Confluent left greater than right lower lung opacity. Air bronchograms on the left. No pleural effusion or pneumothorax. Pulmonary vascularity remains normal. Normal cardiac size and mediastinal contours. Visualized tracheal air column is within normal limits. No acute osseous abnormality identified. Negative visible bowel gas pattern. IMPRESSION: Left greater than right lower lung consolidation which is nonspecific. Top considerations in this setting include pneumonia and pulmonary hemorrhage. No pleural effusion. Electronically Signed   By: Genevie Ann M.D.   On: 11/14/2018 22:34   Ct Angio Chest Pe W And/or Wo Contrast  Result Date: 11/14/2018 CLINICAL DATA:  48 year old female with cough and shortness of breath. EXAM: CT  ANGIOGRAPHY CHEST WITH CONTRAST TECHNIQUE: Multidetector CT imaging of the chest was performed using the standard protocol during bolus administration of intravenous contrast. Multiplanar CT image reconstructions and MIPs were obtained to evaluate the vascular anatomy. CONTRAST:  165mL ISOVUE-370 IOPAMIDOL (ISOVUE-370) INJECTION 76% COMPARISON:  Chest radiograph dated 11/14/2018 and CT dated 11/17/2016 FINDINGS: Cardiovascular: Borderline cardiomegaly. No pericardial effusion. The thoracic aorta is unremarkable. The origins of the great vessels of the aortic arch appear patent as visualized. There is no CT evidence of pulmonary embolism. Mediastinum/Nodes: No hilar or mediastinal adenopathy. Esophagus and the thyroid gland are grossly unremarkable. No mediastinal fluid collection. Lungs/Pleura: Diffuse airspace opacity may represent pulmonary edema, ARDS, or pneumonia. Clinical correlation is recommended. There is no pleural effusion or pneumothorax. The central airways are patent. Upper Abdomen: No acute abnormality. Musculoskeletal: No chest wall abnormality. No acute or significant osseous findings. Review of the MIP images confirms the above findings. IMPRESSION: 1. No CT evidence of pulmonary embolism. 2. Diffuse airspace opacity may represent pulmonary edema, ARDS, or pneumonia. Clinical correlation is recommended. Electronically Signed   By: Anner Crete M.D.   On: 11/14/2018 22:52     Phillips Climes M.D on 11/15/2018 at 2:11 PM  Between 7am to 7pm - Pager - [phone]  After 7pm go to [email or web address] - password [facility]  [facility] -  Office  [phone]

## 2018-11-15 NOTE — ED Notes (Signed)
Per [facility], pt from [facility] w/ a c/o a cough w/ bloody sputum. CAOx4. She required 3 lpm via [place]. O2 sats dropped during w/o O2. Pt transferred to [facility] for further eval and admission. Pt completed Rocephin and Zithromax tx during transport.

## 2018-11-16 ENCOUNTER — Inpatient Hospital Stay (HOSPITAL_COMMUNITY): Payer: 59

## 2018-11-16 DIAGNOSIS — N179 Acute kidney failure, unspecified: Secondary | ICD-10-CM

## 2018-11-16 LAB — BASIC METABOLIC PANEL
ANION GAP: 11 (ref 5–15)
BUN: 34 mg/dL — ABNORMAL HIGH (ref 6–20)
CO2: 22 mmol/L (ref 22–32)
Calcium: 9.2 mg/dL (ref 8.9–10.3)
Chloride: 104 mmol/L (ref 98–111)
Creatinine, Ser: 2.14 mg/dL — ABNORMAL HIGH (ref 0.44–1.00)
GFR calc Af Amer: 31 mL/min — ABNORMAL LOW (ref >60)
GFR calc non Af Amer: 26 mL/min — ABNORMAL LOW (ref >60)
Glucose, Bld: 207 mg/dL — ABNORMAL HIGH (ref 70–99)
POTASSIUM: 3.9 mmol/L (ref 3.5–5.1)
Sodium: 137 mmol/L (ref 135–145)

## 2018-11-16 LAB — GLUCOSE, CAPILLARY
GLUCOSE-CAPILLARY: 256 mg/dL — AB (ref 70–99)
Glucose-Capillary: 197 mg/dL — ABNORMAL HIGH (ref 70–99)
Glucose-Capillary: 309 mg/dL — ABNORMAL HIGH (ref 70–99)
Glucose-Capillary: 155 mg/dL — ABNORMAL HIGH (ref 70–99)

## 2018-11-16 LAB — HEMOGLOBIN A1C
HEMOGLOBIN A1C: 8.1 % — AB (ref 4.8–5.6)
Mean Plasma Glucose: 185.77 mg/dL

## 2018-11-16 LAB — SODIUM, URINE, RANDOM: Sodium, Ur: 46 mmol/L

## 2018-11-16 LAB — CREATININE, URINE, RANDOM: Creatinine, Urine: 91.07 mg/dL

## 2018-11-16 MED ORDER — HYDRALAZINE HCL 50 MG PO TABS
50.0000 mg | ORAL_TABLET | Freq: Four times a day (QID) | ORAL | Status: DC
  Administered 2018-11-16 – 2018-11-17 (×4): 50 mg via ORAL
  Filled 2018-11-16 (×4): qty 1

## 2018-11-16 MED ORDER — METOPROLOL TARTRATE 25 MG PO TABS
25.0000 mg | ORAL_TABLET | Freq: Two times a day (BID) | ORAL | Status: DC
  Administered 2018-11-16 – 2018-11-18 (×5): 25 mg via ORAL
  Filled 2018-11-16 (×5): qty 1

## 2018-11-16 NOTE — Progress Notes (Signed)
This note also relates to the following rows which could not be included: Pulse Rate - Cannot attach notes to unvalidated device data Resp - Cannot attach notes to unvalidated device data SpO2 - Cannot attach notes to unvalidated device data  Bipap not indicated at this time. RT will monitor.

## 2018-11-16 NOTE — Progress Notes (Signed)
PROGRESS NOTE                                                                                                                                                                                                             Patient Demographics:    Janice Jennings, is a 48 y.o. female, DOB - October 18, 1971, XTG:[id number]  Admit date - 11/14/2018   Admitting Physician Phillips Grout, MD  Outpatient Primary MD for the patient is Lada, Satira Anis, MD  LOS - 1   Chief Complaint  Patient presents with  . Cough       Brief Narrative    48 year old female with history of diabetes, hypertension, CKD and asthma who presents with acute onset shortness of breath with cough productive of frothy pinkish sputum.  On presentation found to have elevated blood pressure in the 270J systolics.  She reports compliance to her blood pressure medications.  Required BiPAP initially, back on room air after diuresis and blood pressure control, Creatinine increased to 2.1 today,.  Subjective:    [name] today for some headache, but no shortness of breath or chest pain   Assessment  & Plan :    Principal Problem:   Hemoptysis Active Problems:   DM type 2 with diabetic peripheral neuropathy (HCC)   Malignant hypertension   Asthma   Acute respiratory failure with hypoxia (HCC)  Hypoxic respiratory insufficiency -in the setting of flash pulmonary edema from hypertensive urgency -He did require BiPAP initially, but clinically improved after IV Lasix and better blood pressure control -Encouraged use incentive spirometry -Hemoptysis most likely in the setting of flash pulmonary edema, not real hemoptysis, CTA chest with no evidence of PE, no recurrence -Resolved, currently on room air  Hypertensive urgency -Patient with known hypertension at baseline, but by reviewing from her history apparently has been consistently uncontrolled. -Blood pressure uncontrolled during  hospital stay, and Norvasc was increased from 5 to 10 mg oral daily, she was PRN labetalol and hydralazine as well, she required few pushes yesterday, started on hydralazine yesterday, I will increase today pressure remains uncontrolled, and mainly as I am stopping her irbesartan in the setting of a KI, I did start on low-dose metoprolol as well.  AKI on CKD stage III -Plan creatinine 1.3, was 1.5 on admission, it did increase to 2.1 today, this is  most likely in the setting of baseline chronic kidney disease from diabetes and hypertension, IV diuresis and hypertensive urgency, will hold irbesartan, hold nephrotoxic medication, no further diuresis, check renal ultrasound, urine sodium and creatinine. -she Received contrast 11/14/2018,.  Diabetes mellitus, type II, insulin-dependent, uncontrolled -ABG better controlled after she was resumed at her home dose Lantus, continue with insulin sliding scale, will check A1c .  Code Status : Full code  Family Communication  : Mother at bedside  Disposition Plan  : Home when stable  Barriers For Discharge : Blood pressure remains uncontrolled, will need further management, currently with AKI as well .  Consults  :  PCCM  Procedures  : none  DVT Prophylaxis  :  scd  Lab Results  Component Value Date   PLT 258 11/15/2018    Antibiotics  :    Anti-infectives (From admission, onward)   Start     Dose/Rate Route Frequency Ordered Stop   11/14/18 2319  azithromycin (ZITHROMAX) 500 MG injection    Note to Pharmacy:  Marsa Aris   : cabinet override      11/14/18 2319 11/15/18 1129   11/14/18 2315  cefTRIAXone (ROCEPHIN) 1 g in sodium chloride 0.9 % 100 mL IVPB     1 g 200 mL/hr over 30 Minutes Intravenous  Once 11/14/18 2305 11/15/18 0101   11/14/18 2315  azithromycin (ZITHROMAX) 500 mg in sodium chloride 0.9 % 250 mL IVPB     500 mg 250 mL/hr over 60 Minutes Intravenous  Once 11/14/18 2305 11/15/18 0101        Objective:   Vitals:    11/16/18 0623 11/16/18 0733 11/16/18 0747 11/16/18 0939  BP: (!) 180/95  (!) 154/82 (!) 175/98  Pulse:   86 75  Resp:   11 18  Temp:    98.7 F (37.1 C)  TempSrc:    Oral  SpO2:   98% 100%  Weight:  91.8 kg    Height:        Wt Readings from Last 3 Encounters:  11/16/18 91.8 kg  09/22/18 90.9 kg  07/30/18 91.3 kg     Intake/Output Summary (Last 24 hours) at 11/16/2018 1105 Last data filed at 11/16/2018 1104 Gross per 24 hour  Intake 360 ml  Output 650 ml  Net -290 ml     Physical Exam  Awake Alert, Oriented X 3, No new F.N deficits, Normal affect Symmetrical Chest wall movement, Good air movement bilaterally, CTAB RRR,No Gallops,Rubs or new Murmurs, No Parasternal Heave +ve B.Sounds, Abd Soft, No tenderness, No rebound - guarding or rigidity. No Cyanosis, Clubbing or edema, No new Rash or bruise       Data Review:    CBC Recent Labs  Lab 11/14/18 2132 11/15/18 0446  WBC 7.6 8.1  HGB 11.6* 10.4*  HCT 36.9 31.9*  PLT 305 258  MCV 93.9 94.4  MCH 29.5 30.8  MCHC 31.4 32.6  RDW 12.7 12.9  LYMPHSABS 2.3  --   MONOABS 0.5  --   EOSABS 0.4  --   BASOSABS 0.1  --     Chemistries  Recent Labs  Lab 11/14/18 2132 11/15/18 0446 11/16/18 0335  NA 136 137 137  K 4.1 4.6 3.9  CL 106 108 104  CO2 23 19* 22  GLUCOSE 213* 276* 207*  BUN 25* 20 34*  CREATININE 1.46* 1.52* 2.14*  CALCIUM 9.2 8.7* 9.2  AST 18  --   --   ALT 17  --   --  ALKPHOS 64  --   --   BILITOT 0.9  --   --    ------------------------------------------------------------------------------------------------------------------ No results for input(s): CHOL, HDL, LDLCALC, TRIG, CHOLHDL, LDLDIRECT in the last 72 hours.  Lab Results  Component Value Date   HGBA1C 6.6 (H) 06/19/2018   ------------------------------------------------------------------------------------------------------------------ No results for input(s): TSH, T4TOTAL, T3FREE, THYROIDAB in the last 72 hours.  Invalid  input(s): FREET3 ------------------------------------------------------------------------------------------------------------------ No results for input(s): VITAMINB12, FOLATE, FERRITIN, TIBC, IRON, RETICCTPCT in the last 72 hours.  Coagulation profile No results for input(s): INR, PROTIME in the last 168 hours.  No results for input(s): DDIMER in the last 72 hours.  Cardiac Enzymes Recent Labs  Lab 11/15/18 0446 11/15/18 1042 11/15/18 1603  TROPONINI <0.03 <0.03 <0.03   ------------------------------------------------------------------------------------------------------------------    Component Value Date/Time   BNP 102.8 (H) 11/14/2018 2310    Inpatient Medications  Scheduled Meds: . amLODipine  10 mg Oral Daily  . atorvastatin  80 mg Oral Daily  . ezetimibe  10 mg Oral Daily  . gabapentin  300 mg Oral TID  . hydrALAZINE  50 mg Oral Q8H  . insulin aspart  0-9 Units Subcutaneous TID WC  . insulin glargine  45 Units Subcutaneous Daily  . metoprolol tartrate  25 mg Oral BID  . potassium chloride  20 mEq Oral Once  . sodium chloride flush  3 mL Intravenous Q12H   Continuous Infusions: . sodium chloride     PRN Meds:.sodium chloride, acetaminophen, hydrALAZINE, labetalol, menthol-cetylpyridinium, sodium chloride flush  Micro Results Recent Results (from the past 240 hour(s))  MRSA PCR Screening     Status: None   Collection Time: 11/15/18  5:55 AM  Result Value Ref Range Status   MRSA by PCR NEGATIVE NEGATIVE Final    Comment:        The GeneXpert MRSA Assay (FDA approved for NASAL specimens only), is one component of a comprehensive MRSA colonization surveillance program. It is not intended to diagnose MRSA infection nor to guide or monitor treatment for MRSA infections. Performed at [facility], [place] [address] [place], [place] [address]     Radiology Reports Dg Chest 2 View  Result Date: 11/14/2018 CLINICAL DATA:  48 year old female with  shortness of breath, cough and hemoptysis for 3 hours. EXAM: CHEST - 2 VIEW COMPARISON:  CTA chest 09/17/2017 and earlier. FINDINGS: Confluent left greater than right lower lung opacity. Air bronchograms on the left. No pleural effusion or pneumothorax. Pulmonary vascularity remains normal. Normal cardiac size and mediastinal contours. Visualized tracheal air column is within normal limits. No acute osseous abnormality identified. Negative visible bowel gas pattern. IMPRESSION: Left greater than right lower lung consolidation which is nonspecific. Top considerations in this setting include pneumonia and pulmonary hemorrhage. No pleural effusion. Electronically Signed   By: Genevie Ann M.D.   On: 11/14/2018 22:34   Ct Angio Chest Pe W And/or Wo Contrast  Result Date: 11/14/2018 CLINICAL DATA:  48 year old female with cough and shortness of breath. EXAM: CT ANGIOGRAPHY CHEST WITH CONTRAST TECHNIQUE: Multidetector CT imaging of the chest was performed using the standard protocol during bolus administration of intravenous contrast. Multiplanar CT image reconstructions and MIPs were obtained to evaluate the vascular anatomy. CONTRAST:  131mL ISOVUE-370 IOPAMIDOL (ISOVUE-370) INJECTION 76% COMPARISON:  Chest radiograph dated 11/14/2018 and CT dated 11/17/2016 FINDINGS: Cardiovascular: Borderline cardiomegaly. No pericardial effusion. The thoracic aorta is unremarkable. The origins of the great vessels of the aortic arch appear patent as visualized. There is no CT  evidence of pulmonary embolism. Mediastinum/Nodes: No hilar or mediastinal adenopathy. Esophagus and the thyroid gland are grossly unremarkable. No mediastinal fluid collection. Lungs/Pleura: Diffuse airspace opacity may represent pulmonary edema, ARDS, or pneumonia. Clinical correlation is recommended. There is no pleural effusion or pneumothorax. The central airways are patent. Upper Abdomen: No acute abnormality. Musculoskeletal: No chest wall abnormality. No  acute or significant osseous findings. Review of the MIP images confirms the above findings. IMPRESSION: 1. No CT evidence of pulmonary embolism. 2. Diffuse airspace opacity may represent pulmonary edema, ARDS, or pneumonia. Clinical correlation is recommended. Electronically Signed   By: Anner Crete M.D.   On: 11/14/2018 22:52   US Renal  Result Date: 11/16/2018 CLINICAL DATA:  Renal insufficiency EXAM: RENAL / URINARY TRACT ULTRASOUND COMPLETE COMPARISON:  October 14, 2018 FINDINGS: Right Kidney: Renal measurements: 11.4 x 4.6 x 6.3 cm = volume: 171.6 mL . Echogenicity and renal cortical thickness are within normal limits. No mass, perinephric fluid, or hydronephrosis visualized. No sonographically demonstrable calculus or ureterectasis. Left Kidney: Renal measurements: 10.4 x 5.0 x 5.9 cm = volume: 161.6 mL. Echogenicity and renal cortical thickness are within normal limits. No mass, perinephric fluid, or hydronephrosis visualized. No sonographically demonstrable calculus or ureterectasis. Bladder: Appears normal for degree of bladder distention. IMPRESSION: Study within normal limits. Electronically Signed   By: Lowella Grip III M.D.   On: 11/16/2018 09:05     Phillips Climes M.D on 11/16/2018 at 11:05 AM  Between 7am to 7pm - Pager - [phone]  After 7pm go to [email or web address] - password [facility]  [facility] -  Office  [phone]

## 2018-11-17 LAB — BASIC METABOLIC PANEL
Anion gap: 9 (ref 5–15)
Anion gap: 8 (ref 5–15)
BUN: 38 mg/dL — ABNORMAL HIGH (ref 6–20)
BUN: 40 mg/dL — ABNORMAL HIGH (ref 6–20)
CO2: 22 mmol/L (ref 22–32)
CO2: 20 mmol/L — ABNORMAL LOW (ref 22–32)
CREATININE: 2.28 mg/dL — AB (ref 0.44–1.00)
Calcium: 8.5 mg/dL — ABNORMAL LOW (ref 8.9–10.3)
Calcium: 8.3 mg/dL — ABNORMAL LOW (ref 8.9–10.3)
Chloride: 106 mmol/L (ref 98–111)
Chloride: 107 mmol/L (ref 98–111)
Creatinine, Ser: 2.2 mg/dL — ABNORMAL HIGH (ref 0.44–1.00)
GFR calc Af Amer: 30 mL/min — ABNORMAL LOW (ref >60)
GFR calc Af Amer: 28 mL/min — ABNORMAL LOW (ref >60)
GFR calc non Af Amer: 25 mL/min — ABNORMAL LOW (ref >60)
GFR calc non Af Amer: 24 mL/min — ABNORMAL LOW (ref >60)
Glucose, Bld: 126 mg/dL — ABNORMAL HIGH (ref 70–99)
Glucose, Bld: 229 mg/dL — ABNORMAL HIGH (ref 70–99)
POTASSIUM: 4.5 mmol/L (ref 3.5–5.1)
Potassium: 4.2 mmol/L (ref 3.5–5.1)
SODIUM: 135 mmol/L (ref 135–145)
Sodium: 137 mmol/L (ref 135–145)

## 2018-11-17 LAB — GLUCOSE, CAPILLARY
GLUCOSE-CAPILLARY: 143 mg/dL — AB (ref 70–99)
Glucose-Capillary: 101 mg/dL — ABNORMAL HIGH (ref 70–99)
Glucose-Capillary: 298 mg/dL — ABNORMAL HIGH (ref 70–99)
Glucose-Capillary: 188 mg/dL — ABNORMAL HIGH (ref 70–99)

## 2018-11-17 MED ORDER — HYDRALAZINE HCL 50 MG PO TABS
100.0000 mg | ORAL_TABLET | Freq: Three times a day (TID) | ORAL | Status: DC
  Administered 2018-11-17 – 2018-11-18 (×3): 100 mg via ORAL
  Filled 2018-11-17 (×3): qty 2

## 2018-11-17 MED ORDER — SODIUM CHLORIDE 0.9 % IV SOLN
INTRAVENOUS | Status: DC
  Administered 2018-11-17: 10:00:00 via INTRAVENOUS

## 2018-11-17 MED ORDER — HEPARIN SODIUM (PORCINE) 5000 UNIT/ML IJ SOLN
5000.0000 [IU] | Freq: Three times a day (TID) | INTRAMUSCULAR | Status: DC
  Administered 2018-11-17 – 2018-11-18 (×2): 5000 [IU] via SUBCUTANEOUS
  Filled 2018-11-17 (×2): qty 1

## 2018-11-17 MED ORDER — INSULIN ASPART 100 UNIT/ML ~~LOC~~ SOLN
4.0000 [IU] | Freq: Three times a day (TID) | SUBCUTANEOUS | Status: DC
  Administered 2018-11-18 (×2): 4 [IU] via SUBCUTANEOUS

## 2018-11-17 MED ORDER — INSULIN GLARGINE 100 UNIT/ML ~~LOC~~ SOLN
50.0000 [IU] | Freq: Every day | SUBCUTANEOUS | Status: DC
  Administered 2018-11-18: 50 [IU] via SUBCUTANEOUS
  Filled 2018-11-17: qty 0.5

## 2018-11-17 NOTE — Plan of Care (Deleted)
Patient progressing 

## 2018-11-17 NOTE — Progress Notes (Signed)
PROGRESS NOTE                                                                                                                                                                                                             Patient Demographics:    Janice Jennings, is a 48 y.o. female, DOB - 13-Jul-1971, JKK:[id number]  Admit date - 11/14/2018   Admitting Physician Phillips Grout, MD  Outpatient Primary MD for the patient is Lada, Satira Anis, MD  LOS - 2   Chief Complaint  Patient presents with  . Cough       Brief Narrative    49 year old female with history of diabetes, hypertension, CKD and asthma who presents with acute onset shortness of breath with cough productive of frothy pinkish sputum.  On presentation found to have elevated blood pressure in the 716R systolics.  She reports compliance to her blood pressure medications.  Required BiPAP initially, back on room air after diuresis and blood pressure control,   Subjective:    [name] today denies any complaints   Assessment  & Plan :    Principal Problem:   Hemoptysis Active Problems:   DM type 2 with diabetic peripheral neuropathy (HCC)   Malignant hypertension   Asthma   Acute respiratory failure with hypoxia (HCC)  Hypoxic respiratory insufficiency -in the setting of flash pulmonary edema from hypertensive urgency -He did require BiPAP initially, but clinically improved after IV Lasix and better blood pressure control -Encouraged use incentive spirometry -Hemoptysis most likely in the setting of flash pulmonary edema, not real hemoptysis, CTA chest with no evidence of PE, no recurrence -Resolved, currently on room air  Hypertensive urgency -Patient with known hypertension at baseline, but by reviewing from her history apparently has been consistently uncontrolled. -Blood pressure remains uncontrolled, currently she is on Norvasc 10 mg oral daily, 25 mg twice daily, I have  increased her hydralazine 200 mg oral 3 times daily , in the setting of AKI I will hold on initiating Aldactone . -Continue with PRN labetalol and hydralazine  AKI on CKD stage III -Plan creatinine 1.3, was 1.5 on admission, did increase to 2.2 today, most likely in the setting of contrast nephropathy, especially with urine sodium of 46, and Fina of 0.8, discussed with renal, would like see her creatinine trending down before able to  discharge . -Continue to hold irbesartan and nephrotoxic medication  -Renal ultrasound with no acute findings    Diabetes mellitus, type II, insulin-dependent, uncontrolled -A1c is 8.1, continue with home dose Lantus, continue with insulin sliding scale, started on pre-meal insulin 3 units of NovoLog   .  Code Status : Full code  Family Communication  : discussed with mother via phone  Disposition Plan  : Home when stable  Barriers For Discharge : Blood pressure remains uncontrolled, will need further management, currently with AKI as well .  Consults  :  PCCM  Procedures  : none  DVT Prophylaxis  :  scd, subcu heparin  Lab Results  Component Value Date   PLT 258 11/15/2018    Antibiotics  :    Anti-infectives (From admission, onward)   Start     Dose/Rate Route Frequency Ordered Stop   11/14/18 2319  azithromycin (ZITHROMAX) 500 MG injection    Note to Pharmacy:  Marsa Aris   : cabinet override      11/14/18 2319 11/15/18 1129   11/14/18 2315  cefTRIAXone (ROCEPHIN) 1 g in sodium chloride 0.9 % 100 mL IVPB     1 g 200 mL/hr over 30 Minutes Intravenous  Once 11/14/18 2305 11/15/18 0101   11/14/18 2315  azithromycin (ZITHROMAX) 500 mg in sodium chloride 0.9 % 250 mL IVPB     500 mg 250 mL/hr over 60 Minutes Intravenous  Once 11/14/18 2305 11/15/18 0101        Objective:   Vitals:   11/17/18 0617 11/17/18 0832 11/17/18 1228 11/17/18 1718  BP: (!) 170/78 (!) 163/93 (!) 163/86 (!) 177/85  Pulse:  85 86 86  Resp:  16 18 16   Temp:  97.7  F (36.5 C) 98.1 F (36.7 C) 98.4 F (36.9 C)  TempSrc:  Oral Oral Oral  SpO2:  100% 98% 97%  Weight:      Height:        Wt Readings from Last 3 Encounters:  11/17/18 93.2 kg  09/22/18 90.9 kg  07/30/18 91.3 kg     Intake/Output Summary (Last 24 hours) at 11/17/2018 1719 Last data filed at 11/16/2018 1859 Gross per 24 hour  Intake -  Output 300 ml  Net -300 ml     Physical Exam  Awake Alert, Oriented X 3, No new F.N deficits, Normal affect Symmetrical Chest wall movement, Good air movement bilaterally, CTAB RRR,No Gallops,Rubs or new Murmurs, No Parasternal Heave +ve B.Sounds, Abd Soft, No tenderness, No rebound - guarding or rigidity. No Cyanosis, Clubbing or edema, No new Rash or bruise        Data Review:    CBC Recent Labs  Lab 11/14/18 2132 11/15/18 0446  WBC 7.6 8.1  HGB 11.6* 10.4*  HCT 36.9 31.9*  PLT 305 258  MCV 93.9 94.4  MCH 29.5 30.8  MCHC 31.4 32.6  RDW 12.7 12.9  LYMPHSABS 2.3  --   MONOABS 0.5  --   EOSABS 0.4  --   BASOSABS 0.1  --     Chemistries  Recent Labs  Lab 11/14/18 2132 11/15/18 0446 11/16/18 0335 11/17/18 0223 11/17/18 1529  NA 136 137 137 137 135  K 4.1 4.6 3.9 4.5 4.2  CL 106 108 104 106 107  CO2 23 19* 22 22 20*  GLUCOSE 213* 276* 207* 126* 229*  BUN 25* 20 34* 38* 40*  CREATININE 1.46* 1.52* 2.14* 2.20* 2.28*  CALCIUM 9.2 8.7* 9.2 8.5* 8.3*  AST  18  --   --   --   --   ALT 17  --   --   --   --   ALKPHOS 64  --   --   --   --   BILITOT 0.9  --   --   --   --    ------------------------------------------------------------------------------------------------------------------ No results for input(s): CHOL, HDL, LDLCALC, TRIG, CHOLHDL, LDLDIRECT in the last 72 hours.  Lab Results  Component Value Date   HGBA1C 8.1 (H) 11/16/2018   ------------------------------------------------------------------------------------------------------------------ No results for input(s): TSH, T4TOTAL, T3FREE, THYROIDAB in  the last 72 hours.  Invalid input(s): FREET3 ------------------------------------------------------------------------------------------------------------------ No results for input(s): VITAMINB12, FOLATE, FERRITIN, TIBC, IRON, RETICCTPCT in the last 72 hours.  Coagulation profile No results for input(s): INR, PROTIME in the last 168 hours.  No results for input(s): DDIMER in the last 72 hours.  Cardiac Enzymes Recent Labs  Lab 11/15/18 0446 11/15/18 1042 11/15/18 1603  TROPONINI <0.03 <0.03 <0.03   ------------------------------------------------------------------------------------------------------------------    Component Value Date/Time   BNP 102.8 (H) 11/14/2018 2310    Inpatient Medications  Scheduled Meds: . amLODipine  10 mg Oral Daily  . atorvastatin  80 mg Oral Daily  . ezetimibe  10 mg Oral Daily  . gabapentin  300 mg Oral TID  . hydrALAZINE  100 mg Oral TID  . insulin aspart  0-9 Units Subcutaneous TID WC  . [START ON 11/18/2018] insulin aspart  4 Units Subcutaneous TID WC  . [START ON 11/18/2018] insulin glargine  50 Units Subcutaneous Daily  . metoprolol tartrate  25 mg Oral BID  . potassium chloride  20 mEq Oral Once  . sodium chloride flush  3 mL Intravenous Q12H   Continuous Infusions: . sodium chloride     PRN Meds:.sodium chloride, acetaminophen, hydrALAZINE, labetalol, menthol-cetylpyridinium, sodium chloride flush  Micro Results Recent Results (from the past 240 hour(s))  Culture, blood (routine x 2)     Status: None (Preliminary result)   Collection Time: 11/14/18 11:10 PM  Result Value Ref Range Status   Specimen Description   Final    BLOOD RIGHT ANTECUBITAL Performed at [facility], [address] [place], [place] [address]    Special Requests   Final    BOTTLES DRAWN AEROBIC AND ANAEROBIC Blood Culture adequate volume Performed at [facility], [address] [place], [place] [address]    Culture   Final    NO  GROWTH 2 DAYS Performed at [facility], [place] [address], [place], [place] [address]    Report Status PENDING  Incomplete  MRSA PCR Screening     Status: None   Collection Time: 11/15/18  5:55 AM  Result Value Ref Range Status   MRSA by PCR NEGATIVE NEGATIVE Final    Comment:        The GeneXpert MRSA Assay (FDA approved for NASAL specimens only), is one component of a comprehensive MRSA colonization surveillance program. It is not intended to diagnose MRSA infection nor to guide or monitor treatment for MRSA infections. Performed at [facility], [place] [address] [place], [place] [address]     Radiology Reports Dg Chest 2 View  Result Date: 11/14/2018 CLINICAL DATA:  48 year old female with shortness of breath, cough and hemoptysis for 3 hours. EXAM: CHEST - 2 VIEW COMPARISON:  CTA chest 09/17/2017 and earlier. FINDINGS: Confluent left greater than right lower lung opacity. Air bronchograms on the left. No pleural  effusion or pneumothorax. Pulmonary vascularity remains normal. Normal cardiac size and mediastinal contours. Visualized tracheal air column is within normal limits. No acute osseous abnormality identified. Negative visible bowel gas pattern. IMPRESSION: Left greater than right lower lung consolidation which is nonspecific. Top considerations in this setting include pneumonia and pulmonary hemorrhage. No pleural effusion. Electronically Signed   By: Genevie Ann M.D.   On: 11/14/2018 22:34   Ct Angio Chest Pe W And/or Wo Contrast  Result Date: 11/14/2018 CLINICAL DATA:  48 year old female with cough and shortness of breath. EXAM: CT ANGIOGRAPHY CHEST WITH CONTRAST TECHNIQUE: Multidetector CT imaging of the chest was performed using the standard protocol during bolus administration of intravenous contrast. Multiplanar CT image reconstructions and MIPs were obtained to evaluate the vascular anatomy. CONTRAST:  129mL ISOVUE-370 IOPAMIDOL (ISOVUE-370) INJECTION 76%  COMPARISON:  Chest radiograph dated 11/14/2018 and CT dated 11/17/2016 FINDINGS: Cardiovascular: Borderline cardiomegaly. No pericardial effusion. The thoracic aorta is unremarkable. The origins of the great vessels of the aortic arch appear patent as visualized. There is no CT evidence of pulmonary embolism. Mediastinum/Nodes: No hilar or mediastinal adenopathy. Esophagus and the thyroid gland are grossly unremarkable. No mediastinal fluid collection. Lungs/Pleura: Diffuse airspace opacity may represent pulmonary edema, ARDS, or pneumonia. Clinical correlation is recommended. There is no pleural effusion or pneumothorax. The central airways are patent. Upper Abdomen: No acute abnormality. Musculoskeletal: No chest wall abnormality. No acute or significant osseous findings. Review of the MIP images confirms the above findings. IMPRESSION: 1. No CT evidence of pulmonary embolism. 2. Diffuse airspace opacity may represent pulmonary edema, ARDS, or pneumonia. Clinical correlation is recommended. Electronically Signed   By: Anner Crete M.D.   On: 11/14/2018 22:52   US Renal  Result Date: 11/16/2018 CLINICAL DATA:  Renal insufficiency EXAM: RENAL / URINARY TRACT ULTRASOUND COMPLETE COMPARISON:  October 14, 2018 FINDINGS: Right Kidney: Renal measurements: 11.4 x 4.6 x 6.3 cm = volume: 171.6 mL . Echogenicity and renal cortical thickness are within normal limits. No mass, perinephric fluid, or hydronephrosis visualized. No sonographically demonstrable calculus or ureterectasis. Left Kidney: Renal measurements: 10.4 x 5.0 x 5.9 cm = volume: 161.6 mL. Echogenicity and renal cortical thickness are within normal limits. No mass, perinephric fluid, or hydronephrosis visualized. No sonographically demonstrable calculus or ureterectasis. Bladder: Appears normal for degree of bladder distention. IMPRESSION: Study within normal limits. Electronically Signed   By: Lowella Grip III M.D.   On: 11/16/2018 09:05      Phillips Climes M.D on 11/17/2018 at 5:19 PM  Between 7am to 7pm - Pager - [phone]  After 7pm go to [email or web address] - password [facility]  [facility] -  Office  [phone]

## 2018-11-17 NOTE — Progress Notes (Signed)
Inpatient Diabetes Program Recommendations  AACE/ADA: New Consensus Statement on Inpatient Glycemic Control (2015)  Target Ranges:  Prepandial:   less than 140 mg/dL      Peak postprandial:   less than 180 mg/dL (1-2 hours)      Critically ill patients:  140 - 180 mg/dL   Lab Results  Component Value Date   GLUCAP 298 (H) 11/17/2018   HGBA1C 8.1 (H) 11/16/2018   Results for JENESA, FORESTA (MRN [id number]) as of 11/17/2018 16:39  Ref. Range 11/16/2018 07:37 11/16/2018 11:44 11/16/2018 16:09 11/16/2018 21:06 11/17/2018 08:30 11/17/2018 12:03  Glucose-Capillary Latest Ref Range: 70 - 99 mg/dL 197 (H)  Novolog 2 units  256 (H)  Novolog 5 units 309 (H)  Novolog 7 units  155 (H) 101 (H) 298 (H)  Novolog 5 units     DM 2  Home DM meds: Glucophage 1000 mg BID/ Lantus 50 units hs  Current DM meds: Novolog sensitive correction (0-9 units) tid / Lantus 45 units daily    Patients CBG remaining elevated about inpatient goals. Recommend adding Novolog meal coverage tid.   MD please consider the following inpatient glycemic control recommendations:  1. Add Novolog 4 units meal coverage tid (Hold if NPO or if patient eats less than 50% of meal.) Based on 0.05 units/kg  2. Add "carb modified" to patient's diet   Thank you.  -- Will follow during hospitalization.--  Jonna Clark RN, MSN Diabetes Coordinator Inpatient Glycemic Control Team Team Pager: [phone] (8am-5pm)'

## 2018-11-18 LAB — BASIC METABOLIC PANEL
Anion gap: 7 (ref 5–15)
BUN: 41 mg/dL — ABNORMAL HIGH (ref 6–20)
CO2: 21 mmol/L — ABNORMAL LOW (ref 22–32)
CREATININE: 2.1 mg/dL — AB (ref 0.44–1.00)
Calcium: 8.5 mg/dL — ABNORMAL LOW (ref 8.9–10.3)
Chloride: 108 mmol/L (ref 98–111)
GFR calc Af Amer: 31 mL/min — ABNORMAL LOW (ref >60)
GFR calc non Af Amer: 27 mL/min — ABNORMAL LOW (ref >60)
Glucose, Bld: 214 mg/dL — ABNORMAL HIGH (ref 70–99)
Potassium: 4.4 mmol/L (ref 3.5–5.1)
Sodium: 136 mmol/L (ref 135–145)

## 2018-11-18 LAB — GLUCOSE, CAPILLARY
Glucose-Capillary: 120 mg/dL — ABNORMAL HIGH (ref 70–99)
Glucose-Capillary: 138 mg/dL — ABNORMAL HIGH (ref 70–99)

## 2018-11-18 MED ORDER — HYDRALAZINE HCL 100 MG PO TABS: 100.0000 mg | ORAL_TABLET | Freq: Three times a day (TID) | ORAL | 0 refills | Status: DC

## 2018-11-18 MED ORDER — METOPROLOL TARTRATE 25 MG PO TABS: 25.0000 mg | ORAL_TABLET | Freq: Two times a day (BID) | ORAL | 0 refills | Status: DC

## 2018-11-18 MED ORDER — AMLODIPINE BESYLATE 10 MG PO TABS: 10.0000 mg | ORAL_TABLET | Freq: Every day | ORAL | 1 refills | Status: DC

## 2018-11-18 MED ORDER — BASAGLAR KWIKPEN 100 UNIT/ML ~~LOC~~ SOPN: 55.0000 [IU] | PEN_INJECTOR | Freq: Every day | SUBCUTANEOUS | 1 refills | Status: DC

## 2018-11-18 MED FILL — AMLODIPINE BESYLATE 10 MG T: 10 | 30 days supply | Qty: 30 | Fill #0 | Status: TO

## 2018-11-18 MED FILL — hydrALAZINE HCL 100 MG TABS: 100 | 30 days supply | Qty: 90 | Fill #0

## 2018-11-18 MED FILL — METOPROLOL TARTRATE 25 MG T: 25 | 30 days supply | Qty: 60 | Fill #0

## 2018-11-18 NOTE — Discharge Instructions (Signed)
Follow with Primary MD Lada, Satira Anis, MD in 7 days   Get CBC, CMP,  checked  by Primary MD next visit.    Activity: As tolerated with Full fall precautions use walker/cane & assistance as needed   Disposition Home    Diet: Heart Healthy , low salt, carbohydrate modified, with feeding assistance and aspiration precautions.  For Heart failure patients - Check your Weight same time everyday, if you gain over 2 pounds, or you develop in leg swelling, experience more shortness of breath or chest pain, call your Primary MD immediately. Follow Cardiac Low Salt Diet and 1.5 lit/day fluid restriction.   On your next visit with your primary care physician please Get Medicines reviewed and adjusted.   Please request your Prim.MD to go over all Hospital Tests and Procedure/Radiological results at the follow up, please get all Hospital records sent to your Prim MD by signing hospital release before you go home.   If you experience worsening of your admission symptoms, develop shortness of breath, life threatening emergency, suicidal or homicidal thoughts you must seek medical attention immediately by calling 911 or calling your MD immediately  if symptoms less severe.  You Must read complete instructions/literature along with all the possible adverse reactions/side effects for all the Medicines you take and that have been prescribed to you. Take any new Medicines after you have completely understood and accpet all the possible adverse reactions/side effects.   Do not drive, operating heavy machinery, perform activities at heights, swimming or participation in water activities or provide baby sitting services if your were admitted for syncope or siezures until you have seen by Primary MD or a Neurologist and advised to do so again.  Do not drive when taking Pain medications.    Do not take more than prescribed Pain, Sleep and Anxiety Medications  Special Instructions: If you have smoked or  chewed Tobacco  in the last 2 yrs please stop smoking, stop any regular Alcohol  and or any Recreational drug use.  Wear Seat belts while driving.   Please note  You were cared for by a hospitalist during your hospital stay. If you have any questions about your discharge medications or the care you received while you were in the hospital after you are discharged, you can call the unit and asked to speak with the hospitalist on call if the hospitalist that took care of you is not available. Once you are discharged, your primary care physician will handle any further medical issues. Please note that NO REFILLS for any discharge medications will be authorized once you are discharged, as it is imperative that you return to your primary care physician (or establish a relationship with a primary care physician if you do not have one) for your aftercare needs so that they can reassess your need for medications and monitor your lab values.

## 2018-11-18 NOTE — Discharge Summary (Signed)
Janice Jennings, is a 48 y.o. female  DOB 1971-09-07  MRN [id number].  Admission date:  11/14/2018  Admitting Physician  Phillips Grout, MD  Discharge Date:  11/18/2018   Primary MD  Arnetha Courser, MD  Recommendations for primary care physician for things to follow:  -please  repeat BMP during next visit to ensure stable renal function, irbesartan once renal function stabilized. -Continue monitoring and adjustment of antihypertensive regimen  Admission Diagnosis  Acute respiratory failure with hypoxia ([place]) [J96.01]   Discharge Diagnosis  Acute respiratory failure with hypoxia ([place]) [J96.01]    Principal Problem:   Hemoptysis Active Problems:   DM type 2 with diabetic peripheral neuropathy ([place])   Malignant hypertension   Asthma   Acute respiratory failure with hypoxia ([facility])      Past Medical History:  Diagnosis Date  . Asthma   . Diabetes mellitus without complication ([place])   . Diabetic retinopathy of both eyes associated with type 2 diabetes mellitus ([place]) 04/11/2018   Moderate to severe, non-proliferative DR; referred to retinal specialist; April 10, 2018  . Hyperlipidemia   . Hypertension   . Migraine    Excedrin Tension HA OTC    Past Surgical History:  Procedure Laterality Date  . BREAST REDUCTION SURGERY  2001  . CESAREAN SECTION    . COLONOSCOPY WITH PROPOFOL N/A 08/15/2018   Procedure: COLONOSCOPY WITH PROPOFOL;  Surgeon: Jonathon Bellows, MD;  Location: [facility] ENDOSCOPY;  Service: Gastroenterology;  Laterality: N/A;  . ESOPHAGOGASTRODUODENOSCOPY (EGD) WITH PROPOFOL N/A 08/15/2018   Procedure: ESOPHAGOGASTRODUODENOSCOPY (EGD) WITH PROPOFOL;  Surgeon: Jonathon Bellows, MD;  Location: [facility] ENDOSCOPY;  Service: Gastroenterology;  Laterality: N/A;  . GIVENS CAPSULE STUDY N/A 09/08/2018   Procedure: GIVENS CAPSULE STUDY;  Surgeon: Jonathon Bellows, MD;  Location: [facility] ENDOSCOPY;  Service:  Gastroenterology;  Laterality: N/A;  . REDUCTION MAMMAPLASTY Bilateral 2000  . TUBAL LIGATION         History of present illness and  Hospital Course:     Kindly see H&P for history of present illness and admission details, please review complete Labs, Consult reports and Test reports for all details in brief  HPI  from the history and physical done on the day of admission 11/15/2017  HPI: Janice Jennings is a 47 y.o. female with medical history significant of hypertension, asthma, diabetes comes in tonight after sudden onset of acute shortness of breath with associated hemoptysis.  The hemoptysis quickly resolved start off with quite a bit and then tapered off to just sputum production.  She has not had any recent fevers.  She has been coughing since this started but has not been having coughing in the last couple days.  She denies any lower extremity edema or swelling.  She reports she has high blood pressure but is never as high as it is tonight.  She denies any chest pain.  Patient transferred here from urgent care because of hypoxia.  Patient be referred for admission for acute hypoxic respiratory failure currently on BiPAP and feeling  better of unclear etiology.  Hospital Course   48 year old female with history of diabetes, hypertension, CKD and asthma who presents with acute onset shortness of breath with cough productive of frothy pinkish sputum. On presentation found to have elevated blood pressure in the 419F systolics. She reports compliance to her blood pressure medications.  Required BiPAP initially, back on room air after diuresis and blood pressure control,   Hypoxic respiratory insufficiency -in the setting of flash pulmonary edema from hypertensive urgency -He did require BiPAP initially, but clinically improved after IV Lasix and better blood pressure control -Encouraged use incentive spirometry -Hemoptysis most likely in the setting of flash pulmonary edema, not real  hemoptysis, CTA chest with no evidence of PE, no recurrence -Resolved, currently on room air  Hypertensive urgency -Patient with known hypertension at baseline, but by reviewing from her history apparently has been consistently uncontrolled. -Stable medical since adjustment have been made, her Norvasc was increased from 5 mg to 10 mg oral daily, started on blood 25 mg oral twice daily, she was started on hydralazine, her dose was uptitrated, she will be discharged on 100 mg oral 3 times daily, her irbesartan has been held in the setting of AKI, and this should be resumed when her renal function stabilizes .  AKI on CKD stage III -Baseline creatinine 1.3, was 1.5 on admission, peaked at 2.28, this is most likely in the setting of contrast nephropathy with urine sodium of 46, and FENa of 0.8, with baseline CKD in the setting of diabetes and hypertension,, toxic medications has been held, it is trending down, her creatinine has plateaued, she has good urinary output, creatinine trending down to 2.1 today, she will be discharged with recommendation for close follow-up as an outpatient, patient reports will be able to schedule an appointment with her PCP either this Friday or Monday regarding repeat labs. -Hold irbesartan on discharge, hold all nephrotoxic medications. -Renal ultrasound with no acute findings    Diabetes mellitus, type II, insulin-dependent, uncontrolled -A1c is 8.1, BG were uncontrolled during hospital stay, hold her metformin on discharge given her renal failure, I have increased her Lantus to 55 units subcu daily.  Marland Kitchen  Discharge Condition:  stable   Follow UP  Follow-up Information    Lada, Satira Anis, MD Follow up in 3 day(s).   Specialty:  Family Medicine Contact information: [address] [place] [address] [phone]             Discharge Instructions  and  Discharge Medications     Discharge Instructions    Discharge instructions    Complete by:  As directed    Follow with Primary MD Lada, Satira Anis, MD in 7 days   Get CBC, CMP,  checked  by Primary MD next visit.    Activity: As tolerated with Full fall precautions use walker/cane & assistance as needed   Disposition Home    Diet: Heart Healthy , low salt, carbohydrate modified, with feeding assistance and aspiration precautions.  For Heart failure patients - Check your Weight same time everyday, if you gain over 2 pounds, or you develop in leg swelling, experience more shortness of breath or chest pain, call your Primary MD immediately. Follow Cardiac Low Salt Diet and 1.5 lit/day fluid restriction.   On your next visit with your primary care physician please Get Medicines reviewed and adjusted.   Please request your Prim.MD to go over all Hospital Tests and Procedure/Radiological results at the follow up, please  get all Hospital records sent to your Prim MD by signing hospital release before you go home.   If you experience worsening of your admission symptoms, develop shortness of breath, life threatening emergency, suicidal or homicidal thoughts you must seek medical attention immediately by calling 911 or calling your MD immediately  if symptoms less severe.  You Must read complete instructions/literature along with all the possible adverse reactions/side effects for all the Medicines you take and that have been prescribed to you. Take any new Medicines after you have completely understood and accpet all the possible adverse reactions/side effects.   Do not drive, operating heavy machinery, perform activities at heights, swimming or participation in water activities or provide baby sitting services if your were admitted for syncope or siezures until you have seen by Primary MD or a Neurologist and advised to do so again.  Do not drive when taking Pain medications.    Do not take more than prescribed Pain, Sleep and Anxiety Medications  Special  Instructions: If you have smoked or chewed Tobacco  in the last 2 yrs please stop smoking, stop any regular Alcohol  and or any Recreational drug use.  Wear Seat belts while driving.   Please note  You were cared for by a hospitalist during your hospital stay. If you have any questions about your discharge medications or the care you received while you were in the hospital after you are discharged, you can call the unit and asked to speak with the hospitalist on call if the hospitalist that took care of you is not available. Once you are discharged, your primary care physician will handle any further medical issues. Please note that NO REFILLS for any discharge medications will be authorized once you are discharged, as it is imperative that you return to your primary care physician (or establish a relationship with a primary care physician if you do not have one) for your aftercare needs so that they can reassess your need for medications and monitor your lab values.   Increase activity slowly   Complete by:  As directed      Allergies as of 11/18/2018   No Known Allergies     Medication List    STOP taking these medications   metFORMIN 1000 MG tablet Commonly known as:  GLUCOPHAGE   valsartan 160 MG tablet Commonly known as:  DIOVAN     TAKE these medications   albuterol 108 (90 Base) MCG/ACT inhaler Commonly known as:  PROVENTIL HFA;VENTOLIN HFA Inhale 2 puffs into the lungs every 4 (four) hours as needed for wheezing or shortness of breath.   amLODipine 10 MG tablet Commonly known as:  NORVASC Take 1 tablet (10 mg total) by mouth daily. Start taking on:  November 19, 2018 What changed:    medication strength  how much to take   aspirin EC 81 MG tablet Take 81 mg by mouth daily.   atorvastatin 80 MG tablet Commonly known as:  LIPITOR Take 1 tablet (80 mg total) by mouth daily. For cholesterol   BASAGLAR KWIKPEN 100 UNIT/ML Sopn Inject 0.55 mLs (55 Units total) into  the skin daily. What changed:  See the new instructions.   ezetimibe 10 MG tablet Commonly known as:  ZETIA Take 1 tablet (10 mg total) by mouth daily.   ferrous sulfate 325 (65 FE) MG tablet Take 325 mg by mouth daily with breakfast.   FREESTYLE LIBRE 14 DAY SENSOR Misc 1 each by Does not apply route 3 (  three) times daily. Check sugars 3x a day; ICD 10 E11.319  LON 99 months   FREESTYLE LIBRE READER Devi 1 each by Does not apply route 3 (three) times daily. Check sugars 3x a day; ICD 10 E11.319  LON 99 months   gabapentin 300 MG capsule Commonly known as:  NEURONTIN Take 1 capsule (300 mg total) by mouth 3 (three) times daily.   glucose blood test strip Commonly known as:  ONE TOUCH ULTRA TEST Use to check blood sugar two times a day.   hydrALAZINE 100 MG tablet Commonly known as:  APRESOLINE Take 1 tablet (100 mg total) by mouth 3 (three) times daily.   Insulin Pen Needle 31G X 5 MM Misc Use one pen needle two times daily.   metoprolol tartrate 25 MG tablet Commonly known as:  LOPRESSOR Take 1 tablet (25 mg total) by mouth 2 (two) times daily.   metroNIDAZOLE 0.75 % gel Commonly known as:  METROGEL Apply 1 application topically 2 (two) times daily.   multivitamin tablet Take 1 tablet by mouth daily.   ONETOUCH DELICA LANCETS 63Z Misc 1 each by Does not apply route daily.   Tiotropium Bromide-Olodaterol 2.5-2.5 MCG/ACT Aers Commonly known as:  STIOLTO RESPIMAT Inhale 1 puff into the lungs 2 (two) times daily.         Diet and Activity recommendation: See Discharge Instructions above   Consults obtained -  None   Major procedures and Radiology Reports - PLEASE review detailed and final reports for all details, in brief -     Dg Chest 2 View  Result Date: 11/14/2018 CLINICAL DATA:  48 year old female with shortness of breath, cough and hemoptysis for 3 hours. EXAM: CHEST - 2 VIEW COMPARISON:  CTA chest 09/17/2017 and earlier. FINDINGS: Confluent left  greater than right lower lung opacity. Air bronchograms on the left. No pleural effusion or pneumothorax. Pulmonary vascularity remains normal. Normal cardiac size and mediastinal contours. Visualized tracheal air column is within normal limits. No acute osseous abnormality identified. Negative visible bowel gas pattern. IMPRESSION: Left greater than right lower lung consolidation which is nonspecific. Top considerations in this setting include pneumonia and pulmonary hemorrhage. No pleural effusion. Electronically Signed   By: Genevie Ann M.D.   On: 11/14/2018 22:34   Ct Angio Chest Pe W And/or Wo Contrast  Result Date: 11/14/2018 CLINICAL DATA:  48 year old female with cough and shortness of breath. EXAM: CT ANGIOGRAPHY CHEST WITH CONTRAST TECHNIQUE: Multidetector CT imaging of the chest was performed using the standard protocol during bolus administration of intravenous contrast. Multiplanar CT image reconstructions and MIPs were obtained to evaluate the vascular anatomy. CONTRAST:  182mL ISOVUE-370 IOPAMIDOL (ISOVUE-370) INJECTION 76% COMPARISON:  Chest radiograph dated 11/14/2018 and CT dated 11/17/2016 FINDINGS: Cardiovascular: Borderline cardiomegaly. No pericardial effusion. The thoracic aorta is unremarkable. The origins of the great vessels of the aortic arch appear patent as visualized. There is no CT evidence of pulmonary embolism. Mediastinum/Nodes: No hilar or mediastinal adenopathy. Esophagus and the thyroid gland are grossly unremarkable. No mediastinal fluid collection. Lungs/Pleura: Diffuse airspace opacity may represent pulmonary edema, ARDS, or pneumonia. Clinical correlation is recommended. There is no pleural effusion or pneumothorax. The central airways are patent. Upper Abdomen: No acute abnormality. Musculoskeletal: No chest wall abnormality. No acute or significant osseous findings. Review of the MIP images confirms the above findings. IMPRESSION: 1. No CT evidence of pulmonary embolism. 2.  Diffuse airspace opacity may represent pulmonary edema, ARDS, or pneumonia. Clinical correlation is recommended. Electronically Signed  By: Anner Crete M.D.   On: 11/14/2018 22:52   US Renal  Result Date: 11/16/2018 CLINICAL DATA:  Renal insufficiency EXAM: RENAL / URINARY TRACT ULTRASOUND COMPLETE COMPARISON:  October 14, 2018 FINDINGS: Right Kidney: Renal measurements: 11.4 x 4.6 x 6.3 cm = volume: 171.6 mL . Echogenicity and renal cortical thickness are within normal limits. No mass, perinephric fluid, or hydronephrosis visualized. No sonographically demonstrable calculus or ureterectasis. Left Kidney: Renal measurements: 10.4 x 5.0 x 5.9 cm = volume: 161.6 mL. Echogenicity and renal cortical thickness are within normal limits. No mass, perinephric fluid, or hydronephrosis visualized. No sonographically demonstrable calculus or ureterectasis. Bladder: Appears normal for degree of bladder distention. IMPRESSION: Study within normal limits. Electronically Signed   By: Lowella Grip III M.D.   On: 11/16/2018 09:05    Micro Results     Recent Results (from the past 240 hour(s))  Culture, blood (routine x 2)     Status: None (Preliminary result)   Collection Time: 11/14/18 11:10 PM  Result Value Ref Range Status   Specimen Description   Final    BLOOD RIGHT ANTECUBITAL Performed at [facility], [address] [place], [place] [address]    Special Requests   Final    BOTTLES DRAWN AEROBIC AND ANAEROBIC Blood Culture adequate volume Performed at [facility], [address] [place], [place] [address]    Culture   Final    NO GROWTH 2 DAYS Performed at [facility], [place] [address], [place], [place] [address]    Report Status PENDING  Incomplete  MRSA PCR Screening     Status: None   Collection Time: 11/15/18  5:55 AM  Result Value Ref Range Status   MRSA by PCR NEGATIVE NEGATIVE Final    Comment:        The GeneXpert MRSA Assay (FDA approved for  NASAL specimens only), is one component of a comprehensive MRSA colonization surveillance program. It is not intended to diagnose MRSA infection nor to guide or monitor treatment for MRSA infections. Performed at [facility], [place] [address] [place], [place] [address]        Today   Subjective:   Janice Jennings today has no headache,no chest or abdominal pain,no new weakness tingling or numbness, feels much better wants to go home today.   Objective:   Blood pressure (!) 173/83, pulse 80, temperature 98 F (36.7 C), temperature source Oral, resp. rate 15, height 5\' 4"  (1.626 m), weight 93.5 kg, SpO2 98 %.  No intake or output data in the 24 hours ending 11/18/18 1004  Exam Awake Alert, Oriented x 3, No new F.N deficits, Normal affect [place].AT,PERRAL Supple Neck,No JVD, No cervical lymphadenopathy appriciated.  Symmetrical Chest wall movement, Good air movement bilaterally, CTAB RRR,No Gallops,Rubs or new Murmurs, No Parasternal Heave +ve B.Sounds, Abd Soft, Non tender, No organomegaly appriciated, No rebound -guarding or rigidity. No Cyanosis, Clubbing or edema, No new Rash or bruise  Data Review   CBC w Diff:  Lab Results  Component Value Date   WBC 8.1 11/15/2018   HGB 10.4 (L) 11/15/2018   HGB 10.1 (L) 03/04/2018   HCT 31.9 (L) 11/15/2018   HCT 30.7 (L) 03/04/2018   PLT 258 11/15/2018   PLT 427 (H) 03/04/2018   LYMPHOPCT 30 11/14/2018   MONOPCT 7 11/14/2018   EOSPCT 5 11/14/2018   BASOPCT 1 11/14/2018    CMP:  Lab Results  Component Value  Date   NA 136 11/18/2018   NA 134 (L) 02/14/2013   K 4.4 11/18/2018   K 4.1 02/14/2013   CL 108 11/18/2018   CL 101 02/14/2013   CO2 21 (L) 11/18/2018   CO2 25 02/14/2013   BUN 41 (H) 11/18/2018   BUN 15 02/14/2013   CREATININE 2.10 (H) 11/18/2018   CREATININE 1.34 (H) 06/19/2018   PROT 8.0 11/14/2018   PROT 8.7 (H) 02/14/2013   ALBUMIN 3.6 11/14/2018   ALBUMIN 3.8 02/14/2013   BILITOT 0.9 11/14/2018     BILITOT 0.6 02/14/2013   ALKPHOS 64 11/14/2018   ALKPHOS 77 02/14/2013   AST 18 11/14/2018   AST 13 (L) 02/14/2013   ALT 17 11/14/2018   ALT 18 02/14/2013  .   Total Time in preparing paper work, data evaluation and todays exam - 58 minutes  Phillips Climes M.D on 11/18/2018 at 10:04 AM  [facility]   Office  [phone]

## 2018-11-19 ENCOUNTER — Telehealth: Payer: Self-pay

## 2018-11-19 DIAGNOSIS — I10 Essential (primary) hypertension: Secondary | ICD-10-CM

## 2018-11-19 DIAGNOSIS — N183 Chronic kidney disease, stage 3 unspecified: Secondary | ICD-10-CM

## 2018-11-19 DIAGNOSIS — E7800 Pure hypercholesterolemia, unspecified: Secondary | ICD-10-CM

## 2018-11-19 DIAGNOSIS — E1142 Type 2 diabetes mellitus with diabetic polyneuropathy: Secondary | ICD-10-CM

## 2018-11-19 DIAGNOSIS — E113393 Type 2 diabetes mellitus with moderate nonproliferative diabetic retinopathy without macular edema, bilateral: Secondary | ICD-10-CM

## 2018-11-19 NOTE — Telephone Encounter (Signed)
Attempted to reach patient for TCM call and to confirm follow up appt.   Left msg for pt to call me back at [phone].

## 2018-11-19 NOTE — Telephone Encounter (Signed)
Pt returned my call while I was on another call and stated she was doing fine and appreciative of call.   Attempted to contact patient again and left another voicemail to complete TCM call.

## 2018-11-20 ENCOUNTER — Ambulatory Visit: Payer: 59 | Admitting: Family Medicine

## 2018-11-20 LAB — CULTURE, BLOOD (ROUTINE X 2)
Culture: NO GROWTH
Special Requests: ADEQUATE

## 2018-11-20 NOTE — Telephone Encounter (Signed)
Transition Care Management Follow-up Telephone Call  Date of discharge and from where: 11/18/18 [facility]  How have you been since you were released from the hospital? Pt states she has been doing okay and feeling better  Any questions or concerns? Yes  pt states she cannot locate her glucometer to check her blood sugar and does not remember the name of the test strips but confirms it is not the freestyle libre (due to cost) or the one touch. Pt to contact us with preferred meter and strips if she needs a refill on her testing supplies to better track her blood sugar.   Patient states she is also interested in possibly consulting with chronic care management team for better assistance with managing her diabetes and hypertension. Referral sent to CCM team.   Items Reviewed:  Did the pt receive and understand the discharge instructions provided? Yes   Medications obtained and verified? Yes   Any new allergies since your discharge? No   Dietary orders reviewed? Yes  Do you have support at home? Yes   Functional Questionnaire: (I = Independent and D = Dependent) ADLs: I  Bathing/Dressing- I  Meal Prep- I  Eating- I  Maintaining continence- I  Transferring/Ambulation- I  Managing Meds- I  Follow up appointments reviewed:   PCP Hospital f/u appt confirmed? Yes  Scheduled to see Dr. Sanda Klein on 12/02/18 @ 3:40.  Are transportation arrangements needed? No   If their condition worsens, is the pt aware to call PCP or go to the Emergency Dept.? Yes  Was the patient provided with contact information for the PCP's office or ED? Yes  Was to pt encouraged to call back with questions or concerns? Yes

## 2018-11-21 ENCOUNTER — Ambulatory Visit: Payer: Self-pay | Admitting: Pharmacist

## 2018-11-21 ENCOUNTER — Telehealth: Payer: Self-pay

## 2018-11-21 ENCOUNTER — Encounter: Payer: Self-pay | Admitting: Family Medicine

## 2018-11-21 DIAGNOSIS — E7800 Pure hypercholesterolemia, unspecified: Secondary | ICD-10-CM

## 2018-11-21 DIAGNOSIS — N183 Chronic kidney disease, stage 3 unspecified: Secondary | ICD-10-CM

## 2018-11-21 DIAGNOSIS — I10 Essential (primary) hypertension: Secondary | ICD-10-CM

## 2018-11-21 DIAGNOSIS — E1142 Type 2 diabetes mellitus with diabetic polyneuropathy: Secondary | ICD-10-CM

## 2018-11-21 NOTE — Telephone Encounter (Signed)
Spoke with pt and informed her of Capsule study results, as pt requested in [place] message.

## 2018-11-21 NOTE — Chronic Care Management (AMB) (Signed)
  Care Management   Note  11/21/2018 Name: Janice Jennings MRN: [id number] DOB: 24-Jan-1971  Janice Jennings is a 48 y.o. year old female who sees Lada, Satira Anis, MD for primary care. Janice Marker, LPN asked the CCM team to consult the patient for assistance with chronic disease management related to medication management, DM management. Referral was placed 11/21/2018. Telephone outreach to patient today to introduce care management services.   Plan: [name] requests that the care management team schedule her initial appointment at her hospital follow up with Dr. Sanda Klein on February 4th.    Ruben Reason, PharmD Clinical Pharmacist [facility] [phone]

## 2018-11-21 NOTE — Telephone Encounter (Signed)
If we manage her diabetes, then yes for testing supplies; she needs to be testing 3x a day since she is on insulin Appointment for the other issues ASAP She should have had a hospital f/u appt scheduled with me within 7 days of discharge (see d/c summary); get sooner appt ASAP with me or Suezanne Cheshire, DNP  Discharge Instructions    Discharge instructions   Complete by:  As directed    Follow with Primary MD Lada, Satira Anis, MD in 7 days   Get CBC, CMP,  checked  by Primary MD next visit.

## 2018-11-21 NOTE — Patient Instructions (Signed)
Discuss care management at hospital follow up office visit on February 4th.   The patient verbalized understanding of instructions provided today and declined a print copy of patient instruction materials.

## 2018-11-22 ENCOUNTER — Other Ambulatory Visit: Payer: Self-pay | Admitting: Nurse Practitioner

## 2018-11-22 DIAGNOSIS — I10 Essential (primary) hypertension: Secondary | ICD-10-CM

## 2018-11-24 ENCOUNTER — Other Ambulatory Visit: Payer: Self-pay

## 2018-11-24 MED ORDER — GLUCOSE BLOOD VI STRP: ORAL_STRIP | 5 refills | Status: DC

## 2018-11-24 MED ORDER — BLOOD GLUCOSE MONITORING SUPPL W/DEVICE KIT: 1.0000 | PACK | 0 refills | Status: AC

## 2018-11-24 MED ORDER — ONETOUCH DELICA LANCETS 33G MISC: 1.0000 | Freq: Every day | 3 refills | Status: DC

## 2018-11-24 MED ORDER — ACCU-CHEK AVIVA PLUS W/DEVICE KIT: 1.0000 | PACK | Freq: Once | 0 refills | Status: AC

## 2018-11-24 MED ORDER — GLUCOSE BLOOD VI STRP: ORAL_STRIP | 12 refills | Status: DC

## 2018-11-24 MED ORDER — ACCU-CHEK SOFTCLIX LANCETS MISC: 12 refills | Status: DC

## 2018-11-24 NOTE — Telephone Encounter (Signed)
Amlodipine 5 mg requested Patient is not supposed to be taking the 5 mg any more She needs to be using the 10 mg that they prescribed at hospital discharge

## 2018-11-25 NOTE — Telephone Encounter (Signed)
Pt.notified

## 2018-11-25 NOTE — Progress Notes (Signed)
Transitions of Care Follow Up Call Note  Janice Jennings is an 48 y.o. female who presented to [facility] on 11/14/2018.  The patient had the following prescriptions filled at [facility]: Amlodipine  Patient was called by pharmacist and HIPAA identifiers were verified. The following questions were asked about the prescriptions filled at [facility]:  Has the patient been experiencing any side effects to the medications prescribed? no Understanding of regimen: fair Understanding of indications: fair Potential of compliance: good  Pharmacist comments: N/A   [x]  Patient's prescriptions with refills filled at the [facility] Transitions [facility] Care Pharmacy were transferred to the following pharmacy: [facility]  []  Patient unable to be reached after calling three times and prescriptions filled at the [facility] Transitions of Care Pharmacy were transferred to preferred pharmacy found within their chart.   Melissa A Lore 11/25/2018, 5:19 PM Transitions of Care Pharmacy Hours: Monday - Friday 8:30am to 5:00 PM  Phone - [phone]

## 2018-11-26 DIAGNOSIS — I129 Hypertensive chronic kidney disease with stage 1 through stage 4 chronic kidney disease, or unspecified chronic kidney disease: Secondary | ICD-10-CM | POA: Diagnosis not present

## 2018-11-26 DIAGNOSIS — N183 Chronic kidney disease, stage 3 (moderate): Secondary | ICD-10-CM | POA: Diagnosis not present

## 2018-11-26 DIAGNOSIS — N179 Acute kidney failure, unspecified: Secondary | ICD-10-CM | POA: Diagnosis not present

## 2018-11-26 DIAGNOSIS — E1122 Type 2 diabetes mellitus with diabetic chronic kidney disease: Secondary | ICD-10-CM | POA: Diagnosis not present

## 2018-11-27 ENCOUNTER — Ambulatory Visit: Payer: 59 | Admitting: Family Medicine

## 2018-11-27 DIAGNOSIS — H4312 Vitreous hemorrhage, left eye: Secondary | ICD-10-CM | POA: Diagnosis not present

## 2018-11-27 DIAGNOSIS — E113492 Type 2 diabetes mellitus with severe nonproliferative diabetic retinopathy without macular edema, left eye: Secondary | ICD-10-CM | POA: Diagnosis not present

## 2018-12-02 ENCOUNTER — Ambulatory Visit: Payer: 59 | Admitting: Family Medicine

## 2018-12-02 ENCOUNTER — Encounter: Payer: Self-pay | Admitting: Family Medicine

## 2018-12-02 ENCOUNTER — Ambulatory Visit: Payer: Self-pay | Admitting: Pharmacist

## 2018-12-02 VITALS — BP 150/90 | HR 94 | Temp 97.9°F | Ht 63.0 in | Wt 205.9 lb

## 2018-12-02 DIAGNOSIS — I10 Essential (primary) hypertension: Secondary | ICD-10-CM

## 2018-12-02 DIAGNOSIS — E113393 Type 2 diabetes mellitus with moderate nonproliferative diabetic retinopathy without macular edema, bilateral: Secondary | ICD-10-CM

## 2018-12-02 DIAGNOSIS — N183 Chronic kidney disease, stage 3 unspecified: Secondary | ICD-10-CM

## 2018-12-02 DIAGNOSIS — E871 Hypo-osmolality and hyponatremia: Secondary | ICD-10-CM

## 2018-12-02 DIAGNOSIS — E7800 Pure hypercholesterolemia, unspecified: Secondary | ICD-10-CM

## 2018-12-02 DIAGNOSIS — J810 Acute pulmonary edema: Secondary | ICD-10-CM | POA: Diagnosis not present

## 2018-12-02 DIAGNOSIS — R042 Hemoptysis: Secondary | ICD-10-CM | POA: Diagnosis not present

## 2018-12-02 DIAGNOSIS — E1142 Type 2 diabetes mellitus with diabetic polyneuropathy: Secondary | ICD-10-CM

## 2018-12-02 DIAGNOSIS — Z794 Long term (current) use of insulin: Secondary | ICD-10-CM

## 2018-12-02 MED ORDER — LINAGLIPTIN 5 MG PO TABS: 5.0000 mg | ORAL_TABLET | Freq: Every day | ORAL | 1 refills | Status: DC

## 2018-12-02 NOTE — Chronic Care Management (AMB) (Signed)
  Care Management   Note  12/02/2018 Name: Janice Jennings MRN: [id number] DOB: January 22, 1971  Janice Jennings is a pleasant 48 y.o. year old female patient in the office today for a follow up visit with Lada, Satira Anis, MD for post hospitalization follow up.   Clemetine Marker, LPN asked the CCM team to consult today for assistance with chronic disease management related to medication management, DM management. Janice Jennings agreed that care management services would be helpful to them.    Plan: I have scheduled a call to [name] next week. Patient agreed to services and verbal consent obtained.  Janice Jennings was given information about Care Management services today including:  1. Case Management services includes personalized support from designated clinical staff supervised by her physician, including individualized plan of care and coordination with other care providers 2. 24/7 contact phone numbers for assistance for urgent and routine care needs. 3. The patient may stop case management services at any time by phone call to the office staff.   Ruben Reason, PharmD Clinical Pharmacist [facility] [phone]

## 2018-12-02 NOTE — Patient Instructions (Addendum)
Try to follow the DASH guidelines (DASH stands for Dietary Approaches to Stop Hypertension). Try to limit the sodium in your diet to no more than 1,500mg  of sodium per day. Certainly try to not exceed 2,000 mg per day at the very most. Do not add salt when cooking or at the table.  Check the sodium amount on labels when shopping, and choose items lower in sodium when given a choice. Avoid or limit foods that already contain a lot of sodium. Eat a diet rich in fruits and vegetables and whole grains, and try to lose weight if overweight or obese  Check out the information at familydoctor.org entitled "Nutrition for Weight Loss: What You Need to Know about Fad Diets" Try to lose between 1-2 pounds per week by taking in fewer calories and burning off more calories You can succeed by limiting portions, limiting foods dense in calories and fat, becoming more active, and drinking 8 glasses of water a day (64 ounces) Don't skip meals, especially breakfast, as skipping meals may alter your metabolism Do not use over-the-counter weight loss pills or gimmicks that claim rapid weight loss A healthy BMI (or body mass index) is between 18.5 and 24.9 You can calculate your ideal BMI at the [place] website ClubMonetize.fr  If you need something for aches or pains, try to use Tylenol (acetaminophen) instead of non-steroidals (which include Aleve, ibuprofen, Advil, Motrin, and naproxen); non-steroidals can cause long-term kidney damage Try to use PLAIN allergy medicine without the decongestant Avoid: phenylephrine, phenylpropanolamine, and pseudoephredine  We'll see what Dr. Juleen China has to say about your blood pressure medicine Please contact his office if you have not heard anything by noon on Wednesday   Obesity, Adult Obesity is the condition of having too much total body fat. Being overweight or obese means that your weight is greater than what is considered  healthy for your body size. Obesity is determined by a measurement called BMI. BMI is an estimate of body fat and is calculated from height and weight. For adults, a BMI of 30 or higher is considered obese. Obesity can eventually lead to other health concerns and major illnesses, including:  Stroke.  Coronary artery disease (CAD).  Type 2 diabetes.  Some types of cancer, including cancers of the colon, breast, uterus, and gallbladder.  Osteoarthritis.  High blood pressure (hypertension).  High cholesterol.  Sleep apnea.  Gallbladder stones.  Infertility problems. What are the causes? The main cause of obesity is taking in (consuming) more calories than your body uses for energy. Other factors that contribute to this condition may include:  Being born with genes that make you more likely to become obese.  Having a medical condition that causes obesity. These conditions include: ? Hypothyroidism. ? Polycystic ovarian syndrome (PCOS). ? Binge-eating disorder. ? Cushing syndrome.  Taking certain medicines, such as steroids, antidepressants, and seizure medicines.  Not being physically active (sedentary lifestyle).  Living where there are limited places to exercise safely or buy healthy foods.  Not getting enough sleep. What increases the risk? The following factors may increase your risk of this condition:  Having a family history of obesity.  Being a woman of African-American descent.  Being a man of Hispanic descent. What are the signs or symptoms? Having excessive body fat is the main symptom of this condition. How is this diagnosed? This condition may be diagnosed based on:  Your symptoms.  Your medical history.  A physical exam. Your health care provider may measure: ? Your  BMI. If you are an adult with a BMI between 25 and less than 30, you are considered overweight. If you are an adult with a BMI of 30 or higher, you are considered obese. ? The distances  around your hips and your waist (circumferences). These may be compared to each other to help diagnose your condition. ? Your skinfold thickness. Your health care provider may gently pinch a fold of your skin and measure it. How is this treated? Treatment for this condition often includes changing your lifestyle. Treatment may include some or all of the following:  Dietary changes. Work with your health care provider and a dietitian to set a weight-loss goal that is healthy and reasonable for you. Dietary changes may include eating: ? Smaller portions. A portion size is the amount of a particular food that is healthy for you to eat at one time. This varies from person to person. ? Low-calorie or low-fat options. ? More whole grains, fruits, and vegetables.  Regular physical activity. This may include aerobic activity (cardio) and strength training.  Medicine to help you lose weight. Your health care provider may prescribe medicine if you are unable to lose 1 pound a week after 6 weeks of eating more healthily and doing more physical activity.  Surgery. Surgical options may include gastric banding and gastric bypass. Surgery may be done if: ? Other treatments have not helped to improve your condition. ? You have a BMI of 40 or higher. ? You have life-threatening health problems related to obesity. Follow these instructions at home:  Eating and drinking   Follow recommendations from your health care provider about what you eat and drink. Your health care provider may advise you to: ? Limit fast foods, sweets, and processed snack foods. ? Choose low-fat options, such as low-fat milk instead of whole milk. ? Eat 5 or more servings of fruits or vegetables every day. ? Eat at home more often. This gives you more control over what you eat. ? Choose healthy foods when you eat out. ? Learn what a healthy portion size is. ? Keep low-fat snacks on hand. ? Avoid sugary drinks, such as soda, fruit  juice, iced tea sweetened with sugar, and flavored milk. ? Eat a healthy breakfast.  Drink enough water to keep your urine clear or pale yellow.  Do not go without eating for long periods of time (do not fast) or follow a fad diet. Fasting and fad diets can be unhealthy and even dangerous. Physical Activity  Exercise regularly, as told by your health care provider. Ask your health care provider what types of exercise are safe for you and how often you should exercise.  Warm up and stretch before being active.  Cool down and stretch after being active.  Rest between periods of activity. Lifestyle  Limit the time that you spend in front of your TV, computer, or video game system.  Find ways to reward yourself that do not involve food.  Limit alcohol intake to no more than 1 drink a day for nonpregnant women and 2 drinks a day for men. One drink equals 12 oz of beer, 5 oz of wine, or 1 oz of hard liquor. General instructions  Keep a weight loss journal to keep track of the food you eat and how much you exercise you get.  Take over-the-counter and prescription medicines only as told by your health care provider.  Take vitamins and supplements only as told by your health care provider.  Consider joining a support group. Your health care provider may be able to recommend a support group.  Keep all follow-up visits as told by your health care provider. This is important. Contact a health care provider if:  You are unable to meet your weight loss goal after 6 weeks of dietary and lifestyle changes. This information is not intended to replace advice given to you by your health care provider. Make sure you discuss any questions you have with your health care provider. Document Released: 11/22/2004 Document Revised: 03/19/2016 Document Reviewed: 08/03/2015 Elsevier Interactive Patient Education  2019 Elsevier Inc.  Preventing Unhealthy [employer], Adult Staying at a healthy weight is  important to your overall health. When fat builds up in your body, you may become overweight or obese. Being overweight or obese increases your risk of developing certain health problems, such as heart disease, diabetes, sleeping problems, joint problems, and some types of cancer. Unhealthy weight gain is often the result of making unhealthy food choices or not getting enough exercise. You can make changes to your lifestyle to prevent obesity and stay as healthy as possible. What nutrition changes can be made?   Eat only as much as your body needs. To do this: ? Pay attention to signs that you are hungry or full. Stop eating as soon as you feel full. ? If you feel hungry, try drinking water first before eating. Drink enough water so your urine is clear or pale yellow. ? Eat smaller portions. Pay attention to portion sizes when eating out. ? Look at serving sizes on food labels. Most foods contain more than one serving per container. ? Eat the recommended number of calories for your gender and activity level. For most active people, a daily total of 2,000 calories is appropriate. If you are trying to lose weight or are not very active, you may need to eat fewer calories. Talk with your health care provider or a diet and nutrition specialist (dietitian) about how many calories you need each day.  Choose healthy foods, such as: ? Fruits and vegetables. At each meal, try to fill at least half of your plate with fruits and vegetables. ? Whole grains, such as whole-wheat bread, brown rice, and quinoa. ? Lean meats, such as chicken or fish. ? Other healthy proteins, such as beans, eggs, or tofu. ? Healthy fats, such as nuts, seeds, fatty fish, and olive oil. ? Low-fat or fat-free dairy products.  Check food labels, and avoid food and drinks that: ? Are high in calories. ? Have added sugar. ? Are high in sodium. ? Have saturated fats or trans fats.  Cook foods in healthier ways, such as by baking,  broiling, or grilling.  Make a meal plan for the week, and shop with a grocery list to help you stay on track with your purchases. Try to avoid going to the grocery store when you are hungry.  When grocery shopping, try to shop around the outside of the store first, where the fresh foods are. Doing this helps you to avoid prepackaged foods, which can be high in sugar, salt (sodium), and fat. What lifestyle changes can be made?   Exercise for 30 or more minutes on 5 or more days each week. Exercising may include brisk walking, yard work, biking, running, swimming, and team sports like basketball and soccer. Ask your health care provider which exercises are safe for you.  Do muscle-strengthening activities, such as lifting weights or using resistance bands, on 2 or  more days a week.  Do not use any products that contain nicotine or tobacco, such as cigarettes and e-cigarettes. If you need help quitting, ask your health care provider.  Limit alcohol intake to no more than 1 drink a day for nonpregnant women and 2 drinks a day for men. One drink equals 12 oz of beer, 5 oz of wine, or 1 oz of hard liquor.  Try to get 7-9 hours of sleep each night. What other changes can be made?  Keep a food and activity journal to keep track of: ? What you ate and how many calories you had. Remember to count the calories in sauces, dressings, and side dishes. ? Whether you were active, and what exercises you did. ? Your calorie, weight, and activity goals.  Check your weight regularly. Track any changes. If you notice you have gained weight, make changes to your diet or activity routine.  Avoid taking weight-loss medicines or supplements. Talk to your health care provider before starting any new medicine or supplement.  Talk to your health care provider before trying any new diet or exercise plan. Why are these changes important? Eating healthy, staying active, and having healthy habits can help you to  prevent obesity. Those changes also:  Help you manage stress and emotions.  Help you connect with friends and family.  Improve your self-esteem.  Improve your sleep.  Prevent long-term health problems. What can happen if changes are not made? Being obese or overweight can cause you to develop joint or bone problems, which can make it hard for you to stay active or do activities you enjoy. Being obese or overweight also puts stress on your heart and lungs and can lead to health problems like diabetes, heart disease, and some cancers. Where to find more information Talk with your health care provider or a dietitian about healthy eating and healthy lifestyle choices. You may also find information from:  U.[place]. Department of Agriculture, MyPlate: FormerBoss.no  American Heart Association: www.heart.org  Centers for Disease Control and Prevention: http://www.wolf.info/ Summary  Staying at a healthy weight is important to your overall health. It helps you to prevent certain diseases and health problems, such as heart disease, diabetes, joint problems, sleep disorders, and some types of cancer.  Being obese or overweight can cause you to develop joint or bone problems, which can make it hard for you to stay active or do activities you enjoy.  You can prevent unhealthy weight gain by eating a healthy diet, exercising regularly, not smoking, limiting alcohol, and getting enough sleep.  Talk with your health care provider or a dietitian for guidance about healthy eating and healthy lifestyle choices. This information is not intended to replace advice given to you by your health care provider. Make sure you discuss any questions you have with your health care provider. Document Released: 10/16/2016 Document Revised: 07/26/2017 Document Reviewed: 11/21/2016 Elsevier Interactive Patient Education  2019 [place] Diet A vegan diet excludes all foods that come from animals, including  foods that are made from meat, fish, poultry, dairy, and eggs. A vegan diet may be followed for ethical or health reasons. What are tips for following this plan? While following this diet, it is important to find plant sources or supplements that contain the same nutrients you would find in the foods that come from animals. Talk with your health care provider or dietitian about whether you should be taking nutritional supplements. What do I need to know about the  vegan diet?  This diet includes all foods that come from plants. Some people choose not to eat sea vegetables, such as seaweed.  This diet excludes all foods that come from animals.  A vegan diet lacks certain nutrients that are commonly found in animal products. These nutrients include: ? Protein. ? Vitamin B12. ? Vitamin D. ? Iron. ? Omega-3 fatty acids. ? Calcium. ? Zinc. What foods can I eat?  Fruits Any. Vegetables Any (except sea vegetables, such as kelp and seaweed, if you choose not to eat them). Grains Any. Meats and other proteins Beans, such as black beans or kidney beans. Other legumes, such as lentils and split peas. Soy products. Nuts, such as almonds, [place] nuts, and pecans. Seeds, such as sunflower seeds. Tofu. Tempeh. Hummus. Dairy Any dairy product that is made with milk that comes from soy, almonds, rice, hemp, coconut, or any other type of nut. Fats and oils All vegetable-based oils, such as olive, canola, coconut, corn, safflower, peanut, and sesame oils. All vegan butters. Beverages Juice. Carbonated soft drinks. Tea. Sweets and desserts Any dessert that is labeled "vegan." Ice cream that is made with milk from soy, almond, rice, hemp, coconut, or other nuts and does not have other animal ingredients (such as chocolate chips that are made from cow milk). Vitamin B12 Breakfast cereals and prepared products that have added vitamin B12. Nutritional yeast. Vitamin D Orange juice. Fortified mushrooms.  Cereals with added vitamin D. Iron Dark leafy greens. Nuts. Beans. Grain products that have added iron, such as cereals. Tofu. Tempeh. Soybeans. Quinoa. Plant-based iron is absorbed better when it is eaten with a food that has vitamin C. Omega-3 fatty acids Walnuts. Foods with added omega-3 fatty acids, such as juices. Flax seeds. Hemp seeds. Canola oil and soybean oil. Tofu. Calcium Dark leafy greens, such as kale, bok choy, Chinese cabbage, collard greens, and mustard greens. Broccoli. Okra. Soy products with added calcium. Calcium-fortified breakfast cereals. Calcium-fortified fruit juices. Figs. Zinc Wheat germ, cereals, and breads that have added zinc. Baked beans. Legumes, such as cashews, chickpeas, kidney beans, and green peas. Almonds and nut butters. Tofu and other soy products. Seasonings and condiments Nutritional yeast. Salt. Pepper. Fresh herbs. Soy sauce. Vegetable broth. The items listed above may not be a complete list of foods and beverages you can eat. Contact a dietitian for more options. What foods should I avoid? Fruits No fruits need to be avoided. All fruits are included in this diet. Vegetables Sea vegetables, such as kelp and seaweed (if you choose not to eat them). Grains No grains need to be avoided. All grains are included in this diet. Meats and other proteins Any animal meats. Poultry. Eggs. Fish. Seafood. Dairy Milk, cheese, yogurt, or ice cream that is made from cow milk, goat milk, or sheep milk. Fats and oils Butter. Lard. Beverages Cow milk. Goat milk. Sheep milk. Drinks that are sweetened with honey. Sweets and desserts Any desserts that are made with eggs or animal milk. Honey. Cheesecake. Seasonings and condiments Honey. Any condiments made with eggs or animal milk. The items listed above may not be a complete list of foods and beverages to avoid. Contact a dietitian for more information. Summary  A vegan diet excludes any foods or drinks that  come from an animal.  A vegan diet may be followed for ethical or health reasons.  When following this diet, you can become deficient in certain vitamins and minerals. These include vitamin B12, iron, calcium, and zinc. Talk  with your health care provider or dietitian to make sure you get these important nutrients in your diet. This information is not intended to replace advice given to you by your health care provider. Make sure you discuss any questions you have with your health care provider. Document Released: 06/18/2014 Document Revised: 11/21/2017 Document Reviewed: 11/21/2017 Elsevier Interactive Patient Education  2019 [employer].

## 2018-12-02 NOTE — Patient Instructions (Addendum)
Janice Jennings was given information about Care Management services today including:  1. Case Management services includes personalized support from designated clinical staff supervised by her physician, including individualized plan of care and coordination with other care providers 2. 24/7 contact phone numbers for assistance for urgent and routine care needs. 3. The patient may stop case management services at any time by phone call to the office staff.  Patient agreed to services and verbal consent obtained.    The patient verbalized understanding of instructions provided today and declined a print copy of patient instruction materials.     Thank you allowing the Chronic Care Management Team to be a part of your care!  We will contact you within 1 week to schedule an initial appointment!   Please call a member of the CCM (Chronic Care Management) Team with any questions or case management needs:   Vanetta Mulders, BSN Nurse Care Coordinator  [phone]  Ruben Reason, PharmD  Clinical Pharmacist  [phone]

## 2018-12-02 NOTE — Progress Notes (Signed)
BP (!) 150/90   Pulse 94   Temp 97.9 F (36.6 C)   Ht 5\' 3"  (1.6 m)   Wt 205 lb 14.4 oz (93.4 kg)   SpO2 97%   BMI 36.47 kg/m    Subjective:    Patient ID: Janice Jennings, female    DOB: 04/03/1971, 48 y.o.   MRN: [id number]  HPI: Janice Jennings is a 48 y.o. female  Chief Complaint  Patient presents with  . Hospitalization Follow-up    HPI Patient is here for hospital follow-up She is "doing okay" Trying to get her blood pressure lower; watching what she eats; trying to watch sodium in her foods; not using many seasonings, just Mrs. Dash; no canned vegetables She has stopped NSAIDs; just tylenol if needed  She was admitted on January 17th and discharged on January 21st with hemoptysis Discharge summary reviewed BMP was recommended at f/u and she actually had labs done at the kidney doctor on Jan 29th Glucose 394 BUN 23, creatinine 1.57, GFR 44 BUN/Cr ratio 15 Sodium low at 133 Normal K+, Cl-, CO2, Ca2+, phos, alb Did have 3+ glucose in the urine, 3+ protein Lots of protein in the urine on the protein:Creatine Protein:Cr 6387 (normal 21-161) She had acute respiratory failure with hypoxia  Hemoptysis, then just phlegm; had BiPAP, fluid on the lungs Blood pressure was in the 564P systolic upon arrival to the ER Flash pulmonary edema because of HTN Treated IV lasix CTA of the chest, no PE Amlodipine was increased from 5 mg to 10 mg, no more swelling in the legs with higher dose Metoprolol 25 mg BID Irbesartan was held because of AKI and can be restarted when renal function stabilizes Sees Dr. Juleen China Baseline creatinine 1.3, 1.5 on admission, peaked at 2.28 Hydralazine 100 mg three times a day She was started on metoprolol 25 mg BID  Type 2 diabetes, uncontrolled; did not start insulin until late in hospital stay Metformin was stopped; lantus now 55 units daily (up from 50) Brought in her FSBS meter, but difficult to figure out and will take to  pharmacy   Renal US done Nov 16, 2018: IMPRESSION: Study within normal limits.   Electronically Signed   By: Lowella Grip III M.D.   On: 11/16/2018 09:05  CTA chest on Nov 14, 2018: IMPRESSION: 1. No CT evidence of pulmonary embolism. 2. Diffuse airspace opacity may represent pulmonary edema, ARDS, or pneumonia. Clinical correlation is recommended.   Electronically Signed   By: Anner Crete M.D.   On: 11/14/2018 22:52  She is not on atorvastatin per discharge instructions she says; however, we reviewed her summary and she should be taking that;s he'll start back Weight has been stable at home, weighing daily  D/C note says stop taking metformin and valsartan  Previously took tradjenta; no side effects  Anemia is chronic; no bleeding; she did the capsule study last year; capsule study came back normal, they could not find a source of anemia; she is very cold all the time, from the inside out [facility] and has not seen hematologist  Her weight goes up and down; stuck at a certain level; she did change her diet, going to plant based  Depression screen [facility] 2/9 12/02/2018 09/22/2018 06/19/2018 06/25/2016 10/20/2015  Decreased Interest 0 0 0 0 0  Down, Depressed, Hopeless 0 0 0 0 0  PHQ - 2 Score 0 0 0 0 0  Altered sleeping 0 0 - - -  Tired, decreased energy  0 0 - - -  Change in appetite 0 0 - - -  Feeling bad or failure about yourself  0 0 - - -  Trouble concentrating 0 0 - - -  Moving slowly or fidgety/restless 0 0 - - -  Suicidal thoughts 0 0 - - -  PHQ-9 Score 0 0 - - -  Difficult doing work/chores Not difficult at all Not difficult at all - - -   Fall Risk  12/02/2018 09/22/2018 06/19/2018 06/25/2016 10/20/2015  Falls in the past year? 0 0 No No No    Relevant past medical, surgical, family and social history reviewed Past Medical History:  Diagnosis Date  . Asthma   . Diabetes mellitus without complication ([place])   . Diabetic retinopathy of both eyes  associated with type 2 diabetes mellitus ([place]) 04/11/2018   Moderate to severe, non-proliferative DR; referred to retinal specialist; April 10, 2018  . Hyperlipidemia   . Hypertension   . Migraine    Excedrin Tension HA OTC   Past Surgical History:  Procedure Laterality Date  . BREAST REDUCTION SURGERY  2001  . CESAREAN SECTION    . COLONOSCOPY WITH PROPOFOL N/A 08/15/2018   Procedure: COLONOSCOPY WITH PROPOFOL;  Surgeon: Jonathon Bellows, MD;  Location: [facility] ENDOSCOPY;  Service: Gastroenterology;  Laterality: N/A;  . ESOPHAGOGASTRODUODENOSCOPY (EGD) WITH PROPOFOL N/A 08/15/2018   Procedure: ESOPHAGOGASTRODUODENOSCOPY (EGD) WITH PROPOFOL;  Surgeon: Jonathon Bellows, MD;  Location: [facility] ENDOSCOPY;  Service: Gastroenterology;  Laterality: N/A;  . GIVENS CAPSULE STUDY N/A 09/08/2018   Procedure: GIVENS CAPSULE STUDY;  Surgeon: Jonathon Bellows, MD;  Location: [facility] ENDOSCOPY;  Service: Gastroenterology;  Laterality: N/A;  . REDUCTION MAMMAPLASTY Bilateral 2000  . TUBAL LIGATION     Family History  Problem Relation Age of Onset  . Diabetes Father   . Hyperlipidemia Father   . Hypertension Father   . Cancer Maternal Aunt 90       leukemia - died  . Diabetes Maternal Grandmother   . Cancer Maternal Grandmother        breast cancer  . Stroke Maternal Grandmother   . Breast cancer Maternal Grandmother   . Diabetes Daughter        Pre-diabetes  . Diabetes Maternal Grandfather   . Hyperlipidemia Maternal Grandfather   . Hypertension Maternal Grandfather   . Cancer Maternal Aunt 50       breast cancer - died  . Ovarian cancer Neg Hx   . Colon cancer Neg Hx    Social History   Tobacco Use  . Smoking status: Never Smoker  . Smokeless tobacco: Never Used  Substance Use Topics  . Alcohol use: Yes    Alcohol/week: 0.0 standard drinks    Comment: occ  . Drug use: No     [facility] Visit from 12/02/2018 [facility]  AUDIT-C Score  0      Interim medical history since last visit  reviewed. Allergies and medications reviewed  Review of Systems Per HPI unless specifically indicated above     Objective:    BP (!) 150/90   Pulse 94   Temp 97.9 F (36.6 C)   Ht 5\' 3"  (1.6 m)   Wt 205 lb 14.4 oz (93.4 kg)   SpO2 97%   BMI 36.47 kg/m   Wt Readings from Last 3 Encounters:  12/02/18 205 lb 14.4 oz (93.4 kg)  11/18/18 206 lb 2.1 oz (93.5 kg)  09/22/18 200 lb 8 oz (90.9 kg)  Physical Exam Diabetic Foot Form - Detailed   Diabetic Foot Exam - detailed Diabetic Foot exam was performed with the following findings:  Yes 12/02/2018  5:28 PM  Visual Foot Exam completed.:  Yes  Pulse Foot Exam completed.:  Yes  Right Dorsalis Pedis:  Present Left Dorsalis Pedis:  Present  Sensory Foot Exam Completed.:  Yes Semmes-Weinstein Monofilament Test R Site 1-Great Toe:  Pos L Site 1-Great Toe:  Pos        Results for orders placed or performed during the hospital encounter of 11/14/18  Culture, blood (routine x 2)  Result Value Ref Range   Specimen Description      BLOOD RIGHT ANTECUBITAL Performed at [facility], [address] [place], [place] [address]    Special Requests      BOTTLES DRAWN AEROBIC AND ANAEROBIC Blood Culture adequate volume Performed at [facility], [address] [place], [place] [address]    Culture      NO GROWTH 5 DAYS Performed at [facility], [place] [address] [place], [place] [address]    Report Status 11/20/2018 FINAL   MRSA PCR Screening  Result Value Ref Range   MRSA by PCR NEGATIVE NEGATIVE  CBC with Differential  Result Value Ref Range   WBC 7.6 4.0 - 10.5 K/uL   RBC 3.93 3.87 - 5.11 MIL/uL   Hemoglobin 11.6 (L) 12.0 - 15.0 g/dL   HCT 36.9 36.0 - 46.0 %   MCV 93.9 80.0 - 100.0 fL   MCH 29.5 26.0 - 34.0 pg   MCHC 31.4 30.0 - 36.0 g/dL   RDW 12.7 11.5 - 15.5 %   Platelets 305 150 - 400 K/uL   nRBC 0.0 0.0 - 0.2 %   Neutrophils Relative % 57 %   Neutro Abs 4.3 1.7 - 7.7 K/uL   Lymphocytes  Relative 30 %   Lymphs Abs 2.3 0.7 - 4.0 K/uL   Monocytes Relative 7 %   Monocytes Absolute 0.5 0.1 - 1.0 K/uL   Eosinophils Relative 5 %   Eosinophils Absolute 0.4 0.0 - 0.5 K/uL   Basophils Relative 1 %   Basophils Absolute 0.1 0.0 - 0.1 K/uL   Immature Granulocytes 0 %   Abs Immature Granulocytes 0.03 0.00 - 0.07 K/uL  Comprehensive metabolic panel  Result Value Ref Range   Sodium 136 135 - 145 mmol/L   Potassium 4.1 3.5 - 5.1 mmol/L   Chloride 106 98 - 111 mmol/L   CO2 23 22 - 32 mmol/L   Glucose, Bld 213 (H) 70 - 99 mg/dL   BUN 25 (H) 6 - 20 mg/dL   Creatinine, Ser 1.46 (H) 0.44 - 1.00 mg/dL   Calcium 9.2 8.9 - 10.3 mg/dL   Total Protein 8.0 6.5 - 8.1 g/dL   Albumin 3.6 3.5 - 5.0 g/dL   AST 18 15 - 41 U/L   ALT 17 0 - 44 U/L   Alkaline Phosphatase 64 38 - 126 U/L   Total Bilirubin 0.9 0.3 - 1.2 mg/dL   GFR calc non Af Amer 42 (L) >60 mL/min   GFR calc Af Amer 48 (L) >60 mL/min   Anion gap 7 5 - 15  Troponin I - ONCE - STAT  Result Value Ref Range   Troponin I <0.03 <0.03 ng/mL  Brain natriuretic peptide  Result Value Ref Range   B Natriuretic Peptide 102.8 (H) 0.0 - 100.0 pg/mL  Urinalysis, Routine w reflex  microscopic  Result Value Ref Range   Color, Urine STRAW (A) YELLOW   APPearance CLEAR CLEAR   Specific Gravity, Urine 1.010 1.005 - 1.030   pH 6.5 5.0 - 8.0   Glucose, UA 250 (A) NEGATIVE mg/dL   Hgb urine dipstick SMALL (A) NEGATIVE   Bilirubin Urine NEGATIVE NEGATIVE   Ketones, ur NEGATIVE NEGATIVE mg/dL   Protein, ur 100 (A) NEGATIVE mg/dL   Nitrite NEGATIVE NEGATIVE   Leukocytes, UA NEGATIVE NEGATIVE  Rapid urine drug screen (hospital performed)  Result Value Ref Range   Opiates NONE DETECTED NONE DETECTED   Cocaine NONE DETECTED NONE DETECTED   Benzodiazepines NONE DETECTED NONE DETECTED   Amphetamines NONE DETECTED NONE DETECTED   Tetrahydrocannabinol NONE DETECTED NONE DETECTED   Barbiturates NONE DETECTED NONE DETECTED  Urinalysis, Microscopic  (reflex)  Result Value Ref Range   RBC / HPF 0-5 0 - 5 RBC/hpf   WBC, UA 0-5 0 - 5 WBC/hpf   Bacteria, UA NONE SEEN NONE SEEN   Squamous Epithelial / LPF 0-5 0 - 5  Influenza panel by PCR (type A & B)  Result Value Ref Range   Influenza A By PCR NEGATIVE NEGATIVE   Influenza B By PCR NEGATIVE NEGATIVE  Troponin I - Now Then Q6H  Result Value Ref Range   Troponin I <0.03 <0.03 ng/mL  Troponin I - Now Then Q6H  Result Value Ref Range   Troponin I <0.03 <0.03 ng/mL  Troponin I - Now Then Q6H  Result Value Ref Range   Troponin I <0.03 <0.03 ng/mL  Basic metabolic panel  Result Value Ref Range   Sodium 137 135 - 145 mmol/L   Potassium 4.6 3.5 - 5.1 mmol/L   Chloride 108 98 - 111 mmol/L   CO2 19 (L) 22 - 32 mmol/L   Glucose, Bld 276 (H) 70 - 99 mg/dL   BUN 20 6 - 20 mg/dL   Creatinine, Ser 1.52 (H) 0.44 - 1.00 mg/dL   Calcium 8.7 (L) 8.9 - 10.3 mg/dL   GFR calc non Af Amer 40 (L) >60 mL/min   GFR calc Af Amer 46 (L) >60 mL/min   Anion gap 10 5 - 15  CBC  Result Value Ref Range   WBC 8.1 4.0 - 10.5 K/uL   RBC 3.38 (L) 3.87 - 5.11 MIL/uL   Hemoglobin 10.4 (L) 12.0 - 15.0 g/dL   HCT 31.9 (L) 36.0 - 46.0 %   MCV 94.4 80.0 - 100.0 fL   MCH 30.8 26.0 - 34.0 pg   MCHC 32.6 30.0 - 36.0 g/dL   RDW 12.9 11.5 - 15.5 %   Platelets 258 150 - 400 K/uL   nRBC 0.0 0.0 - 0.2 %  Glucose, capillary  Result Value Ref Range   Glucose-Capillary 334 (H) 70 - 99 mg/dL  Glucose, capillary  Result Value Ref Range   Glucose-Capillary 406 (H) 70 - 99 mg/dL  Glucose, capillary  Result Value Ref Range   Glucose-Capillary 408 (H) 70 - 99 mg/dL  Basic metabolic panel  Result Value Ref Range   Sodium 137 135 - 145 mmol/L   Potassium 3.9 3.5 - 5.1 mmol/L   Chloride 104 98 - 111 mmol/L   CO2 22 22 - 32 mmol/L   Glucose, Bld 207 (H) 70 - 99 mg/dL   BUN 34 (H) 6 - 20 mg/dL   Creatinine, Ser 2.14 (H) 0.44 - 1.00 mg/dL   Calcium 9.2 8.9 - 10.3 mg/dL   GFR  calc non Af Amer 26 (L) >60 mL/min   GFR  calc Af Amer 31 (L) >60 mL/min   Anion gap 11 5 - 15  Glucose, capillary  Result Value Ref Range   Glucose-Capillary 380 (H) 70 - 99 mg/dL  Glucose, capillary  Result Value Ref Range   Glucose-Capillary 181 (H) 70 - 99 mg/dL  Sodium, urine, random  Result Value Ref Range   Sodium, Ur 46 mmol/L  Creatinine, urine, random  Result Value Ref Range   Creatinine, Urine 91.07 mg/dL  Glucose, capillary  Result Value Ref Range   Glucose-Capillary 197 (H) 70 - 99 mg/dL  Hemoglobin A1c  Result Value Ref Range   Hgb A1c MFr Bld 8.1 (H) 4.8 - 5.6 %   Mean Plasma Glucose 185.77 mg/dL  Glucose, capillary  Result Value Ref Range   Glucose-Capillary 256 (H) 70 - 99 mg/dL  Glucose, capillary  Result Value Ref Range   Glucose-Capillary 309 (H) 70 - 99 mg/dL  Basic metabolic panel  Result Value Ref Range   Sodium 137 135 - 145 mmol/L   Potassium 4.5 3.5 - 5.1 mmol/L   Chloride 106 98 - 111 mmol/L   CO2 22 22 - 32 mmol/L   Glucose, Bld 126 (H) 70 - 99 mg/dL   BUN 38 (H) 6 - 20 mg/dL   Creatinine, Ser 2.20 (H) 0.44 - 1.00 mg/dL   Calcium 8.5 (L) 8.9 - 10.3 mg/dL   GFR calc non Af Amer 25 (L) >60 mL/min   GFR calc Af Amer 30 (L) >60 mL/min   Anion gap 9 5 - 15  Glucose, capillary  Result Value Ref Range   Glucose-Capillary 155 (H) 70 - 99 mg/dL  Glucose, capillary  Result Value Ref Range   Glucose-Capillary 101 (H) 70 - 99 mg/dL  Glucose, capillary  Result Value Ref Range   Glucose-Capillary 298 (H) 70 - 99 mg/dL  Basic metabolic panel  Result Value Ref Range   Sodium 135 135 - 145 mmol/L   Potassium 4.2 3.5 - 5.1 mmol/L   Chloride 107 98 - 111 mmol/L   CO2 20 (L) 22 - 32 mmol/L   Glucose, Bld 229 (H) 70 - 99 mg/dL   BUN 40 (H) 6 - 20 mg/dL   Creatinine, Ser 2.28 (H) 0.44 - 1.00 mg/dL   Calcium 8.3 (L) 8.9 - 10.3 mg/dL   GFR calc non Af Amer 24 (L) >60 mL/min   GFR calc Af Amer 28 (L) >60 mL/min   Anion gap 8 5 - 15  Glucose, capillary  Result Value Ref Range    Glucose-Capillary 143 (H) 70 - 99 mg/dL  Basic metabolic panel  Result Value Ref Range   Sodium 136 135 - 145 mmol/L   Potassium 4.4 3.5 - 5.1 mmol/L   Chloride 108 98 - 111 mmol/L   CO2 21 (L) 22 - 32 mmol/L   Glucose, Bld 214 (H) 70 - 99 mg/dL   BUN 41 (H) 6 - 20 mg/dL   Creatinine, Ser 2.10 (H) 0.44 - 1.00 mg/dL   Calcium 8.5 (L) 8.9 - 10.3 mg/dL   GFR calc non Af Amer 27 (L) >60 mL/min   GFR calc Af Amer 31 (L) >60 mL/min   Anion gap 7 5 - 15  Glucose, capillary  Result Value Ref Range   Glucose-Capillary 188 (H) 70 - 99 mg/dL  Glucose, capillary  Result Value Ref Range   Glucose-Capillary 120 (H) 70 - 99 mg/dL  Glucose,  capillary  Result Value Ref Range   Glucose-Capillary 138 (H) 70 - 99 mg/dL  I-Stat CG4 Lactic Acid, ED  Result Value Ref Range   Lactic Acid, Venous 2.32 (HH) 0.5 - 1.9 mmol/L   Comment NOTIFIED PHYSICIAN   I-Stat venous blood gas, ED  Result Value Ref Range   pH, Ven 7.294 7.250 - 7.430   pCO2, Ven 47.9 44.0 - 60.0 mmHg   pO2, Ven 27.0 (LL) 32.0 - 45.0 mmHg   Bicarbonate 23.3 20.0 - 28.0 mmol/L   TCO2 25 22 - 32 mmol/L   O2 Saturation 43.0 %   Acid-base deficit 3.0 (H) 0.0 - 2.0 mmol/L   Patient temperature 98.6 F    Collection site IV START    Drawn by RT    Sample type VENOUS    Comment NOTIFIED PHYSICIAN   ECHOCARDIOGRAM COMPLETE  Result Value Ref Range   Weight 3,294.55 oz   Height 64 in   BP 202/113 mmHg      Assessment & Plan:   Problem List Items Addressed This Visit      Cardiovascular and Mediastinum   Malignant hypertension - Primary    Hospitalized with flash pulmonary edema, uncontrolled hypertension; treated with IV lasix; BP meds adjusted in the hospital; going to see nephrologist; keep working on weight loss, healthy eating, DASH guidelines, salt avoidance; should have BP checked in one week (here or at kidney specialist)        Endocrine   Diabetic retinopathy of both eyes associated with type 2 diabetes mellitus (HCC)  (Chronic)    Uncontrolled diabetes; foot exam by MD; check FSBS 3x a day; patient will take her meter to pharmacy for teaching      Relevant Medications   linagliptin (TRADJENTA) 5 MG TABS tablet     Other   Morbid obesity ([place])    With diabetes and asthma and HTN; important to work on weight loss      Relevant Medications   linagliptin (TRADJENTA) 5 MG TABS tablet   Insulin long-term use (HCC)    Long-term insulin use      Hemoptysis    Resolved; chest CTA done; related to flash pulmonary edema in the setting of malignant HTN       Other Visit Diagnoses    Flash pulmonary edema (HCC)       in the setting of malignant hypertension; hospitalized; chest CTA done; treated with IV lasix, BP control; currently asx   Hyponatremia       hospital labs; going to see nephro soon       Follow up plan: Return in about 1 week (around 12/09/2018) for blood pressure recheck with CMA here or at Dr. Assunta Gambles.  An after-visit summary was printed and given to the patient at [place].  Please see the patient instructions which may contain other information and recommendations beyond what is mentioned above in the assessment and plan.  Meds ordered this encounter  Medications  . linagliptin (TRADJENTA) 5 MG TABS tablet    Sig: Take 1 tablet (5 mg total) by mouth daily.    Dispense:  90 tablet    Refill:  1    No orders of the defined types were placed in this encounter.

## 2018-12-03 ENCOUNTER — Encounter: Payer: Self-pay | Admitting: Family Medicine

## 2018-12-03 ENCOUNTER — Other Ambulatory Visit: Payer: Self-pay

## 2018-12-03 MED ORDER — ATORVASTATIN CALCIUM 80 MG PO TABS: 80.0000 mg | ORAL_TABLET | Freq: Every day | ORAL | 0 refills | Status: DC

## 2018-12-03 NOTE — Progress Notes (Signed)
Lab Results  Component Value Date   CHOL 190 09/03/2018   HDL 53 09/03/2018   LDLCALC 115 (H) 09/03/2018   TRIG 109 09/03/2018   CHOLHDL 3.6 09/03/2018   Lab Results  Component Value Date   ALT 17 11/14/2018   ALT 18 02/14/2013

## 2018-12-08 DIAGNOSIS — Z794 Long term (current) use of insulin: Secondary | ICD-10-CM | POA: Insufficient documentation

## 2018-12-08 NOTE — Assessment & Plan Note (Signed)
Long-term insulin use

## 2018-12-08 NOTE — Assessment & Plan Note (Signed)
Uncontrolled diabetes; foot exam by MD; check FSBS 3x a day; patient will take her meter to pharmacy for teaching

## 2018-12-08 NOTE — Assessment & Plan Note (Signed)
With diabetes and asthma and HTN; important to work on weight loss

## 2018-12-08 NOTE — Assessment & Plan Note (Signed)
Resolved; chest CTA done; related to flash pulmonary edema in the setting of malignant HTN

## 2018-12-08 NOTE — Assessment & Plan Note (Signed)
Hospitalized with flash pulmonary edema, uncontrolled hypertension; treated with IV lasix; BP meds adjusted in the hospital; going to see nephrologist; keep working on weight loss, healthy eating, DASH guidelines, salt avoidance; should have BP checked in one week (here or at kidney specialist)

## 2018-12-09 ENCOUNTER — Ambulatory Visit: Payer: 59

## 2018-12-09 ENCOUNTER — Other Ambulatory Visit: Payer: Self-pay | Admitting: Family Medicine

## 2018-12-09 ENCOUNTER — Ambulatory Visit: Payer: Self-pay | Admitting: Pharmacist

## 2018-12-09 DIAGNOSIS — N183 Chronic kidney disease, stage 3 unspecified: Secondary | ICD-10-CM

## 2018-12-09 DIAGNOSIS — I10 Essential (primary) hypertension: Secondary | ICD-10-CM

## 2018-12-09 DIAGNOSIS — E7800 Pure hypercholesterolemia, unspecified: Secondary | ICD-10-CM

## 2018-12-09 DIAGNOSIS — E1142 Type 2 diabetes mellitus with diabetic polyneuropathy: Secondary | ICD-10-CM

## 2018-12-09 MED ORDER — METOPROLOL TARTRATE 50 MG PO TABS: 50.0000 mg | ORAL_TABLET | Freq: Two times a day (BID) | ORAL | 2 refills | Status: DC

## 2018-12-09 NOTE — Patient Instructions (Signed)
New RX sent to pharmacy Dr. Sanda Klein has increased the Metoprolol to 50mg  twice a day all others stay the same.  Return in 1 week for another blood pressure check.

## 2018-12-09 NOTE — Chronic Care Management (AMB) (Signed)
  Care Management   Note  12/09/2018 Name: Poppi Scantling MRN: [id number] DOB: Dec 18, 1970  Outreach to Ms. Goldwasser today to schedule initial care management visit. HIPAA identifiers verified. Ms. Pickeral previously consented to services on 12/02/2018  Follow up plan: Face to Face appointment with CCM team member scheduled for:  12/15/2018 at 9 AM  Ruben Reason, PharmD Clinical Pharmacist [facility] [phone]

## 2018-12-09 NOTE — Progress Notes (Signed)
Patient here for 1 week blood pressure follow-up.  Blood pressure today is 152/88 pulse 86.  She is currently taking amlodopine 10mg  qd, Metoprolol 25mg  qd, and Hydralazine 100mg  tid.  Patient denies any side effects.  Consulted with Dr. Sanda Klein and we will increase Metoprolol to 50mg  bid recheck in 1 week.

## 2018-12-12 ENCOUNTER — Encounter: Payer: Self-pay | Admitting: Family Medicine

## 2018-12-15 ENCOUNTER — Ambulatory Visit: Payer: 59

## 2018-12-15 ENCOUNTER — Ambulatory Visit: Payer: 59 | Admitting: Pharmacist

## 2018-12-15 VITALS — BP 128/70 | HR 70

## 2018-12-15 DIAGNOSIS — I10 Essential (primary) hypertension: Secondary | ICD-10-CM

## 2018-12-15 DIAGNOSIS — E1142 Type 2 diabetes mellitus with diabetic polyneuropathy: Secondary | ICD-10-CM

## 2018-12-15 NOTE — Addendum Note (Signed)
Addended by: Docia Furl on: 12/15/2018 09:14 AM   Modules accepted: Orders

## 2018-12-15 NOTE — Chronic Care Management (AMB) (Signed)
Care Management   Initial Visit Note  12/15/2018 Name: Janice Jennings MRN: [id number] DOB: [birth year]   Subjective: "Getting my blood pressure down in my main goal"  Objective:  BP Readings from Last 3 Encounters:  12/15/18 128/70  12/02/18 (!) 150/90  11/18/18 (!) 173/83    Assessment: Janice Jennings is a 47 y.o. year old female who sees Janice Jennings, Janice Anis, MD for primary care. Janice Marker, LPN asked the CCM team to consult the patient for assistance with chronic disease management related to medication management, DM management. Referral was placed 11/21/2018. Today Janice Jennings met with the CCM Team to establish health goals.   Review of patient status, including review of consultants reports, relevant laboratory and other test results, and collaboration with appropriate care team members and the patient's provider was performed as part of comprehensive patient evaluation and provision of chronic care management services.     <no information>  Goals Addressed            This Visit's Progress   . I need to get better control of my high blood pressure (pt-stated)       Janice Jennings has recently purchased a new BP monitor with a larger cuff. She has been checking her blood pressures only in the evenings. Average readings are 170s-180s/90s-100. She is taking her medication as prescribed however concerned about the back order of losartan at her pharmacy. During appointment with CCM RN CM, patient received an incoming call from her nephrologist stating losartan prescription refill had been called in but if patient is unable to obtain medication she is to notify nephrology office for a change in medication regimen. She has not been monitoring her sodium intake but very open to education and label reading. CCM RN CM suggested patient stay at 2000 mg of sodium daily.   Current Barriers:  Marland Kitchen Knowledge Deficits related to understanding basic pathophysiology of hypertension* . Knowledge Deficits  related to understanding DASH diet* . Knowledge Deficits related to lifestyle modifications for hypertension*  Nurse Case Manager Clinical Goal(s):  Marland Kitchen Over the next 14 days, patient will verbalize understanding of hypertension self care as evidenced by taking medications as prescribed, checking and recording BP as discussed, and engaging in lifestyle modifications such as DASH diet adherence and daily exercise (walking)  Interventions:  . Evaluation of current treatment plan related to hypertension* and patient's adherence to plan as established by provider. . Provided patient with written educational materials related to hypertension and stroke prevention, stroke symptoms (FAST), and DASH diet  Patient Self Care Activities:  . Self administers medications as prescribed . Attends all scheduled provider appointments . Check BP as discussed  Plan:  . RNCM will follow up with patient in 14 days to review recorded BP   *initial goal documentation     . I need to make sure I get my A1C back down" (pt-stated)       Janice Jennings just received a new glucometer. She only had today's fasting reading of 178 this morning. She is very anxious about taking her blood sugars TID as prescribed. We have compromised on every morning and three times a week post prandial. Then increase as needed. She understands that carbs can raise blood glucose levels but does not understand portion size control.  Current Barriers:  Marland Kitchen Knowledge Deficits related to effects of carbohydrates on blood sugars* . Knowledge Deficits related to benefits of exercise on managing blood glucose levels* . *  Nurse Case Manager Clinical Goal(s):  .  Over the next 14 days, patient will demonstrate improved adherence to prescribed treatment plan for DM as evidenced bychecking blood sugar levels twice daily and recording, counting carbohydrates, being more physically active, not skipping meals, and taking medications as  prescribed. .   Interventions:  . Evaluation of current treatment plan related to Diabetes* and patient's adherence to plan as established by provider. . Provided patient with written educational materials related to diabetic diet and carb counting . Reviewed scheduled/upcoming provider appointments including: PCP follow up 12/25/2018  Patient Self Care Activities:  . Self administers medications as prescribed . Attends all scheduled provider appointments . Check and record blood glucose levels as discussed . Adhere to 3-4 carb exchanges at each meal . Will not skip meals  Plan:  . RNCM will follow up with patient in 14 days to discuss blood sugar readings and diet adherance   *initial goal documentation         Follow up plan:  Telephone follow up appointment with CCM team member scheduled for: 14 days  Janice Jennings was given information about Care Management services today including:  1. Case Management services include personalized support from designated clinical staff supervised by a physician, including individualized plan of care and coordination with other care providers 2. 24/7 contact phone numbers for assistance for urgent and routine care needs. 3. The patient may stop CCM services at any time (effective at the end of the month) by phone call to the office staff.  Patient agreed to services and verbal consent obtained.    Adaeze Better E. Rollene Rotunda, RN, BSN Nurse Care Coordinator [facility] Care Management  [phone]

## 2018-12-15 NOTE — Progress Notes (Signed)
Pt is here for BP/HR check. Metoprolol was increased to 50mg  BID, denies any side effects. BP today is 128/80 and HR is 70. Consulted with Dr. Sanda Klein and she advised that patient continue current regimen.

## 2018-12-15 NOTE — Patient Instructions (Addendum)
Thank you allowing the Chronic Care Management Team to be a part of your care! It was a pleasure speaking with you today!  1. Check your blood pressure morning and evening and record 2. Check your fasting (morning) blood sugar daily and your evening sugars (2 hours after evening meal) 3 times a week 3. You have been provided Advanced Directives documents. Please fill out, have notarized and bring to office to have scanned into your chart 4. You have been provided information on the DASH diet. Following this diet will help lower your blood pressure 5. Continue to read labels for counting carbs. You can have 45-60 grams of carbs at each meal 6. Please read labels for sodium content. Try to stay at or less than 2000 mg a day. This will help reduce your BP readings. 7. Consider adding a little exercise into your daily activity. Parking farther away from buildings or taking stairs is an easy way to "exercise". 8. Please think about keeping healthy low sodium snack in your car for on the go.  CCM (Chronic Care Management) Team   Trish Fountain RN, BSN Nurse Care Coordinator  [phone]  Ruben Reason PharmD  Clinical Pharmacist  [phone]    Goals Addressed            This Visit's Progress   . I need to get better control of my high blood pressure (pt-stated)       Current Barriers:  Marland Kitchen Knowledge Deficits related to understanding basic pathophysiology of hypertension* . Knowledge Deficits related to understanding DASH diet* . Knowledge Deficits related to lifestyle modifications for hypertension*  Nurse Case Manager Clinical Goal(s):  Marland Kitchen Over the next 14 days, patient will verbalize understanding of hypertension self care as evidenced by taking medications as prescribed, checking and recording BP as discussed, and engaging in lifestyle modifications such as DASH diet adherence and daily exercise (walking)  Interventions:  . Evaluation of current treatment plan related to  hypertension* and patient's adherence to plan as established by provider. . Provided patient with written educational materials related to hypertension and stroke prevention, stroke symptoms (FAST), and DASH diet  Patient Self Care Activities:  . Self administers medications as prescribed . Attends all scheduled provider appointments . Check BP as discussed  Plan:  . RNCM will follow up with patient in 14 days to review recorded BP   *initial goal documentation     . I need to make sure I get my A1C back down" (pt-stated)       Current Barriers:  Marland Kitchen Knowledge Deficits related to effects of carbohydrates on blood sugars* . Knowledge Deficits related to benefits of exercise on managing blood glucose levels* . *  Nurse Case Manager Clinical Goal(s):  Marland Kitchen Over the next 14 days, patient will demonstrate improved adherence to prescribed treatment plan for DM as evidenced bychecking blood sugar levels twice daily and recording, counting carbohydrates, being more physically active, not skipping meals, and taking medications as prescribed. .   Interventions:  . Evaluation of current treatment plan related to Diabetes* and patient's adherence to plan as established by provider. . Provided patient with written educational materials related to diabetic diet and carb counting . Reviewed scheduled/upcoming provider appointments including: PCP follow up 12/25/2018  Patient Self Care Activities:  . Self administers medications as prescribed . Attends all scheduled provider appointments . Check and record blood glucose levels as discussed . Adhere to 3-4 carb exchanges at each meal . Will not skip meals  Plan:  . RNCM will follow up with patient in 14 days to discuss blood sugar readings and diet adherance   *initial goal documentation        Print copy of patient instructions provided.   Telephone follow up appointment with CCM team member scheduled for: 2 weeks   DASH Eating  Plan DASH stands for "Dietary Approaches to Stop Hypertension." The DASH eating plan is a healthy eating plan that has been shown to reduce high blood pressure (hypertension). It may also reduce your risk for type 2 diabetes, heart disease, and stroke. The DASH eating plan may also help with weight loss. What are tips for following this plan?  General guidelines  Avoid eating more than 2,300 mg (milligrams) of salt (sodium) a day. If you have hypertension, you may need to reduce your sodium intake to 1,500 mg a day.  Limit alcohol intake to no more than 1 drink a day for nonpregnant women and 2 drinks a day for men. One drink equals 12 oz of beer, 5 oz of wine, or 1 oz of hard liquor.  Work with your health care provider to maintain a healthy body weight or to lose weight. Ask what an ideal weight is for you.  Get at least 30 minutes of exercise that causes your heart to beat faster (aerobic exercise) most days of the week. Activities may include walking, swimming, or biking.  Work with your health care provider or diet and nutrition specialist (dietitian) to adjust your eating plan to your individual calorie needs. Reading food labels   Check food labels for the amount of sodium per serving. Choose foods with less than 5 percent of the Daily Value of sodium. Generally, foods with less than 300 mg of sodium per serving fit into this eating plan.  To find whole grains, look for the word "whole" as the first word in the ingredient list. Shopping  Buy products labeled as "low-sodium" or "no salt added."  Buy fresh foods. Avoid canned foods and premade or frozen meals. Cooking  Avoid adding salt when cooking. Use salt-free seasonings or herbs instead of table salt or sea salt. Check with your health care provider or pharmacist before using salt substitutes.  Do not fry foods. Cook foods using healthy methods such as baking, boiling, grilling, and broiling instead.  Cook with  heart-healthy oils, such as olive, canola, soybean, or sunflower oil. Meal planning  Eat a balanced diet that includes: ? 5 or more servings of fruits and vegetables each day. At each meal, try to fill half of your plate with fruits and vegetables. ? Up to 6-8 servings of whole grains each day. ? Less than 6 oz of lean meat, poultry, or fish each day. A 3-oz serving of meat is about the same size as a deck of cards. One egg equals 1 oz. ? 2 servings of low-fat dairy each day. ? A serving of nuts, seeds, or beans 5 times each week. ? Heart-healthy fats. Healthy fats called Omega-3 fatty acids are found in foods such as flaxseeds and coldwater fish, like sardines, salmon, and mackerel.  Limit how much you eat of the following: ? Canned or prepackaged foods. ? Food that is high in trans fat, such as fried foods. ? Food that is high in saturated fat, such as fatty meat. ? Sweets, desserts, sugary drinks, and other foods with added sugar. ? Full-fat dairy products.  Do not salt foods before eating.  Try to eat at least  2 vegetarian meals each week.  Eat more home-cooked food and less restaurant, buffet, and fast food.  When eating at a restaurant, ask that your food be prepared with less salt or no salt, if possible. What foods are recommended? The items listed may not be a complete list. Talk with your dietitian about what dietary choices are best for you. Grains Whole-grain or whole-wheat bread. Whole-grain or whole-wheat pasta. Brown rice. [place]. Bulgur. Whole-grain and low-sodium cereals. Pita bread. Low-fat, low-sodium crackers. Whole-wheat flour tortillas. Vegetables Fresh or frozen vegetables (raw, steamed, roasted, or grilled). Low-sodium or reduced-sodium tomato and vegetable juice. Low-sodium or reduced-sodium tomato sauce and tomato paste. Low-sodium or reduced-sodium canned vegetables. Fruits All fresh, dried, or frozen fruit. Canned fruit in natural juice (without  added sugar). Meat and other protein foods Skinless chicken or [place]. Ground chicken or [place]. Pork with fat trimmed off. Fish and seafood. Egg whites. Dried beans, peas, or lentils. Unsalted nuts, nut butters, and seeds. Unsalted canned beans. Lean cuts of beef with fat trimmed off. Low-sodium, lean deli meat. Dairy Low-fat (1%) or fat-free (skim) milk. Fat-free, low-fat, or reduced-fat cheeses. Nonfat, low-sodium ricotta or cottage cheese. Low-fat or nonfat yogurt. Low-fat, low-sodium cheese. Fats and oils Soft margarine without trans fats. Vegetable oil. Low-fat, reduced-fat, or light mayonnaise and salad dressings (reduced-sodium). Canola, safflower, olive, soybean, and sunflower oils. Avocado. Seasoning and other foods Herbs. Spices. Seasoning mixes without salt. Unsalted popcorn and pretzels. Fat-free sweets. What foods are not recommended? The items listed may not be a complete list. Talk with your dietitian about what dietary choices are best for you. Grains Baked goods made with fat, such as croissants, muffins, or some breads. Dry pasta or rice meal packs. Vegetables Creamed or fried vegetables. Vegetables in a cheese sauce. Regular canned vegetables (not low-sodium or reduced-sodium). Regular canned tomato sauce and paste (not low-sodium or reduced-sodium). Regular tomato and vegetable juice (not low-sodium or reduced-sodium). Angie Fava. Olives. Fruits Canned fruit in a light or heavy syrup. Fried fruit. Fruit in cream or butter sauce. Meat and other protein foods Fatty cuts of meat. Ribs. Fried meat. Berniece Salines. Sausage. Bologna and other processed lunch meats. Salami. Fatback. Hotdogs. Bratwurst. Salted nuts and seeds. Canned beans with added salt. Canned or smoked fish. Whole eggs or egg yolks. Chicken or [place] with skin. Dairy Whole or 2% milk, cream, and half-and-half. Whole or full-fat cream cheese. Whole-fat or sweetened yogurt. Full-fat cheese. Nondairy creamers. Whipped toppings.  Processed cheese and cheese spreads. Fats and oils Butter. Stick margarine. Lard. Shortening. Ghee. Bacon fat. Tropical oils, such as coconut, palm kernel, or palm oil. Seasoning and other foods Salted popcorn and pretzels. Onion salt, garlic salt, seasoned salt, table salt, and sea salt. Worcestershire sauce. Tartar sauce. Barbecue sauce. Teriyaki sauce. Soy sauce, including reduced-sodium. Steak sauce. Canned and packaged gravies. Fish sauce. Oyster sauce. Cocktail sauce. Horseradish that you find on the shelf. Ketchup. Mustard. Meat flavorings and tenderizers. Bouillon cubes. Hot sauce and Tabasco sauce. Premade or packaged marinades. Premade or packaged taco seasonings. Relishes. Regular salad dressings. Where to find more information:  National Heart, Lung, and [place]: https://wilson-eaton.com/  American Heart Association: www.heart.org Summary  The DASH eating plan is a healthy eating plan that has been shown to reduce high blood pressure (hypertension). It may also reduce your risk for type 2 diabetes, heart disease, and stroke.  With the DASH eating plan, you should limit salt (sodium) intake to 2,300 mg a day. If you have hypertension, you may  need to reduce your sodium intake to 1,500 mg a day.  When on the DASH eating plan, aim to eat more fresh fruits and vegetables, whole grains, lean proteins, low-fat dairy, and heart-healthy fats.  Work with your health care provider or diet and nutrition specialist (dietitian) to adjust your eating plan to your individual calorie needs. This information is not intended to replace advice given to you by your health care provider. Make sure you discuss any questions you have with your health care provider. Document Released: 10/04/2011 Document Revised: 10/08/2016 Document Reviewed: 10/08/2016 Elsevier Interactive Patient Education  2019 [employer].   Stroke Prevention Some medical conditions and behaviors are associated with a higher  chance of having a stroke. You can help prevent a stroke by making nutrition, lifestyle, and other changes, including managing any medical conditions you may have. What nutrition changes can be made?   Eat healthy foods. You can do this by: ? Choosing foods high in fiber, such as fresh fruits and vegetables and whole grains. ? Eating at least 5 or more servings of fruits and vegetables a day. Try to fill half of your plate at each meal with fruits and vegetables. ? Choosing lean protein foods, such as lean cuts of meat, poultry without skin, fish, tofu, beans, and nuts. ? Eating low-fat dairy products. ? Avoiding foods that are high in salt (sodium). This can help lower blood pressure. ? Avoiding foods that have saturated fat, trans fat, and cholesterol. This can help prevent high cholesterol. ? Avoiding processed and premade foods.  Follow your health care provider's specific guidelines for losing weight, controlling high blood pressure (hypertension), lowering high cholesterol, and managing diabetes. These may include: ? Reducing your daily calorie intake. ? Limiting your daily sodium intake to 1,500 milligrams (mg). ? Using only healthy fats for cooking, such as olive oil, canola oil, or sunflower oil. ? Counting your daily carbohydrate intake. What lifestyle changes can be made?  Maintain a healthy weight. Talk to your health care provider about your ideal weight.  Get at least 30 minutes of moderate physical activity at least 5 days a week. Moderate activity includes brisk walking, biking, and swimming.  Do not use any products that contain nicotine or tobacco, such as cigarettes and e-cigarettes. If you need help quitting, ask your health care provider. It may also be helpful to avoid exposure to secondhand smoke.  Limit alcohol intake to no more than 1 drink a day for nonpregnant women and 2 drinks a day for men. One drink equals 12 oz of beer, 5 oz of wine, or 1 oz of hard  liquor.  Stop any illegal drug use.  Avoid taking birth control pills. Talk to your health care provider about the risks of taking birth control pills if: ? You are over 75 years old. ? You smoke. ? You get migraines. ? You have ever had a blood clot. What other changes can be made?  Manage your cholesterol levels. ? Eating a healthy diet is important for preventing high cholesterol. If cholesterol cannot be managed through diet alone, you may also need to take medicines. ? Take any prescribed medicines to control your cholesterol as told by your health care provider.  Manage your diabetes. ? Eating a healthy diet and exercising regularly are important parts of managing your blood sugar. If your blood sugar cannot be managed through diet and exercise, you may need to take medicines. ? Take any prescribed medicines to control your diabetes as  told by your health care provider.  Control your hypertension. ? To reduce your risk of stroke, try to keep your blood pressure below 130/80. ? Eating a healthy diet and exercising regularly are an important part of controlling your blood pressure. If your blood pressure cannot be managed through diet and exercise, you may need to take medicines. ? Take any prescribed medicines to control hypertension as told by your health care provider. ? Ask your health care provider if you should monitor your blood pressure at home. ? Have your blood pressure checked every year, even if your blood pressure is normal. Blood pressure increases with age and some medical conditions.  Get evaluated for sleep disorders (sleep apnea). Talk to your health care provider about getting a sleep evaluation if you snore a lot or have excessive sleepiness.  Take over-the-counter and prescription medicines only as told by your health care provider. Aspirin or blood thinners (antiplatelets or anticoagulants) may be recommended to reduce your risk of forming blood clots that can  lead to stroke.  Make sure that any other medical conditions you have, such as atrial fibrillation or atherosclerosis, are managed. What are the warning signs of a stroke? The warning signs of a stroke can be easily remembered as BEFAST.  B is for balance. Signs include: ? Dizziness. ? Loss of balance or coordination. ? Sudden trouble walking.  E is for eyes. Signs include: ? A sudden change in vision. ? Trouble seeing.  F is for face. Signs include: ? Sudden weakness or numbness of the face. ? The face or eyelid drooping to one side.  A is for arms. Signs include: ? Sudden weakness or numbness of the arm, usually on one side of the body.  S is for speech. Signs include: ? Trouble speaking (aphasia). ? Trouble understanding.  T is for time. ? These symptoms may represent a serious problem that is an emergency. Do not wait to see if the symptoms will go away. Get medical help right away. Call your local emergency services (911 in the [place] Do not drive yourself to the hospital.  Other signs of stroke may include: ? A sudden, severe headache with no known cause. ? Nausea or vomiting. ? Seizure. Where to find more information For more information, visit:  American Stroke Association: www.strokeassociation.org  National Stroke Association: www.stroke.org Summary  You can prevent a stroke by eating healthy, exercising, not smoking, limiting alcohol intake, and managing any medical conditions you may have.  Do not use any products that contain nicotine or tobacco, such as cigarettes and e-cigarettes. If you need help quitting, ask your health care provider. It may also be helpful to avoid exposure to secondhand smoke.  Remember BEFAST for warning signs of stroke. Get help right away if you or a loved one has any of these signs. This information is not intended to replace advice given to you by your health care provider. Make sure you discuss any questions you have with your  health care provider. Document Released: 11/22/2004 Document Revised: 11/20/2016 Document Reviewed: 11/20/2016 Elsevier Interactive Patient Education  2019 [employer].

## 2018-12-16 ENCOUNTER — Other Ambulatory Visit: Payer: Self-pay

## 2018-12-17 MED ORDER — HYDRALAZINE HCL 100 MG PO TABS: 100.0000 mg | ORAL_TABLET | Freq: Three times a day (TID) | ORAL | 0 refills | Status: DC

## 2018-12-17 NOTE — Chronic Care Management (AMB) (Signed)
Chronic Care Management   Note  12/17/2018 Name: Janice Jennings MRN: [id number] DOB: Sep 23, 1971   Subjective:   Does the patient  feel that his/her medications are working for him/her?  yes  Has the patient been experiencing any side effects to the medications prescribed?  no  Does the patient measure his/her own blood glucose at home?  yes   Does the patient measure his/her own blood pressure at home? yes   Does the patient have any problems obtaining medications due to transportation or finances?   no  Understanding of regimen: fair Understanding of indications: good Potential of compliance: good  Objective: Lab Results  Component Value Date   CREATININE 2.10 (H) 11/18/2018   CREATININE 2.28 (H) 11/17/2018   CREATININE 2.20 (H) 11/17/2018    Lab Results  Component Value Date   HGBA1C 8.1 (H) 11/16/2018    Lipid Panel     Component Value Date/Time   CHOL 190 09/03/2018 0824   TRIG 109 09/03/2018 0824   HDL 53 09/03/2018 0824   CHOLHDL 3.6 09/03/2018 0824   VLDL 29.0 06/25/2016 1049   LDLCALC 115 (H) 09/03/2018 0824    BP Readings from Last 3 Encounters:  12/15/18 128/70  12/02/18 (!) 150/90  11/18/18 (!) 173/83    No Known Allergies  Medications Reviewed Today    Reviewed by Cathi Roan, [facility] (Pharmacist) on 12/15/18 at 0934  Med List Status: <None>  Medication Order Taking? Sig Documenting Provider Last Dose Status Informant  ACCU-CHEK SOFTCLIX LANCETS lancets [id number] Yes Check sugars TID DX:E11.42 LON:99 Lada, Satira Anis, MD Taking Active   albuterol (PROVENTIL HFA;VENTOLIN HFA) 108 (90 Base) MCG/ACT inhaler [id number] Yes Inhale 2 puffs into the lungs every 4 (four) hours as needed for wheezing or shortness of breath. Arnetha Courser, MD Taking Active Self  amLODipine (NORVASC) 10 MG tablet [id number] Yes Take 1 tablet (10 mg total) by mouth daily. Elgergawy, Silver Huguenin, MD Taking Active   aspirin EC 81 MG tablet [id number] Yes Take 81 mg by mouth  daily. [provider] Taking Active Self  atorvastatin (LIPITOR) 80 MG tablet [id number] Yes Take 1 tablet (80 mg total) by mouth daily. For cholesterol Lada, Satira Anis, MD Taking Active   ezetimibe (ZETIA) 10 MG tablet [id number] Yes Take 1 tablet (10 mg total) by mouth daily. Arnetha Courser, MD Taking Active Self  ferrous sulfate 325 (65 FE) MG tablet [id number] Yes Take 325 mg by mouth daily with breakfast. [provider] Taking Active Self  gabapentin (NEURONTIN) 300 MG capsule [id number] Yes Take 1 capsule (300 mg total) by mouth 3 (three) times daily. Arnetha Courser, MD Taking Active Self  glucose blood (ACCU-CHEK AVIVA PLUS) test strip [id number] Yes Check sugars tid DX:E11.42 LON:99 Lada, Satira Anis, MD Taking Active   glucose blood (ONE TOUCH ULTRA TEST) test strip [id number] Yes Use to check blood sugar 3 times a day. Dx:E11.42 LON:99 Lada, Satira Anis, MD Taking Active   hydrALAZINE (APRESOLINE) 100 MG tablet [id number] Yes Take 1 tablet (100 mg total) by mouth 3 (three) times daily. Elgergawy, Silver Huguenin, MD Taking Active   Insulin Glargine [facility]) 100 UNIT/ML SOPN [id number] Yes Inject 0.55 mLs (55 Units total) into the skin daily. Elgergawy, Silver Huguenin, MD Taking Active   Insulin Pen Needle 31G X 5 MM MISC [id number] Yes Use one pen needle two times daily. Fredderick Severance, NP Taking Active Self  linagliptin (TRADJENTA) 5 MG TABS tablet [id number]  Take 1 tablet (  5 mg total) by mouth daily. Arnetha Courser, MD  Active            Med Note Noel Christmas Dec 15, 2018  9:33 AM) Expensive for patient  metoprolol tartrate (LOPRESSOR) 50 MG tablet [id number] Yes Take 1 tablet (50 mg total) by mouth 2 (two) times daily. Arnetha Courser, MD Taking Active   metroNIDAZOLE (METROGEL) 0.75 % gel [id number] Yes 2 (two) times daily as needed for rash. [provider] Taking Active   Multiple Vitamin (MULTIVITAMIN) tablet [id number] Yes Take 1 tablet by mouth daily.  [provider] Taking Active Self  [place] 03T MISC [id number] Yes 1 each by Does not apply route daily. Test tid Dx:E11.42 LON:99 Lada, Satira Anis, MD Taking Active            Assessment:   Total Number of meds:  __________ (polypharmacy > 10 meds)  Indications for all medications: []  Yes       []  No  Adherence Review  []  Excellent (no doses missed/week)     []  Good (no more than 1 dose missed/week)     []  Partial (2-3 doses missed/week)     []  Poor (>3 doses missed/week)  Intervention  YES NO  Explanation   Adherence      Cannot afford medication []  []     Cannot self-administer medication appropriately []  []     Does not understand directions []  []     Prefers not to take []  []     Product unavailable []  []     Forgets to take [x]  []  Midday dose of hydralazine   Dose/Frequency      Dose form inappropriate for patient []  []     Dosing consideration [x]  []  Hydralazine TID   Different drug needed      Condition refractory to medication [x]  []  BP not at goal (<130/80 mm Hg)   More effective medication available []  []     More cost-effective medication available []  []     Other pertinent pharmacist  counseling   Provided adherence aide for patient   Goals Addressed            This Visit's Progress   . COMPLETED: I need to get better control of my high blood pressure (pt-stated)       Current Barriers:  Marland Kitchen Knowledge Deficits related to understanding basic pathophysiology of hypertension . Knowledge Deficits related to understanding DASH diet* . Knowledge Deficits related to lifestyle modifications for hypertension*  Nurse Case Manager Clinical Goal(s):  Marland Kitchen Over the next 14 days, patient will verbalize understanding of hypertension self care as evidenced by taking medications as prescribed, checking and recording BP as discussed, and engaging in lifestyle modifications such as DASH diet adherence and daily exercise (walking)  Interventions:  . Evaluation  of current treatment plan related to hypertension* and patient's adherence to plan as established by provider. . Provided patient with written educational materials related to hypertension and stroke prevention, stroke symptoms (FAST), and DASH diet  Patient Self Care Activities:  . Self administers medications as prescribed . Attends all scheduled provider appointments . Check BP as discussed  Plan:  . RNCM will follow up with patient in 14 days to review recorded BP   *initial goal documentation   Current Barriers:  Marland Kitchen Knowledge Deficits related to blood pressure and medications  Pharmacist Clinical Goal(s):  Marland Kitchen Over the next 14 days, patient will demonstrate improved understanding of prescribed medications and rationale for usage  as evidenced by adherence to medications and checking home blood pressure  Interventions: . Comprehensive medication review performed. . Advised patient to take all medications as prescribed . Collaboration with provider re: medication management  Patient Self Care Activities:  . Self administers medications as prescribed . Calls pharmacy for medication refills . Calls provider office for new concerns or questions  Plan: . PharmD willcollaborate with primary care physician regarding blood pressure regimen  *initial goal documentation         Plan: PharmD will review BP medication regimen and make recommendations for simplification to Dr. Enid Derry   Follow up in 2 weeks   Ruben Reason, PharmD Clinical Pharmacist [place] [phone]

## 2018-12-17 NOTE — Patient Instructions (Signed)
Goals Addressed            This Visit's Progress   . COMPLETED: I need to get better control of my high blood pressure (pt-stated)       Current Barriers:  Marland Kitchen Knowledge Deficits related to understanding basic pathophysiology of hypertension . Knowledge Deficits related to understanding DASH diet* . Knowledge Deficits related to lifestyle modifications for hypertension*  Nurse Case Manager Clinical Goal(s):  Marland Kitchen Over the next 14 days, patient will verbalize understanding of hypertension self care as evidenced by taking medications as prescribed, checking and recording BP as discussed, and engaging in lifestyle modifications such as DASH diet adherence and daily exercise (walking)  Interventions:  . Evaluation of current treatment plan related to hypertension* and patient's adherence to plan as established by provider. . Provided patient with written educational materials related to hypertension and stroke prevention, stroke symptoms (FAST), and DASH diet  Patient Self Care Activities:  . Self administers medications as prescribed . Attends all scheduled provider appointments . Check BP as discussed  Plan:  . RNCM will follow up with patient in 14 days to review recorded BP   *initial goal documentation   Current Barriers:  Marland Kitchen Knowledge Deficits related to blood pressure and medications  Pharmacist Clinical Goal(s):  Marland Kitchen Over the next 14 days, patient will demonstrate improved understanding of prescribed medications and rationale for usage as evidenced by adherence to medications and checking home blood pressure  Interventions: . Comprehensive medication review performed. . Advised patient to take all medications as prescribed . Collaboration with provider re: medication management  Patient Self Care Activities:  . Self administers medications as prescribed . Calls pharmacy for medication refills . Calls provider office for new concerns or questions  Plan: . PharmD  willcollaborate with primary care physician regarding blood pressure regimen  *initial goal documentation         The patient verbalized understanding of instructions provided today and declined a print copy of patient instruction materials.

## 2018-12-25 ENCOUNTER — Ambulatory Visit: Payer: 59 | Admitting: Family Medicine

## 2018-12-30 ENCOUNTER — Ambulatory Visit: Payer: Self-pay

## 2018-12-30 ENCOUNTER — Telehealth: Payer: Self-pay

## 2018-12-30 DIAGNOSIS — I10 Essential (primary) hypertension: Secondary | ICD-10-CM

## 2018-12-30 DIAGNOSIS — E1142 Type 2 diabetes mellitus with diabetic polyneuropathy: Secondary | ICD-10-CM

## 2018-12-30 NOTE — Chronic Care Management (AMB) (Signed)
   Care Management  Note 12/30/2018 Name: Janice Jennings MRN: [id number] DOB: 30-Nov-1970  Janice Jennings a 48 y.o.year old femalewho sees Lada, Satira Anis, MDfor primary care. Janice Jennings, LPNasked the CCM team to consult the patient for assistance with chronic disease management related to medication management, DM management. Referral was placed1/24/2020.  Janice Jennings met with the CCM Team to establish health goals on 12/15/2018. Today CCM RN CM made joint call with Janice Jennings to follow up on progression towards goals.    Was unable to reach patient via telephone today. I have left HIPAA compliant voicemail asking patient to return my call. (unsuccessful outreach #[phone]).   Plan: Will follow-up within 7 business days via telephone.       Shigeko Manard E. Rollene Rotunda, RN, BSN Nurse Care Coordinator [facility] Care Management  [phone]

## 2019-01-04 ENCOUNTER — Other Ambulatory Visit: Payer: Self-pay | Admitting: Nurse Practitioner

## 2019-01-04 DIAGNOSIS — I10 Essential (primary) hypertension: Secondary | ICD-10-CM

## 2019-01-05 ENCOUNTER — Other Ambulatory Visit: Payer: Self-pay | Admitting: Family Medicine

## 2019-01-06 ENCOUNTER — Telehealth: Payer: Self-pay

## 2019-01-06 MED ORDER — AMLODIPINE BESYLATE 10 MG PO TABS: 10.0000 mg | ORAL_TABLET | Freq: Every day | ORAL | 1 refills | Status: DC

## 2019-01-06 NOTE — Telephone Encounter (Signed)
Lab Results  Component Value Date   CHOL 190 09/03/2018   HDL 53 09/03/2018   LDLCALC 115 (H) 09/03/2018   TRIG 109 09/03/2018   CHOLHDL 3.6 09/03/2018   Lab Results  Component Value Date   ALT 17 11/14/2018   ALT 18 02/14/2013

## 2019-01-08 ENCOUNTER — Telehealth: Payer: Self-pay

## 2019-01-13 ENCOUNTER — Telehealth: Payer: Self-pay | Admitting: Family Medicine

## 2019-01-13 ENCOUNTER — Ambulatory Visit: Payer: Self-pay | Admitting: Pharmacist

## 2019-01-13 ENCOUNTER — Other Ambulatory Visit: Payer: Self-pay

## 2019-01-13 ENCOUNTER — Telehealth: Payer: Self-pay

## 2019-01-13 ENCOUNTER — Other Ambulatory Visit: Payer: Self-pay | Admitting: Family Medicine

## 2019-01-13 ENCOUNTER — Ambulatory Visit: Payer: Self-pay

## 2019-01-13 DIAGNOSIS — I10 Essential (primary) hypertension: Secondary | ICD-10-CM

## 2019-01-13 DIAGNOSIS — E1142 Type 2 diabetes mellitus with diabetic polyneuropathy: Secondary | ICD-10-CM

## 2019-01-13 MED ORDER — CARVEDILOL 6.25 MG PO TABS: 6.2500 mg | ORAL_TABLET | Freq: Two times a day (BID) | ORAL | 5 refills | Status: DC

## 2019-01-13 NOTE — Chronic Care Management (AMB) (Signed)
Care Management   Follow Up Note   01/13/2019 Name: Janice Jennings MRN: [id number] DOB: 18-May-1971  Referred by: Arnetha Courser, MD Reason for referral : Chronic Care Management (follow up on DM/HTN)    Subjective: "My blood pressure has come down a lot and my blood sugar numbers are better"   Objective:  BP Readings from Last 3 Encounters:  12/15/18 128/70  12/02/18 (!) 150/90  11/18/18 (!) 173/83   Lab Results  Component Value Date   HGBA1C 8.1 (H) 11/16/2018     Assessment: Janice Jennings a 48 y.o.year old femalewho sees Lada, Satira Anis, MDfor primary care. Janice Jennings, LPNasked the CCM team to consult the patient for assistance with chronic disease management related to medication management, DM management. Referral was placed1/24/2020. Janice Jennings met with the CCM Team to establish health goals on 12/15/2018.Today CCM RN CM followed up with Janice Jennings by phone to discuss progression towards goals and to address any COVID-19 questions or concerns. Education regarding social distancing and hand hygiene was also provided.  Review of patient status, including review of consultants reports, relevant laboratory and other test results, and collaboration with appropriate care team members and the patient's provider was performed as part of comprehensive patient evaluation and provision of chronic care management services.    Goals Addressed            This Visit's Progress    I need to make sure I get my A1C back down" (pt-stated)       Janice Jennings states she is doing much better with self care related to her DM. She is checking her blood sugars daily and reports readings in the low 130s. Todays fasting glucose was 133. She is watching her intake of carbohydrates and not skipping meals. She continues to work as a Education officer, museum for [employer] and using healthy low carb snacks for on the go. She is taking her medications as prescribed. She continues to check her BP daily.  SBP,140 however Monday she reports a reading of 151/84 secondary to eating corned beef the night before.   Current Barriers:   Knowledge Deficits related to effects of carbohydrates on blood sugars*  Knowledge Deficits related to benefits of exercise on managing blood glucose levels*  Nurse Case Manager Clinical Goal(s):   Over the next 30 days, patient will demonstrate improved adherence to prescribed treatment plan for DM as evidenced bychecking blood sugar levels twice daily and recording, counting carbohydrates, being more physically active, not skipping meals, and taking medications as prescribed.  Interventions:   Evaluation of current treatment plan related to Diabetes and patient's adherence to plan as established by provider and CCM RN CM.  Assessed adherence to glucose monitoring, diet modification/carb counting, daily exercise, and medication adherance  Discussed cancellation of recent appointment 12/25/2018 with PCP  Provided emotional support and reassurance related to current pandemic of COVID-19  Discussed current CDC recommendations for social distancing and hand hygiene  Patient Self Care Activities:   Self administers medications as prescribed  Reschedules follow up appointment with PCP once pandemic is cleared  Check and record blood glucose levels as discussed  Adhere to 3-4 carb exchanges at each meal  Will not skip meals-met  Plan:   RNCM will follow up with patient in 30 days to discuss blood sugar readings and diet adherance  Please see past updates in goals section for documentation of goal progression          Follow Up Plan: Will  follow up with patient in 30 days    Safia Jennings E. Rollene Rotunda, RN, BSN Nurse Care Coordinator [facility] Care Management  [phone]

## 2019-01-13 NOTE — Patient Instructions (Signed)
  Thank you allowing the Chronic Care Management Team to be a part of your care! It was a pleasure speaking with you today!  1.  2.   CCM (Chronic Care Management) Team   Trish Fountain RN, BSN Nurse Care Coordinator  [phone]  Ruben Reason PharmD  Clinical Pharmacist  [phone]   Elliot Gurney, LCSW Clinical Social Worker [phone]  Goals Addressed            This Visit's Progress   . I need to make sure I get my A1C back down" (pt-stated)       Current Barriers:  Marland Kitchen Knowledge Deficits related to effects of carbohydrates on blood sugars* . Knowledge Deficits related to benefits of exercise on managing blood glucose levels*  Nurse Case Manager Clinical Goal(s):  Marland Kitchen Over the next 30 days, patient will demonstrate improved adherence to prescribed treatment plan for DM as evidenced bychecking blood sugar levels twice daily and recording, counting carbohydrates, being more physically active, not skipping meals, and taking medications as prescribed.  Interventions:  . Evaluation of current treatment plan related to Diabetes and patient's adherence to plan as established by provider and CCM RN CM. Marland Kitchen Assessed adherence to glucose monitoring, diet modification/carb counting, daily exercise, and medication adherance . Discussed cancellation of recent appointment 12/25/2018 with PCP . Provided emotional support and reassurance related to current pandemic of COVID-19 . Discussed current CDC recommendations for social distancing and hand hygiene  Patient Self Care Activities:  . Self administers medications as prescribed . Reschedules follow up appointment with PCP once pandemic is cleared . Check and record blood glucose levels as discussed . Adhere to 3-4 carb exchanges at each meal . Will not skip meals-met  Plan:  . RNCM will follow up with patient in 30 days to discuss blood sugar readings and diet adherance  Please see past updates in goals section for  documentation of goal progression         The patient verbalized understanding of instructions provided today and declined a print copy of patient instruction materials.   Telephone follow up appointment with CCM team member scheduled for: 30 days

## 2019-01-13 NOTE — Telephone Encounter (Signed)
-----   Message from Cathi Roan, [facility] sent at 01/13/2019  9:37 AM EDT ----- Hi Dr. Sanda Klein-   I was reviewing Ms. Fuerst medicines, and I was thinking there is a lot of wiggle room in her BP regimen. She's forgetting take her mid-day dose of hydralazine frequently. Recommendations below:   - recheck kidney function and restart valsartan 160mg   Or: - switch metoprolol 25mg  BID to carvedilol 6.25mg  BID  - change hydralazine 100mg  TID to HCTZ 25mg  once daily  Please let me know your thoughts, I am happy to discuss!   Ruben Reason, PharmD Clinical Pharmacist [facility] [phone]

## 2019-01-13 NOTE — Telephone Encounter (Signed)
Patient is on lopressor 50 mg BID, not 25 mg BID PharmD says the 6.25 mg IS equivalent to 50 mg lopressor; the 25 mg was just a typo Almyra Free will talk with patient about making with switch of the beta-blockers

## 2019-01-13 NOTE — Chronic Care Management (AMB) (Signed)
  Chronic Care Management   Follow Up Note   01/13/2019 Name: Janice Jennings MRN: [id number] DOB: [birth year]  Referred by: Arnetha Courser, MD Reason for referral : Chronic Care Management and [place] is a 48 y.o. year old female who is a primary care patient of Lada, Satira Anis, MD. Dr. Sanda Klein and CCM pharmacist collaborated to make pharmacotherapy changes to [name]'s hypertension management.   Unfortunately, [name] was unavailable today for counseling from [facility] pharmacist. Left HIPAA compliant voicemail. Will outreach again in 2 days  Goals Addressed            This Visit's Progress   . COMPLETED: I need to get better control of my high blood pressure (pt-stated)       Current Barriers:  Marland Kitchen Knowledge Deficits related to understanding basic pathophysiology of hypertension . Knowledge Deficits related to understanding DASH diet* . Knowledge Deficits related to lifestyle modifications for hypertension*  Nurse Case Manager Clinical Goal(s):  Marland Kitchen Over the next 14 days, patient will verbalize understanding of hypertension self care as evidenced by taking medications as prescribed, checking and recording BP as discussed, and engaging in lifestyle modifications such as DASH diet adherence and daily exercise (walking)  Interventions:  . Evaluation of current treatment plan related to hypertension* and patient's adherence to plan as established by provider. . Provided patient with written educational materials related to hypertension and stroke prevention, stroke symptoms (FAST), and DASH diet  Patient Self Care Activities:  . Self administers medications as prescribed . Attends all scheduled provider appointments . Check BP as discussed  Plan:  . RNCM will follow up with patient in 14 days to review recorded BP   *initial goal documentation   Current Barriers:  Marland Kitchen Knowledge Deficits related to blood pressure and medications  Pharmacist Clinical  Goal(s):  Marland Kitchen Over the next 14 days, patient will demonstrate improved understanding of prescribed medications and rationale for usage as evidenced by adherence to medications and checking home blood pressure  Interventions: . Comprehensive medication review performed. . Advised patient to take all medications as prescribed . Collaboration with provider re: medication management o Recommendation: Change metoprolol 50mg  BID to carvedilol 6.25mg  BID  Patient Self Care Activities:  . Self administers medications as prescribed . Calls pharmacy for medication refills . Calls provider office for new concerns or questions  Plan: . PharmD willcollaborate with primary care physician regarding blood pressure regimen  Please see past updates related to this goal by clicking on the "Past Updates" button in the selected goal           Telephone follow up appointment with CCM team member scheduled for: 2 days  Ruben Reason, PharmD Clinical Pharmacist [facility] [phone]

## 2019-01-15 ENCOUNTER — Other Ambulatory Visit: Payer: Self-pay

## 2019-01-15 ENCOUNTER — Ambulatory Visit: Payer: Self-pay | Admitting: Pharmacist

## 2019-01-15 DIAGNOSIS — I10 Essential (primary) hypertension: Secondary | ICD-10-CM

## 2019-01-15 NOTE — Chronic Care Management (AMB) (Signed)
  Chronic Care Management   Follow Up Note   01/15/2019 Name: Shataria Crist MRN: [id number] DOB: 03-18-1971  Referred by: Arnetha Courser, MD Reason for referral : Chronic Care Management (Medication management)   Mai Longnecker is a 48 y.o. year old female who is a primary care patient of Lada, Satira Anis, MD. The CCM team was consulted for assistance with chronic disease management and care coordination needs.    Successful outreach to Mrs. Mcilhenny today to assess pharmacy goals (HTN).     Assessment:  #HTN: Dr. Sanda Klein agreed with recommendation to replace metoprolol 57m BID with carvedilol 6.259m(conversion used from COMET trial). Patient educated on purpose, proper use and potential adverse effects of carvedilol, as well as how to appropriately dispose of any remaining metoprolol tablets.    Goals Addressed            This Visit's Progress   . COMPLETED: I need to get better control of my high blood pressure (pt-stated)       Current Barriers:  . Marland Kitchennowledge Deficits related to understanding basic pathophysiology of hypertension . Knowledge Deficits related to understanding DASH diet* . Knowledge Deficits related to lifestyle modifications for hypertension*  Nurse Case Manager Clinical Goal(s):  . Marland Kitchenver the next 14 days, patient will verbalize understanding of hypertension self care as evidenced by taking medications as prescribed, checking and recording BP as discussed, and engaging in lifestyle modifications such as DASH diet adherence and daily exercise (walking)  Interventions:  . Evaluation of current treatment plan related to hypertension* and patient's adherence to plan as established by provider. . Provided patient with written educational materials related to hypertension and stroke prevention, stroke symptoms (FAST), and DASH diet  Patient Self Care Activities:  . Self administers medications as prescribed . Attends all scheduled provider appointments . Check BP as  discussed  Plan:  . RNCM will follow up with patient in 14 days to review recorded BP   *initial goal documentation   Current Barriers:  . Marland Kitchennowledge Deficits related to blood pressure and medications  Pharmacist Clinical Goal(s):  . Marland Kitchenver the next 14 days, patient will demonstrate improved understanding of prescribed medications and rationale for usage as evidenced by adherence to medications and checking home blood pressure  Interventions: . Comprehensive medication review performed. . Advised patient to take all medications as prescribed . Collaboration with provider re: medication management o Recommendation: Change metoprolol 5030mID to carvedilol 6.44m21mD o Patient has picked up carvedilol 6.25 mg BID from pharmacy  Patient Self Care Activities:  . Self administers medications as prescribed . Calls pharmacy for medication refills . Calls provider office for new concerns or questions  Plan: . PharmD willcollaborate with primary care physician regarding blood pressure regimen  Please see past updates related to this goal by clicking on the "Past Updates" button in the selected goal          Mrs. BursRieger met all clinical pharmacy goals.  Face to Face appointment with CCM team member scheduled foR:  30 days with RNCMStocktonarmD Clinical Pharmacist [facility] [phone]

## 2019-01-21 ENCOUNTER — Encounter: Payer: Self-pay | Admitting: Family Medicine

## 2019-01-23 ENCOUNTER — Other Ambulatory Visit: Payer: Self-pay

## 2019-01-26 MED ORDER — HYDRALAZINE HCL 100 MG PO TABS: 100.0000 mg | ORAL_TABLET | Freq: Three times a day (TID) | ORAL | 1 refills | Status: DC

## 2019-02-17 ENCOUNTER — Ambulatory Visit: Payer: Self-pay

## 2019-02-17 ENCOUNTER — Other Ambulatory Visit: Payer: Self-pay

## 2019-02-17 DIAGNOSIS — E1142 Type 2 diabetes mellitus with diabetic polyneuropathy: Secondary | ICD-10-CM

## 2019-02-17 DIAGNOSIS — I10 Essential (primary) hypertension: Secondary | ICD-10-CM

## 2019-02-17 NOTE — Chronic Care Management (AMB) (Signed)
Care Management   Follow Up Note   02/17/2019 Name: Janice Jennings MRN: [id number] DOB: 07-22-1971  Referred by: Arnetha Courser, MD Reason for referral : Chronic Care Management (follow up HTN, DM)    Subjective: "I am doing great" "My blood pressure and my sugars are much improved but I am not checking my BP as often as I should"  Objective:  Lab Results  Component Value Date   HGBA1C 8.1 (H) 11/16/2018   BP Readings from Last 3 Encounters:  12/15/18 128/70  12/02/18 (!) 150/90  11/18/18 (!) 173/83     Assessment: Janice Burstonis a 48 y.o.year old femalewho sees Lada, Satira Anis, MDfor primary care. Janice Jennings, LPNasked the CCM team to consult the patient for assistance with chronic disease management related to medication management, DM management. Referral was placed1/24/2020.Janice Jennings met with the CCM Team to establish health goals on 12/15/2018.Today CCM RN CM followed up with Janice Jennings by phone to discuss progression towards goals and to continue to address any COVID-19 questions or concerns as Janice Jennings is still engaging clients in the community. She is a Education officer, museum for [employer]  Review of patient status, including review of consultants reports, relevant laboratory and other test results, and collaboration with appropriate care team members and the patient's provider was performed as part of comprehensive patient evaluation and provision of chronic care management services.    Goals Addressed            This Visit's Progress   . COMPLETED: I need to make sure I get my A1C back down" (pt-stated)       Janice Jennings states she is doing well managing her diabetes. She is taking her prescribed medication although complains of her daily insulin injection "burning". She is injecting at room temp as indicated. She is checking her CBG daily and reports today fasting blood glucose as 126. Reports daily average of 125-135. She is eating more regular meals although she  still spends a good amount of time in her car. 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 She is taking precautions to prevent infection spread per her job protocol.  Current Barriers:  Marland Kitchen Knowledge Deficits related to effects of carbohydrates on blood sugars* . Knowledge Deficits related to benefits of exercise on managing blood glucose levels*  Nurse Case Manager Clinical Goal(s):  Marland Kitchen Over the next 30 days, patient will demonstrate improved adherence to prescribed treatment plan for DM as evidenced by checking blood sugar levels twice daily and recording, counting carbohydrates, being more physically active, not skipping meals, and taking medications as prescribed.  Interventions:  . Evaluation of current treatment plan related to Diabetes and patient's adherence to plan as established by provider and CCM RN CM. Marland Kitchen Assessed adherence to glucose monitoring, diet modification/carb counting, daily exercise, and medication adherance . Provided emotional support and reassurance related to current pandemic of COVID-19 . Discussed current CDC recommendations for social distancing and hand hygiene  Patient Self Care Activities:  . Self administers medications as prescribed . Reschedules follow up appointment with PCP once pandemic is cleared . Check and record blood glucose levels as discussed . Adhere to 3-4 carb exchanges at each meal . Will not skip meals-met  Plan:  . RNCM will follow up with patient in 30 days to discuss blood sugar readings and diet adherance  Please see past updates in goals section for documentation of goal progression       .  I need to get better control of my high blood pressure (pt-stated)       Janice Jennings has recently been switched from Metoprolol BID to Coreg BID. She has not been checking her BP during this change to make sure her BP is being controlled. Today, she agreed to check her BP daily over the next 7 days and be prepared  to discuss next week. She is adhering to low NA/DASH diet and trying to exercise daily as time and weather permits.   Current Barriers:  Marland Kitchen Knowledge Deficits related to understanding basic pathophysiology of hypertension . Knowledge Deficits related to understanding DASH diet* . Knowledge Deficits related to lifestyle modifications for hypertension*  Nurse Case Manager Clinical Goal(s):  Marland Kitchen Over the next 7 days, patient will check BP daily and record to insure new medication (Coreg) is controlling BP  Interventions:  . Reinforced importance of BP self monitoring in setting of medications change . Reinforced importance of low sodium/dash diet and medication adherence  Patient Self Care Activities:  . Self administers medications as prescribed . Attends all scheduled provider appointments . Check BP as discussed  Plan:  . RNCM will follow up with patient in 14 days to review recorded BP   *initial goal documentation       Follow Up Plan: Will follow up in 7 days  Shawnetta Lein E. Rollene Rotunda, RN, BSN Nurse Care Coordinator [facility] Care [facility] [phone]

## 2019-02-17 NOTE — Patient Instructions (Signed)
Thank you allowing the Chronic Care Management Team to be a part of your care! It was a pleasure speaking with you today!  1. Please continue to check your blood sugar daily, take you medications as prescribed, and adhere to an ADA diet. You have met your DM goals and I am so proud of you!! 2. Please check your BP daily for the next 7 days and record. We will discuss your readings at your next appointment. Your goal is <140/90   CCM (Chronic Care Management) Team   Trish Fountain RN, BSN Nurse Care Coordinator  [phone]  Ruben Reason PharmD  Clinical Pharmacist  [phone]   Elliot Gurney, LCSW Clinical Social Worker [phone]  Goals Addressed            This Visit's Progress   . I need to get better control of my high blood pressure (pt-stated)       Current Barriers:  Marland Kitchen Knowledge Deficits related to understanding basic pathophysiology of hypertension . Knowledge Deficits related to understanding DASH diet* . Knowledge Deficits related to lifestyle modifications for hypertension*  Nurse Case Manager Clinical Goal(s):  Marland Kitchen Over the next 7 days, patient will check BP daily and record to insure new medication (Coreg) is controlling BP  Interventions:  . Reinforced importance of BP self monitoring in setting of medications change . Reinforced importance of low sodium/dash diet and medication adherence  Patient Self Care Activities:  . Self administers medications as prescribed . Attends all scheduled provider appointments . Check BP as discussed  Plan:  . RNCM will follow up with patient in 14 days to review recorded BP   *initial goal documentation   Current Barriers:  Marland Kitchen Knowledge Deficits related to blood pressure and medications  Pharmacist Clinical Goal(s):  Marland Kitchen Over the next 14 days, patient will demonstrate improved understanding of prescribed medications and rationale for usage as evidenced by adherence to medications and checking home blood  pressure  Interventions: . Comprehensive medication review performed. . Advised patient to take all medications as prescribed . Collaboration with provider re: medication management o Recommendation: Change metoprolol 54m BID to carvedilol 6.234mBID o Patient has picked up carvedilol 6.25 mg BID from pharmacy  Patient Self Care Activities:  . Self administers medications as prescribed . Calls pharmacy for medication refills . Calls provider office for new concerns or questions  Plan: . PharmD willcollaborate with primary care physician regarding blood pressure regimen  Please see past updates related to this goal by clicking on the "Past Updates" button in the selected goal       . COMPLETED: I need to make sure I get my A1C back down" (pt-stated)       Current Barriers:  . Marland Kitchennowledge Deficits related to effects of carbohydrates on blood sugars* . Knowledge Deficits related to benefits of exercise on managing blood glucose levels*  Nurse Case Manager Clinical Goal(s):  . Marland Kitchenver the next 30 days, patient will demonstrate improved adherence to prescribed treatment plan for DM as evidenced by checking blood sugar levels twice daily and recording, counting carbohydrates, being more physically active, not skipping meals, and taking medications as prescribed.  Interventions:  . Evaluation of current treatment plan related to Diabetes and patient's adherence to plan as established by provider and CCM RN CM. . Marland Kitchenssessed adherence to glucose monitoring, diet modification/carb counting, daily exercise, and medication adherance . Provided emotional support and reassurance related to current pandemic of COVID-19 . Discussed current CDC recommendations for  social distancing and hand hygiene  Patient Self Care Activities:  . Self administers medications as prescribed . Reschedules follow up appointment with PCP once pandemic is cleared . Check and record blood glucose levels as  discussed . Adhere to 3-4 carb exchanges at each meal . Will not skip meals-met  Plan:  . RNCM will follow up with patient in 30 days to discuss blood sugar readings and diet adherance  Please see past updates in goals section for documentation of goal progression         The patient verbalized understanding of instructions provided today and declined a print copy of patient instruction materials.   Telephone follow up appointment with CCM team member scheduled for:7 days

## 2019-03-01 ENCOUNTER — Other Ambulatory Visit: Payer: Self-pay | Admitting: Family Medicine

## 2019-03-02 NOTE — Telephone Encounter (Signed)
Please schedule patient for follow up in the next 30 days.  

## 2019-03-29 ENCOUNTER — Other Ambulatory Visit: Payer: Self-pay | Admitting: Family Medicine

## 2019-03-30 NOTE — Telephone Encounter (Signed)
Please schedule patient for follow up in the next 30 days.  

## 2019-03-31 ENCOUNTER — Ambulatory Visit: Payer: 59 | Admitting: Family Medicine

## 2019-04-02 ENCOUNTER — Ambulatory Visit: Payer: 59 | Admitting: Family Medicine

## 2019-04-10 ENCOUNTER — Encounter: Payer: Self-pay | Admitting: Family Medicine

## 2019-04-10 ENCOUNTER — Other Ambulatory Visit: Payer: Self-pay

## 2019-04-10 ENCOUNTER — Ambulatory Visit (INDEPENDENT_AMBULATORY_CARE_PROVIDER_SITE_OTHER): Payer: 59 | Admitting: Family Medicine

## 2019-04-10 VITALS — BP 136/82 | HR 83 | Temp 98.1°F | Resp 16 | Ht 63.0 in | Wt 203.8 lb

## 2019-04-10 DIAGNOSIS — I10 Essential (primary) hypertension: Secondary | ICD-10-CM | POA: Diagnosis not present

## 2019-04-10 DIAGNOSIS — N183 Chronic kidney disease, stage 3 unspecified: Secondary | ICD-10-CM

## 2019-04-10 DIAGNOSIS — D649 Anemia, unspecified: Secondary | ICD-10-CM

## 2019-04-10 DIAGNOSIS — E669 Obesity, unspecified: Secondary | ICD-10-CM | POA: Diagnosis not present

## 2019-04-10 DIAGNOSIS — R252 Cramp and spasm: Secondary | ICD-10-CM

## 2019-04-10 DIAGNOSIS — E1169 Type 2 diabetes mellitus with other specified complication: Secondary | ICD-10-CM

## 2019-04-10 DIAGNOSIS — E1142 Type 2 diabetes mellitus with diabetic polyneuropathy: Secondary | ICD-10-CM | POA: Diagnosis not present

## 2019-04-10 DIAGNOSIS — E785 Hyperlipidemia, unspecified: Secondary | ICD-10-CM

## 2019-04-10 MED ORDER — GABAPENTIN 300 MG PO CAPS: ORAL_CAPSULE | ORAL | 1 refills | Status: DC

## 2019-04-10 MED ORDER — NOVOFINE 32G X 6 MM MISC: 1.0000 | Freq: Every day | 3 refills | Status: DC

## 2019-04-10 MED ORDER — TRESIBA FLEXTOUCH 100 UNIT/ML ~~LOC~~ SOPN: 50.0000 [IU] | PEN_INJECTOR | Freq: Every day | SUBCUTANEOUS | 1 refills | Status: DC

## 2019-04-10 NOTE — Progress Notes (Signed)
Name: Janice Jennings   MRN: [id number]    DOB: 1971/09/09   Date:04/10/2019       Progress Note  Subjective  Chief Complaint  Chief Complaint  Patient presents with  . Diabetes    follow up with medication refills, Insurance will not cover insulin after June 30  . Hypertension  . Hyperlipidemia    HPI  Diabetes mellitus type 2 Checking sugars?  yes How often? Daily Range (low to high) over last two weeks:  133-140; If she dips below 130 she has symptoms of hypoglycemia. Trying to limit white bread, white rice, white potatoes, sweets?  yes Trying to limit sweetened drinks like iced tea, soft drinks, sports drinks, fruit juices?  yes Checking feet every day/night?  yes Last eye exam:  January 2020; has diabetic retinopathy. Denies: Polyuria, polydipsia, polyphagia, vision changes.  Does have neuropathy in bilateral feet and toes - notes toe pain is new.  She is also getting a "charlie horse in the calf" occasionally.  She is taking gabapentin 300mg  TID - will increase evening dose to 600mg .  Most recent A1C:  Lab Results  Component Value Date   HGBA1C 8.1 (H) 11/16/2018    We will recheck today. Last CMP Results : is due for repeat today    Component Value Date/Time   NA 136 11/18/2018 0240   NA 134 (L) 02/14/2013 1416   K 4.4 11/18/2018 0240   K 4.1 02/14/2013 1416   CL 108 11/18/2018 0240   CL 101 02/14/2013 1416   CO2 21 (L) 11/18/2018 0240   CO2 25 02/14/2013 1416   GLUCOSE 214 (H) 11/18/2018 0240   GLUCOSE 349 (H) 02/14/2013 1416   BUN 41 (H) 11/18/2018 0240   BUN 15 02/14/2013 1416   CREATININE 2.10 (H) 11/18/2018 0240   CREATININE 1.34 (H) 06/19/2018 0952   CALCIUM 8.5 (L) 11/18/2018 0240   CALCIUM 9.4 02/14/2013 1416   PROT 8.0 11/14/2018 2132   PROT 8.7 (H) 02/14/2013 1416   ALBUMIN 3.6 11/14/2018 2132   ALBUMIN 3.8 02/14/2013 1416   AST 18 11/14/2018 2132   AST 13 (L) 02/14/2013 1416   ALT 17 11/14/2018 2132   ALT 18 02/14/2013 1416   ALKPHOS 64  11/14/2018 2132   ALKPHOS 77 02/14/2013 1416   BILITOT 0.9 11/14/2018 2132   BILITOT 0.6 02/14/2013 1416   GFRNONAA 27 (L) 11/18/2018 0240   GFRNONAA 46 (L) 06/19/2018 0952   GFRAA 31 (L) 11/18/2018 0240   GFRAA 54 (L) 06/19/2018 8469   Urine Micro UTD? Yes Current Medication Management:  Diabetic Medications: Taking 50 units basaglar, but insurance is no longer covering so we will switch to [place]. Also taking Tradjenta.  Consider GLP-1 therapy after labs - no history of pancreatis, no family or personal history of thyroid cancer.  ACEI/ARB: Yes Statin: Yes Aspirin therapy: Not Indicated  HTN:  -does take medications as prescribed - current regimen includes Amlodipine 10mg , Coreg 6.25mg  (BID), Hydralazine 100mg  (TID), and Losartan-HCTZ. - taking medications as instructed, no medication side effects noted, no TIAs, no chest pain on exertion, no dyspnea on exertion, noting swelling of ankles (mild, non-pitting, intermittent, goes away with elevation) - DASH diet discussed - pt does follow a low sodium diet  Hyperlipidemia: Current Medication Regimen: Atorvastatin 80mg  and Zetia Last Lipids: Lab Results  Component Value Date   CHOL 190 09/03/2018   HDL 53 09/03/2018   LDLCALC 115 (H) 09/03/2018   TRIG 109 09/03/2018   CHOLHDL 3.6  09/03/2018   - Current Diet: Working on low fat diet. - Denies: Chest pain, shortness of breath, myalgias.  Asthma: - Current Medication Regimen: Albuterol PRN; Using medications With/without: without issues - Symptoms are well controlled  - Nighttime Symptoms: no nighttime asthma symptoms - symptoms include None - Daytime Symptoms: no daytime asthma symptoms - symptoms include None - ER visits since last visit: - Missed work or school: None  Anemia/CKD III: Has been mild over the last year, we can recheck today.  She is seeing [facility] - needs to call for follow up appt. Taking iron supplement.  Obesity: Body mass index is 36.1  kg/m. Weight Management History: Diet: Balanced, low salt.  Exercise: She used to walk Mon-Fri over her lunch break, but stopped because she was not seeing results.  Co-Morbid Conditions: diabetes mellitus, dyslipidemias and hypertension; 2 or more of these conditions combined with BMI >30 is considered morbid obesity; is this diagnosis appropriate and/or added to patient's problem list? Yes  Patient Active Problem List   Diagnosis Date Noted  . Insulin long-term use ([place]) 12/08/2018  . 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 Hemoptysis 11/15/2018  . Asthma   . Acute respiratory failure with hypoxia ([place])   . Morbid obesity ([place]) 09/22/2018  . Anemia 06/19/2018  . High cholesterol 06/19/2018  . Diabetic retinopathy of both eyes associated with type 2 diabetes mellitus ([place]) 04/11/2018  . Essential hypertension 03/07/2015  . DM type 2 with diabetic peripheral neuropathy ([place]) 05/26/2014    Past Surgical History:  Procedure Laterality Date  . BREAST REDUCTION SURGERY  2001  . CESAREAN SECTION    . COLONOSCOPY WITH PROPOFOL N/A 08/15/2018   Procedure: COLONOSCOPY WITH PROPOFOL;  Surgeon: Jonathon Bellows, MD;  Location: [facility] ENDOSCOPY;  Service: Gastroenterology;  Laterality: N/A;  . ESOPHAGOGASTRODUODENOSCOPY (EGD) WITH PROPOFOL N/A 08/15/2018   Procedure: ESOPHAGOGASTRODUODENOSCOPY (EGD) WITH PROPOFOL;  Surgeon: Jonathon Bellows, MD;  Location: [facility] ENDOSCOPY;  Service: Gastroenterology;  Laterality: N/A;  . GIVENS CAPSULE STUDY N/A 09/08/2018   Procedure: GIVENS CAPSULE STUDY;  Surgeon: Jonathon Bellows, MD;  Location: [facility] ENDOSCOPY;  Service: Gastroenterology;  Laterality: N/A;  . REDUCTION MAMMAPLASTY Bilateral 2000  . TUBAL LIGATION      Family History  Problem Relation Age of Onset  . Diabetes Father   . Hyperlipidemia Father   . Hypertension Father   . Cancer Maternal Aunt 86       leukemia - died  . Diabetes Maternal Grandmother   . Cancer Maternal Grandmother        breast cancer   . Stroke Maternal Grandmother   . Breast cancer Maternal Grandmother   . Diabetes Daughter        Pre-diabetes  . Diabetes Maternal Grandfather   . Hyperlipidemia Maternal Grandfather   . Hypertension Maternal Grandfather   . Cancer Maternal Aunt 50       breast cancer - died  . Ovarian cancer Neg Hx   . Colon cancer Neg Hx     Social History   Socioeconomic History  . Marital status: Single    Spouse name: Not on file  . Number of children: 2  . Years of education: 74  . Highest education level: Not on file  Occupational History  . Occupation: Social worker at [employer]  . Financial resource strain: Not on file  . Food insecurity    Worry: Not on file    Inability: Not on  file  . Transportation needs    Medical: Not on file    Non-medical: Not on file  Tobacco Use  . Smoking status: Never Smoker  . Smokeless tobacco: Never Used  Substance and Sexual Activity  . Alcohol use: Yes    Alcohol/week: 0.0 standard drinks    Comment: occ  . Drug use: No  . Sexual activity: Not on file  Lifestyle  . Physical activity    Days per week: Not on file    Minutes per session: Not on file  . Stress: Not on file  Relationships  . Social Herbalist on phone: Not on file    Gets together: Not on file    Attends religious service: Not on file    Active member of club or organization: Not on file    Attends meetings of clubs or organizations: Not on file    Relationship status: Not on file  . Intimate partner violence    Fear of current or ex partner: Not on file    Emotionally abused: Not on file    Physically abused: Not on file    Forced sexual activity: Not on file  Other Topics Concern  . Not on file  Social History Narrative   Breindel grew up in [place], [place] and also in [place]. She attending the [employer] and obtained her Bachelors in Psychology. She then attended [employer] and obtained her Masters in Psychology. 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 She lives at home with her two children, Darius age 89 and Onsted age 67. They have a dog named Coco who she describes as having separation anxiety. Cordell enjoys activities with the children. They enjoy outdoor activities, watching movies.       Current Outpatient Medications:  .  ACCU-CHEK SOFTCLIX LANCETS lancets, Check sugars TID DX:E11.42 LON:99, Disp: 100 each, Rfl: 12 .  albuterol (PROVENTIL HFA;VENTOLIN HFA) 108 (90 Base) MCG/ACT inhaler, Inhale 2 puffs into the lungs every 4 (four) hours as needed for wheezing or shortness of breath., Disp: 1 Inhaler, Rfl: 1 .  amLODipine (NORVASC) 10 MG tablet, TAKE 1 TABLET BY MOUTH ONCE DAILY, Disp: 60 tablet, Rfl: 0 .  atorvastatin (LIPITOR) 80 MG tablet, TAKE 1 TABLET BY MOUTH  DAILY FOR CHOLESTEROL, Disp: 90 tablet, Rfl: 1 .  carvedilol (COREG) 6.25 MG tablet, Take 1 tablet (6.25 mg total) by mouth 2 (two) times daily with a meal. This replaces metoprolol (Lopressor), Disp: 60 tablet, Rfl: 5 .  ezetimibe (ZETIA) 10 MG tablet, Take 1 tablet (10 mg total) by mouth daily., Disp: 90 tablet, Rfl: 3 .  ferrous sulfate 325 (65 FE) MG tablet, Take 325 mg by mouth daily with breakfast., Disp: , Rfl:  .  gabapentin (NEURONTIN) 300 MG capsule, TAKE 1 CAPSULE BY MOUTH 3  TIMES DAILY, Disp: 270 capsule, Rfl: 1 .  glucose blood (ACCU-CHEK AVIVA PLUS) test strip, Check sugars tid DX:E11.42 LON:99, Disp: 100 each, Rfl: 12 .  hydrALAZINE (APRESOLINE) 100 MG tablet, TAKE 1 TABLET BY MOUTH 3  TIMES DAILY, Disp: 270 tablet, Rfl: 0 .  Insulin Glargine (BASAGLAR KWIKPEN) 100 UNIT/ML SOPN, INJECT SUBCUTANEOUSLY 50  UNITS DAILY AT 10 PM, Disp: 45 mL, Rfl: 1 .  Insulin Pen Needle 31G X 5 MM MISC, Use one pen needle two times daily., Disp: 200 each, Rfl: 3 .  losartan-hydrochlorothiazide (HYZAAR) 50-12.5 MG tablet, TAKE 1 TABLET BY MOUTH  DAILY, Disp: 90  tablet,  Rfl: 0 .  Multiple Vitamin (MULTIVITAMIN) tablet, Take 1 tablet by mouth daily., Disp: , Rfl:  .  TRADJENTA 5 MG TABS tablet, TAKE 1 TABLET BY MOUTH  DAILY, Disp: 90 tablet, Rfl: 0 .  aspirin EC 81 MG tablet, Take 81 mg by mouth daily., Disp: , Rfl:  .  glucose blood (ONE TOUCH ULTRA TEST) test strip, Use to check blood sugar 3 times a day. Dx:E11.42 LON:99, Disp: 100 each, Rfl: 5 .  metroNIDAZOLE (METROGEL) 0.75 % gel, 2 (two) times daily as needed for rash., Disp: , Rfl:  .  ONETOUCH DELICA LANCETS 17C MISC, 1 each by Does not apply route daily. Test tid Dx:E11.42 LON:99, Disp: 100 each, Rfl: 3  No Known Allergies  I personally reviewed active problem list, medication list, allergies, notes from last encounter, lab results with the patient/caregiver today.   ROS Constitutional: Negative for fever or weight change.  Respiratory: Negative for cough and shortness of breath.   Cardiovascular: Negative for chest pain or palpitations.  Gastrointestinal: Negative for abdominal pain, no bowel changes.  Musculoskeletal: Negative for gait problem or joint swelling.  Skin: Negative for rash.  Neurological: Negative for dizziness or headache.  No other specific complaints in a complete review of systems (except as listed in HPI above).  Objective  Vitals:   04/10/19 0725  BP: 136/82  Pulse: 83  Resp: 16  Temp: 98.1 F (36.7 C)  TempSrc: Oral  SpO2: 94%  Weight: 203 lb 12.8 oz (92.4 kg)  Height: 5\' 3"  (1.6 m)   Body mass index is 36.1 kg/m.  Physical Exam Constitutional: Patient appears well-developed and well-nourished. No distress.  HENT: Head: Normocephalic and atraumatic. Eyes: Conjunctivae and EOM are normal. No scleral icterus.  Neck: Normal range of motion. Neck supple. No JVD present. Cardiovascular: Normal rate, regular rhythm and normal heart sounds.  No murmur heard. No BLE edema. Pulmonary/Chest: Effort normal and breath sounds normal. No respiratory  distress. Musculoskeletal: Normal range of motion, no joint effusions. No gross deformities Neurological: Pt is alert and oriented to person, place, and time. No cranial nerve deficit. Coordination, balance, strength, speech and gait are normal. Bilateral feet with adequate pedal pulses, sensation, and cap refill <3 sec Skin: Skin is warm and dry. No rash noted. No erythema.  Psychiatric: Patient has a normal mood and affect. behavior is normal. Judgment and thought content normal.   No results found for this or any previous visit (from the past 72 hour(s)).   PHQ2/9: Depression screen [facility] 2/9 04/10/2019 12/02/2018 09/22/2018 06/19/2018 06/25/2016  Decreased Interest 0 0 0 0 0  Down, Depressed, Hopeless 0 0 0 0 0  PHQ - 2 Score 0 0 0 0 0  Altered sleeping 0 0 0 - -  Tired, decreased energy 0 0 0 - -  Change in appetite 0 0 0 - -  Feeling bad or failure about yourself  0 0 0 - -  Trouble concentrating 0 0 0 - -  Moving slowly or fidgety/restless 0 0 0 - -  Suicidal thoughts 0 0 0 - -  PHQ-9 Score 0 0 0 - -  Difficult doing work/chores Not difficult at all Not difficult at all Not difficult at all - -   PHQ-2/9 Result is negative.    Fall Risk: Fall Risk  04/10/2019 12/02/2018 09/22/2018 06/19/2018 06/25/2016  Falls in the past year? 0 0 0 No No  Number falls in past yr: 0 - - - -  Injury with Fall? 0 - - - -  Follow up Falls evaluation completed - - - -   Assessment & Plan  1. DM type 2 with diabetic peripheral neuropathy (HCC) - COMPLETE METABOLIC PANEL WITH GFR - Hemoglobin A1c - gabapentin (NEURONTIN) 300 MG capsule; Take 1 tablet in the morning and 1 tablet in the afternoon.  Take 2 tablets before bed.  Dispense: 360 capsule; Refill: 1 - insulin degludec (TRESIBA FLEXTOUCH) 100 UNIT/ML SOPN FlexTouch Pen; Inject 0.5 mLs (50 Units total) into the skin daily.  Dispense: 3 pen; Refill: 1  2. Essential hypertension - Continue current meds; needs to schedule follow up with [facility]  3. Morbid obesity ([place]) - Discussed importance of 150 minutes of physical activity weekly, eat two servings of fish weekly, eat one serving of tree nuts ( cashews, pistachios, pecans, almonds.Marland Kitchen) every other day, eat 6 servings of fruit/vegetables daily and drink plenty of water and avoid sweet beverages.   4. Obesity (BMI 30-39.9) - Lipid panel  5. Anemia, unspecified type - Needs follow up with [facility] with Differential/Platelet  6. Calf cramp - Stretching, exercise recommended as tolerated - COMPLETE METABOLIC PANEL WITH GFR - Magnesium  7. Hyperlipidemia associated with type 2 diabetes mellitus (HCC) - Lipid panel  8. Chronic kidney disease, stage III (moderate) (HCC) - Needs follow up with [place] GFR - CBC with Differential/Platelet

## 2019-04-11 ENCOUNTER — Other Ambulatory Visit: Payer: Self-pay | Admitting: Family Medicine

## 2019-04-11 DIAGNOSIS — E1142 Type 2 diabetes mellitus with diabetic polyneuropathy: Secondary | ICD-10-CM

## 2019-04-11 LAB — CBC WITH DIFFERENTIAL/PLATELET
Absolute Monocytes: 408 cells/uL (ref 200–950)
Basophils Absolute: 31 cells/uL (ref 0–200)
Basophils Relative: 0.6 %
Eosinophils Absolute: 128 cells/uL (ref 15–500)
Eosinophils Relative: 2.5 %
HCT: 35.3 % (ref 35.0–45.0)
Hemoglobin: 11.6 g/dL — ABNORMAL LOW (ref 11.7–15.5)
Lymphs Abs: 1387 cells/uL (ref 850–3900)
MCH: 28.9 pg (ref 27.0–33.0)
MCHC: 32.9 g/dL (ref 32.0–36.0)
MCV: 88 fL (ref 80.0–100.0)
MPV: 11.3 fL (ref 7.5–12.5)
Monocytes Relative: 8 %
Neutro Abs: 3147 cells/uL (ref 1500–7800)
Neutrophils Relative %: 61.7 %
Platelets: 316 10*3/uL (ref 140–400)
RBC: 4.01 10*6/uL (ref 3.80–5.10)
RDW: 13 % (ref 11.0–15.0)
Total Lymphocyte: 27.2 %
WBC: 5.1 10*3/uL (ref 3.8–10.8)

## 2019-04-11 LAB — COMPLETE METABOLIC PANEL WITH GFR
AG Ratio: 1.1 (calc) (ref 1.0–2.5)
ALT: 14 U/L (ref 6–29)
AST: 11 U/L (ref 10–35)
Albumin: 3.8 g/dL (ref 3.6–5.1)
Alkaline phosphatase (APISO): 87 U/L (ref 37–153)
BUN/Creatinine Ratio: 18 (calc) (ref 6–22)
BUN: 37 mg/dL — ABNORMAL HIGH (ref 7–25)
CO2: 27 mmol/L (ref 20–32)
Calcium: 9.7 mg/dL (ref 8.6–10.4)
Chloride: 102 mmol/L (ref 98–110)
Creat: 2.01 mg/dL — ABNORMAL HIGH (ref 0.50–1.05)
GFR, Est African American: 33 mL/min/{1.73_m2} — ABNORMAL LOW (ref >=60)
GFR, Est Non African American: 28 mL/min/{1.73_m2} — ABNORMAL LOW (ref >=60)
Globulin: 3.5 g/dL (calc) (ref 1.9–3.7)
Glucose, Bld: 247 mg/dL — ABNORMAL HIGH (ref 65–99)
Potassium: 4.1 mmol/L (ref 3.5–5.3)
Sodium: 138 mmol/L (ref 135–146)
Total Bilirubin: 0.4 mg/dL (ref 0.2–1.2)
Total Protein: 7.3 g/dL (ref 6.1–8.1)

## 2019-04-11 LAB — LIPID PANEL
Cholesterol: 178 mg/dL (ref <200)
HDL: 51 mg/dL (ref >=50)
LDL Cholesterol (Calc): 109 mg/dL (calc) — ABNORMAL HIGH
Non-HDL Cholesterol (Calc): 127 mg/dL (calc) (ref <130)
Total CHOL/HDL Ratio: 3.5 (calc) (ref <5.0)
Triglycerides: 86 mg/dL (ref <150)

## 2019-04-11 LAB — HEMOGLOBIN A1C
Hgb A1c MFr Bld: 9.5 % of total Hgb — ABNORMAL HIGH (ref <5.7)
Mean Plasma Glucose: 226 (calc)
eAG (mmol/L): 12.5 (calc)

## 2019-04-11 LAB — MAGNESIUM: Magnesium: 2.1 mg/dL (ref 1.5–2.5)

## 2019-04-11 MED ORDER — OZEMPIC (0.25 OR 0.5 MG/DOSE) 2 MG/1.5ML ~~LOC~~ SOPN: PEN_INJECTOR | SUBCUTANEOUS | 2 refills | Status: DC

## 2019-05-02 IMAGING — CR DG CHEST 2V
2 series · 2 of 2 positions shown · non-contrast
Comparison: CTA chest 09/17/2017 and earlier.

CLINICAL DATA: 47-year-old female with shortness of breath, cough
and hemoptysis for 3 hours.

EXAM:
CHEST - 2 VIEW

[w chest pa]
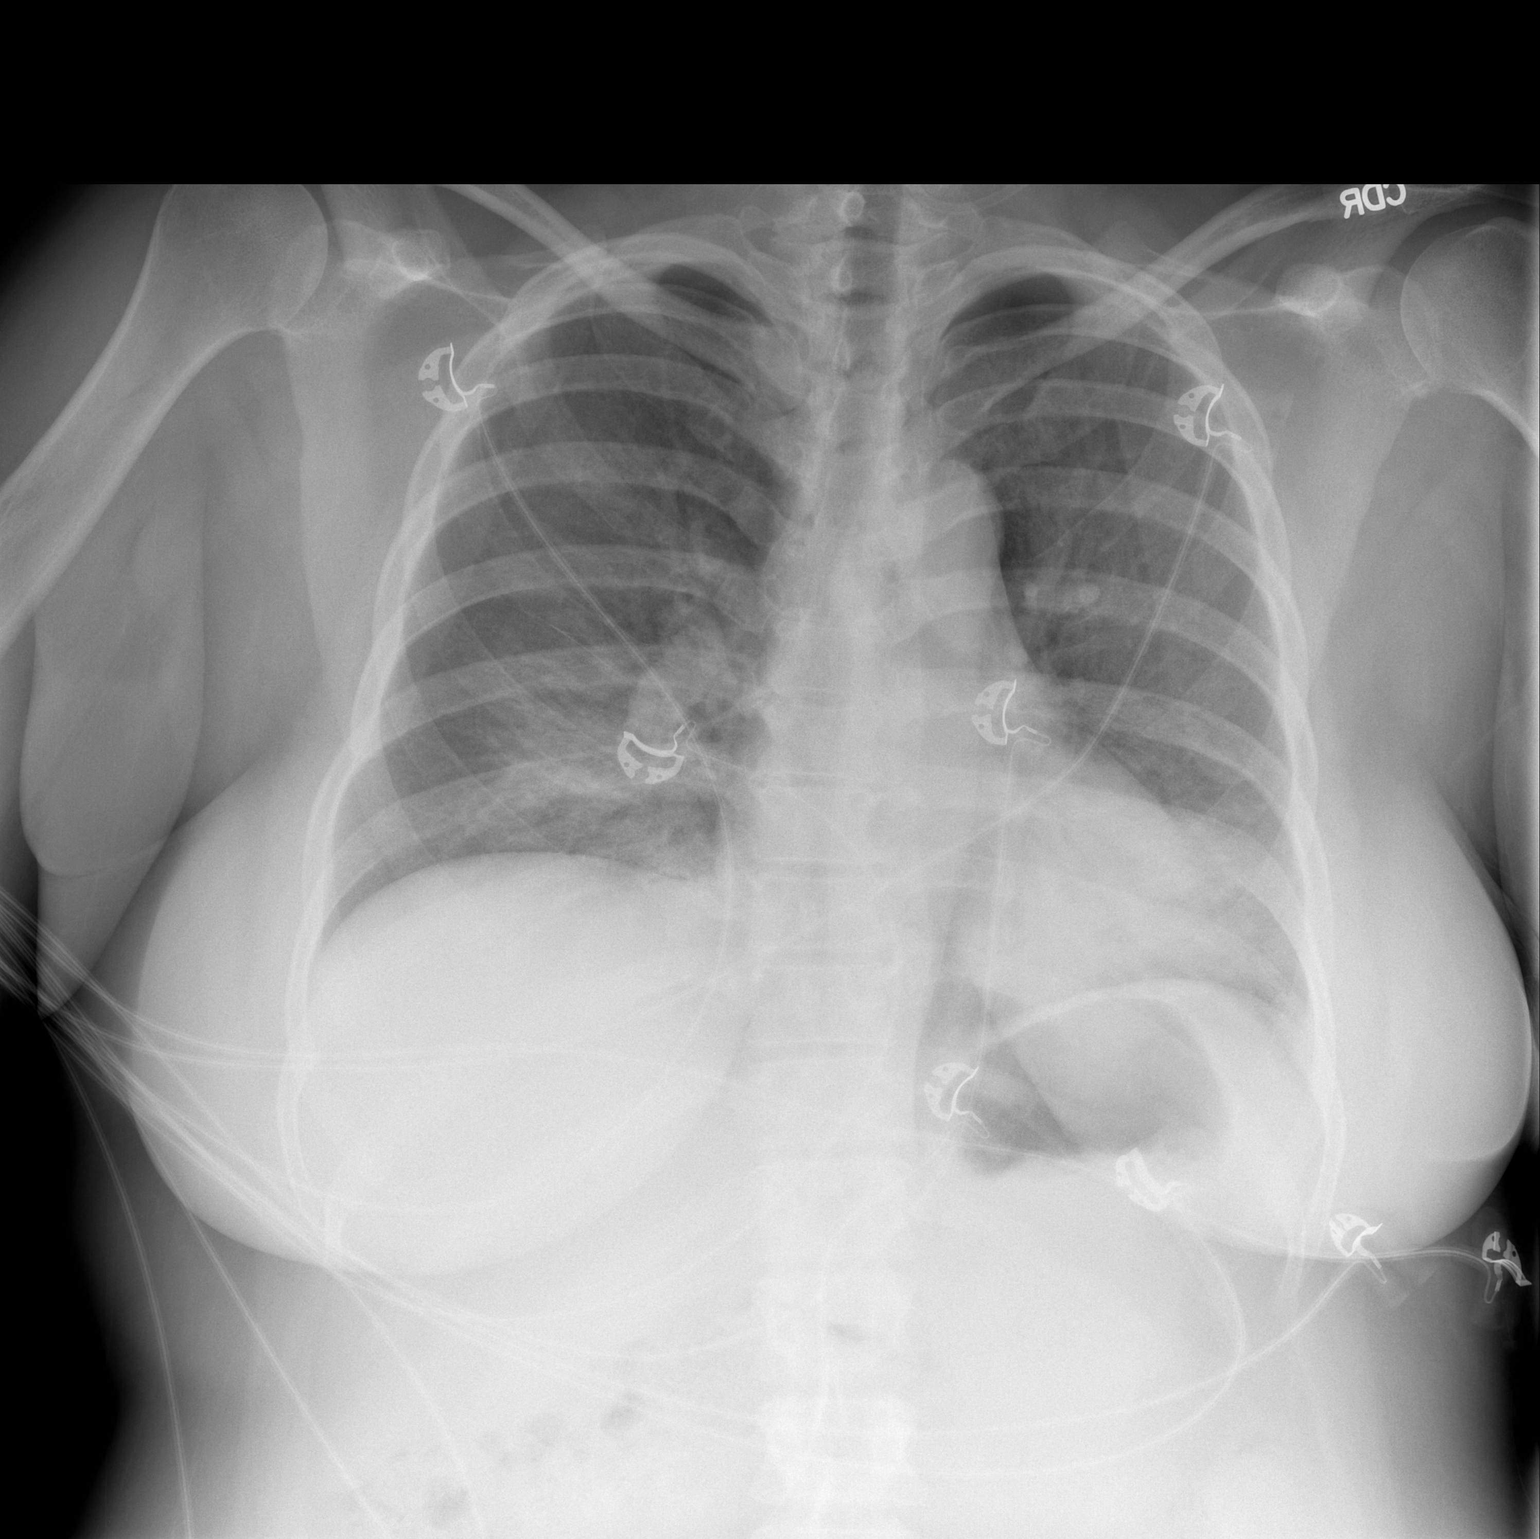

[w chest lat]
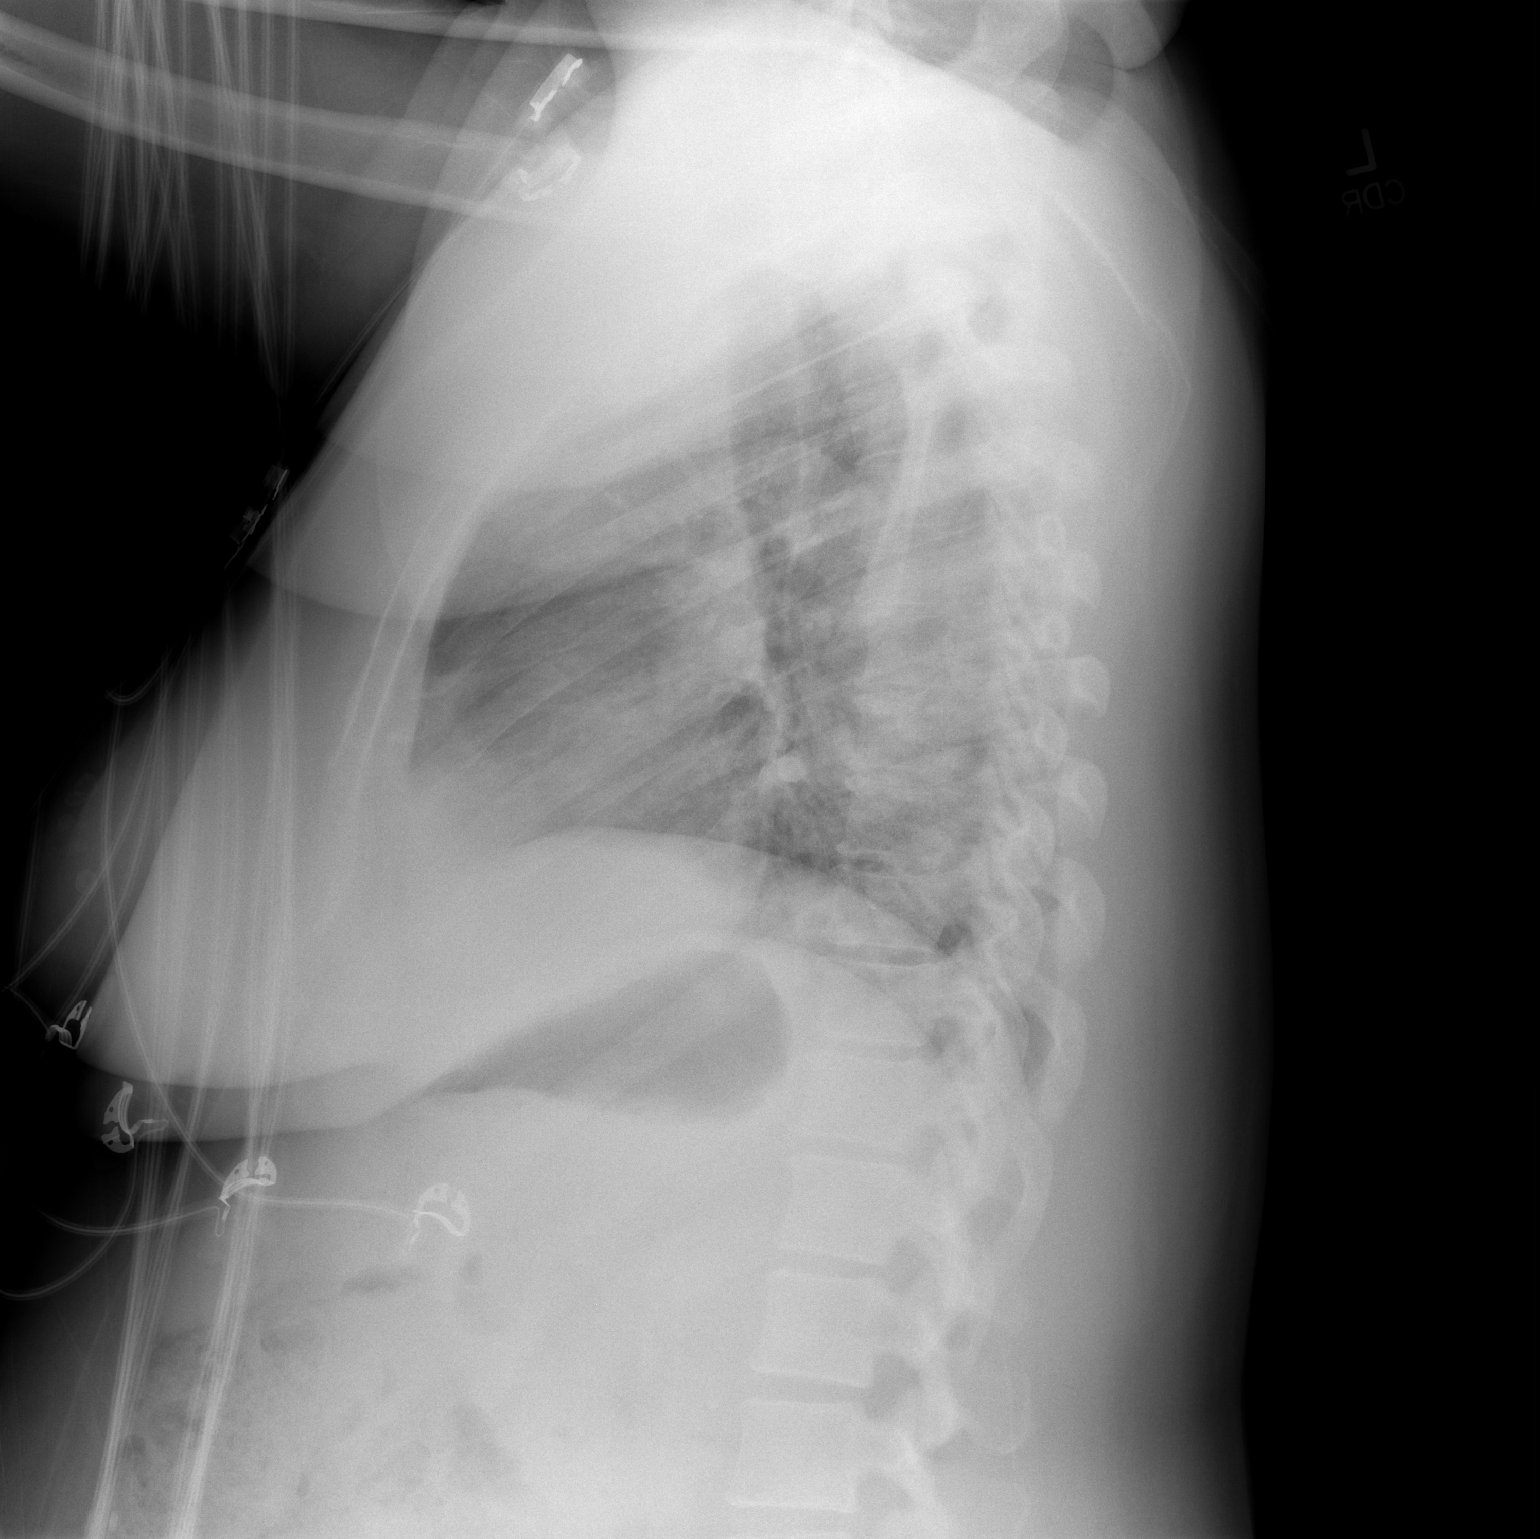

[2 of 2 positions shown; findings below may reference images not displayed]

FINDINGS: Confluent left greater than right lower lung opacity. Air
bronchograms on the left. No pleural effusion or pneumothorax.
Pulmonary vascularity remains normal. Normal cardiac size and
mediastinal contours. Visualized tracheal air column is within
normal limits. No acute osseous abnormality identified. Negative
visible bowel gas pattern.
IMPRESSION: Left greater than right lower lung consolidation which is
nonspecific. Top considerations in this setting include pneumonia
and pulmonary hemorrhage. No pleural effusion.

## 2019-05-02 IMAGING — CT CT ANGIO CHEST
2 of 8 series · 19 of 36 positions shown · IV contrast (iopamidol)
Comparison: Chest radiograph dated 11/14/2018 and CT dated
11/17/2016

CLINICAL DATA: 47-year-old female with cough and shortness of
breath.

EXAM:
CT ANGIOGRAPHY CHEST WITH CONTRAST
TECHNIQUE: Multidetector CT imaging of the chest was performed using the
standard protocol during bolus administration of intravenous
contrast. Multiplanar CT image reconstructions and MIPs were
obtained to evaluate the vascular anatomy.
CONTRAST:  100mL 6PWLKV-NMR IOPAMIDOL (6PWLKV-NMR) INJECTION 76%

[Series 6: pe coronal mpr · coronal · 0.50mm/px · 1 of 132 slices shown]
[im 66/132  mediastinal]
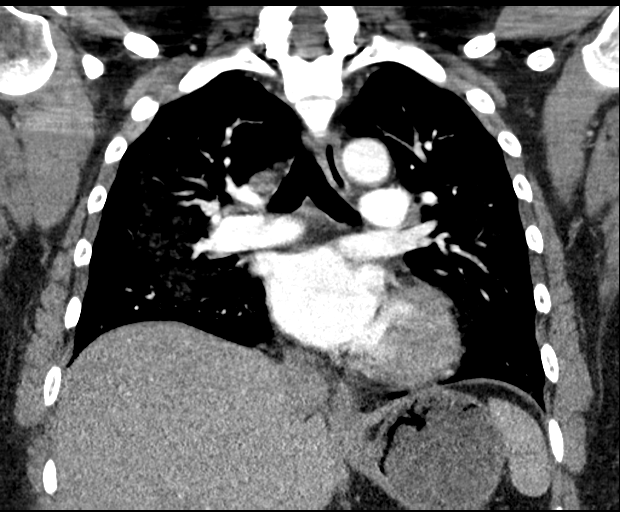

[Series 10: pe thins · axial · 0.65mm/px · z∈[-189,+33]mm · 18 of 250 slices shown]
[im 14/250  lung]
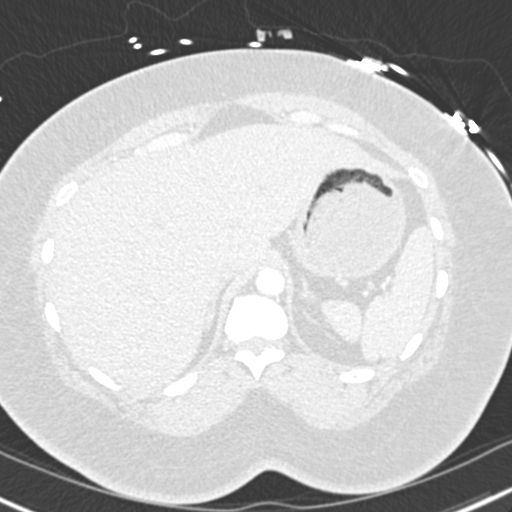
[im 27/250  mediastinal]
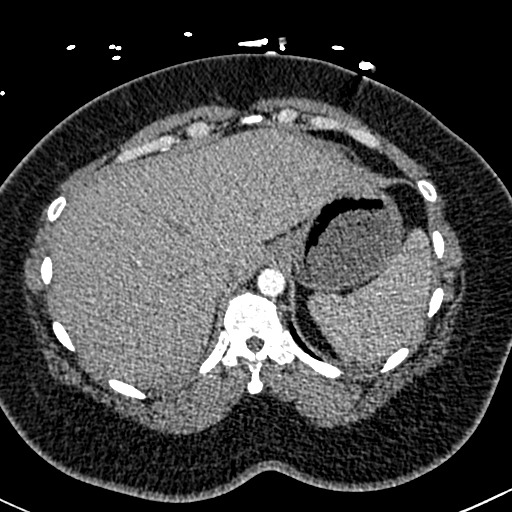
[im 40/250  lung]
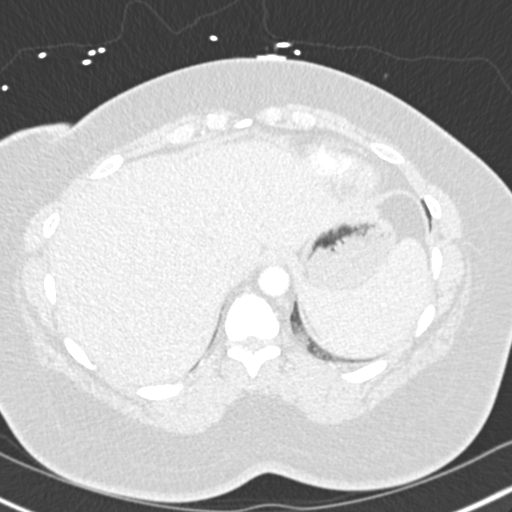
[im 53/250  mediastinal]
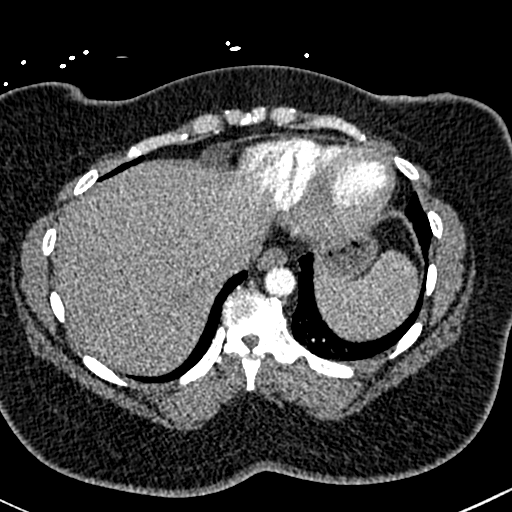
[im 66/250  lung]
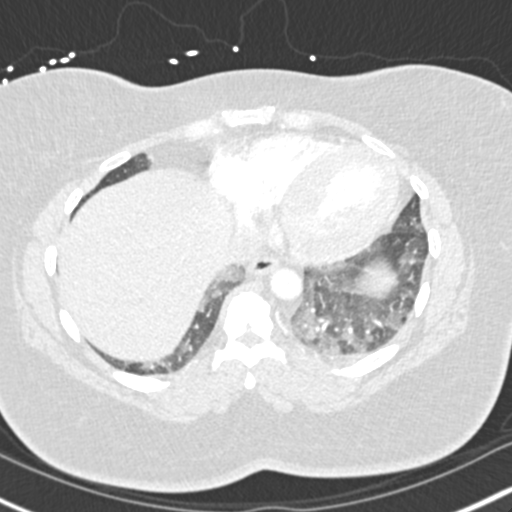
[im 79/250  mediastinal]
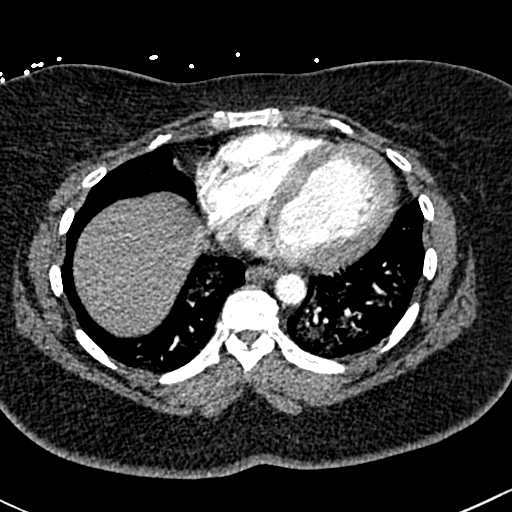
[im 92/250  lung]
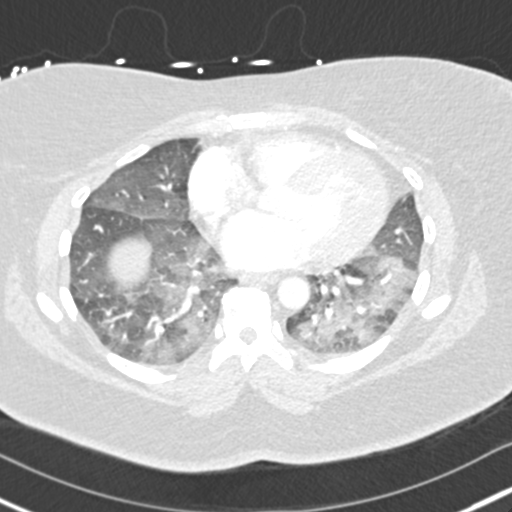
[im 105/250  mediastinal]
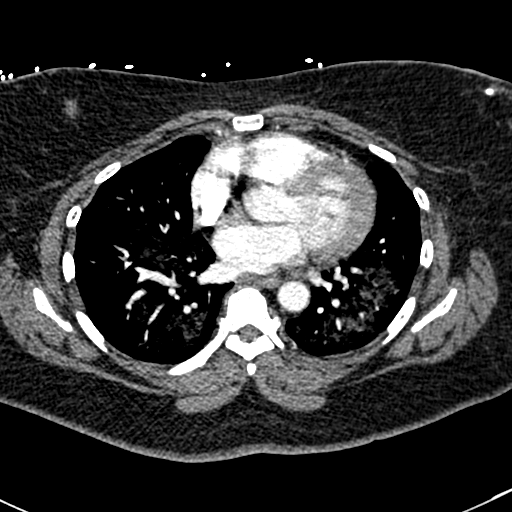
[im 118/250  lung]
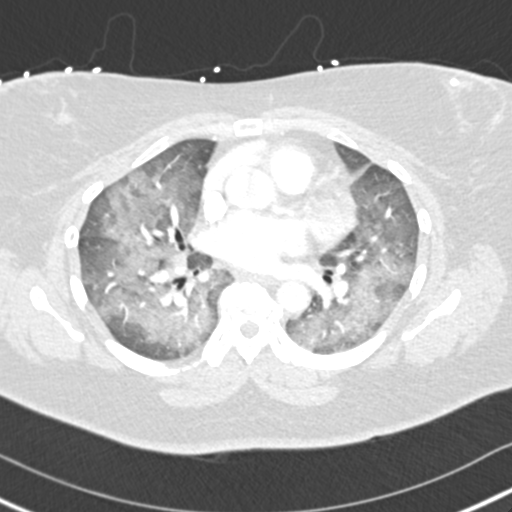
[im 132/250  mediastinal]
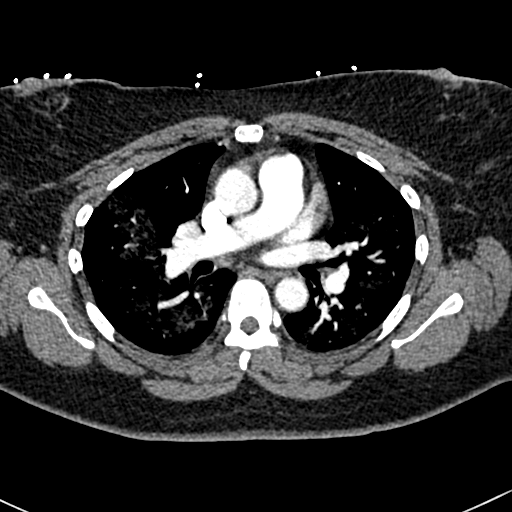
[im 145/250  lung]
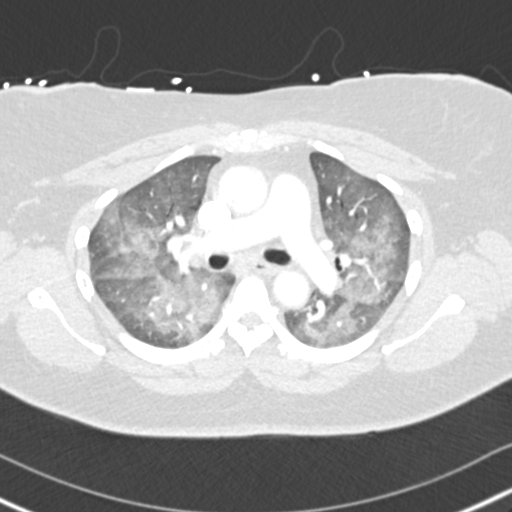
[im 158/250  mediastinal]
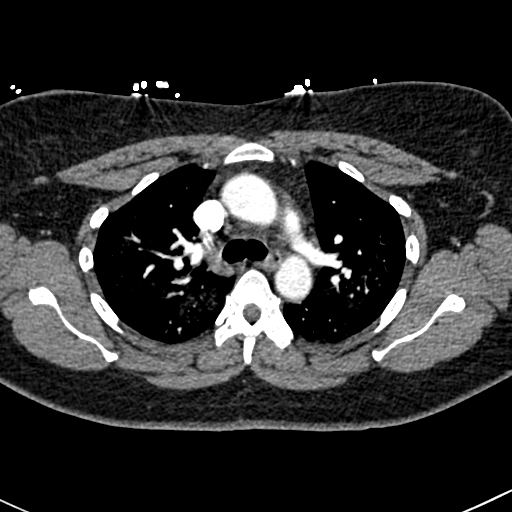
[im 171/250  lung]
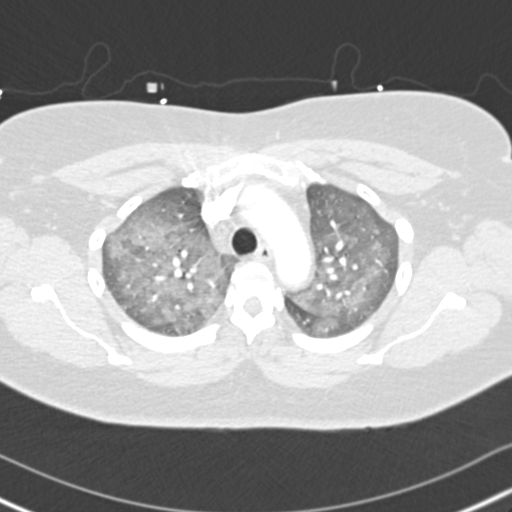
[im 184/250  mediastinal]
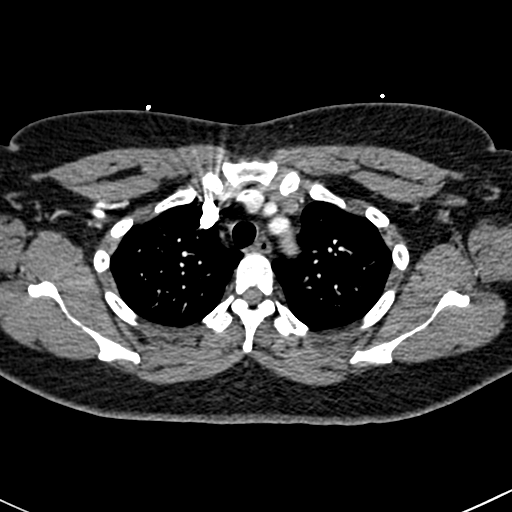
[im 197/250  lung]
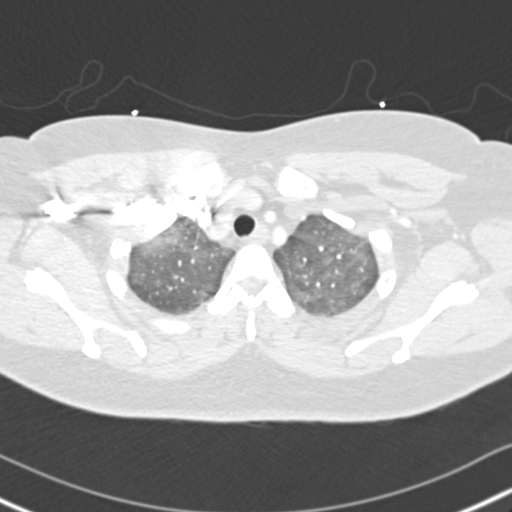
[im 210/250  mediastinal]
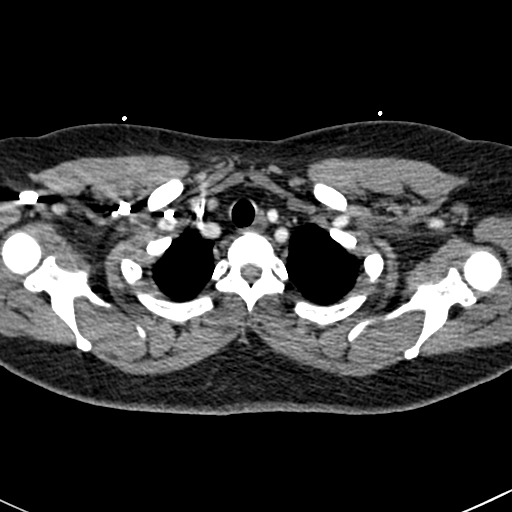
[im 223/250  lung]
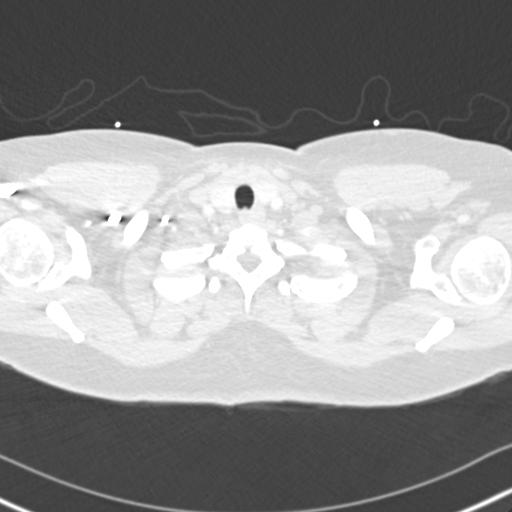
[im 236/250  mediastinal]
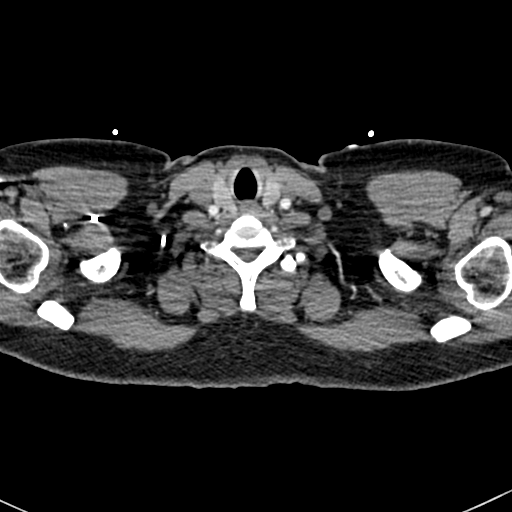

[19 of 36 positions shown; findings below may reference images not displayed]

FINDINGS: Cardiovascular: Borderline cardiomegaly. No pericardial effusion.
The thoracic aorta is unremarkable. The origins of the great vessels
of the aortic arch appear patent as visualized. There is no CT
evidence of pulmonary embolism.

Mediastinum/Nodes: No hilar or mediastinal adenopathy. Esophagus and
the thyroid gland are grossly unremarkable. No mediastinal fluid
collection.

Lungs/Pleura: Diffuse airspace opacity may represent pulmonary
edema, ARDS, or pneumonia. Clinical correlation is recommended.
There is no pleural effusion or pneumothorax. The central airways
are patent.

Upper Abdomen: No acute abnormality.

Musculoskeletal: No chest wall abnormality. No acute or significant
osseous findings.

Review of the MIP images confirms the above findings.
IMPRESSION: 1. No CT evidence of pulmonary embolism.
2. Diffuse airspace opacity may represent pulmonary edema, ARDS, or
pneumonia. Clinical correlation is recommended.

## 2019-05-14 ENCOUNTER — Other Ambulatory Visit: Payer: Self-pay | Admitting: Family Medicine

## 2019-05-22 ENCOUNTER — Other Ambulatory Visit: Payer: Self-pay | Admitting: Family Medicine

## 2019-05-22 ENCOUNTER — Encounter: Payer: Self-pay | Admitting: Family Medicine

## 2019-05-22 DIAGNOSIS — E1142 Type 2 diabetes mellitus with diabetic polyneuropathy: Secondary | ICD-10-CM

## 2019-05-28 ENCOUNTER — Other Ambulatory Visit: Payer: Self-pay | Admitting: Family Medicine

## 2019-05-28 DIAGNOSIS — E1142 Type 2 diabetes mellitus with diabetic polyneuropathy: Secondary | ICD-10-CM

## 2019-05-28 MED ORDER — LANTUS SOLOSTAR 100 UNIT/ML ~~LOC~~ SOPN: 50.0000 [IU] | PEN_INJECTOR | Freq: Every day | SUBCUTANEOUS | 3 refills | Status: DC

## 2019-05-28 NOTE — Progress Notes (Signed)
Received denial of tresiba; will write for lantus.  May switch over unit for unit.

## 2019-05-29 ENCOUNTER — Other Ambulatory Visit: Payer: Self-pay | Admitting: Family Medicine

## 2019-06-02 ENCOUNTER — Other Ambulatory Visit: Payer: Self-pay

## 2019-06-02 DIAGNOSIS — E1142 Type 2 diabetes mellitus with diabetic polyneuropathy: Secondary | ICD-10-CM

## 2019-06-02 MED ORDER — OZEMPIC (0.25 OR 0.5 MG/DOSE) 2 MG/1.5ML ~~LOC~~ SOPN: 0.5000 mg | PEN_INJECTOR | SUBCUTANEOUS | 1 refills | Status: AC

## 2019-06-03 ENCOUNTER — Other Ambulatory Visit: Payer: Self-pay

## 2019-06-03 MED ORDER — EZETIMIBE 10 MG PO TABS: 10.0000 mg | ORAL_TABLET | Freq: Every day | ORAL | 3 refills | Status: DC

## 2019-06-03 MED ORDER — CARVEDILOL 6.25 MG PO TABS: 6.2500 mg | ORAL_TABLET | Freq: Two times a day (BID) | ORAL | 5 refills | Status: DC

## 2019-06-15 ENCOUNTER — Other Ambulatory Visit: Payer: Self-pay | Admitting: Family Medicine

## 2019-06-16 NOTE — Telephone Encounter (Signed)
Please advise if Raquel Sarna will be taking over this rx?

## 2019-06-18 ENCOUNTER — Encounter: Payer: Self-pay | Admitting: Family Medicine

## 2019-07-01 ENCOUNTER — Other Ambulatory Visit: Payer: Self-pay | Admitting: Emergency Medicine

## 2019-07-01 MED ORDER — ATORVASTATIN CALCIUM 80 MG PO TABS: ORAL_TABLET | ORAL | 1 refills | Status: DC

## 2019-07-01 NOTE — Telephone Encounter (Signed)
PT said that she was driving and that she would call back to sch appt last of sept

## 2019-07-01 NOTE — Telephone Encounter (Signed)
Please make sure patient has 63month follow up from her June appt scheduled. Thanks!

## 2019-07-08 NOTE — Telephone Encounter (Signed)
lvm about this message

## 2019-07-25 ENCOUNTER — Other Ambulatory Visit: Payer: Self-pay | Admitting: Family Medicine

## 2019-07-27 NOTE — Telephone Encounter (Signed)
Lvm to schedule appt.

## 2019-07-27 NOTE — Telephone Encounter (Signed)
Requested medication (s) are due for refill today: yes  Requested medication (s) are on the active medication list: yes  Last refill: 05/31/2019  Future visit scheduled: no  Notes to clinic:  Requesting 1 year supply   Requested Prescriptions  Pending Prescriptions Disp Refills   losartan-hydrochlorothiazide (HYZAAR) 50-12.5 MG tablet [Pharmacy Med Name: LOSARTAN/HCTZ 50-12.5MG  TABLET] 90 tablet 3    Sig: TAKE 1 TABLET BY MOUTH  DAILY     Cardiovascular: ARB + Diuretic Combos Failed - 07/25/2019 10:34 PM      Failed - Cr in normal range and within 180 days    Creat  Date Value Ref Range Status  04/10/2019 2.01 (H) 0.50 - 1.05 mg/dL Final    Comment:    For patients >58 years of age, the reference limit for Creatinine is approximately 13% higher for people identified as African-American. .          Passed - K in normal range and within 180 days    Potassium  Date Value Ref Range Status  04/10/2019 4.1 3.5 - 5.3 mmol/L Final  02/14/2013 4.1 3.5 - 5.1 mmol/L Final         Passed - Na in normal range and within 180 days    Sodium  Date Value Ref Range Status  04/10/2019 138 135 - 146 mmol/L Final  02/14/2013 134 (L) 136 - 145 mmol/L Final         Passed - Ca in normal range and within 180 days    Calcium  Date Value Ref Range Status  04/10/2019 9.7 8.6 - 10.4 mg/dL Final   Calcium, Total  Date Value Ref Range Status  02/14/2013 9.4 8.5 - 10.1 mg/dL Final         Passed - Patient is not pregnant      Passed - Last BP in normal range    BP Readings from Last 1 Encounters:  04/10/19 136/82         Passed - Valid encounter within last 6 months    Recent Outpatient Visits          3 months ago DM type 2 with diabetic peripheral neuropathy [facility])   [facility]orn, FNP   7 months ago Malignant hypertension   [facility] [place], Satira Anis, MD   10 months ago DM type 2 with diabetic peripheral neuropathy [facility])   [place], Satira Anis, MD   1 year ago DM type 2 with diabetic peripheral neuropathy [facility])   [place], Satira Anis, MD   4 years ago High risk heterosexual behavior   [place], Velora Heckler, [place]

## 2019-07-27 NOTE — Telephone Encounter (Signed)
Please schedule patient for follow up in the next 30 days.  

## 2019-08-10 ENCOUNTER — Other Ambulatory Visit: Payer: Self-pay

## 2019-08-10 ENCOUNTER — Ambulatory Visit (INDEPENDENT_AMBULATORY_CARE_PROVIDER_SITE_OTHER): Payer: 59 | Admitting: Family Medicine

## 2019-08-10 ENCOUNTER — Encounter: Payer: Self-pay | Admitting: Family Medicine

## 2019-08-10 VITALS — BP 128/82 | HR 89 | Temp 98.0°F | Resp 16 | Ht 63.0 in | Wt 201.4 lb

## 2019-08-10 DIAGNOSIS — E113393 Type 2 diabetes mellitus with moderate nonproliferative diabetic retinopathy without macular edema, bilateral: Secondary | ICD-10-CM | POA: Diagnosis not present

## 2019-08-10 DIAGNOSIS — M2550 Pain in unspecified joint: Secondary | ICD-10-CM | POA: Diagnosis not present

## 2019-08-10 DIAGNOSIS — J4520 Mild intermittent asthma, uncomplicated: Secondary | ICD-10-CM

## 2019-08-10 DIAGNOSIS — N1832 Chronic kidney disease, stage 3b: Secondary | ICD-10-CM

## 2019-08-10 DIAGNOSIS — Z1231 Encounter for screening mammogram for malignant neoplasm of breast: Secondary | ICD-10-CM

## 2019-08-10 DIAGNOSIS — E1142 Type 2 diabetes mellitus with diabetic polyneuropathy: Secondary | ICD-10-CM

## 2019-08-10 DIAGNOSIS — D649 Anemia, unspecified: Secondary | ICD-10-CM

## 2019-08-10 DIAGNOSIS — Z23 Encounter for immunization: Secondary | ICD-10-CM

## 2019-08-10 DIAGNOSIS — E1169 Type 2 diabetes mellitus with other specified complication: Secondary | ICD-10-CM

## 2019-08-10 DIAGNOSIS — N951 Menopausal and female climacteric states: Secondary | ICD-10-CM | POA: Diagnosis not present

## 2019-08-10 DIAGNOSIS — E785 Hyperlipidemia, unspecified: Secondary | ICD-10-CM

## 2019-08-10 DIAGNOSIS — I10 Essential (primary) hypertension: Secondary | ICD-10-CM

## 2019-08-10 DIAGNOSIS — M2560 Stiffness of unspecified joint, not elsewhere classified: Secondary | ICD-10-CM

## 2019-08-10 MED ORDER — VENLAFAXINE HCL ER 37.5 MG PO CP24: 37.5000 mg | ORAL_CAPSULE | Freq: Every day | ORAL | 1 refills | Status: DC

## 2019-08-10 MED ORDER — GABAPENTIN 300 MG PO CAPS: ORAL_CAPSULE | ORAL | 1 refills | Status: DC

## 2019-08-10 NOTE — Progress Notes (Signed)
Name: Janice Jennings   MRN: [id number]    DOB: [birth year]   Date:08/10/2019       Progress Note  Subjective  Chief Complaint  Chief Complaint  Patient presents with  . Diabetes    3 month recheck, medication refill BS 130  . Hypertension  . Hyperlipidemia    HPI  Diabetes mellitus type 2 Checking sugars?  yes How often? Daily Range (low to high) over last two weeks:  Highest was 137; lowest was 122 (felt a bit shaky at this level) in the last 2 weeks Trying to limit white bread, white rice, white potatoes, sweets?  yes Trying to limit sweetened drinks like iced tea, soft drinks, sports drinks, fruit juices?  yes Checking feet every day/night?  yes  Last eye exam:  January 2020; has diabetic retinopathy. Janice Jennings did have laser surgery on bilateral eyes recently to stop some bleeding blood vessels.  Denies: Polyuria, polydipsia, polyphagia.  Does have neuropathy in bilateral feet and toes.  Janice Jennings is also getting a "charlie horse in the calf" occasionally.  Janice Jennings is taking gabapentin - we increased to 600mg  QHS and 300mg  morning and afternoon Most recent A1C:  Lab Results  Component Value Date   HGBA1C 9.5 (H) 04/10/2019  We will recheck today. Last CMP Results : is due for repeat today Urine Micro UTD? No Current Medication Management:  Diabetic Medications: Taking 50 units tresiba, doing well on Ozempic, but has been late on a few doses.  ACEI/ARB: Yes Statin: Yes  Aspirin therapy: Taking 81mg  daily.  Hot Flashes: Janice Jennings is having hot flashes throughout the day and overnight that have been worsening lately - worse at night.  Janice Jennings will sometimes have to change her nightgown because Janice Jennings is sweating.  Janice Jennings is perimenopausal - Janice Jennings had 1 menses in May 2020.  We will check thyroid today.  Will start at 37.5mg  Effexor - Janice Jennings will message in 4-6 weeks if not working and we will consider increasing to 75mg .   Bilateral hand stiffness/Arthralgias: Janice Jennings notes when the weather changes Janice Jennings has  bilateral hand stiffness, LEFT hip pain and stiffness, and bilateral toe stiffness.  Janice Jennings denies swelling, erythema or color changes to the joints.  Discussed checking labs today - we will check sed rate/CRP.  HTN:  -does take medications as prescribed - current regimen includes Amlodipine 10mg , Coreg 6.25mg  (BID), Hydralazine 100mg  (TID), and Losartan-HCTZ. - taking medications as instructed, no medication side effects noted, no TIAs, no chest pain on exertion, no dyspnea on exertion, noting swelling of ankles (mild, non-pitting, intermittent, goes away with elevation) - DASH diet discussed - pt does follow a low sodium diet  Hyperlipidemia: Current Medication Regimen: Atorvastatin 80mg  and Zetia - doing well on this. Added Ozempic at last visit as well.   Last Lipids: - Current Diet: Working on low fat diet. - Denies: Chest pain, shortness of breath, myalgias.  Asthma: - Current Medication Regimen: Albuterol PRN; Using medications With/without: without issues - Symptoms are well controlled  - Nighttime Symptoms: no nighttime asthma symptoms - symptoms include None - Daytime Symptoms: no daytime asthma symptoms - symptoms include None - ER visits since last visit: None - Missed work or school: None  Anemia/CKD III: Has been mild over the last year, labs were stable.  Janice Jennings is seeing [facility] - needs to call for follow up appt - reminded to call them again today. Taking iron supplement.   Obesity: Body mass index is 35.68 kg/m. Diet: Balanced, low  salt.  Diet discussed in detail. Exercise: Janice Jennings walks a few times a week - morning or afternoon Co-Morbid Conditions: diabetes mellitus, dyslipidemias and hypertension; 2 or more of these conditions combined with BMI >30 is considered morbid obesity; is this diagnosis appropriate and/or added to patient's problem list? Yes   Patient Active Problem List   Diagnosis Date Noted  . Insulin long-term use ([place]) 12/08/2018  . Malignant  hypertension 11/15/2018  . Hemoptysis 11/15/2018  . Asthma   . Acute respiratory failure with hypoxia ([place])   . Morbid obesity ([place]) 09/22/2018  . Anemia 06/19/2018  . High cholesterol 06/19/2018  . Diabetic retinopathy of both eyes associated with type 2 diabetes mellitus ([place]) 04/11/2018  . Essential hypertension 03/07/2015  . DM type 2 with diabetic peripheral neuropathy ([place]) 05/26/2014    Past Surgical History:  Procedure Laterality Date  . BREAST REDUCTION SURGERY  2001  . CESAREAN SECTION    . COLONOSCOPY WITH PROPOFOL N/A 08/15/2018   Procedure: COLONOSCOPY WITH PROPOFOL;  Surgeon: Jonathon Bellows, MD;  Location: [facility] ENDOSCOPY;  Service: Gastroenterology;  Laterality: N/A;  . ESOPHAGOGASTRODUODENOSCOPY (EGD) WITH PROPOFOL N/A 08/15/2018   Procedure: ESOPHAGOGASTRODUODENOSCOPY (EGD) WITH PROPOFOL;  Surgeon: Jonathon Bellows, MD;  Location: [facility] ENDOSCOPY;  Service: Gastroenterology;  Laterality: N/A;  . GIVENS CAPSULE STUDY N/A 09/08/2018   Procedure: GIVENS CAPSULE STUDY;  Surgeon: Jonathon Bellows, MD;  Location: [facility] ENDOSCOPY;  Service: Gastroenterology;  Laterality: N/A;  . REDUCTION MAMMAPLASTY Bilateral 2000  . TUBAL LIGATION      Family History  Problem Relation Age of Onset  . Diabetes Father   . Hyperlipidemia Father   . Hypertension Father   . Cancer Maternal Aunt 12       leukemia - died  . Diabetes Maternal Grandmother   . Cancer Maternal Grandmother        breast cancer  . Stroke Maternal Grandmother   . Breast cancer Maternal Grandmother   . Diabetes Daughter        Pre-diabetes  . Diabetes Maternal Grandfather   . Hyperlipidemia Maternal Grandfather   . Hypertension Maternal Grandfather   . Cancer Maternal Aunt 50       breast cancer - died  . Ovarian cancer Neg Hx   . Colon cancer Neg Hx     Social History   Socioeconomic History  . Marital status: Single    Spouse name: Not on file  . Number of children: 2  . Years of education: 38  . Highest  education level: Not on file  Occupational History  . Occupation: Social worker at [employer]: [employer]  . Financial resource strain: Not on file  . Food insecurity    Worry: Not on file    Inability: Not on file  . Transportation needs    Medical: Not on file    Non-medical: Not on file  Tobacco Use  . Smoking status: Never Smoker  . Smokeless tobacco: Never Used  Substance and Sexual Activity  . Alcohol use: Yes    Alcohol/week: 0.0 standard drinks    Comment: occ  . Drug use: No  . Sexual activity: Not on file  Lifestyle  . Physical activity    Days per week: Not on file    Minutes per session: Not on file  . Stress: Not on file  Relationships  . Social Herbalist on phone: Not on file    Gets together: Not  on file    Attends religious service: Not on file    Active member of club or organization: Not on file    Attends meetings of clubs or organizations: Not on file    Relationship status: Not on file  . Intimate partner violence    Fear of current or ex partner: Not on file    Emotionally abused: Not on file    Physically abused: Not on file    Forced sexual activity: Not on file  Other Topics Concern  . Not on file  Social History Narrative   Shantale grew up in [place], [place] and also in [place]. Janice Jennings attending the [employer] and obtained her Bachelors in Psychology. Janice Jennings then attended [employer] and obtained her Masters in Psychology. 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 Janice Jennings is divorced. Janice Jennings lives at home with her two children, Darius age 25 and [place] age 29. They have a dog named Coco who Janice Jennings describes as having separation anxiety. Chandrika enjoys activities with the children. They enjoy outdoor activities, watching movies.       Current Outpatient Medications:  .  ACCU-CHEK SOFTCLIX LANCETS lancets, Check sugars TID DX:E11.42 LON:99, Disp: 100 each, Rfl: 12 .  albuterol  (PROVENTIL HFA;VENTOLIN HFA) 108 (90 Base) MCG/ACT inhaler, Inhale 2 puffs into the lungs every 4 (four) hours as needed for wheezing or shortness of breath., Disp: 1 Inhaler, Rfl: 1 .  amLODipine (NORVASC) 10 MG tablet, TAKE 1 TABLET BY MOUTH ONCE DAILY, Disp: 90 tablet, Rfl: 1 .  aspirin EC 81 MG tablet, Take 81 mg by mouth daily., Disp: , Rfl:  .  atorvastatin (LIPITOR) 80 MG tablet, TAKE 1 TABLET BY MOUTH  DAILY FOR CHOLESTEROL, Disp: 90 tablet, Rfl: 1 .  carvedilol (COREG) 6.25 MG tablet, Take 1 tablet (6.25 mg total) by mouth 2 (two) times daily with a meal. This replaces metoprolol (Lopressor), Disp: 60 tablet, Rfl: 5 .  ezetimibe (ZETIA) 10 MG tablet, Take 1 tablet (10 mg total) by mouth daily., Disp: 90 tablet, Rfl: 3 .  ferrous sulfate 325 (65 FE) MG tablet, Take 325 mg by mouth daily with breakfast., Disp: , Rfl:  .  gabapentin (NEURONTIN) 300 MG capsule, Take 1 tablet in the morning and 1 tablet in the afternoon.  Take 2 tablets before bed., Disp: 360 capsule, Rfl: 1 .  glucose blood (ACCU-CHEK AVIVA PLUS) test strip, Check sugars tid DX:E11.42 LON:99, Disp: 100 each, Rfl: 12 .  hydrALAZINE (APRESOLINE) 100 MG tablet, TAKE 1 TABLET BY MOUTH 3  TIMES DAILY, Disp: 270 tablet, Rfl: 2 .  Insulin Glargine (LANTUS SOLOSTAR) 100 UNIT/ML Solostar Pen, Inject 50 Units into the skin daily., Disp: 5 pen, Rfl: 3 .  Insulin Pen Needle (NOVOFINE) 32G X 6 MM MISC, 1 each by Does not apply route daily., Disp: 100 each, Rfl: 3 .  losartan-hydrochlorothiazide (HYZAAR) 50-12.5 MG tablet, TAKE 1 TABLET BY MOUTH  DAILY, Disp: 90 tablet, Rfl: 3 .  Multiple Vitamin (MULTIVITAMIN) tablet, Take 1 tablet by mouth daily., Disp: , Rfl:  .  metroNIDAZOLE (METROGEL) 0.75 % gel, 2 (two) times daily as needed for rash., Disp: , Rfl:  .  OZEMPIC, 0.25 OR 0.5 MG/DOSE, 2 MG/1.5ML SOPN, INJECT 0.5 MG INTO THE SKIN ONCE A WEEK FOR 28 DAYS. STOP TRADJENTA, Disp: , Rfl:   No Known Allergies  I personally reviewed active  problem list, medication list, allergies, notes from last encounter, lab results with the patient/caregiver today.  ROS  Constitutional: Negative for fever or weight change.  Respiratory: Negative for cough and shortness of breath.   Cardiovascular: Negative for chest pain or palpitations.  Gastrointestinal: Negative for abdominal pain, no bowel changes.  Musculoskeletal: Negative for gait problem or joint swelling.  Skin: Negative for rash.  Neurological: Negative for dizziness or headache.  No other specific complaints in a complete review of systems (except as listed in HPI above).  Objective  Vitals:   08/10/19 0830  BP: 128/82  Pulse: 89  Resp: 16  Temp: 98 F (36.7 C)  TempSrc: Oral  SpO2: 98%  Weight: 201 lb 6.4 oz (91.4 kg)  Height: 5\' 3"  (1.6 m)   Body mass index is 35.68 kg/m.  Physical Exam  Constitutional: Patient appears well-developed and well-nourished. No distress.  HENT: Head: Normocephalic and atraumatic.  Eyes: Conjunctivae and EOM are normal. No scleral icterus.  Neck: Normal range of motion. Neck supple. No JVD present. No thyromegaly present.  Cardiovascular: Normal rate, regular rhythm and normal heart sounds.  No murmur heard. No BLE edema. Pulmonary/Chest: Effort normal and breath sounds normal. No respiratory distress. Musculoskeletal: Normal range of motion, no joint effusions. No gross deformities Neurological: Pt is alert and oriented to person, place, and time. No cranial nerve deficit. Coordination, balance, strength, speech and gait are normal.  Skin: Skin is warm and dry. No rash noted. No erythema.  Psychiatric: Patient has a normal mood and affect. behavior is normal. Judgment and thought content normal.  No results found for this or any previous visit (from the past 72 hour(s)).   PHQ2/9: Depression screen [facility] 2/9 08/10/2019 04/10/2019 12/02/2018 09/22/2018 06/19/2018  Decreased Interest 0 0 0 0 0  Down, Depressed, Hopeless 0 0 0 0 0   PHQ - 2 Score 0 0 0 0 0  Altered sleeping 0 0 0 0 -  Tired, decreased energy 0 0 0 0 -  Change in appetite 0 0 0 0 -  Feeling bad or failure about yourself  0 0 0 0 -  Trouble concentrating 0 0 0 0 -  Moving slowly or fidgety/restless 0 0 0 0 -  Suicidal thoughts 0 0 0 0 -  PHQ-9 Score 0 0 0 0 -  Difficult doing work/chores Not difficult at all Not difficult at all Not difficult at all Not difficult at all -   PHQ-2/9 Result is negative.    Fall Risk: Fall Risk  08/10/2019 04/10/2019 12/02/2018 09/22/2018 06/19/2018  Falls in the past year? 1 0 0 0 No  Number falls in past yr: 1 0 - - -  Injury with Fall? 1 0 - - -  Follow up Falls evaluation completed Falls evaluation completed - - -    Assessment & Plan 1. DM type 2 with diabetic peripheral neuropathy (HCC) - COMPLETE METABOLIC PANEL WITH GFR - Hemoglobin A1c - Urine Microalbumin w/creat. ratio - gabapentin (NEURONTIN) 300 MG capsule; Take 1 tablet in the morning and 1 tablet in the afternoon.  Take 2 tablets before bed.  Dispense: 360 capsule; Refill: 1  2. Moderate nonproliferative diabetic retinopathy of both eyes associated with type 2 diabetes mellitus, macular edema presence unspecified ([place]) - Seeing ophthalmology for close follow up  3. Hot flashes due to menopause - FSH/LH - TSH - venlafaxine XR (EFFEXOR XR) 37.5 MG 24 hr capsule; Take 1 capsule (37.5 mg total) by mouth daily with breakfast.  Dispense: 90 capsule; Refill: 1  4. Arthralgia, unspecified joint - Sedimentation rate -  C-reactive protein  5. Joint stiffness - Sedimentation rate - C-reactive protein  6. Essential hypertension - Stable, doing well on current regimen.  7. Hyperlipidemia due to type 2 diabetes mellitus ([place]) - Lipid panel  8. Mild intermittent asthma without complication - Stable without daily inhaler.   9. Anemia, unspecified type - Last check in July was stable  10. Stage 3b chronic kidney disease - Needs to schedule follow up  with nephrology  11. Morbid obesity (HCC) - TSH - Discussed importance of 150 minutes of physical activity weekly, eat two servings of fish weekly, eat one serving of tree nuts ( cashews, pistachios, pecans, almonds.Marland Kitchen) every other day, eat 6 servings of fruit/vegetables daily and drink plenty of water and avoid sweet beverages.   12. Need for influenza vaccination - Flu Vaccine MDCK QUAD PF  13. Encounter for screening mammogram for malignant neoplasm of breast - MM 3D SCREEN BREAST BILATERAL; Future

## 2019-08-11 ENCOUNTER — Other Ambulatory Visit: Payer: Self-pay | Admitting: Family Medicine

## 2019-08-11 DIAGNOSIS — E1142 Type 2 diabetes mellitus with diabetic polyneuropathy: Secondary | ICD-10-CM

## 2019-08-11 LAB — LIPID PANEL
Cholesterol: 126 mg/dL (ref <200)
HDL: 40 mg/dL — ABNORMAL LOW (ref >=50)
LDL Cholesterol (Calc): 66 mg/dL (calc)
Non-HDL Cholesterol (Calc): 86 mg/dL (calc) (ref <130)
Total CHOL/HDL Ratio: 3.2 (calc) (ref <5.0)
Triglycerides: 123 mg/dL (ref <150)

## 2019-08-11 LAB — HEMOGLOBIN A1C: Hgb A1c MFr Bld: >14 % of total Hgb — ABNORMAL HIGH (ref <5.7)

## 2019-08-11 LAB — COMPLETE METABOLIC PANEL WITH GFR
AG Ratio: 1.2 (calc) (ref 1.0–2.5)
ALT: 18 U/L (ref 6–29)
AST: 16 U/L (ref 10–35)
Albumin: 3.9 g/dL (ref 3.6–5.1)
Alkaline phosphatase (APISO): 85 U/L (ref 37–153)
BUN/Creatinine Ratio: 19 (calc) (ref 6–22)
BUN: 39 mg/dL — ABNORMAL HIGH (ref 7–25)
CO2: 27 mmol/L (ref 20–32)
Calcium: 9.6 mg/dL (ref 8.6–10.4)
Chloride: 101 mmol/L (ref 98–110)
Creat: 2.01 mg/dL — ABNORMAL HIGH (ref 0.50–1.05)
GFR, Est African American: 33 mL/min/{1.73_m2} — ABNORMAL LOW (ref >=60)
GFR, Est Non African American: 28 mL/min/{1.73_m2} — ABNORMAL LOW (ref >=60)
Globulin: 3.2 g/dL (calc) (ref 1.9–3.7)
Glucose, Bld: 339 mg/dL — ABNORMAL HIGH (ref 65–99)
Potassium: 4 mmol/L (ref 3.5–5.3)
Sodium: 137 mmol/L (ref 135–146)
Total Bilirubin: 1 mg/dL (ref 0.2–1.2)
Total Protein: 7.1 g/dL (ref 6.1–8.1)

## 2019-08-11 LAB — MICROALBUMIN / CREATININE URINE RATIO
Creatinine, Urine: 89 mg/dL (ref 20–275)
Microalb Creat Ratio: 435 mcg/mg creat — ABNORMAL HIGH (ref <30)
Microalb, Ur: 38.7 mg/dL

## 2019-08-11 LAB — FSH/LH
FSH: 49.2 m[IU]/mL
LH: 39.2 m[IU]/mL

## 2019-08-11 LAB — C-REACTIVE PROTEIN: CRP: 1.5 mg/L (ref <8.0)

## 2019-08-11 LAB — TSH: TSH: 2.42 mIU/L

## 2019-08-11 LAB — SEDIMENTATION RATE: Sed Rate: 46 mm/h — ABNORMAL HIGH (ref 0–20)

## 2019-08-11 MED ORDER — LANTUS SOLOSTAR 100 UNIT/ML ~~LOC~~ SOPN: 50.0000 [IU] | PEN_INJECTOR | Freq: Every day | SUBCUTANEOUS | 3 refills | Status: DC

## 2019-08-11 MED ORDER — OZEMPIC (1 MG/DOSE) 2 MG/1.5ML ~~LOC~~ SOPN: 1.0000 mg | PEN_INJECTOR | SUBCUTANEOUS | 1 refills | Status: DC

## 2019-08-25 ENCOUNTER — Other Ambulatory Visit: Payer: Self-pay

## 2019-08-25 ENCOUNTER — Encounter: Payer: Self-pay | Admitting: Family Medicine

## 2019-08-25 ENCOUNTER — Ambulatory Visit (INDEPENDENT_AMBULATORY_CARE_PROVIDER_SITE_OTHER): Payer: 59 | Admitting: Family Medicine

## 2019-08-25 ENCOUNTER — Ambulatory Visit
Admission: RE | Admit: 2019-08-25 | Discharge: 2019-08-25 | Disposition: A | Discharge status: Home / Self Care | Payer: 59 | Source: Ambulatory Visit | Attending: Family Medicine | Admitting: Family Medicine

## 2019-08-25 DIAGNOSIS — E1165 Type 2 diabetes mellitus with hyperglycemia: Secondary | ICD-10-CM | POA: Diagnosis not present

## 2019-08-25 DIAGNOSIS — IMO0002 Reserved for concepts with insufficient information to code with codable children: Secondary | ICD-10-CM

## 2019-08-25 DIAGNOSIS — E1143 Type 2 diabetes mellitus with diabetic autonomic (poly)neuropathy: Secondary | ICD-10-CM

## 2019-08-25 DIAGNOSIS — Z1231 Encounter for screening mammogram for malignant neoplasm of breast: Secondary | ICD-10-CM | POA: Diagnosis not present

## 2019-08-25 NOTE — Progress Notes (Signed)
Name: Janice Jennings   MRN: [id number]    DOB: 1971/05/01   Date:08/25/2019       Progress Note  Subjective  Chief Complaint  Chief Complaint  Patient presents with  . Follow-up    2 week recheck    I connected with  Janice Jennings on 08/25/19 at 10:40 AM EDT by telephone and verified that I am speaking with the correct person using two identifiers.   I discussed the limitations, risks, security and privacy concerns of performing an evaluation and management service by telephone and the availability of in person appointments. Staff also discussed with the patient that there may be a patient responsible charge related to this service. Patient Location: Home Provider Location: Office Additional Individuals present: none  HPI  Pt presents to follow up on DM - she was found to have A1C >14 at last visit 2 weeks ago.  Since our last visit, she increased her Lantus to 60 units and increased Ozempic to 1mg  weekly.  She does endorse some blurred vision since her sugars have been elevated. She denies polyuria, polyphagia, or polydipsia.  The last few days her BG's have been in the 130's and she is not feeling shaky or sweaty; she states if she ever dips below 130, she feels this way.  FBS over the last 2 weeks - started around 300's, came down into the 200's, now in the last 3 days has been hovering around the 130-200 range.  She has been referred to endocrinology due to the ongoing poorly controlled DM.  She is awaiting an appointment at this time - I have reached back out to our referrals coordinator to ensure this appt is made.  Patient Active Problem List   Diagnosis Date Noted  . Hyperlipidemia due to type 2 diabetes mellitus ([place]) 08/10/2019  . Stage 3b chronic kidney disease 08/10/2019  . Insulin long-term use ([place]) 12/08/2018  . 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 Hemoptysis 11/15/2018  . Asthma   . Acute respiratory failure with hypoxia ([place])   . Morbid obesity ([place]) 09/22/2018   . Anemia 06/19/2018  . High cholesterol 06/19/2018  . Diabetic retinopathy of both eyes associated with type 2 diabetes mellitus ([place]) 04/11/2018  . Essential hypertension 03/07/2015  . DM type 2 with diabetic peripheral neuropathy ([place]) 05/26/2014    Social History   Tobacco Use  . Smoking status: Never Smoker  . Smokeless tobacco: Never Used  Substance Use Topics  . Alcohol use: Yes    Alcohol/week: 0.0 standard drinks    Comment: occ     Current Outpatient Medications:  .  ACCU-CHEK SOFTCLIX LANCETS lancets, Check sugars TID DX:E11.42 LON:99, Disp: 100 each, Rfl: 12 .  albuterol (PROVENTIL HFA;VENTOLIN HFA) 108 (90 Base) MCG/ACT inhaler, Inhale 2 puffs into the lungs every 4 (four) hours as needed for wheezing or shortness of breath., Disp: 1 Inhaler, Rfl: 1 .  amLODipine (NORVASC) 10 MG tablet, TAKE 1 TABLET BY MOUTH ONCE DAILY, Disp: 90 tablet, Rfl: 1 .  atorvastatin (LIPITOR) 80 MG tablet, TAKE 1 TABLET BY MOUTH  DAILY FOR CHOLESTEROL, Disp: 90 tablet, Rfl: 1 .  carvedilol (COREG) 6.25 MG tablet, Take 1 tablet (6.25 mg total) by mouth 2 (two) times daily with a meal. This replaces metoprolol (Lopressor), Disp: 60 tablet, Rfl: 5 .  ezetimibe (ZETIA) 10 MG tablet, Take 1 tablet (10 mg total) by mouth daily., Disp: 90 tablet, Rfl: 3 .  ferrous sulfate 325 (65 FE) MG tablet, Take 325 mg  by mouth daily with breakfast., Disp: , Rfl:  .  gabapentin (NEURONTIN) 300 MG capsule, Take 1 tablet in the morning and 1 tablet in the afternoon.  Take 2 tablets before bed., Disp: 360 capsule, Rfl: 1 .  glucose blood (ACCU-CHEK AVIVA PLUS) test strip, Check sugars tid DX:E11.42 LON:99, Disp: 100 each, Rfl: 12 .  hydrALAZINE (APRESOLINE) 100 MG tablet, TAKE 1 TABLET BY MOUTH 3  TIMES DAILY, Disp: 270 tablet, Rfl: 2 .  Insulin Glargine (LANTUS SOLOSTAR) 100 UNIT/ML Solostar Pen, Inject 50 Units into the skin daily., Disp: 5 pen, Rfl: 3 .  Insulin Pen Needle (NOVOFINE) 32G X 6 MM MISC, 1 each by Does  not apply route daily., Disp: 100 each, Rfl: 3 .  losartan-hydrochlorothiazide (HYZAAR) 50-12.5 MG tablet, TAKE 1 TABLET BY MOUTH  DAILY, Disp: 90 tablet, Rfl: 3 .  metroNIDAZOLE (METROGEL) 0.75 % gel, 2 (two) times daily as needed for rash., Disp: , Rfl:  .  Multiple Vitamin (MULTIVITAMIN) tablet, Take 1 tablet by mouth daily., Disp: , Rfl:  .  Semaglutide, 1 MG/DOSE, (OZEMPIC, 1 MG/DOSE,) 2 MG/1.5ML SOPN, Inject 1 mg into the skin once a week., Disp: 12 pen, Rfl: 1 .  venlafaxine XR (EFFEXOR XR) 37.5 MG 24 hr capsule, Take 1 capsule (37.5 mg total) by mouth daily with breakfast., Disp: 90 capsule, Rfl: 1 .  aspirin EC 81 MG tablet, Take 81 mg by mouth daily., Disp: , Rfl:   No Known Allergies  I personally reviewed active problem list, medication list, allergies, notes from last encounter, lab results with the patient/caregiver today.  ROS  Ten systems reviewed and is negative except as mentioned in HPI  Objective  Virtual encounter, vitals not obtained.  There is no height or weight on file to calculate BMI.  Nursing Note and Vital Signs reviewed.  Physical Exam  Pulmonary/Chest: Effort normal. No respiratory distress. Speaking in complete sentences Neurological: Pt is alert and oriented to person, place, and time. Coordination, speech and gait are normal.  Psychiatric: Patient has a normal mood and affect. behavior is normal. Judgment and thought content normal.  No results found for this or any previous visit (from the past 72 hour(s)).  Assessment & Plan  1. 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  Disussed hypoglycemia risks, signs, and symptoms and what to do should these occur.  She verbalizes understanding.  Awaiting endocrinology referral.  Will hold off on titrating insulin further as her BG's have come down to the 130's on 60 units.   -Red flags and when to present for emergency care or RTC including fever >101.82F, chest pain, shortness of breath, new/worsening/un-resolving symptoms, reviewed with patient at time of visit. Follow up and care instructions discussed and provided in AVS. - I discussed the assessment and treatment plan with the patient. The patient was provided an opportunity to ask questions and all were answered. The patient agreed with the plan and demonstrated an understanding of the instructions.  - The patient was advised to call back or seek an in-person evaluation if the symptoms worsen or if the condition fails to improve as anticipated.  I provided 15 minutes of non-face-to-face time during this encounter.  Hubbard Hartshorn, FNP

## 2019-08-31 DIAGNOSIS — E1122 Type 2 diabetes mellitus with diabetic chronic kidney disease: Secondary | ICD-10-CM | POA: Insufficient documentation

## 2019-08-31 DIAGNOSIS — I129 Hypertensive chronic kidney disease with stage 1 through stage 4 chronic kidney disease, or unspecified chronic kidney disease: Secondary | ICD-10-CM | POA: Insufficient documentation

## 2019-08-31 DIAGNOSIS — N189 Chronic kidney disease, unspecified: Secondary | ICD-10-CM | POA: Insufficient documentation

## 2019-08-31 DIAGNOSIS — D631 Anemia in chronic kidney disease: Secondary | ICD-10-CM | POA: Insufficient documentation

## 2019-08-31 DIAGNOSIS — R809 Proteinuria, unspecified: Secondary | ICD-10-CM | POA: Insufficient documentation

## 2019-09-18 ENCOUNTER — Other Ambulatory Visit: Payer: Self-pay

## 2019-09-22 ENCOUNTER — Encounter: Payer: Self-pay | Admitting: Internal Medicine

## 2019-09-22 ENCOUNTER — Ambulatory Visit: Payer: 59 | Admitting: Internal Medicine

## 2019-09-22 VITALS — BP 128/88 | HR 87 | Temp 98.3°F | Ht 63.0 in | Wt 198.2 lb

## 2019-09-22 DIAGNOSIS — E1142 Type 2 diabetes mellitus with diabetic polyneuropathy: Secondary | ICD-10-CM

## 2019-09-22 DIAGNOSIS — E1121 Type 2 diabetes mellitus with diabetic nephropathy: Secondary | ICD-10-CM | POA: Diagnosis not present

## 2019-09-22 DIAGNOSIS — N1832 Chronic kidney disease, stage 3b: Secondary | ICD-10-CM

## 2019-09-22 DIAGNOSIS — E1122 Type 2 diabetes mellitus with diabetic chronic kidney disease: Secondary | ICD-10-CM

## 2019-09-22 DIAGNOSIS — E1165 Type 2 diabetes mellitus with hyperglycemia: Secondary | ICD-10-CM | POA: Diagnosis not present

## 2019-09-22 DIAGNOSIS — Z794 Long term (current) use of insulin: Secondary | ICD-10-CM

## 2019-09-22 DIAGNOSIS — IMO0002 Reserved for concepts with insufficient information to code with codable children: Secondary | ICD-10-CM | POA: Insufficient documentation

## 2019-09-22 NOTE — Patient Instructions (Signed)
-   Continue Lantus 60 units daily  - Continue Ozempic 1 mg weekly     - Check sugar fasting and bedtime     - Choose healthy, lower carb lower calorie snacks: toss salad, cooked vegetables, cottage cheese, peanut butter, low fat cheese / string cheese, lower sodium deli meat, tuna salad or chicken salad     HOW TO TREAT LOW BLOOD SUGARS (Blood sugar LESS THAN 70 MG/DL)  Please follow the RULE OF 15 for the treatment of hypoglycemia treatment (when your (blood sugars are less than 70 mg/dL)    STEP 1: Take 15 grams of carbohydrates when your blood sugar is low, which includes:   3-4 GLUCOSE TABS  OR  3-4 OZ OF JUICE OR REGULAR SODA OR  ONE TUBE OF GLUCOSE GEL     STEP 2: RECHECK blood sugar in 15 MINUTES STEP 3: If your blood sugar is still low at the 15 minute recheck --> then, go back to STEP 1 and treat AGAIN with another 15 grams of carbohydrates.

## 2019-09-22 NOTE — Progress Notes (Signed)
Name: Janice Jennings  MRN/ DOB: [id number], April 04, 1971   Age/ Sex: 48 y.o., female    PCP: Hubbard Hartshorn, FNP   Reason for Endocrinology Evaluation: Type 2 Diabetes Mellitus     Date of Initial Endocrinology Visit: 09/22/2019     PATIENT IDENTIFIER: Janice Jennings is a 48 y.o. female with a past medical history of T2DM, HTN and Dyslipidemia. The patient presented for initial endocrinology clinic visit on 09/22/2019 for consultative assistance with her diabetes management.    HPI: Janice Jennings was    Diagnosed with T2DM 2001 Prior Medications tried/Intolerance: Metformin, Glipizide, Januvia.  Has been on insulin 2018 Currently checking blood sugars 3 x / month  Hypoglycemia episodes : no               Hemoglobin A1c has ranged from 6.6% in 2019, peaking at > 14.0% in 2020. Patient required assistance for hypoglycemia: no Patient has required hospitalization within the last 1 year from hyper or hypoglycemia: no  In terms of diet, the patient eats 2 meals a day, snacks through the day on fruits    HOME DIABETES REGIMEN: Lantus 60 units daily  Ozempic 1 mg weekly    Statin: yes ACE-I/ARB: yes Prior Diabetic Education: yes    METER DOWNLOAD SUMMARY: Date range evaluated: 10/26-11/24/20 Fingerstick Blood Glucose Tests = 6 Average Number Tests/Day = 0.2 Overall Mean FS Glucose = 165 Standard Deviation = 39.7  BG Ranges: Low = 131 High = 236   Hypoglycemic Events/30 Days: BG < 50 = 0 Episodes of symptomatic severe hypoglycemia = 0   DIABETIC COMPLICATIONS: Microvascular complications:   CKD III , neuropathy   Denies: retinopathy   Last eye exam: Completed 2020  Macrovascular complications:    Denies: CAD, PVD, CVA   PAST HISTORY: Past Medical History:  Past Medical History:  Diagnosis Date  . Asthma   . Diabetes mellitus without complication ([place])   . Diabetic retinopathy of both eyes associated with type 2 diabetes mellitus ([place]) 04/11/2018   Moderate to severe, non-proliferative DR; referred to retinal specialist; April 10, 2018  . Hyperlipidemia   . Hypertension   . Migraine    Excedrin Tension HA OTC   Past Surgical History:  Past Surgical History:  Procedure Laterality Date  . BREAST REDUCTION SURGERY  2001  . CESAREAN SECTION    . COLONOSCOPY WITH PROPOFOL N/A 08/15/2018   Procedure: COLONOSCOPY WITH PROPOFOL;  Surgeon: Jonathon Bellows, MD;  Location: [facility] ENDOSCOPY;  Service: Gastroenterology;  Laterality: N/A;  . ESOPHAGOGASTRODUODENOSCOPY (EGD) WITH PROPOFOL N/A 08/15/2018   Procedure: ESOPHAGOGASTRODUODENOSCOPY (EGD) WITH PROPOFOL;  Surgeon: Jonathon Bellows, MD;  Location: [facility] ENDOSCOPY;  Service: Gastroenterology;  Laterality: N/A;  . GIVENS CAPSULE STUDY N/A 09/08/2018   Procedure: GIVENS CAPSULE STUDY;  Surgeon: Jonathon Bellows, MD;  Location: [facility] ENDOSCOPY;  Service: Gastroenterology;  Laterality: N/A;  . REDUCTION MAMMAPLASTY Bilateral 2000  . TUBAL LIGATION        Social History:  reports that she has never smoked. She has never used smokeless tobacco. She reports current alcohol use. She reports that she does not use drugs. Family History:  Family History  Problem Relation Age of Onset  . Diabetes Father   . Hyperlipidemia Father   . Hypertension Father   . Cancer Maternal Aunt 71       leukemia - died  . Diabetes Maternal Grandmother   . Cancer Maternal Grandmother        breast cancer  . Stroke  Maternal Grandmother   . Breast cancer Maternal Grandmother   . Diabetes Daughter        Pre-diabetes  . Diabetes Maternal Grandfather   . Hyperlipidemia Maternal Grandfather   . Hypertension Maternal Grandfather   . Cancer Maternal Aunt 50       breast cancer - died  . Ovarian cancer Neg Hx   . Colon cancer Neg Hx      HOME MEDICATIONS: Allergies as of 09/22/2019   No Known Allergies     Medication List       Accurate as of September 22, 2019 10:51 AM. If you have any questions, ask your nurse or  doctor.        Accu-Chek Softclix Lancets lancets Check sugars TID DX:E11.42 LON:99   albuterol 108 (90 Base) MCG/ACT inhaler Commonly known as: VENTOLIN HFA Inhale 2 puffs into the lungs every 4 (four) hours as needed for wheezing or shortness of breath.   amLODipine 10 MG tablet Commonly known as: NORVASC TAKE 1 TABLET BY MOUTH ONCE DAILY   atorvastatin 80 MG tablet Commonly known as: LIPITOR TAKE 1 TABLET BY MOUTH  DAILY FOR CHOLESTEROL   carvedilol 6.25 MG tablet Commonly known as: COREG Take 1 tablet (6.25 mg total) by mouth 2 (two) times daily with a meal. This replaces metoprolol (Lopressor)   ezetimibe 10 MG tablet Commonly known as: Zetia Take 1 tablet (10 mg total) by mouth daily.   ferrous sulfate 325 (65 FE) MG tablet Take 325 mg by mouth daily with breakfast.   gabapentin 300 MG capsule Commonly known as: NEURONTIN Take 1 tablet in the morning and 1 tablet in the afternoon.  Take 2 tablets before bed.   glucose blood test strip Commonly known as: Accu-Chek Aviva Plus Check sugars tid DX:E11.42 LON:99   hydrALAZINE 100 MG tablet Commonly known as: APRESOLINE TAKE 1 TABLET BY MOUTH 3  TIMES DAILY   Lantus SoloStar 100 UNIT/ML Solostar Pen Generic drug: Insulin Glargine Inject 50 Units into the skin daily.   losartan-hydrochlorothiazide 50-12.5 MG tablet Commonly known as: HYZAAR TAKE 1 TABLET BY MOUTH  DAILY   metroNIDAZOLE 0.75 % gel Commonly known as: METROGEL 2 (two) times daily as needed for rash.   multivitamin tablet Take 1 tablet by mouth daily.   NovoFine 32G X 6 MM Misc Generic drug: Insulin Pen Needle 1 each by Does not apply route daily.   Ozempic (1 MG/DOSE) 2 MG/1.5ML Sopn Generic drug: Semaglutide (1 MG/DOSE) Inject 1 mg into the skin once a week.   venlafaxine XR 37.5 MG 24 hr capsule Commonly known as: Effexor XR Take 1 capsule (37.5 mg total) by mouth daily with breakfast.        ALLERGIES: No Known Allergies    REVIEW OF SYSTEMS: A comprehensive ROS was conducted with the patient and is negative except as per HPI and below:  Review of Systems  Constitutional: Negative for chills and fever.  HENT: Negative for congestion and sore throat.   Respiratory: Negative for cough and shortness of breath.   Cardiovascular: Negative for chest pain and palpitations.  Gastrointestinal: Negative for diarrhea and nausea.  Neurological: Positive for tingling. Negative for tremors.  Psychiatric/Behavioral: Negative for depression. The patient is not nervous/anxious.       OBJECTIVE:   VITAL SIGNS: BP 128/88 (BP Location: Left Arm, Patient Position: Sitting, Cuff Size: Large)   Pulse 87   Temp 98.3 F (36.8 C)   Ht 5\' 3"  (1.6 m)  Wt 198 lb 3.2 oz (89.9 kg)   SpO2 99%   BMI 35.11 kg/m    PHYSICAL EXAM:  General: Pt appears well and is in NAD  Hydration: Well-hydrated with moist mucous membranes and good skin turgor  HEENT: Head: Unremarkable with good dentition. Oropharynx clear without exudate.  Eyes: External eye exam normal without stare, lid lag or exophthalmos.  EOM intact.  PERRL.  Neck: General: Supple without adenopathy or carotid bruits. Thyroid: Thyroid size normal.  No goiter or nodules appreciated. No thyroid bruit.  Lungs: Clear with good BS bilat with no rales, rhonchi, or wheezes  Heart: RRR with normal S1 and S2 and no gallops; no murmurs; no rub  Abdomen: Normoactive bowel sounds, soft, nontender, without masses or organomegaly palpable  Extremities:  Lower extremities - No pretibial edema. No lesions.  Skin: Normal texture and temperature to palpation. No rash noted. No Acanthosis nigricans/skin tags. No lipohypertrophy.  Neuro: MS is good with appropriate affect, pt is alert and Ox3    DM foot exam:    DATA REVIEWED:  Lab Results  Component Value Date   HGBA1C >14.0 (H) 08/10/2019   HGBA1C 9.5 (H) 04/10/2019   HGBA1C 8.1 (H) 11/16/2018   Lab Results  Component Value  Date   MICROALBUR 38.7 08/10/2019   LDLCALC 66 08/10/2019   CREATININE 2.01 (H) 08/10/2019   Lab Results  Component Value Date   MICRALBCREAT 435 (H) 08/10/2019    Lab Results  Component Value Date   CHOL 126 08/10/2019   HDL 40 (L) 08/10/2019   LDLCALC 66 08/10/2019   TRIG 123 08/10/2019   CHOLHDL 3.2 08/10/2019        ASSESSMENT / PLAN / RECOMMENDATIONS:   1) Type 2 Diabetes Mellitus, Poorly controlled, With CKD III, and neuropathic complications - Most recent A1c of 14.0 %. Goal A1c < 7.0 %.    Plan: GENERAL: I have discussed with the patient the pathophysiology of diabetes. We went over the natural progression of the disease. We talked about both insulin resistance and insulin deficiency. We stressed the importance of lifestyle changes including diet and exercise. I explained the complications associated with diabetes including retinopathy, nephropathy, neuropathy as well as increased risk of cardiovascular disease. We went over the benefit seen with glycemic control.    I explained to the patient that diabetic patients are at higher than normal risk for amputations.   Pt admits to dietary indiscretions during the quarantine with her 41 yr old daughter but has made lifestyle changes in the past month.   We did also discuss that with her poor renal function, we are limited on add-on therapy. If hyperglycemia continues will have to add prandial insulin.  At this time, we will not make any changes , she was encouraged to increase glucose checks.   We also discussed avoiding frequent snacking , as it will be impossible to reach near optimal glucose control if she continues to snack through the day   MEDICATIONS:  - Continue Lantus 60 units daily  - Continue Ozempic 1 mg weekly    EDUCATION / INSTRUCTIONS:  BG monitoring instructions: Patient is instructed to check her blood sugars 2 times a day, fasting and bedtime .  Call [facility] if: BG  persistently < 70 or > 300. . I reviewed the Rule of 15 for the treatment of hypoglycemia in detail with the patient. Literature supplied.   2) Diabetic complications:   Eye: Does not have known diabetic retinopathy.  Neuro/ Feet: Does  have known diabetic peripheral neuropathy.  Renal: Patient does  have known baseline CKD. She is  on an ACEI/ARB at present.Marland Kitchen   3) Lipids: Patient is on lipitor 80 mg daily . LDL is at goal of 66 mg/dL    4) Hypertension: She is  at goal of < 140/90 mmHg.    F/U in 2 months    Signed electronically by: Mack Guise, MD  [facility] Endocrinology  [facility] [address] [place], [place] [address] Phone: [phone] FAX: [phone]   CC: Hubbard Hartshorn, [address] [address] Phone: [phone]  Fax: [phone]    Return to Endocrinology clinic as below: Future Appointments  Date Time Provider [place]  11/10/2019  8:20 AM Hubbard Hartshorn, FNP [facility]

## 2019-10-07 ENCOUNTER — Encounter: Payer: Self-pay | Admitting: Family Medicine

## 2019-10-09 ENCOUNTER — Encounter: Payer: Self-pay | Admitting: Family Medicine

## 2019-10-10 ENCOUNTER — Other Ambulatory Visit: Payer: Self-pay | Admitting: Family Medicine

## 2019-10-14 ENCOUNTER — Encounter: Payer: Self-pay | Admitting: Family Medicine

## 2019-10-16 ENCOUNTER — Other Ambulatory Visit: Payer: Self-pay | Admitting: Family Medicine

## 2019-10-22 ENCOUNTER — Encounter: Payer: Self-pay | Admitting: Family Medicine

## 2019-11-02 LAB — HM DIABETES EYE EXAM

## 2019-11-10 ENCOUNTER — Encounter: Payer: Self-pay | Admitting: Family Medicine

## 2019-11-10 ENCOUNTER — Other Ambulatory Visit (HOSPITAL_COMMUNITY)
Admission: RE | Admit: 2019-11-10 | Discharge: 2019-11-10 | Disposition: A | Discharge status: Home / Self Care | Payer: 59 | Source: Ambulatory Visit | Attending: Family Medicine | Admitting: Family Medicine

## 2019-11-10 ENCOUNTER — Ambulatory Visit: Payer: 59 | Admitting: Family Medicine

## 2019-11-10 ENCOUNTER — Other Ambulatory Visit: Payer: Self-pay

## 2019-11-10 VITALS — BP 132/72 | HR 97 | Temp 97.6°F | Resp 14 | Ht 63.0 in | Wt 199.2 lb

## 2019-11-10 DIAGNOSIS — Z113 Encounter for screening for infections with a predominantly sexual mode of transmission: Secondary | ICD-10-CM | POA: Insufficient documentation

## 2019-11-10 DIAGNOSIS — I10 Essential (primary) hypertension: Secondary | ICD-10-CM

## 2019-11-10 DIAGNOSIS — E1169 Type 2 diabetes mellitus with other specified complication: Secondary | ICD-10-CM

## 2019-11-10 DIAGNOSIS — J4520 Mild intermittent asthma, uncomplicated: Secondary | ICD-10-CM

## 2019-11-10 DIAGNOSIS — E785 Hyperlipidemia, unspecified: Secondary | ICD-10-CM

## 2019-11-10 DIAGNOSIS — E1165 Type 2 diabetes mellitus with hyperglycemia: Secondary | ICD-10-CM | POA: Diagnosis not present

## 2019-11-10 DIAGNOSIS — M5416 Radiculopathy, lumbar region: Secondary | ICD-10-CM | POA: Insufficient documentation

## 2019-11-10 DIAGNOSIS — Z794 Long term (current) use of insulin: Secondary | ICD-10-CM

## 2019-11-10 DIAGNOSIS — A6004 Herpesviral vulvovaginitis: Secondary | ICD-10-CM | POA: Insufficient documentation

## 2019-11-10 DIAGNOSIS — N1832 Chronic kidney disease, stage 3b: Secondary | ICD-10-CM | POA: Diagnosis not present

## 2019-11-10 MED ORDER — CYCLOBENZAPRINE HCL 5 MG PO TABS: 5.0000 mg | ORAL_TABLET | Freq: Three times a day (TID) | ORAL | 1 refills | Status: DC | PRN

## 2019-11-10 MED ORDER — VALACYCLOVIR HCL 500 MG PO TABS: ORAL_TABLET | ORAL | 1 refills | Status: DC

## 2019-11-10 NOTE — Progress Notes (Addendum)
Name: Janice Jennings   MRN: [id number]    DOB: 07/21/1971   Date:11/10/2019       Progress Note  Subjective  Chief Complaint  Chief Complaint  Patient presents with  . Diabetes    3 month follow up, BS running 94-110  . Hyperlipidemia  . Hypertension    HPI  Pt presents for follow up:  DM: She was referred to Endocrinology for T2DM and HLD - she saw Dr. Kelton Pillar 09/22/2019.  No changes were made to medications - Lantus 60 units nightly, 1mg  Ozempic weekly.  She was encouraged to decrease snacking and improve diabetic diet.  Was also encouraged to increase BG checks.  She notes since changing diet, energy has been better, no longer having blurred vision.  FBG's  Have been 90-110 for about a month now.   CKD: Seeing Dr. Juleen China. Taking Losartan-HCTZ.  Avoiding NSAIDs, trying to stay well hydrated.  Last visit was November 2020.  HTN: She is checking BP at home, taking losartan-HCTZ, amlodipine and carvedilol; taking hydralazine TID as well.  Managed by Dr. Abigail Butts.  BP is at goal today.  Denies chest pain, shortness of breath, BLE edema, or headaches.   HLD: She has been taking 80mg  atorvastatin and zetia; last lipids were at goal. Denies myalgias or chest pain.  Vaginal itching:  She is having some vaginal itching on and off for several weeks now.  She denies dryness.  She does also note history of genital herpes.    Hot Flashes: She has been taking effexor for Hot flashes and this is very effective.  It has been very effective for her so we will maintain.  She notes low libido - last period was May 2020.   Lumbar Radiculopathy: She is seeing Spine with [facility] - she had some imaging showing a pinched nerve.  She took steroid to reduce inflammation and muscle relaxer and this helped initially.  She is still having bilateral low back pain, though no radiation into the legs this time.  - Reading from [facility] PA-C of Lumbar Xray on 10/08/2019: Less than grade 1  spondylolisthesis at L4-L5. Posterior disc space narrowing at L5-S1. Facet arthropathy  Patient Active Problem List   Diagnosis Date Noted  . Type 2 diabetes mellitus with hyperglycemia, with long-term current use of insulin ([place]) 09/22/2019  . Type 2 diabetes mellitus with stage 3b chronic kidney disease, with long-term current use of insulin ([place]) 09/22/2019  . Type 2 diabetes mellitus with diabetic polyneuropathy, with long-term current use of insulin ([place]) 09/22/2019  . Hyperlipidemia due to type 2 diabetes mellitus ([place]) 08/10/2019  . Stage 3b chronic kidney disease 08/10/2019  . Insulin long-term use ([place]) 12/08/2018  . 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 Hemoptysis 11/15/2018  . Asthma   . Acute respiratory failure with hypoxia ([place])   . Morbid obesity ([place]) 09/22/2018  . Anemia 06/19/2018  . High cholesterol 06/19/2018  . Diabetic retinopathy of both eyes associated with type 2 diabetes mellitus ([place]) 04/11/2018  . Essential hypertension 03/07/2015  . DM type 2 with diabetic peripheral neuropathy ([place]) 05/26/2014    Past Surgical History:  Procedure Laterality Date  . BREAST REDUCTION SURGERY  2001  . CESAREAN SECTION    . COLONOSCOPY WITH PROPOFOL N/A 08/15/2018   Procedure: COLONOSCOPY WITH PROPOFOL;  Surgeon: Jonathon Bellows, MD;  Location: [facility] ENDOSCOPY;  Service: Gastroenterology;  Laterality: N/A;  . ESOPHAGOGASTRODUODENOSCOPY (EGD) WITH PROPOFOL N/A 08/15/2018   Procedure: ESOPHAGOGASTRODUODENOSCOPY (EGD) WITH PROPOFOL;  Surgeon:  Jonathon Bellows, MD;  Location: [facility] ENDOSCOPY;  Service: Gastroenterology;  Laterality: N/A;  . GIVENS CAPSULE STUDY N/A 09/08/2018   Procedure: GIVENS CAPSULE STUDY;  Surgeon: Jonathon Bellows, MD;  Location[facility] ENDOSCOPY;  Service: Gastroenterology;  Laterality: N/A;  . REDUCTION MAMMAPLASTY Bilateral 2000  . TUBAL LIGATION      Family History  Problem Relation Age of Onset  . Diabetes Father   . Hyperlipidemia Father   . Hypertension Father    . Cancer Maternal Aunt 70       leukemia - died  . Diabetes Maternal Grandmother   . Cancer Maternal Grandmother        breast cancer  . Stroke Maternal Grandmother   . Breast cancer Maternal Grandmother   . Diabetes Daughter        Pre-diabetes  . Diabetes Maternal Grandfather   . Hyperlipidemia Maternal Grandfather   . Hypertension Maternal Grandfather   . Cancer Maternal Aunt 50       breast cancer - died  . Ovarian cancer Neg Hx   . Colon cancer Neg Hx     Social History   Socioeconomic History  . Marital status: Single    Spouse name: Not on file  . Number of children: 2  . Years of education: 2  . Highest education level: Not on file  Occupational History  . Occupation: Social worker at [employer]: [employer]  Tobacco Use  . Smoking status: Never Smoker  . Smokeless tobacco: Never Used  Substance and Sexual Activity  . Alcohol use: Yes    Alcohol/week: 0.0 standard drinks    Comment: occ  . Drug use: No  . Sexual activity: Not on file  Other Topics Concern  . Not on file  Social History Narrative   Aleida grew up in [place], [place] and also in [place]. She attending the [employer] and obtained her Bachelors in Psychology. She then attended [employer] and obtained her Masters in Psychology. 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 She lives at home with her two children, Darius age 63 and Fairview age 62. They have a dog named Coco who she describes as having separation anxiety. [place] enjoys activities with the children. They enjoy outdoor activities, watching movies.     Social Determinants of Health   Financial Resource Strain:   . Difficulty of Paying Living Expenses: Not on file  Food Insecurity:   . Worried About Charity fundraiser in the Last Year: Not on file  . Ran Out of Food in the Last Year: Not on file  Transportation Needs:   . Lack of Transportation (Medical): Not on  file  . Lack of Transportation (Non-Medical): Not on file  Physical Activity:   . Days of Exercise per Week: Not on file  . Minutes of Exercise per Session: Not on file  Stress:   . Feeling of Stress : Not on file  Social Connections:   . Frequency of Communication with Friends and Family: Not on file  . Frequency of Social Gatherings with Friends and Family: Not on file  . Attends Religious Services: Not on file  . Active Member of Clubs or Organizations: Not on file  . Attends Archivist Meetings: Not on file  . Marital Status: Not on file  Intimate Partner Violence:   . Fear of Current or Ex-Partner: Not on file  . Emotionally Abused: Not on file  .  Physically Abused: Not on file  . Sexually Abused: Not on file     Current Outpatient Medications:  .  ACCU-CHEK SOFTCLIX LANCETS lancets, Check sugars TID DX:E11.42 LON:99, Disp: 100 each, Rfl: 12 .  albuterol (PROVENTIL HFA;VENTOLIN HFA) 108 (90 Base) MCG/ACT inhaler, Inhale 2 puffs into the lungs every 4 (four) hours as needed for wheezing or shortness of breath., Disp: 1 Inhaler, Rfl: 1 .  amLODipine (NORVASC) 10 MG tablet, TAKE 1 TABLET BY MOUTH ONCE DAILY, Disp: 90 tablet, Rfl: 0 .  atorvastatin (LIPITOR) 80 MG tablet, TAKE 1 TABLET BY MOUTH  DAILY FOR CHOLESTEROL, Disp: 90 tablet, Rfl: 1 .  carvedilol (COREG) 6.25 MG tablet, TAKE 1 TABLET BY MOUTH  TWICE DAILY WITH A MEAL  (REPLACES METOPROLOL), Disp: 180 tablet, Rfl: 1 .  ezetimibe (ZETIA) 10 MG tablet, Take 1 tablet (10 mg total) by mouth daily., Disp: 90 tablet, Rfl: 3 .  ferrous sulfate 325 (65 FE) MG tablet, Take 325 mg by mouth daily with breakfast., Disp: , Rfl:  .  gabapentin (NEURONTIN) 300 MG capsule, Take 1 tablet in the morning and 1 tablet in the afternoon.  Take 2 tablets before bed., Disp: 360 capsule, Rfl: 1 .  glucose blood (ACCU-CHEK AVIVA PLUS) test strip, Check sugars tid DX:E11.42 LON:99, Disp: 100 each, Rfl: 12 .  hydrALAZINE (APRESOLINE) 100 MG  tablet, TAKE 1 TABLET BY MOUTH 3  TIMES DAILY, Disp: 270 tablet, Rfl: 2 .  Insulin Glargine (LANTUS SOLOSTAR) 100 UNIT/ML Solostar Pen, Inject 50 Units into the skin daily., Disp: 5 pen, Rfl: 3 .  Insulin Pen Needle (NOVOFINE) 32G X 6 MM MISC, 1 each by Does not apply route daily., Disp: 100 each, Rfl: 3 .  losartan-hydrochlorothiazide (HYZAAR) 50-12.5 MG tablet, TAKE 1 TABLET BY MOUTH  DAILY, Disp: 90 tablet, Rfl: 3 .  Multiple Vitamin (MULTIVITAMIN) tablet, Take 1 tablet by mouth daily., Disp: , Rfl:  .  Semaglutide, 1 MG/DOSE, (OZEMPIC, 1 MG/DOSE,) 2 MG/1.5ML SOPN, Inject 1 mg into the skin once a week., Disp: 12 pen, Rfl: 1 .  venlafaxine XR (EFFEXOR XR) 37.5 MG 24 hr capsule, Take 1 capsule (37.5 mg total) by mouth daily with breakfast., Disp: 90 capsule, Rfl: 1 .  metroNIDAZOLE (METROGEL) 0.75 % gel, 2 (two) times daily as needed for rash., Disp: , Rfl:   No Known Allergies  I personally reviewed active problem list, medication list, allergies, health maintenance, notes from last encounter, lab results with the patient/caregiver today.   ROS  Constitutional: Negative for fever or weight change.  Respiratory: Negative for cough and shortness of breath.   Cardiovascular: Negative for chest pain or palpitations.  Gastrointestinal: Negative for abdominal pain, no bowel changes.  Musculoskeletal: Negative for gait problem or joint swelling.  Skin: Negative for rash.  Neurological: Negative for dizziness or headache.  No other specific complaints in a complete review of systems (except as listed in HPI above).  Objective  Vitals:   11/10/19 0822  BP: 132/72  Pulse: 97  Resp: 14  Temp: 97.6 F (36.4 C)  TempSrc: Temporal  SpO2: 94%  Weight: 199 lb 3.2 oz (90.4 kg)  Height: 5\' 3"  (1.6 m)    Body mass index is 35.29 kg/m.  Physical Exam  Constitutional: Patient appears well-developed and well-nourished. No distress.  HENT: Head: Normocephalic and atraumatic. Eyes:  Conjunctivae and EOM are normal. Neck: Normal range of motion. Neck supple. No JVD present. No thyromegaly present.  Cardiovascular: Normal rate, regular rhythm and  normal heart sounds.  No murmur heard. No BLE edema. Pulmonary/Chest: Effort normal and breath sounds normal. No respiratory distress. FEMALE GENITALIA:  External genitalia normal except for a small cystic follicular lesion on the left lateral labia majora - appears to be folliculitis in later stages of healing.  There are no herpetic appearing lesions internally or externally.  External urethra normal Vaginal vault normal without discharge or lesions Musculoskeletal: Normal range of motion, no joint effusions. No gross deformities Neurological: he is alert and oriented to person, place, and time. No cranial nerve deficit. Coordination, balance, strength, speech and gait are normal.  Skin: Skin is warm and dry. No rash noted. No erythema.  Psychiatric: Patient has a normal mood and affect. behavior is normal. Judgment and thought content normal.  No results found for this or any previous visit (from the past 72 hour(s)).  PHQ2/9: Depression screen [facility] 2/9 11/10/2019 08/25/2019 08/10/2019 04/10/2019 12/02/2018  Decreased Interest 0 0 0 0 0  Down, Depressed, Hopeless 0 0 0 0 0  PHQ - 2 Score 0 0 0 0 0  Altered sleeping 0 0 0 0 0  Tired, decreased energy 0 0 0 0 0  Change in appetite 0 0 0 0 0  Feeling bad or failure about yourself  0 0 0 0 0  Trouble concentrating 0 0 0 0 0  Moving slowly or fidgety/restless 0 0 0 0 0  Suicidal thoughts 0 0 0 0 0  PHQ-9 Score 0 0 0 0 0  Difficult doing work/chores Not difficult at all Not difficult at all Not difficult at all Not difficult at all Not difficult at all   PHQ-2/9 Result is negative.    Fall Risk: Fall Risk  11/10/2019 08/25/2019 08/10/2019 04/10/2019 12/02/2018  Falls in the past year? 0 0 1 0 0  Number falls in past yr: 0 0 1 0 -  Injury with Fall? 0 0 1 0 -  Follow up Falls  evaluation completed Falls evaluation completed Falls evaluation completed Falls evaluation completed -    Assessment & Plan  1. Essential hypertension - Continue current regimen - COMPLETE METABOLIC PANEL WITH GFR  2. Mild intermittent asthma without complication - Stable, no concerns today  3. Stage 3b chronic kidney disease - Seeing nephrology for management - COMPLETE METABOLIC PANEL WITH GFR  4. Type 2 diabetes mellitus with hyperglycemia, with long-term current use of insulin (HCC) - Compliant with regimen, reported FBS are within goal, due for labs today - Hemoglobin A1c - COMPLETE METABOLIC PANEL WITH GFR  5. Hyperlipidemia associated with type 2 diabetes mellitus ([place]) - Continue statin and zetia  6. Lumbar radiculopathy - cyclobenzaprine (FLEXERIL) 5 MG tablet; Take 1 tablet (5 mg total) by mouth 3 (three) times daily as needed for muscle spasms.  Dispense: 30 tablet; Refill: 1  7. Herpes simplex vulvovaginitis - Will provide PRN for future outbreaks - valACYclovir (VALTREX) 500 MG tablet; Take 1 tablet twice daily for 3 days immediately after onset of outbreak.  Dispense: 30 tablet; Refill: 1  8. Routine screening for STI (sexually transmitted infection) - Cervicovaginal ancillary only - HIV Antibody (routine testing w rflx) - RPR

## 2019-11-11 ENCOUNTER — Encounter: Payer: Self-pay | Admitting: Family Medicine

## 2019-11-11 LAB — CERVICOVAGINAL ANCILLARY ONLY
Bacterial Vaginitis (gardnerella): POSITIVE — AB
Candida Glabrata: NEGATIVE
Candida Vaginitis: NEGATIVE
Chlamydia: NEGATIVE
Comment: NEGATIVE
Comment: NEGATIVE
Comment: NEGATIVE
Comment: NEGATIVE
Comment: NEGATIVE
Comment: NORMAL
Neisseria Gonorrhea: NEGATIVE
Trichomonas: NEGATIVE

## 2019-11-12 ENCOUNTER — Other Ambulatory Visit: Payer: Self-pay | Admitting: Family Medicine

## 2019-11-12 DIAGNOSIS — B9689 Other specified bacterial agents as the cause of diseases classified elsewhere: Secondary | ICD-10-CM

## 2019-11-12 MED ORDER — METRONIDAZOLE 500 MG PO TABS: 500.0000 mg | ORAL_TABLET | Freq: Two times a day (BID) | ORAL | 0 refills | Status: AC

## 2019-11-13 LAB — COMPLETE METABOLIC PANEL WITH GFR
AG Ratio: 1.2 (calc) (ref 1.0–2.5)
ALT: 13 U/L (ref 6–29)
AST: 14 U/L (ref 10–35)
Albumin: 3.9 g/dL (ref 3.6–5.1)
Alkaline phosphatase (APISO): 81 U/L (ref 37–153)
BUN/Creatinine Ratio: 16 (calc) (ref 6–22)
BUN: 27 mg/dL — ABNORMAL HIGH (ref 7–25)
CO2: 29 mmol/L (ref 20–32)
Calcium: 9.6 mg/dL (ref 8.6–10.4)
Chloride: 105 mmol/L (ref 98–110)
Creat: 1.73 mg/dL — ABNORMAL HIGH (ref 0.50–1.05)
GFR, Est African American: 39 mL/min/{1.73_m2} — ABNORMAL LOW (ref >=60)
GFR, Est Non African American: 34 mL/min/{1.73_m2} — ABNORMAL LOW (ref >=60)
Globulin: 3.3 g/dL (calc) (ref 1.9–3.7)
Glucose, Bld: 88 mg/dL (ref 65–99)
Potassium: 4 mmol/L (ref 3.5–5.3)
Sodium: 141 mmol/L (ref 135–146)
Total Bilirubin: 0.4 mg/dL (ref 0.2–1.2)
Total Protein: 7.2 g/dL (ref 6.1–8.1)

## 2019-11-13 LAB — HEMOGLOBIN A1C
Hgb A1c MFr Bld: 7.2 % of total Hgb — ABNORMAL HIGH (ref <5.7)
Mean Plasma Glucose: 160 (calc)
eAG (mmol/L): 8.9 (calc)

## 2019-11-13 LAB — RPR: RPR Ser Ql: NONREACTIVE

## 2019-11-13 LAB — HIV ANTIBODY (ROUTINE TESTING W REFLEX): HIV 1&2 Ab, 4th Generation: NONREACTIVE

## 2019-11-20 ENCOUNTER — Other Ambulatory Visit: Payer: Self-pay | Admitting: Family Medicine

## 2019-11-20 DIAGNOSIS — A6004 Herpesviral vulvovaginitis: Secondary | ICD-10-CM

## 2019-11-22 ENCOUNTER — Other Ambulatory Visit: Payer: Self-pay | Admitting: Family Medicine

## 2019-11-22 DIAGNOSIS — A6004 Herpesviral vulvovaginitis: Secondary | ICD-10-CM

## 2019-12-07 ENCOUNTER — Other Ambulatory Visit: Payer: Self-pay | Admitting: Family Medicine

## 2019-12-07 DIAGNOSIS — N951 Menopausal and female climacteric states: Secondary | ICD-10-CM

## 2019-12-08 NOTE — Telephone Encounter (Signed)
Requested Prescriptions  Pending Prescriptions Disp Refills  . venlafaxine XR (EFFEXOR-XR) 37.5 MG 24 hr capsule [Pharmacy Med Name: VENLAFAXINE  37.5MG   CAP  XR] 90 capsule 3    Sig: TAKE 1 CAPSULE BY MOUTH  DAILY WITH BREAKFAST     Psychiatry: Antidepressants - SNRI - desvenlafaxine & venlafaxine Passed - 12/07/2019 10:21 PM      Passed - LDL in normal range and within 360 days    LDL Cholesterol (Calc)  Date Value Ref Range Status  08/10/2019 66 mg/dL (calc) Final    Comment:    Reference range: <100 . Desirable range <100 mg/dL for primary prevention;   <70 mg/dL for patients with CHD or diabetic patients  with > or = 2 CHD risk factors. Marland Kitchen LDL-C is now calculated using the Martin-Hopkins  calculation, which is a validated novel method providing  better accuracy than the Friedewald equation in the  estimation of LDL-C.  Cresenciano Genre et al. Annamaria Helling. 3710;626(94): 2061-2068  (http://education.[facility].com/faq/FAQ164)          Passed - Total Cholesterol in normal range and within 360 days    Cholesterol  Date Value Ref Range Status  08/10/2019 126 <200 mg/dL Final         Passed - Triglycerides in normal range and within 360 days    Triglycerides  Date Value Ref Range Status  08/10/2019 123 <150 mg/dL Final         Passed - Last BP in normal range    BP Readings from Last 1 Encounters:  11/10/19 132/72         Passed - Valid encounter within last 6 months    Recent Outpatient Visits          4 weeks ago Essential hypertension   [place], FNP   3 months ago Uncontrolled type 2 diabetes mellitus with autonomic neuropathy [facility])   [place], FNP   4 months ago Need for influenza vaccination   [place], FNP   8 months ago DM type 2 with diabetic peripheral neuropathy [facility])   [place], FNP   1 year ago Malignant  hypertension   [facility] [place], Satira Anis, MD

## 2020-01-18 ENCOUNTER — Encounter: Payer: Self-pay | Admitting: Family Medicine

## 2020-02-02 ENCOUNTER — Other Ambulatory Visit: Payer: Self-pay | Admitting: Family Medicine

## 2020-02-02 DIAGNOSIS — E1142 Type 2 diabetes mellitus with diabetic polyneuropathy: Secondary | ICD-10-CM

## 2020-02-02 NOTE — Telephone Encounter (Signed)
Requested Prescriptions  Pending Prescriptions Disp Refills  . gabapentin (NEURONTIN) 300 MG capsule [Pharmacy Med Name: GABAPENTIN 300MG  CAPSULE] 360 capsule 3    Sig: TAKE 1 CAPSULE BY MOUTH IN  THE MORNING AND 1 CAPSULE  BY MOUTH IN THE AFTERNOON  TAKE 2 CAPSULES BY MOUTH  BEFORE BEDTIME     Neurology: Anticonvulsants - gabapentin Passed - 02/02/2020 10:11 PM      Passed - Valid encounter within last 12 months    Recent Outpatient Visits          2 months ago Essential hypertension   [place], [place], FNP   5 months ago Uncontrolled type 2 diabetes mellitus with autonomic neuropathy [facility])   [place], FNP   5 months ago Need for influenza vaccination   [place], FNP   9 months ago DM type 2 with diabetic peripheral neuropathy [facility])   [place], FNP   1 year ago Malignant hypertension   [facility] [place], Satira Anis, MD

## 2020-02-25 ENCOUNTER — Other Ambulatory Visit: Payer: Self-pay | Admitting: Family Medicine

## 2020-02-25 ENCOUNTER — Ambulatory Visit: Payer: 59 | Admitting: Family Medicine

## 2020-02-25 ENCOUNTER — Encounter: Payer: Self-pay | Admitting: Family Medicine

## 2020-02-25 ENCOUNTER — Other Ambulatory Visit: Payer: Self-pay

## 2020-02-25 VITALS — BP 160/84 | HR 94 | Temp 97.8°F | Resp 14 | Ht 63.0 in | Wt 203.7 lb

## 2020-02-25 DIAGNOSIS — R109 Unspecified abdominal pain: Secondary | ICD-10-CM

## 2020-02-25 DIAGNOSIS — B029 Zoster without complications: Secondary | ICD-10-CM

## 2020-02-25 DIAGNOSIS — E1142 Type 2 diabetes mellitus with diabetic polyneuropathy: Secondary | ICD-10-CM

## 2020-02-25 LAB — POCT URINALYSIS DIPSTICK
Bilirubin, UA: NEGATIVE
Blood, UA: NEGATIVE
Glucose, UA: NEGATIVE
Ketones, UA: NEGATIVE
Leukocytes, UA: NEGATIVE
Nitrite, UA: NEGATIVE
Odor: NORMAL
Protein, UA: POSITIVE — AB
Spec Grav, UA: 1.02 (ref 1.010–1.025)
Urobilinogen, UA: 0.2 E.U./dL
pH, UA: 6 (ref 5.0–8.0)

## 2020-02-25 MED ORDER — OXYCODONE-ACETAMINOPHEN 10-325 MG PO TABS: 0.5000 | ORAL_TABLET | Freq: Four times a day (QID) | ORAL | 0 refills | Status: AC | PRN

## 2020-02-25 MED ORDER — KETOROLAC TROMETHAMINE 60 MG/2ML IM SOLN
60.0000 mg | Freq: Once | INTRAMUSCULAR | Status: AC
  Administered 2020-02-25: 30 mg via INTRAMUSCULAR

## 2020-02-25 MED ORDER — PREDNISONE 10 MG PO TABS: ORAL_TABLET | ORAL | 0 refills | Status: DC

## 2020-02-25 MED ORDER — VALACYCLOVIR HCL 1 G PO TABS: 1000.0000 mg | ORAL_TABLET | Freq: Three times a day (TID) | ORAL | 0 refills | Status: AC

## 2020-02-25 NOTE — Progress Notes (Signed)
Patient ID: Janice Jennings, female    DOB: 15-Jun-1971, 49 y.o.   MRN: [id number]  PCP: Hubbard Hartshorn, FNP  Chief Complaint  Patient presents with  . Flank Pain    right onset 1 week    Subjective:   Janice Jennings is a 49 y.o. female, presents to clinic with CC of the following:  Back Pain This is a new problem. The current episode started in the past 7 days. The problem occurs constantly. The problem has been gradually worsening since onset. Pain location: Left lower thoracic and upper lumbar back and left flank area wrapping around her body on the left side. The quality of the pain is described as burning, shooting and stabbing. The pain is severe. The pain is worse during the night. Exacerbated by: Any movement palpation or even light touch of her close. Stiffness is present: n/a. Pertinent negatives include no abdominal pain, bladder incontinence, bowel incontinence, chest pain, dysuria, fever, headaches, leg pain, numbness, paresis, paresthesias, pelvic pain, perianal numbness, tingling, weakness or weight loss. Risk factors include sedentary lifestyle, poor posture, lack of exercise and obesity. She has tried NSAIDs, walking and ice for the symptoms. The treatment provided no relief.     Patient Active Problem List   Diagnosis Date Noted  . Herpes simplex vulvovaginitis 11/10/2019  . Lumbar radiculopathy 11/10/2019  . Type 2 diabetes mellitus with hyperglycemia, with long-term current use of insulin ([place]) 09/22/2019  . Type 2 diabetes mellitus with stage 3b chronic kidney disease, with long-term current use of insulin ([place]) 09/22/2019  . Hyperlipidemia associated with type 2 diabetes mellitus ([place]) 08/10/2019  . Stage 3b chronic kidney disease 08/10/2019  . Hemoptysis 11/15/2018  . Asthma   . Acute respiratory failure with hypoxia ([place])   . Morbid obesity ([place]) 09/22/2018  . Anemia 06/19/2018  . Diabetic retinopathy of both eyes associated with type 2 diabetes mellitus ([place])  04/11/2018  . Essential hypertension 03/07/2015      Current Outpatient Medications:  .  ACCU-CHEK SOFTCLIX LANCETS lancets, Check sugars TID DX:E11.42 LON:99, Disp: 100 each, Rfl: 12 .  albuterol (PROVENTIL HFA;VENTOLIN HFA) 108 (90 Base) MCG/ACT inhaler, Inhale 2 puffs into the lungs every 4 (four) hours as needed for wheezing or shortness of breath., Disp: 1 Inhaler, Rfl: 1 .  amLODipine (NORVASC) 10 MG tablet, TAKE 1 TABLET BY MOUTH ONCE DAILY, Disp: 90 tablet, Rfl: 0 .  atorvastatin (LIPITOR) 80 MG tablet, TAKE 1 TABLET BY MOUTH  DAILY FOR CHOLESTEROL, Disp: 90 tablet, Rfl: 1 .  carvedilol (COREG) 6.25 MG tablet, TAKE 1 TABLET BY MOUTH  TWICE DAILY WITH A MEAL  (REPLACES METOPROLOL), Disp: 180 tablet, Rfl: 1 .  cyclobenzaprine (FLEXERIL) 5 MG tablet, Take 1 tablet (5 mg total) by mouth 3 (three) times daily as needed for muscle spasms., Disp: 30 tablet, Rfl: 1 .  ezetimibe (ZETIA) 10 MG tablet, Take 1 tablet (10 mg total) by mouth daily., Disp: 90 tablet, Rfl: 3 .  ferrous sulfate 325 (65 FE) MG tablet, Take 325 mg by mouth daily with breakfast., Disp: , Rfl:  .  gabapentin (NEURONTIN) 300 MG capsule, TAKE 1 CAPSULE BY MOUTH IN  THE MORNING AND 1 CAPSULE  BY MOUTH IN THE AFTERNOON  TAKE 2 CAPSULES BY MOUTH  BEFORE BEDTIME, Disp: 360 capsule, Rfl: 3 .  glucose blood (ACCU-CHEK AVIVA PLUS) test strip, Check sugars tid DX:E11.42 LON:99, Disp: 100 each, Rfl: 12 .  hydrALAZINE (APRESOLINE) 100 MG tablet, TAKE 1 TABLET BY  MOUTH 3  TIMES DAILY, Disp: 270 tablet, Rfl: 2 .  Insulin Pen Needle (NOVOFINE) 32G X 6 MM MISC, 1 each by Does not apply route daily., Disp: 100 each, Rfl: 3 .  LANTUS SOLOSTAR 100 UNIT/ML Solostar Pen, INJECT 50 UNITS INTO THE SKIN DAILY., Disp: 15 mL, Rfl: 3 .  losartan-hydrochlorothiazide (HYZAAR) 50-12.5 MG tablet, TAKE 1 TABLET BY MOUTH  DAILY, Disp: 90 tablet, Rfl: 3 .  Multiple Vitamin (MULTIVITAMIN) tablet, Take 1 tablet by mouth daily., Disp: , Rfl:  .  OZEMPIC, 1  MG/DOSE, 2 MG/1.5ML SOPN, INJECT 1 MG INTO THE SKIN ONCE A WEEK., Disp: 3 pen, Rfl: 0 .  valACYclovir (VALTREX) 500 MG tablet, TAKE 1 TABLET TWICE DAILY FOR 3 DAYS IMMEDIATELY AFTER ONSET OF OUTBREAK., Disp: 30 tablet, Rfl: 1 .  venlafaxine XR (EFFEXOR-XR) 37.5 MG 24 hr capsule, TAKE 1 CAPSULE BY MOUTH  DAILY WITH BREAKFAST, Disp: 90 capsule, Rfl: 3   No Known Allergies   Family History  Problem Relation Age of Onset  . Diabetes Father   . Hyperlipidemia Father   . Hypertension Father   . Cancer Maternal Aunt 66       leukemia - died  . Diabetes Maternal Grandmother   . Cancer Maternal Grandmother        breast cancer  . Stroke Maternal Grandmother   . Breast cancer Maternal Grandmother   . Diabetes Daughter        Pre-diabetes  . Diabetes Maternal Grandfather   . Hyperlipidemia Maternal Grandfather   . Hypertension Maternal Grandfather   . Cancer Maternal Aunt 50       breast cancer - died  . Ovarian cancer Neg Hx   . Colon cancer Neg Hx      Social History   Socioeconomic History  . Marital status: Single    Spouse name: Not on file  . Number of children: 2  . Years of education: 99  . Highest education level: Not on file  Occupational History  . Occupation: Social worker at [employer]: [employer]  Tobacco Use  . Smoking status: Never Smoker  . Smokeless tobacco: Never Used  Substance and Sexual Activity  . Alcohol use: Yes    Alcohol/week: 0.0 standard drinks    Comment: occ  . Drug use: No  . Sexual activity: Not on file  Other Topics Concern  . Not on file  Social History Narrative   Ebonique grew up in [place], [place] and also in [place]. She attending the [employer] and obtained her Bachelors in Psychology. She then attended [employer] and obtained her Masters in Psychology. 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 She lives at home with her two children, Darius age 13 and [place] age  76. They have a dog named Coco who she describes as having separation anxiety. Loni enjoys activities with the children. They enjoy outdoor activities, watching movies.     Social Determinants of Health   Financial Resource Strain:   . Difficulty of Paying Living Expenses:   Food Insecurity:   . Worried About Charity fundraiser in the Last Year:   . Arboriculturist in the Last Year:   Transportation Needs:   . Film/video editor (Medical):   Marland Kitchen Lack of Transportation (Non-Medical):   Physical Activity:   . Days of Exercise per Week:   . Minutes of Exercise per Session:   Stress:   .  Feeling of Stress :   Social Connections:   . Frequency of Communication with Friends and Family:   . Frequency of Social Gatherings with Friends and Family:   . Attends Religious Services:   . Active Member of Clubs or Organizations:   . Attends Archivist Meetings:   Marland Kitchen Marital Status:   Intimate Partner Violence:   . Fear of Current or Ex-Partner:   . Emotionally Abused:   Marland Kitchen Physically Abused:   . Sexually Abused:     Chart Review Today: I personally reviewed active problem list, medication list, allergies, family history, social history, health maintenance, notes from last encounter, lab results, imaging with the patient/caregiver today.   Review of Systems  Constitutional: Negative.  Negative for fever and weight loss.  HENT: Negative.   Eyes: Negative.   Respiratory: Negative.   Cardiovascular: Negative.  Negative for chest pain.  Gastrointestinal: Negative.  Negative for abdominal pain and bowel incontinence.  Endocrine: Negative.   Genitourinary: Negative.  Negative for bladder incontinence, dysuria and pelvic pain.  Musculoskeletal: Positive for back pain.  Skin: Negative.   Allergic/Immunologic: Negative.   Neurological: Negative.  Negative for tingling, weakness, numbness, headaches and paresthesias.  Hematological: Negative.   Psychiatric/Behavioral: Negative.     All other systems reviewed and are negative.      Objective:   Vitals:   02/25/20 0911  BP: (!) 160/84  Pulse: 94  Resp: 14  Temp: 97.8 F (36.6 C)  SpO2: 98%  Weight: 203 lb 11.2 oz (92.4 kg)  Height: 5\' 3"  (1.6 m)    Body mass index is 36.08 kg/m.  Physical Exam Vitals and nursing note reviewed.  Constitutional:      Appearance: Normal appearance. She is obese. She is not ill-appearing, toxic-appearing or diaphoretic.     Comments: Very uncomfortable appearing woman appears nontoxic and stated age writhing in pain in the exam room  HENT:     Head: Normocephalic and atraumatic.  Eyes:     General:        Right eye: No discharge.     Conjunctiva/sclera: Conjunctivae normal.  Cardiovascular:     Rate and Rhythm: Normal rate and regular rhythm.     Pulses: Normal pulses.     Heart sounds: Normal heart sounds. No murmur. No friction rub. No gallop.   Pulmonary:     Effort: Pulmonary effort is normal. No respiratory distress.     Breath sounds: Normal breath sounds. No stridor. No wheezing.  Abdominal:     General: Bowel sounds are normal. There is no distension.     Palpations: Abdomen is soft.     Tenderness: There is no abdominal tenderness. There is no right CVA tenderness, left CVA tenderness or guarding.  Skin:    Capillary Refill: Capillary refill takes less than 2 seconds.     Findings: Erythema and rash present.     Comments: scattered erythematous papular rash in unilateral distribution not crossing midline back, with the band about 4 cm wide, no current vesicles or crusting, very hypersensitive to palpation  Neurological:     Mental Status: She is alert. Mental status is at baseline.     Gait: Gait normal.  Psychiatric:        Mood and Affect: Mood normal.        Behavior: Behavior normal.      Results for orders placed or performed in visit on 02/25/20  POCT urinalysis dipstick  Result Value Ref Range  Color, UA lt yellow    Clarity, UA clear     Glucose, UA Negative Negative   Bilirubin, UA neg    Ketones, UA neg    Spec Grav, UA 1.020 1.010 - 1.025   Blood, UA neg    pH, UA 6.0 5.0 - 8.0   Protein, UA Positive (A) Negative   Urobilinogen, UA 0.2 0.2 or 1.0 E.U./dL   Nitrite, UA neg    Leukocytes, UA Negative Negative   Appearance clear    Odor normal         Assessment & Plan:   Patient is a 49 year old woman presents with 1 week of left back to left flank pain no injury and no urinary symptoms but patient is in severe pain and very uncomfortable, is worse with movement or palpation even light touch is very sensitive to the area on her back.  Urinalysis was done here was unremarkable, she has no abdominal tenderness or CVA tenderness did suspect and note that when I lifted up the back of her should she did have a unilateral erythematous rash in a somewhat linear distribution most consistent with herpes zoster, her pain likely preceded the rash there is currently no vesicles, unfortunately her pain is extremely severe and has been gradually worsening for about a week, we will start treatment with Valtrex, she is already on gabapentin 300 mg in the morning, at lunch and then 600 mg in the evening I encouraged her to increase her dosing by 300 mg in the morning for several days and if needed increase to an additional 300 mg for total of 600 mg 3 times daily for pain.   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 I did prescribe for her narcotic pain medication after a lengthy discussion about risk and benefit.  Controlled substance database was checked and was empty without any recent or remote prescribe narcotic medications or any other controlled substance for that matter.  Discussed with her  laws that prohibit lengthy Scripts -I explained that I could only give her about 3 to 5 days worth and for any further need that she feels she has 4 pain medicine she would need to present back for an office visit to reassess.  Since we want to check on her rash and see if she needs to be out of work longer or cleared back to work did suggest that she does a follow-up visit early next week    1. Herpes zoster without complication - valACYclovir (VALTREX) 1000 MG tablet; Take 1 tablet (1,000 mg total) by mouth 3 (three) times daily for 10 days.  Dispense: 30 tablet; Refill: 0 - predniSONE (DELTASONE) 10 MG tablet; 6 tabs poqd 1-2, 5 poqd 3-4.Marland Kitchen1 tab poqd 11-12  Dispense: 42 tablet; Refill: 0 - oxyCODONE-acetaminophen (PERCOCET) 10-325 MG tablet; Take 0.5-1 tablets by mouth every 6 (six) hours as needed for up to 5 days for pain (severe pain, use sparingly and with stool softener).  Dispense: 20 tablet; Refill: 0  2. Flank pain Due to #1, I doubt any additional muscle skeletal problems, back strain, kidney stone or pyelonephritis etc. - POCT urinalysis dipstick - Urine Culture - ketorolac (TORADOL) injection 60 mg  Patient was given a Toradol injection in clinic because of her severe  pain and appearing very uncomfortable and borderline distressed. Through review medications and process with her as noted below:  Start Valtrex and prednisone today  When you use the narcotic pain medicine use a stool softener, and be careful of sedation, dependence, etc.  Can make you drowsy, can be addictive and can suppress your breathing, there is risk of addiction, overdose and death with even using for short periods of time.  Use lowest dose.  Must follow up in 5 days if you feel you need more medicine.   Work note given excusing her until 03/01/2020.  Delsa Grana, PA-C 02/25/20 9:22 AM

## 2020-02-25 NOTE — Patient Instructions (Addendum)
Start Valtrex and prednisone today  When you use the narcotic pain medicine use a stool softener, and be careful of sedation, dependence, etc.  Can make you drowsy, can be addictive and can suppress your breathing, there is risk of addiction, overdose and death with even using for short periods of time.  Use lowest dose.  Must follow up in 5 days if you feel you need more medicine.   Shingles   Shingles is an infection. It gives you a painful skin rash and blisters that have fluid in them. Shingles is caused by the same germ (virus) that causes chickenpox. Shingles only happens in people who:  Have had chickenpox.  Have been given a shot of medicine (vaccine) to protect against chickenpox. Shingles is rare in this group. The first symptoms of shingles may be itching, tingling, or pain in an area on your skin. A rash will show on your skin a few days or weeks later. The rash is likely to be on one side of your body. The rash usually has a shape like a belt or a band. Over time, the rash turns into fluid-filled blisters. The blisters will break open, change into scabs, and dry up. Medicines may:  Help with pain and itching.  Help you get better sooner.  Help to prevent long-term problems. Follow these instructions at home: Medicines  Take over-the-counter and prescription medicines only as told by your doctor.  Put on an anti-itch cream or numbing cream where you have a rash, blisters, or scabs. Do this as told by your doctor. Helping with itching and discomfort   Put cold, wet cloths (cold compresses) on the area of the rash or blisters as told by your doctor.  Cool baths can help you feel better. Try adding baking soda or dry oatmeal to the water to lessen itching. Do not bathe in hot water. Blister and rash care  Keep your rash covered with a loose bandage (dressing).  Wear loose clothing that does not rub on your rash.  Keep your rash and blisters clean. To do this, wash the  area with mild soap and cool water as told by your doctor.  Check your rash every day for signs of infection. Check for: ? More redness, swelling, or pain. ? Fluid or blood. ? Warmth. ? Pus or a bad smell.  Do not scratch your rash. Do not pick at your blisters. To help you to not scratch: ? Keep your fingernails clean and cut short. ? Wear gloves or mittens when you sleep, if scratching is a problem. General instructions  Rest as told by your doctor.  Keep all follow-up visits as told by your doctor. This is important.  Wash your hands often with soap and water. If soap and water are not available, use hand sanitizer. Doing this lowers your chance of getting a skin infection caused by germs (bacteria).  Your infection can cause chickenpox in people who have never had chickenpox or never got a shot of chickenpox vaccine. If you have blisters that did not change into scabs yet, try not to touch other people or be around other people, especially: ? Babies. ? Pregnant women. ? Children who have areas of red, itchy, or rough skin (eczema). ? Very old people who have transplants. ? People who have a long-term (chronic) sickness, like cancer or AIDS. Contact a doctor if:  Your pain does not get better with medicine.  Your pain does not get better after the rash heals.  You have any signs of infection in the rash area. These signs include: ? More redness, swelling, or pain around the rash. ? Fluid or blood coming from the rash. ? The rash area feeling warm to the touch. ? Pus or a bad smell coming from the rash. Get help right away if:  The rash is on your face or nose.  You have pain in your face or pain by your eye.  You lose feeling on one side of your face.  You have trouble seeing.  You have ear pain, or you have ringing in your ear.  You have a loss of taste.  Your condition gets worse. Summary  Shingles gives you a painful skin rash and blisters that have fluid in  them.  Shingles is an infection. It is caused by the same germ (virus) that causes chickenpox.  Keep your rash covered with a loose bandage (dressing). Wear loose clothing that does not rub on your rash.  If you have blisters that did not change into scabs yet, try not to touch other people or be around people. This information is not intended to replace advice given to you by your health care provider. Make sure you discuss any questions you have with your health care provider. Document Revised: 02/06/2019 Document Reviewed: 06/19/2017 Elsevier Patient Education  2020 [employer].

## 2020-02-26 LAB — URINE CULTURE
MICRO NUMBER:: 10422861
Result:: NO GROWTH
SPECIMEN QUALITY:: ADEQUATE

## 2020-02-28 ENCOUNTER — Encounter: Payer: Self-pay | Admitting: Family Medicine

## 2020-02-29 ENCOUNTER — Other Ambulatory Visit: Payer: Self-pay | Admitting: Family Medicine

## 2020-02-29 ENCOUNTER — Encounter: Payer: Self-pay | Admitting: Family Medicine

## 2020-02-29 NOTE — Telephone Encounter (Signed)
Please give her a work note excusing her additionally from today - 02/29/2020 to 03/04/2020.   We will determine any further absences going forward after OV. Virtual appts are okay if she can show [place] rash on phone. I will have to look at her dosing of controlled substances, constipation is expected, narcotics should be used very sparingly due to SE and risks - should have been increasing gabapentin by an extra pill for 1-2 days then increase dose by an additional pill for several days (would get her to 2 pills TID)   There is not much else topical that I can prescribe her.  Esp should not be rubbed over vescicles when opening and crusting.  Cold compresses, topical numbing or diclofenac gel OTC can help.

## 2020-03-01 ENCOUNTER — Encounter: Payer: Self-pay | Admitting: Family Medicine

## 2020-03-03 ENCOUNTER — Other Ambulatory Visit: Payer: Self-pay

## 2020-03-03 ENCOUNTER — Telehealth (INDEPENDENT_AMBULATORY_CARE_PROVIDER_SITE_OTHER): Payer: 59 | Admitting: Family Medicine

## 2020-03-03 ENCOUNTER — Encounter: Payer: Self-pay | Admitting: Family Medicine

## 2020-03-03 VITALS — Ht 64.0 in | Wt 203.0 lb

## 2020-03-03 DIAGNOSIS — B0229 Other postherpetic nervous system involvement: Secondary | ICD-10-CM

## 2020-03-03 DIAGNOSIS — N951 Menopausal and female climacteric states: Secondary | ICD-10-CM | POA: Diagnosis not present

## 2020-03-03 MED ORDER — VENLAFAXINE HCL ER 37.5 MG PO CP24: 75.0000 mg | ORAL_CAPSULE | Freq: Every day | ORAL | 0 refills | Status: DC

## 2020-03-03 MED ORDER — LIDOCAINE HCL URETHRAL/MUCOSAL 2 % EX PRSY: 1.0000 "application " | PREFILLED_SYRINGE | Freq: Three times a day (TID) | CUTANEOUS | 1 refills | Status: DC | PRN

## 2020-03-03 NOTE — Progress Notes (Signed)
Name: Janice Jennings   MRN: [id number]    DOB: Dec 31, 1970   Date:03/03/2020       Progress Note  Subjective:    Chief Complaint  Chief Complaint  Patient presents with  . Herpes Zoster    I connected with  [name]  on 03/03/20 at  2:00 PM EDT by a video enabled telemedicine application and verified that I am speaking with the correct person using two identifiers.  I discussed the limitations of evaluation and management by telemedicine and the availability of in person appointments. The patient expressed understanding and agreed to proceed. Staff also discussed with the patient that there may be a patient responsible charge related to this service. Patient Location: home Provider Location: [facility] Additional Individuals present: none  HPI Presents for f/up on herpes Zoster/shingles - rash is improving but still having severe pain  She did increase gabapentin  Its really painful, I can barely walk and I can't bend down, she tried to walk to her mailbox and she's exhausted afterwards.  Pain wraps around her right side and makes it hard to breath.   Pain meds only help a little.  Patient Active Problem List   Diagnosis Date Noted  . Herpes simplex vulvovaginitis 11/10/2019  . Lumbar radiculopathy 11/10/2019  . Type 2 diabetes mellitus with hyperglycemia, with long-term current use of insulin ([place]) 09/22/2019  . Type 2 diabetes mellitus with stage 3b chronic kidney disease, with long-term current use of insulin ([place]) 09/22/2019  . Hyperlipidemia associated with type 2 diabetes mellitus ([place]) 08/10/2019  . Stage 3b chronic kidney disease 08/10/2019  . Hemoptysis 11/15/2018  . Asthma   . Acute respiratory failure with hypoxia ([place])   . Morbid obesity ([place]) 09/22/2018  . Anemia 06/19/2018  . Diabetic retinopathy of both eyes associated with type 2 diabetes mellitus ([place]) 04/11/2018  . Essential hypertension 03/07/2015    Social History   Tobacco Use  . Smoking status:  Never Smoker  . Smokeless tobacco: Never Used  Substance Use Topics  . Alcohol use: Yes    Alcohol/week: 0.0 standard drinks    Comment: occ     Current Outpatient Medications:  .  albuterol (PROVENTIL HFA;VENTOLIN HFA) 108 (90 Base) MCG/ACT inhaler, Inhale 2 puffs into the lungs every 4 (four) hours as needed for wheezing or shortness of breath., Disp: 1 Inhaler, Rfl: 1 .  amLODipine (NORVASC) 10 MG tablet, TAKE 1 TABLET BY MOUTH ONCE DAILY, Disp: 90 tablet, Rfl: 0 .  atorvastatin (LIPITOR) 80 MG tablet, TAKE 1 TABLET BY MOUTH  DAILY FOR CHOLESTEROL, Disp: 90 tablet, Rfl: 1 .  carvedilol (COREG) 6.25 MG tablet, TAKE 1 TABLET BY MOUTH  TWICE DAILY WITH A MEAL  (REPLACES METOPROLOL), Disp: 180 tablet, Rfl: 1 .  cyclobenzaprine (FLEXERIL) 5 MG tablet, Take 1 tablet (5 mg total) by mouth 3 (three) times daily as needed for muscle spasms., Disp: 30 tablet, Rfl: 1 .  ezetimibe (ZETIA) 10 MG tablet, Take 1 tablet (10 mg total) by mouth daily., Disp: 90 tablet, Rfl: 3 .  ferrous sulfate 325 (65 FE) MG tablet, Take 325 mg by mouth daily with breakfast., Disp: , Rfl:  .  gabapentin (NEURONTIN) 300 MG capsule, TAKE 1 CAPSULE BY MOUTH IN  THE MORNING AND 1 CAPSULE  BY MOUTH IN THE AFTERNOON  TAKE 2 CAPSULES BY MOUTH  BEFORE BEDTIME, Disp: 360 capsule, Rfl: 3 .  hydrALAZINE (APRESOLINE) 100 MG tablet, TAKE 1 TABLET BY MOUTH 3  TIMES DAILY,  Disp: 270 tablet, Rfl: 2 .  LANTUS SOLOSTAR 100 UNIT/ML Solostar Pen, INJECT 50 UNITS INTO THE SKIN DAILY., Disp: 15 mL, Rfl: 3 .  losartan-hydrochlorothiazide (HYZAAR) 50-12.5 MG tablet, TAKE 1 TABLET BY MOUTH  DAILY, Disp: 90 tablet, Rfl: 3 .  Multiple Vitamin (MULTIVITAMIN) tablet, Take 1 tablet by mouth daily., Disp: , Rfl:  .  OZEMPIC, 1 MG/DOSE, 2 MG/1.5ML SOPN, INJECT 1 MG INTO THE SKIN ONCE A WEEK., Disp: 3 pen, Rfl: 0 .  valACYclovir (VALTREX) 1000 MG tablet, Take 1 tablet (1,000 mg total) by mouth 3 (three) times daily for 10 days., Disp: 30 tablet, Rfl: 0 .   venlafaxine XR (EFFEXOR-XR) 37.5 MG 24 hr capsule, TAKE 1 CAPSULE BY MOUTH  DAILY WITH BREAKFAST, Disp: 90 capsule, Rfl: 3 .  ACCU-CHEK SOFTCLIX LANCETS lancets, Check sugars TID DX:E11.42 LON:99, Disp: 100 each, Rfl: 12 .  glucose blood (ACCU-CHEK AVIVA PLUS) test strip, Check sugars tid DX:E11.42 LON:99, Disp: 100 each, Rfl: 12 .  Insulin Pen Needle (NOVOFINE) 32G X 6 MM MISC, 1 each by Does not apply route daily., Disp: 100 each, Rfl: 3  No Known Allergies  I personally reviewed active problem list, medication list, allergies, family history, social history, health maintenance, notes from last encounter, lab results, imaging with the patient/caregiver today.   Review of Systems  10 Systems reviewed and are negative for acute change except as noted in the HPI.   Objective:   Virtual encounter, vitals limited, only able to obtain the following Today's Vitals   03/03/20 1428  Weight: 203 lb (92.1 kg)  Height: 5\' 4"  (1.626 m)  PainSc: 8    Body mass index is 34.84 kg/m. Nursing Note and Vital Signs reviewed.  Physical Exam Vitals and nursing note reviewed.  Constitutional:      General: She is not in acute distress.    Appearance: She is obese. She is ill-appearing. She is not toxic-appearing or diaphoretic.     Comments: Appears very uncomfortable, barely able to move around her bedroom, SOB and grimacing with movement  Neurological:     Mental Status: She is alert.     PE limited by telephone encounter  No results found for this or any previous visit (from the past 72 hour(s)).  Assessment and Plan:     ICD-10-CM   1. Post herpetic neuralgia  B02.29 lidocaine (XYLOCAINE) 2 % jelly  2. Hot flashes due to menopause  N95.1 venlafaxine XR (EFFEXOR-XR) 37.5 MG 24 hr capsule    Severe nerve pain, pt states she can't walk far or bend, she is observed barely being able to go up the stairs in her home or reach for a bottle of her medications to look at her pills and tell me  how much medicine she has left  Her rash is appearing improved and is very small area of rash that appears crusted over and healing Pain is to her upper torso arm chest and made much worse with any movement with her arm with bending or walking  She reports that she has very strenuous work of both of her 2 jobs and she could barely walk around her home or go out to the driveway will extend her FMLA and leave for another week  Plan is to continue to increase gabapentin to maximum renal dosing for her renal function she is currently taking 600 mg 3 times daily she is only able to increase this up to 900 mg 3 times daily as a maximum  dosing for her GFR  She will double up on her venlafaxine  Try topical lidocaine if she is able to get  Possibly refer to neurology or pain management, I have consulted with the physicians at this practice for other tx option, researched UTD and there are not a lot of other tx options -few other drugs may be added but they are definitely more neurology specialist medications today what is referred to them  -Red flags and when to present for emergency care or RTC including fever >101.71F, chest pain, shortness of breath, new/worsening/un-resolving symptoms, reviewed with patient at time of visit. Follow up and care instructions discussed and provided in AVS. - I discussed the assessment and treatment plan with the patient. The patient was provided an opportunity to ask questions and all were answered. The patient agreed with the plan and demonstrated an understanding of the instructions.  I provided 20+ minutes of non-face-to-face time during this encounter.  Delsa Grana, PA-C 03/03/20 2:52 PM

## 2020-03-04 ENCOUNTER — Other Ambulatory Visit: Payer: Self-pay

## 2020-03-04 ENCOUNTER — Encounter: Payer: Self-pay | Admitting: Family Medicine

## 2020-03-11 ENCOUNTER — Encounter: Payer: Self-pay | Admitting: Family Medicine

## 2020-03-11 ENCOUNTER — Ambulatory Visit (INDEPENDENT_AMBULATORY_CARE_PROVIDER_SITE_OTHER): Payer: 59 | Admitting: Family Medicine

## 2020-03-11 ENCOUNTER — Telehealth: Payer: Self-pay | Admitting: Family Medicine

## 2020-03-11 ENCOUNTER — Other Ambulatory Visit: Payer: Self-pay

## 2020-03-11 VITALS — BP 118/74 | HR 100 | Temp 97.8°F | Resp 18 | Ht 64.0 in | Wt 205.0 lb

## 2020-03-11 DIAGNOSIS — B0229 Other postherpetic nervous system involvement: Secondary | ICD-10-CM

## 2020-03-11 MED ORDER — GABAPENTIN 100 MG PO CAPS: 100.0000 mg | ORAL_CAPSULE | Freq: Two times a day (BID) | ORAL | 0 refills | Status: DC

## 2020-03-11 MED ORDER — HYDROCODONE-ACETAMINOPHEN 5-325 MG PO TABS: 1.0000 | ORAL_TABLET | Freq: Three times a day (TID) | ORAL | 0 refills | Status: AC

## 2020-03-11 MED ORDER — LIDOCAINE 5 % EX OINT: 1.0000 "application " | TOPICAL_OINTMENT | Freq: Four times a day (QID) | CUTANEOUS | 2 refills | Status: DC | PRN

## 2020-03-11 NOTE — Progress Notes (Signed)
Patient ID: Naliya Gish, female    DOB: October 17, 1971, 49 y.o.   MRN: [id number]  PCP: Hubbard Hartshorn, FNP  Chief Complaint  Patient presents with  . Diabetes    follow up  . Hyperlipidemia  . Hypertension    Subjective:   Marthella Osorno is a 49 y.o. female, presents to clinic with CC of the following:  HPI   post herpetic neuralgia -patient continues to be in significant amount of pain.  She does want to resume working but is able to work remotely from home. With her current kidney function we did attempt to increase her gabapentin which she is currently on for diabetic peripheral neuropathy, to higher dose/next dose per renal function but when patient increase the medicine she did not fill very good.  She does still have a few pills from her initial narcotic prescription remaining.  She notes that it helps a little bit with her pain, but also has significant side effects including GI side effects so she has tried to take very sparingly. She continues to have pain that wraps around her right side to her back and flank area is very hypersensitive still on the skin and she continues to be very fatigued with minimal movement or ambulation. Her rash never worsened she will have diffuse raised red dots that were present with her initial presentation, never spread.  Reviewed her work notes and documentation today    Patient Active Problem List   Diagnosis Date Noted  . Herpes simplex vulvovaginitis 11/10/2019  . Lumbar radiculopathy 11/10/2019  . Type 2 diabetes mellitus with hyperglycemia, with long-term current use of insulin ([place]) 09/22/2019  . Type 2 diabetes mellitus with stage 3b chronic kidney disease, with long-term current use of insulin ([place]) 09/22/2019  . Hyperlipidemia associated with type 2 diabetes mellitus ([place]) 08/10/2019  . Stage 3b chronic kidney disease 08/10/2019  . Hemoptysis 11/15/2018  . Asthma   . Acute respiratory failure with hypoxia ([employer])   . Morbid  obesity ([place]) 09/22/2018  . Anemia 06/19/2018  . Diabetic retinopathy of both eyes associated with type 2 diabetes mellitus ([place]) 04/11/2018  . Essential hypertension 03/07/2015      Current Outpatient Medications:  .  ACCU-CHEK SOFTCLIX LANCETS lancets, Check sugars TID DX:E11.42 LON:99, Disp: 100 each, Rfl: 12 .  albuterol (PROVENTIL HFA;VENTOLIN HFA) 108 (90 Base) MCG/ACT inhaler, Inhale 2 puffs into the lungs every 4 (four) hours as needed for wheezing or shortness of breath., Disp: 1 Inhaler, Rfl: 1 .  amLODipine (NORVASC) 10 MG tablet, TAKE 1 TABLET BY MOUTH ONCE DAILY, Disp: 90 tablet, Rfl: 0 .  atorvastatin (LIPITOR) 80 MG tablet, TAKE 1 TABLET BY MOUTH  DAILY FOR CHOLESTEROL, Disp: 90 tablet, Rfl: 1 .  carvedilol (COREG) 6.25 MG tablet, TAKE 1 TABLET BY MOUTH  TWICE DAILY WITH A MEAL  (REPLACES METOPROLOL), Disp: 180 tablet, Rfl: 1 .  cyclobenzaprine (FLEXERIL) 5 MG tablet, Take 1 tablet (5 mg total) by mouth 3 (three) times daily as needed for muscle spasms., Disp: 30 tablet, Rfl: 1 .  ezetimibe (ZETIA) 10 MG tablet, Take 1 tablet (10 mg total) by mouth daily., Disp: 90 tablet, Rfl: 3 .  ferrous sulfate 325 (65 FE) MG tablet, Take 325 mg by mouth daily with breakfast., Disp: , Rfl:  .  gabapentin (NEURONTIN) 300 MG capsule, TAKE 1 CAPSULE BY MOUTH IN  THE MORNING AND 1 CAPSULE  BY MOUTH IN THE AFTERNOON  TAKE 2 CAPSULES BY MOUTH  BEFORE  BEDTIME, Disp: 360 capsule, Rfl: 3 .  glucose blood (ACCU-CHEK AVIVA PLUS) test strip, Check sugars tid DX:E11.42 LON:99, Disp: 100 each, Rfl: 12 .  hydrALAZINE (APRESOLINE) 100 MG tablet, TAKE 1 TABLET BY MOUTH 3  TIMES DAILY, Disp: 270 tablet, Rfl: 2 .  Insulin Pen Needle (NOVOFINE) 32G X 6 MM MISC, 1 each by Does not apply route daily., Disp: 100 each, Rfl: 3 .  LANTUS SOLOSTAR 100 UNIT/ML Solostar Pen, INJECT 50 UNITS INTO THE SKIN DAILY., Disp: 15 mL, Rfl: 3 .  lidocaine (XYLOCAINE) 2 % jelly, Apply 1 application topically 3 (three) times daily as  needed. To affected areas, Disp: 40 mL, Rfl: 1 .  losartan-hydrochlorothiazide (HYZAAR) 50-12.5 MG tablet, TAKE 1 TABLET BY MOUTH  DAILY, Disp: 90 tablet, Rfl: 3 .  Multiple Vitamin (MULTIVITAMIN) tablet, Take 1 tablet by mouth daily., Disp: , Rfl:  .  OZEMPIC, 1 MG/DOSE, 2 MG/1.5ML SOPN, INJECT 1 MG INTO THE SKIN ONCE A WEEK., Disp: 3 pen, Rfl: 0 .  venlafaxine XR (EFFEXOR-XR) 37.5 MG 24 hr capsule, Take 2 capsules (75 mg total) by mouth daily with breakfast., Disp: 60 capsule, Rfl: 0   No Known Allergies   Family History  Problem Relation Age of Onset  . Diabetes Father   . Hyperlipidemia Father   . Hypertension Father   . Cancer Maternal Aunt 62       leukemia - died  . Diabetes Maternal Grandmother   . Cancer Maternal Grandmother        breast cancer  . Stroke Maternal Grandmother   . Breast cancer Maternal Grandmother   . Diabetes Daughter        Pre-diabetes  . Diabetes Maternal Grandfather   . Hyperlipidemia Maternal Grandfather   . Hypertension Maternal Grandfather   . Cancer Maternal Aunt 50       breast cancer - died  . Ovarian cancer Neg Hx   . Colon cancer Neg Hx      Social History   Socioeconomic History  . Marital status: Single    Spouse name: Not on file  . Number of children: 2  . Years of education: 41  . Highest education level: Not on file  Occupational History  . Occupation: Social worker at [employer]: [employer]  Tobacco Use  . Smoking status: Never Smoker  . Smokeless tobacco: Never Used  Substance and Sexual Activity  . Alcohol use: Yes    Alcohol/week: 0.0 standard drinks    Comment: occ  . Drug use: No  . Sexual activity: Not on file  Other Topics Concern  . Not on file  Social History Narrative   Atianna grew up in [place], [place] and also in [place]. She attending the [employer] and obtained her Bachelors in Psychology. She then attended [employer] and obtained her Masters in  Psychology. 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 She lives at home with her two children, Darius age 22 and Liberty age 46. They have a dog named Coco who she describes as having separation anxiety. Joeann enjoys activities with the children. They enjoy outdoor activities, watching movies.     Social Determinants of Health   Financial Resource Strain:   . Difficulty of Paying Living Expenses:   Food Insecurity:   . Worried About Charity fundraiser in the Last Year:   . Arboriculturist in the Last Year:   [employer]  Needs:   . Lack of Transportation (Medical):   Marland Kitchen Lack of Transportation (Non-Medical):   Physical Activity:   . Days of Exercise per Week:   . Minutes of Exercise per Session:   Stress:   . Feeling of Stress :   Social Connections:   . Frequency of Communication with Friends and Family:   . Frequency of Social Gatherings with Friends and Family:   . Attends Religious Services:   . Active Member of Clubs or Organizations:   . Attends Archivist Meetings:   Marland Kitchen Marital Status:   Intimate Partner Violence:   . Fear of Current or Ex-Partner:   . Emotionally Abused:   Marland Kitchen Physically Abused:   . Sexually Abused:     Chart Review Today: I personally reviewed active problem list, medication list, allergies, family history, social history, health maintenance, notes from last encounter, lab results, imaging with the patient/caregiver today.   Review of Systems 10 Systems reviewed and are negative for acute change except as noted in the HPI.     Objective:   Vitals:   03/11/20 1436  BP: 118/74  Pulse: 100  Resp: 18  Temp: 97.8 F (36.6 C)  TempSrc: Temporal  SpO2: 98%  Weight: 205 lb (93 kg)  Height: 5\' 4"  (1.626 m)    Body mass index is 35.19 kg/m.  Physical Exam Vitals and nursing note reviewed.  Constitutional:      Appearance: Normal appearance. She is obese. She is not ill-appearing, toxic-appearing or diaphoretic.      Comments: Very uncomfortable appearing woman appears nontoxic   HENT:     Head: Normocephalic and atraumatic.  Eyes:     General:        Right eye: No discharge.     Conjunctiva/sclera: Conjunctivae normal.  Cardiovascular:     Rate and Rhythm: Normal rate and regular rhythm.     Pulses: Normal pulses.     Heart sounds: Normal heart sounds. No murmur. No friction rub. No gallop.   Pulmonary:     Effort: Pulmonary effort is normal. No respiratory distress.     Breath sounds: Normal breath sounds. No stridor. No wheezing.  Abdominal:     General: Bowel sounds are normal. There is no distension.     Palpations: Abdomen is soft.     Tenderness: There is no abdominal tenderness. There is no right CVA tenderness, left CVA tenderness or guarding.  Skin:    General: Skin is warm and dry.     Capillary Refill: Capillary refill takes less than 2 seconds.     Findings: No erythema.     Comments: Fading rash -mild scattered hyperpigmented area to right side, generalized skin to the area very hypersensitive to light touch  Neurological:     Mental Status: She is alert. Mental status is at baseline.     Gait: Gait normal.  Psychiatric:        Mood and Affect: Mood normal.        Behavior: Behavior normal.      Results for orders placed or performed in visit on 02/25/20  Urine Culture   Specimen: Urine  Result Value Ref Range   MICRO NUMBER: [id number]    SPECIMEN QUALITY: Adequate    Sample Source URINE    STATUS: FINAL    Result: No Growth   POCT urinalysis dipstick  Result Value Ref Range   Color, UA lt yellow    Clarity, UA clear  Glucose, UA Negative Negative   Bilirubin, UA neg    Ketones, UA neg    Spec Grav, UA 1.020 1.010 - 1.025   Blood, UA neg    pH, UA 6.0 5.0 - 8.0   Protein, UA Positive (A) Negative   Urobilinogen, UA 0.2 0.2 or 1.0 E.U./dL   Nitrite, UA neg    Leukocytes, UA Negative Negative   Appearance clear    Odor normal         Assessment & Plan:       ICD-10-CM   1. Post herpetic neuralgia  B02.29 HYDROcodone-acetaminophen (NORCO/VICODIN) 5-325 MG tablet    gabapentin (NEURONTIN) 100 MG capsule    lidocaine (XYLOCAINE) 5 % ointment    CANCELED: Ambulatory referral to Neurology     Pain management-   lidocaine patches over the counter Rx for topical lidocaine - working with local pharmacies to find Rx that they have and can dispense  Pt encouraged to take pain meds sparingly max 1-3 pills a day  Increase gabapentin to 400 mg am and noon and take your normal 600 mg at bedtime  (sent in 100 mg to add to her 300 mg which she takes morning and lunch time)  Previously increased her effexor dose from 37.5 to 75 mg daily  Work note given today to allow her to work from home.  Discussed Dx of likely post herpetic neuralgia -she is on maximal medical therapy from what I can do from primary care we did discuss a neurology or pain management referral, do not know how long she will have nerve pain from the shingles? Encouraged her to discuss with her HR any leave her FMLA that she might need for any paperwork that we might need to redo   Hand out given regarding Dx and instructions written for pt regarding med/dose changes for pain management.  F/up as needed   Delsa Grana, PA-C 03/11/20 3:01 PM

## 2020-03-11 NOTE — Telephone Encounter (Signed)
Medication: gabapentin (NEURONTIN) 100 MG capsule  Pharmacy stated they needed clarification on instructions for dosage for patient.  Pharmacy #: [phone]

## 2020-03-11 NOTE — Telephone Encounter (Signed)
[facility] called and spoke to [facility] Pharmacist about the clarification of gabapentin. She says someone already called to clarify.

## 2020-03-11 NOTE — Patient Instructions (Addendum)
Postherpetic Neuralgia Postherpetic neuralgia (PHN) is nerve pain that occurs after a shingles infection. Shingles is a painful rash that appears on one area of the body, usually on the trunk or face. Shingles is caused by the varicella-zoster virus. This is the same virus that causes chickenpox. In people who have had chickenpox, the virus can resurface years later and cause shingles. You may have PHN if you continue to have pain for 4 months after your shingles rash has gone away. PHN appears in the same area where you had the shingles rash. The pain usually goes away after the rash disappears. Getting a vaccination for shingles can prevent PHN. This vaccine is recommended for people older than 60. It may prevent shingles, and may also lower your risk of PHN if you do get shingles. What are the causes? This condition is caused by damage to your nerves from the varicella-zoster virus. The damage makes your nerves overly sensitive. What increases the risk? The following factors may make you more likely to develop this condition:  Being older than 51 years of age.  Having severe pain before your shingles rash starts.  Having a severe rash.  Having shingles in and around the eye area.  Having a disease that makes your body unable to fight infections (weak immune system). What are the signs or symptoms? The main symptom of this condition is pain. The pain may:  Often be very bad and may be described as stabbing, burning, or feeling like an electric shock.  Come and go or may be there all the time.  Be triggered by light touches on the skin or changes in temperature. You may have itching along with the pain. How is this diagnosed? This condition may be diagnosed based on your symptoms and your history of shingles. Lab studies and other diagnostic tests are usually not needed. How is this treated? There is no cure for this condition. Treatment for PHN will focus on pain relief.  Over-the-counter pain relievers do not usually relieve PHN pain. You may need to work with a pain specialist. Treatment may include:  Antidepressant medicines to help with pain and improve sleep.  Anti-seizure medicines to relieve nerve pain.  Strong pain relievers (opioids).  A numbing patch worn on the skin (lidocaine patch).  Botox (botulinum toxin) injections to block pain signals between nerves and muscles.  Injections of numbing medicine or anti-inflammatory medicines around irritated nerves. Follow these instructions at home:   It may take a long time to recover from PHN. Work closely with your health care provider and develop a good support system at home.  Take over-the-counter and prescription medicines only as told by your health care provider.  Do not drive or use heavy machinery while taking prescription pain medicine.  Wear loose, comfortable clothing.  Cover sensitive areas with a dressing to reduce friction from clothing rubbing on the area.  If directed, put ice on the painful area: ? Put ice in a plastic bag. ? Place a towel between your skin and the bag. ? Leave the ice on for 20 minutes, 2-3 times a day.  Talk to your health care provider if you feel depressed or desperate. Living with long-term pain can be depressing.  Keep all follow-up visits as told by your health care provider. This is important. Contact a health care provider if:  Your medicine is not helping.  You are struggling to manage your pain at home. Summary  Postherpetic neuralgia is a very painful disorder   that can occur after an episode of shingles.  The pain is often severe, burning, electric, or stabbing.  Prescription medicines can be helpful in managing persistent pain.  Getting a vaccination for shingles can prevent PHN. This vaccine is recommended for people older than 60. This information is not intended to replace advice given to you by your health care provider. Make sure  you discuss any questions you have with your health care provider. Document Revised: 09/27/2017 Document Reviewed: 01/01/2017 Elsevier Patient Education  [place].   Get lidocaine patches over the counter - and we'll check on topical lidocaine prescription  Do pain meds sparingly max 1-3 pills a day  Increase gabapentin to 400 mg am and noon and take your normal 600 mg at bedtime

## 2020-03-15 ENCOUNTER — Encounter: Payer: Self-pay | Admitting: Family Medicine

## 2020-03-16 ENCOUNTER — Encounter: Payer: Self-pay | Admitting: Family Medicine

## 2020-03-18 ENCOUNTER — Other Ambulatory Visit: Payer: Self-pay

## 2020-03-18 DIAGNOSIS — E1142 Type 2 diabetes mellitus with diabetic polyneuropathy: Secondary | ICD-10-CM

## 2020-03-18 MED ORDER — OZEMPIC (1 MG/DOSE) 2 MG/1.5ML ~~LOC~~ SOPN: 1.0000 mg | PEN_INJECTOR | SUBCUTANEOUS | 3 refills | Status: DC

## 2020-03-22 ENCOUNTER — Encounter: Payer: Self-pay | Admitting: Family Medicine

## 2020-03-26 ENCOUNTER — Other Ambulatory Visit: Payer: Self-pay | Admitting: Family Medicine

## 2020-03-26 DIAGNOSIS — N951 Menopausal and female climacteric states: Secondary | ICD-10-CM

## 2020-03-28 ENCOUNTER — Encounter: Payer: Self-pay | Admitting: Family Medicine

## 2020-03-29 NOTE — Telephone Encounter (Signed)
She states is still doing 37.5mg 

## 2020-03-30 NOTE — Telephone Encounter (Signed)
She needs a letter to allow her to use a cane at work?   Her employer should not typically ask to know her medications either?  I am happy to write a return to work note with restrictions/limitations, but I'm not putting her medications on a letter to her employer.

## 2020-04-05 ENCOUNTER — Other Ambulatory Visit: Payer: Self-pay | Admitting: Family Medicine

## 2020-04-05 ENCOUNTER — Other Ambulatory Visit: Payer: Self-pay

## 2020-04-05 MED ORDER — CARVEDILOL 6.25 MG PO TABS: ORAL_TABLET | ORAL | 3 refills | Status: DC

## 2020-04-08 ENCOUNTER — Other Ambulatory Visit: Payer: Self-pay

## 2020-04-08 MED ORDER — EZETIMIBE 10 MG PO TABS: 10.0000 mg | ORAL_TABLET | Freq: Every day | ORAL | 3 refills | Status: DC

## 2020-04-15 ENCOUNTER — Other Ambulatory Visit: Payer: Self-pay | Admitting: Emergency Medicine

## 2020-04-15 MED ORDER — CARVEDILOL 6.25 MG PO TABS: 6.2500 mg | ORAL_TABLET | Freq: Two times a day (BID) | ORAL | 3 refills | Status: DC

## 2020-05-26 ENCOUNTER — Other Ambulatory Visit: Payer: Self-pay

## 2020-05-26 DIAGNOSIS — E1142 Type 2 diabetes mellitus with diabetic polyneuropathy: Secondary | ICD-10-CM

## 2020-05-26 MED ORDER — LANTUS SOLOSTAR 100 UNIT/ML ~~LOC~~ SOPN: 50.0000 [IU] | PEN_INJECTOR | Freq: Every day | SUBCUTANEOUS | 3 refills | Status: DC

## 2020-07-07 ENCOUNTER — Encounter: Payer: Self-pay | Admitting: Family Medicine

## 2020-07-07 ENCOUNTER — Ambulatory Visit: Payer: 59 | Admitting: Family Medicine

## 2020-07-07 ENCOUNTER — Other Ambulatory Visit: Payer: Self-pay

## 2020-07-07 VITALS — BP 136/84 | HR 84 | Temp 98.1°F | Resp 16 | Ht 64.0 in | Wt 201.7 lb

## 2020-07-07 DIAGNOSIS — Z1231 Encounter for screening mammogram for malignant neoplasm of breast: Secondary | ICD-10-CM | POA: Diagnosis not present

## 2020-07-07 DIAGNOSIS — B0229 Other postherpetic nervous system involvement: Secondary | ICD-10-CM

## 2020-07-07 DIAGNOSIS — N1832 Chronic kidney disease, stage 3b: Secondary | ICD-10-CM

## 2020-07-07 DIAGNOSIS — Z794 Long term (current) use of insulin: Secondary | ICD-10-CM

## 2020-07-07 DIAGNOSIS — N951 Menopausal and female climacteric states: Secondary | ICD-10-CM

## 2020-07-07 DIAGNOSIS — E1165 Type 2 diabetes mellitus with hyperglycemia: Secondary | ICD-10-CM

## 2020-07-07 DIAGNOSIS — Z23 Encounter for immunization: Secondary | ICD-10-CM | POA: Diagnosis not present

## 2020-07-07 DIAGNOSIS — M25512 Pain in left shoulder: Secondary | ICD-10-CM | POA: Diagnosis not present

## 2020-07-07 DIAGNOSIS — E669 Obesity, unspecified: Secondary | ICD-10-CM

## 2020-07-07 DIAGNOSIS — E1169 Type 2 diabetes mellitus with other specified complication: Secondary | ICD-10-CM

## 2020-07-07 DIAGNOSIS — I10 Essential (primary) hypertension: Secondary | ICD-10-CM

## 2020-07-07 DIAGNOSIS — IMO0002 Reserved for concepts with insufficient information to code with codable children: Secondary | ICD-10-CM

## 2020-07-07 DIAGNOSIS — Z6834 Body mass index (BMI) 34.0-34.9, adult: Secondary | ICD-10-CM

## 2020-07-07 DIAGNOSIS — E785 Hyperlipidemia, unspecified: Secondary | ICD-10-CM

## 2020-07-07 MED ORDER — LOSARTAN POTASSIUM-HCTZ 50-12.5 MG PO TABS: 1.0000 | ORAL_TABLET | Freq: Every day | ORAL | 3 refills | Status: DC

## 2020-07-07 MED ORDER — BLOOD GLUCOSE MONITOR KIT: PACK | 0 refills | Status: DC

## 2020-07-07 MED ORDER — VENLAFAXINE HCL ER 75 MG PO CP24: 75.0000 mg | ORAL_CAPSULE | Freq: Every day | ORAL | 3 refills | Status: DC

## 2020-07-07 MED ORDER — AMLODIPINE BESYLATE 10 MG PO TABS: 10.0000 mg | ORAL_TABLET | Freq: Every day | ORAL | 3 refills | Status: DC

## 2020-07-07 NOTE — Progress Notes (Signed)
Patient ID: Janice Jennings, female    DOB: Nov 21, 1970, 49 y.o.   MRN: [id number]  PCP: Delsa Grana, PA-C  Chief Complaint  Patient presents with  . Consult    need note for work do to having shingles    Subjective:   Janice Jennings is a 49 y.o. female, presents to clinic with CC of the following: discuss restrictions for work due to nerve complications from Shingles. No new symptoms, no pain.    Pt presents requesting letter to clear her to have no more work restrictions.  In April and May of this year she had shingles with severe neuropathic pain, it was severly impacting her ability to move and function, she was seen and treated here multiple times and then consulted with [facility] neurology.  Her PHN pain was tx with slight dose increase of gabapentin and topical meds.  She has since improved and is no longer needing physical work limitation.  She is not using a cane, has no limited mobility or function right now secondary to PHN and she requests letter removing restrictions for work.  She does note left shoulder pain and decreased ROM since March - while in severe pain she did not notice it as much, but for the past couple months left shoulder pain is more noticeable, she cannot lift her arm above shoulder level, its constant pain, worse with palpation or certain movements, radiates to upper left trap and left posterior neck muscles.  She denies redness, swelling, numbness or weakness in left arm.  She is RHD.  No injury or past evaluation.  She is not sure if she did anything or when is started exactly - but she did notice sx shortly after COVID vaccinations and prior to shingles  She has labs done with nephrology about 3 weeks ago. CKD stage 3a is stable with GFR of 44, sCr 1.56 A1C was high which she expected with decreased physical activity and mobility over the past 6 months or so - A1C 8.5' She is on insulin glargin 50 units once daily and ozempic - sugars have been high  200's  Hypertension:  Currently managed on hydralazine, amlodipine, losartan-HCTZ - she reviews her meds on a pharmacy app on her phone and dose not believe she has all those meds right now - not all on list -  She has seen multiple providers over the past 1-2 years - Lada, Emily NP and Benjamine Mola NP and myself (only since April for shingles)  Pt reports good med compliance and denies any SE.   Blood pressure today is borderline well controlled -  BP was higher when she was here in severe pain, and lower with last OV  BP Readings from Last 3 Encounters:  07/07/20 136/84  03/11/20 118/74  02/25/20 (!) 160/84   Pt denies CP, SOB, exertional sx, LE edema, palpitation, Ha's, visual disturbances, lightheadedness, hypotension, syncope.  DM:   Pt managing DM with ozempic 1 mg weekly and lantus 50 units daily - she lost here meter but knows its been running high - she saw endocrinology in the past but states "they said there was nothing they could do for me" so does not appear she is continuing any management with endocrinology- last OV with PCP was Jan for DM Reports good med compliance Pt has no SE from meds. She has neuropathy managed with gabapentin 300 mg - 300 mg in am and midday, 600 mg at bedtime Denies: Polyuria, polydipsia, vision changes, hypoglycemia Recent pertinent labs:  Lab Results  Component Value Date   HGBA1C 7.2 (H) 11/11/2019   HGBA1C >14.0 (H) 08/10/2019   HGBA1C 9.5 (H) 04/10/2019   06/13/2020 A1C 8.5  Hyperlipidemia: Currently treated with zetia and lipitor, pt reports good med compliance Lipid panel done with nephrology - similar to our last lipid panel LDL well controlled HDL low Last Lipids: Lab Results  Component Value Date   CHOL 126 08/10/2019   HDL 40 (L) 08/10/2019   LDLCALC 66 08/10/2019   TRIG 123 08/10/2019   CHOLHDL 3.2 08/10/2019   - Denies: Chest pain, shortness of breath, myalgias, claudication   Health Maintenance  Topic Date Due  . FOOT EXAM   12/03/2019  . HEMOGLOBIN A1C  05/10/2020  . INFLUENZA VACCINE  05/29/2020  . MAMMOGRAM  08/24/2020  . OPHTHALMOLOGY EXAM  11/01/2020  . PAP SMEAR-Modifier  03/04/2021  . TETANUS/TDAP  05/26/2024  . COLONOSCOPY  08/15/2028  . PNEUMOCOCCAL POLYSACCHARIDE VACCINE AGE 55-64 HIGH RISK  Completed  . COVID-19 Vaccine  Completed  . Hepatitis C Screening  Completed  . HIV Screening  Completed   Due for foot exam, flu, mammogram in the next couple months      Patient Active Problem List   Diagnosis Date Noted  . Herpes simplex vulvovaginitis 11/10/2019  . Lumbar radiculopathy 11/10/2019  . Type 2 diabetes mellitus with hyperglycemia, with long-term current use of insulin ([place]) 09/22/2019  . Type 2 diabetes mellitus with stage 3b chronic kidney disease, with long-term current use of insulin ([place]) 09/22/2019  . Hyperlipidemia associated with type 2 diabetes mellitus ([place]) 08/10/2019  . Stage 3b chronic kidney disease 08/10/2019  . Hemoptysis 11/15/2018  . Asthma   . Acute respiratory failure with hypoxia ([place])   . Morbid obesity ([place]) 09/22/2018  . Anemia 06/19/2018  . Diabetic retinopathy of both eyes associated with type 2 diabetes mellitus ([place]) 04/11/2018  . Essential hypertension 03/07/2015      Current Outpatient Medications:  .  ACCU-CHEK SOFTCLIX LANCETS lancets, Check sugars TID DX:E11.42 LON:99, Disp: 100 each, Rfl: 12 .  albuterol (PROVENTIL HFA;VENTOLIN HFA) 108 (90 Base) MCG/ACT inhaler, Inhale 2 puffs into the lungs every 4 (four) hours as needed for wheezing or shortness of breath., Disp: 1 Inhaler, Rfl: 1 .  amLODipine (NORVASC) 10 MG tablet, TAKE 1 TABLET BY MOUTH ONCE DAILY, Disp: 90 tablet, Rfl: 0 .  atorvastatin (LIPITOR) 80 MG tablet, TAKE 1 TABLET BY MOUTH ONCE DAILY FOR CHOLESTEROL, Disp: 90 tablet, Rfl: 1 .  carvedilol (COREG) 6.25 MG tablet, Take 1 tablet (6.25 mg total) by mouth 2 (two) times daily with a meal., Disp: 180 tablet, Rfl: 3 .  cyclobenzaprine  (FLEXERIL) 5 MG tablet, Take 1 tablet (5 mg total) by mouth 3 (three) times daily as needed for muscle spasms., Disp: 30 tablet, Rfl: 1 .  ezetimibe (ZETIA) 10 MG tablet, Take 1 tablet (10 mg total) by mouth daily., Disp: 90 tablet, Rfl: 3 .  ferrous sulfate 325 (65 FE) MG tablet, Take 325 mg by mouth daily with breakfast., Disp: , Rfl:  .  gabapentin (NEURONTIN) 100 MG capsule, Take 1 capsule (100 mg total) by mouth 2 (two) times daily. Add 100 mg po to morning and lunch 300 mg dose for total of 400 mg in am and lunch for pain, Disp: 180 capsule, Rfl: 0 .  gabapentin (NEURONTIN) 300 MG capsule, TAKE 1 CAPSULE BY MOUTH IN  THE MORNING AND 1 CAPSULE  BY MOUTH IN THE AFTERNOON  TAKE 2  CAPSULES BY MOUTH  BEFORE BEDTIME, Disp: 360 capsule, Rfl: 3 .  glucose blood (ACCU-CHEK AVIVA PLUS) test strip, Check sugars tid DX:E11.42 LON:99, Disp: 100 each, Rfl: 12 .  hydrALAZINE (APRESOLINE) 100 MG tablet, TAKE 1 TABLET BY MOUTH 3  TIMES DAILY, Disp: 270 tablet, Rfl: 2 .  insulin glargine (LANTUS SOLOSTAR) 100 UNIT/ML Solostar Pen, Inject 50 Units into the skin daily., Disp: 15 mL, Rfl: 3 .  Insulin Pen Needle (NOVOFINE) 32G X 6 MM MISC, 1 each by Does not apply route daily., Disp: 100 each, Rfl: 3 .  losartan-hydrochlorothiazide (HYZAAR) 50-12.5 MG tablet, TAKE 1 TABLET BY MOUTH  DAILY, Disp: 90 tablet, Rfl: 3 .  Multiple Vitamin (MULTIVITAMIN) tablet, Take 1 tablet by mouth daily., Disp: , Rfl:  .  Semaglutide, 1 MG/DOSE, (OZEMPIC, 1 MG/DOSE,) 2 MG/1.5ML SOPN, Inject 1 mg into the skin once a week., Disp: 3 pen, Rfl: 3 .  venlafaxine XR (EFFEXOR-XR) 37.5 MG 24 hr capsule, Take 1 capsule (37.5 mg total) by mouth daily with breakfast., Disp: 90 capsule, Rfl: 3 .  lidocaine (XYLOCAINE) 2 % jelly, Apply 1 application topically 3 (three) times daily as needed. To affected areas (Patient not taking: Reported on 07/07/2020), Disp: 40 mL, Rfl: 1 .  lidocaine (XYLOCAINE) 5 % ointment, Apply 1 application topically 4 (four)  times daily as needed. (Patient not taking: Reported on 07/07/2020), Disp: 50 g, Rfl: 2   No Known Allergies   Social History   Tobacco Use  . Smoking status: Never Smoker  . Smokeless tobacco: Never Used  Vaping Use  . Vaping Use: Never used  Substance Use Topics  . Alcohol use: Yes    Alcohol/week: 0.0 standard drinks    Comment: occ  . Drug use: No      Chart Review Today: I personally reviewed active problem list, medication list, allergies, family history, social history, health maintenance, notes from last encounter, lab results, imaging with the patient/caregiver today.   Review of Systems 10 Systems reviewed and are negative for acute change except as noted in the HPI.     Objective:   Vitals:   07/07/20 0846  BP: 136/84  Pulse: 84  Resp: 16  Temp: 98.1 F (36.7 C)  TempSrc: Oral  SpO2: 99%  Weight: 201 lb 11.2 oz (91.5 kg)  Height: '5\' 4"'  (1.626 m)    Body mass index is 34.62 kg/m.  Physical Exam Vitals and nursing note reviewed.  Constitutional:      General: She is not in acute distress.    Appearance: Normal appearance. She is well-developed. She is obese. She is not ill-appearing, toxic-appearing or diaphoretic.     Interventions: Face mask in place.  HENT:     Head: Normocephalic and atraumatic.     Right Ear: External ear normal.     Left Ear: External ear normal.  Eyes:     General: Lids are normal. No scleral icterus.       Right eye: No discharge.        Left eye: No discharge.     Conjunctiva/sclera: Conjunctivae normal.  Neck:     Trachea: Phonation normal. No tracheal deviation.  Cardiovascular:     Rate and Rhythm: Normal rate and regular rhythm.     Pulses: Normal pulses.          Radial pulses are 2+ on the right side and 2+ on the left side.       Posterior tibial pulses are 2+ on  the right side and 2+ on the left side.     Heart sounds: Normal heart sounds. No murmur heard.  No friction rub. No gallop.   Pulmonary:      Effort: Pulmonary effort is normal. No respiratory distress.     Breath sounds: Normal breath sounds. No stridor. No wheezing, rhonchi or rales.  Chest:     Chest wall: No tenderness.  Abdominal:     General: Bowel sounds are normal. There is no distension.     Palpations: Abdomen is soft.  Musculoskeletal:     Left shoulder: Tenderness present. No swelling or deformity. Decreased range of motion. Normal strength. Normal pulse.     Right lower leg: No edema.     Left lower leg: No edema.     Comments: Left shoulder limited flexion above shoulder level, generalized ttp to muscles and to upper left trapezius Some AC joint ttp  Skin:    General: Skin is warm and dry.     Coloration: Skin is not jaundiced or pale.     Findings: No rash.  Neurological:     Mental Status: She is alert.     Motor: No abnormal muscle tone.     Gait: Gait normal.  Psychiatric:        Mood and Affect: Mood normal.        Speech: Speech normal.        Behavior: Behavior normal.           Assessment & Plan:   Pt is a 49 year old who presents for f/up on PHN, needs letter for work to release her from restrictions, she has acute/chronic left shoulder pain and multiple chronic conditions that have not been f/up on/lost to f/up due to illness and changing providers  1. Post herpetic neuralgia Improved and recovered after several months of PHN, she consulted with neurology- they gave pt restrictions but did not change our past management for her pain, she is feeling fully recovered - letter written today to remove work restrictions  2. Encounter for screening mammogram for malignant neoplasm of breast Ordered, due in Oct - MM 3D SCREEN BREAST BILATERAL; Future  3. Need for shingles vaccine Discussed vaccine, expected SE, pt requested today, after reviewing at length with pt, she agreed to get, no immediate reaction to vaccination today.  Return in 2-6 months for 2nd shot - Varicella-zoster vaccine IM  (Shingrix)  4. Left shoulder pain, unspecified chronicity Limited ROM, diffusely tender - impingement?  Pt prefers to see ortho specialists - I expect PT will help - defer imaging to ortho after discussion with pt and per pt preference - Ambulatory referral to Orthopedics  5. Insulin dependent type 2 diabetes mellitus, uncontrolled ([place]) Uncontrolled Pt instructed to titrate insulin to morning blood sugar goal of 80-150 Instructions given - f/up if not improving in the next month or complications with getting monitor etc, otherwise pt due to return for close f/up appt on her routine dx in the next 2-3 months  Will need repeat A1C, foot exam She does not have glucometer - ordered and printed for pt - discussed at length med titration and encouraged pt to get closer f/up (in 1 month or so) if any difficulty with DM control/med dose changes or getting glucometer.  - blood glucose meter kit and supplies KIT; Dispense based on patient and insurance preference. Use up to four times daily as directed. (FOR ICD-9 250.00, 250.01).  Dispense: 1 each; Refill: 0  6. Essential hypertension Refilled and reviewed meds - pt to review meds at home and send me mychart msg if she is missing meds or needs refills, I feel her BP is a little above goal and I do suspect she may be out of some meds No sx or concerns today and renal function stable  Continue norvasc, hydralazine, carvedilol, losartan-HCTZ  F/up in 2-3 months  - amLODipine (NORVASC) 10 MG tablet; Take 1 tablet (10 mg total) by mouth daily.  Dispense: 90 tablet; Refill: 3 - losartan-hydrochlorothiazide (HYZAAR) 50-12.5 MG tablet; Take 1 tablet by mouth daily.  Dispense: 90 tablet; Refill: 3  7. Hot flashes due to menopause Worsening hot flashes noted when we were reviewing her med list today D/c 37.5 mg venlafaxine and start effexor 75 - f/up as needed - venlafaxine XR (EFFEXOR-XR) 75 MG 24 hr capsule; Take 1 capsule (75 mg total) by mouth daily  with breakfast.  Dispense: 90 capsule; Refill: 3  8. Stage 3b chronic kidney disease Per nephrology - reviewed labs and meds +PTH which seems new Proteinuria - worse Cannot see recent result note from Dr. Juleen China despite thorough chart review done today  9. Hyperlipidemia associated with type 2 diabetes mellitus ([place]) Last lipid well controlled, she is tolerating lipitor and zetia - refills given Lipid panel done by nephrology 06/13/2020 - reviewed lipids, ldl at goal , stable low HDL, Bmet done not CMET - will need LFTs checked with next OV here  10. Class 1 obesity with serious comorbidity and body mass index (BMI) of 34.0 to 34.9 in adult, unspecified obesity type With associated HLD, HTN, DM CKD stage 3   Return in about 12 weeks (around 09/29/2020) for f/up DM A1C.  Need sooner f/up if DM uncontrolled or issues  Delsa Grana, PA-C 07/07/20 9:00 AM

## 2020-07-07 NOTE — Patient Instructions (Addendum)
Nov 16th would be able to repeat labs for Diabetes   Titrate your insulin to a morning fasting blood sugar of 80-150 fasting in the morning    Adjust your insulin dose by 2-5 units as needed to get sugars to goal, and wait about 3-7 days between dose adjustments.

## 2020-07-08 ENCOUNTER — Encounter: Payer: Self-pay | Admitting: Family Medicine

## 2020-07-08 DIAGNOSIS — M179 Osteoarthritis of knee, unspecified: Secondary | ICD-10-CM | POA: Insufficient documentation

## 2020-07-08 DIAGNOSIS — M25512 Pain in left shoulder: Secondary | ICD-10-CM | POA: Insufficient documentation

## 2020-07-08 DIAGNOSIS — N951 Menopausal and female climacteric states: Secondary | ICD-10-CM | POA: Insufficient documentation

## 2020-07-12 ENCOUNTER — Ambulatory Visit: Payer: 59 | Admitting: Family Medicine

## 2020-08-04 ENCOUNTER — Encounter: Payer: Self-pay | Admitting: Family Medicine

## 2020-08-05 ENCOUNTER — Other Ambulatory Visit: Payer: Self-pay

## 2020-08-05 DIAGNOSIS — E1165 Type 2 diabetes mellitus with hyperglycemia: Secondary | ICD-10-CM

## 2020-08-05 DIAGNOSIS — IMO0002 Reserved for concepts with insufficient information to code with codable children: Secondary | ICD-10-CM

## 2020-08-05 MED ORDER — BLOOD GLUCOSE MONITOR KIT: PACK | 0 refills | Status: DC

## 2020-08-17 ENCOUNTER — Other Ambulatory Visit: Payer: Self-pay | Admitting: Family Medicine

## 2020-08-17 DIAGNOSIS — IMO0002 Reserved for concepts with insufficient information to code with codable children: Secondary | ICD-10-CM

## 2020-08-17 DIAGNOSIS — E1165 Type 2 diabetes mellitus with hyperglycemia: Secondary | ICD-10-CM

## 2020-08-18 MED ORDER — BLOOD GLUCOSE MONITOR KIT: PACK | 0 refills | Status: DC

## 2020-08-18 NOTE — Addendum Note (Signed)
Addended by: Delsa Grana on: 08/18/2020 01:16 PM   Modules accepted: Orders

## 2020-09-12 ENCOUNTER — Other Ambulatory Visit: Payer: Self-pay | Admitting: Family Medicine

## 2020-09-13 ENCOUNTER — Encounter: Payer: Self-pay | Admitting: Family Medicine

## 2020-09-14 ENCOUNTER — Other Ambulatory Visit: Payer: Self-pay

## 2020-09-14 ENCOUNTER — Ambulatory Visit
Admission: RE | Admit: 2020-09-14 | Discharge: 2020-09-14 | Disposition: A | Discharge status: Home / Self Care | Payer: 59 | Source: Ambulatory Visit | Attending: Family Medicine | Admitting: Family Medicine

## 2020-09-14 DIAGNOSIS — Z1231 Encounter for screening mammogram for malignant neoplasm of breast: Secondary | ICD-10-CM | POA: Insufficient documentation

## 2020-09-16 ENCOUNTER — Other Ambulatory Visit: Payer: Self-pay | Admitting: Family Medicine

## 2020-09-16 DIAGNOSIS — E1142 Type 2 diabetes mellitus with diabetic polyneuropathy: Secondary | ICD-10-CM

## 2020-09-28 ENCOUNTER — Ambulatory Visit (INDEPENDENT_AMBULATORY_CARE_PROVIDER_SITE_OTHER): Payer: 59

## 2020-09-28 ENCOUNTER — Other Ambulatory Visit: Payer: Self-pay

## 2020-09-28 DIAGNOSIS — Z23 Encounter for immunization: Secondary | ICD-10-CM

## 2020-09-29 ENCOUNTER — Ambulatory Visit: Payer: 59 | Admitting: Family Medicine

## 2020-09-29 ENCOUNTER — Other Ambulatory Visit: Payer: Self-pay | Admitting: Family Medicine

## 2020-09-29 NOTE — Telephone Encounter (Signed)
Pt informed that prescription has been sent to pharmacy and pt did schedule her follow up visit for 12.30

## 2020-10-18 IMAGING — US US RENAL
1 series · 14 of 25 positions shown · non-contrast
Comparison: October 14, 2018

CLINICAL DATA: Renal insufficiency

EXAM:
RENAL / URINARY TRACT ULTRASOUND COMPLETE

[Series 1: us renal · 0.25mm/px · 14 of 37 slices shown]
[im 1/37]
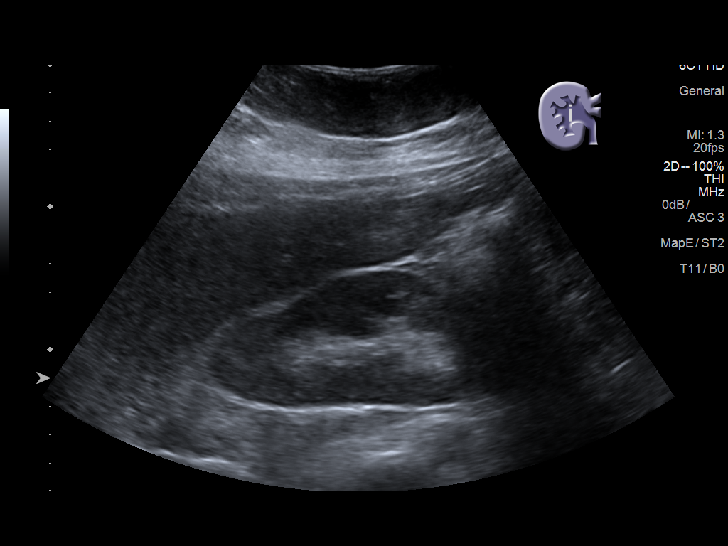
[im 4/37]
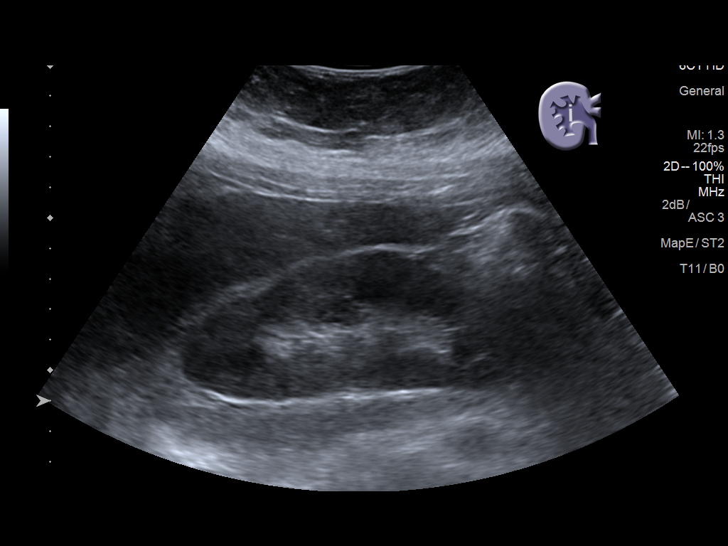
[im 7/37]
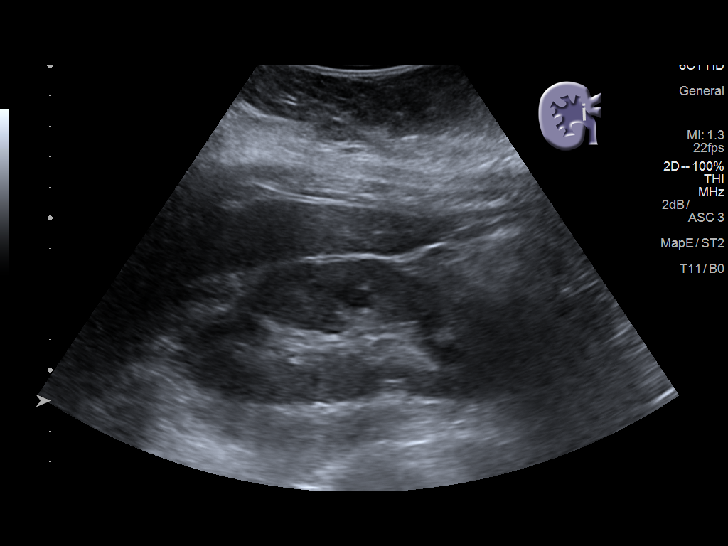
[im 10/37]
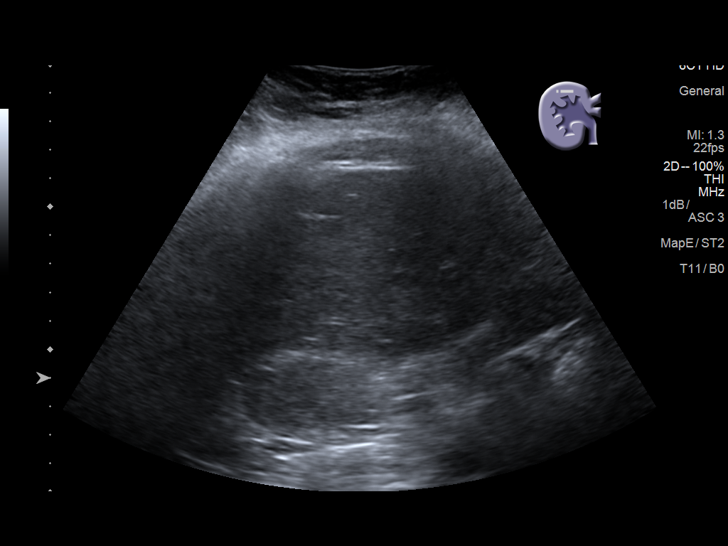
[im 13/37]
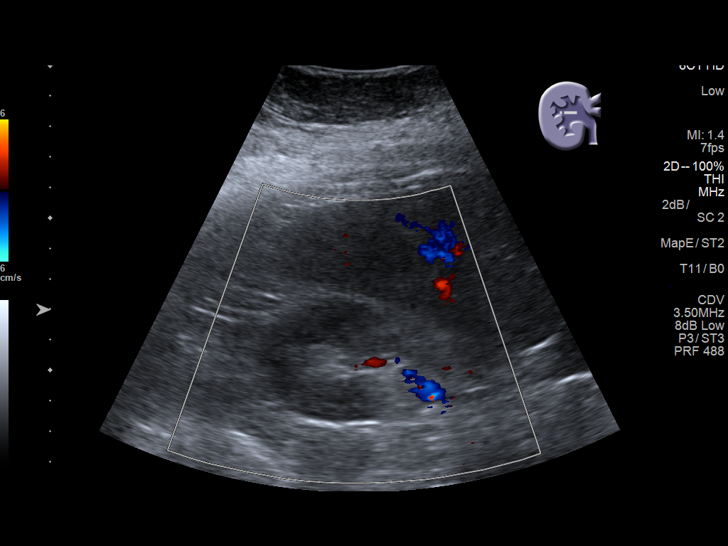
[im 14/37]
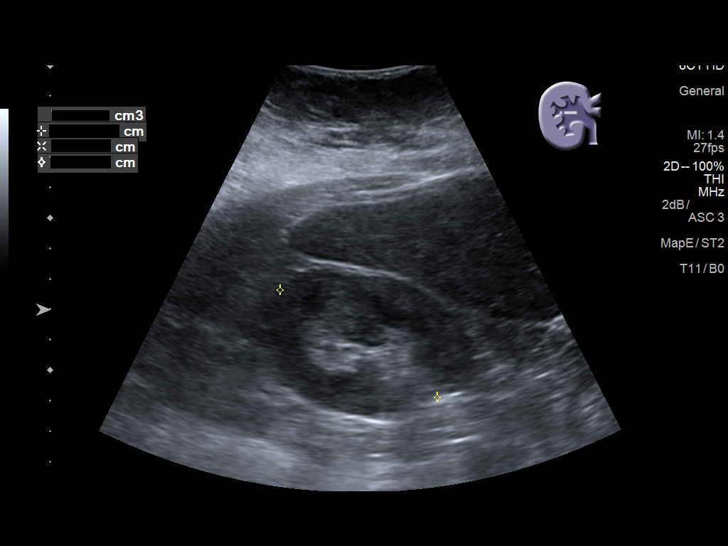
[im 17/37]
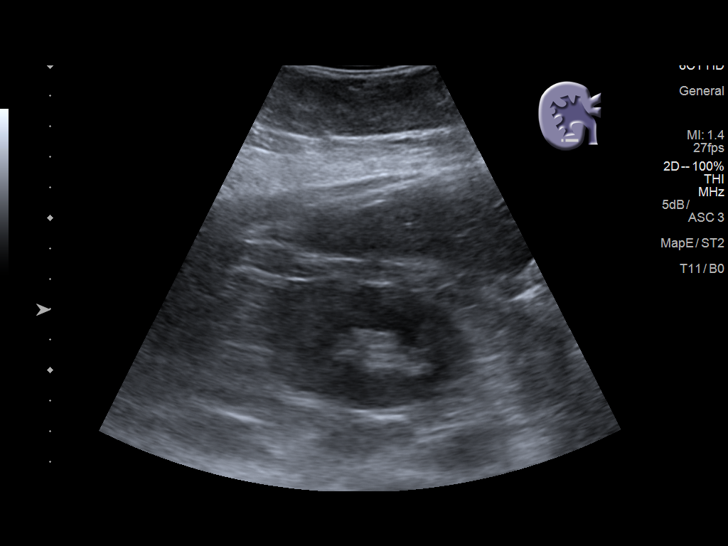
[im 20/37]
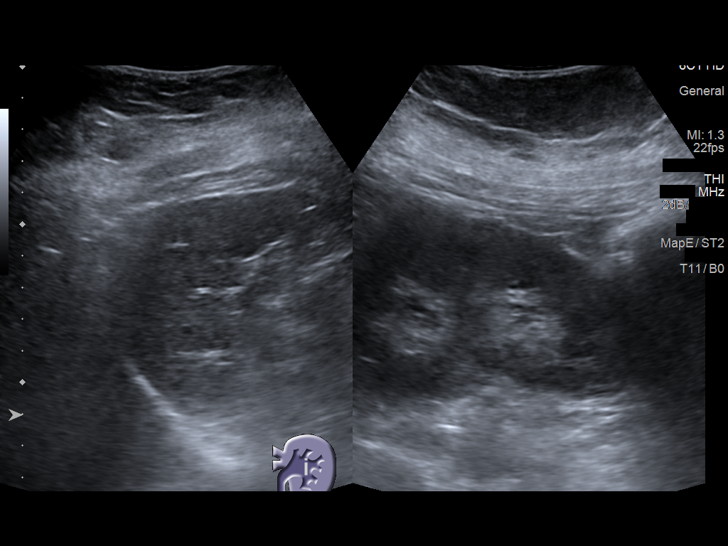
[im 23/37]
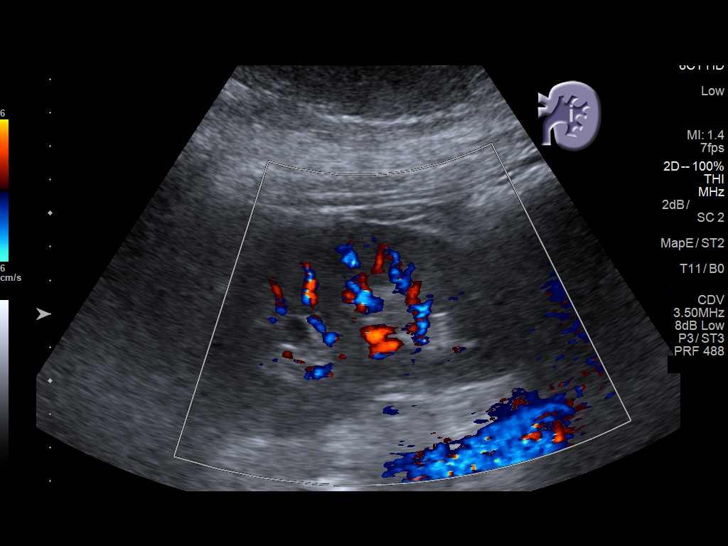
[im 25/37]
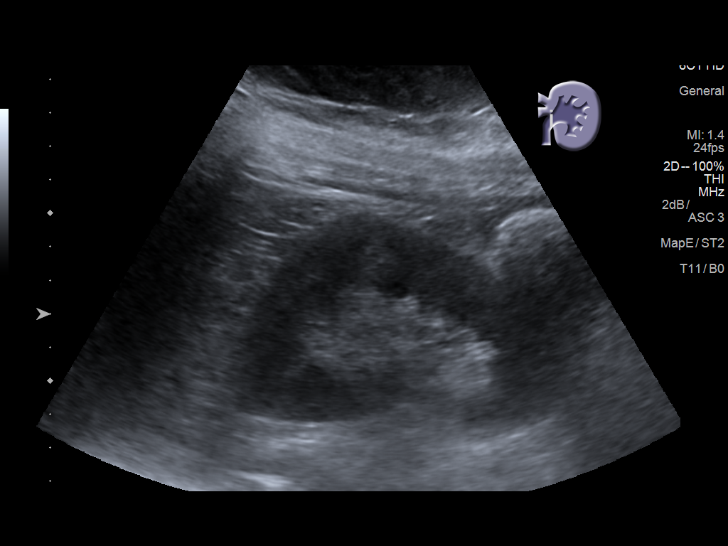
[im 28/37]
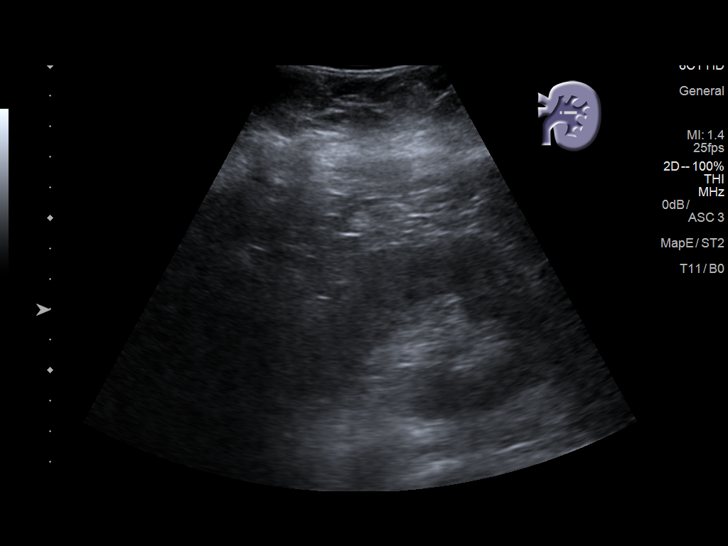
[im 31/37]
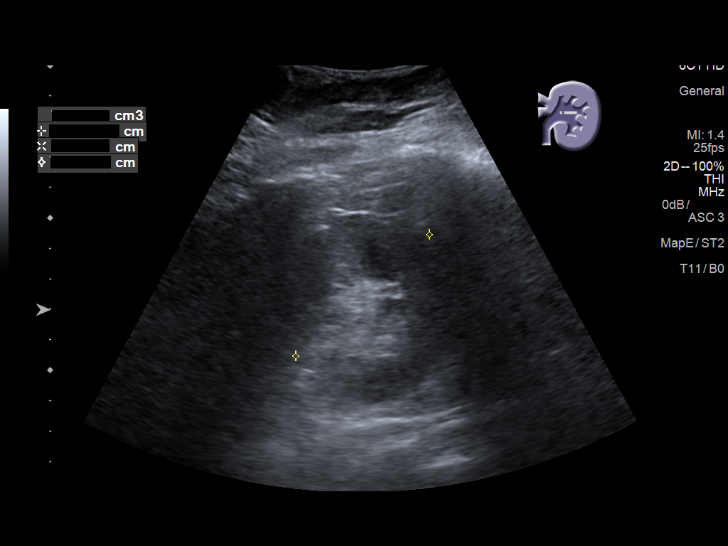
[im 34/37]
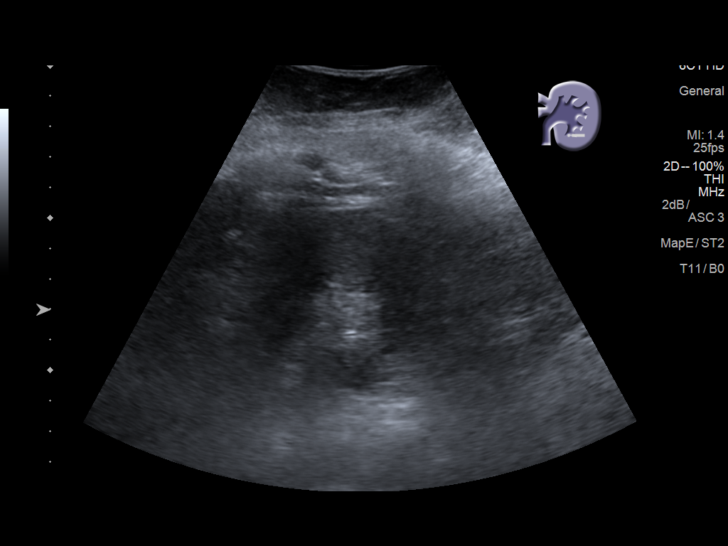
[im 37/37]
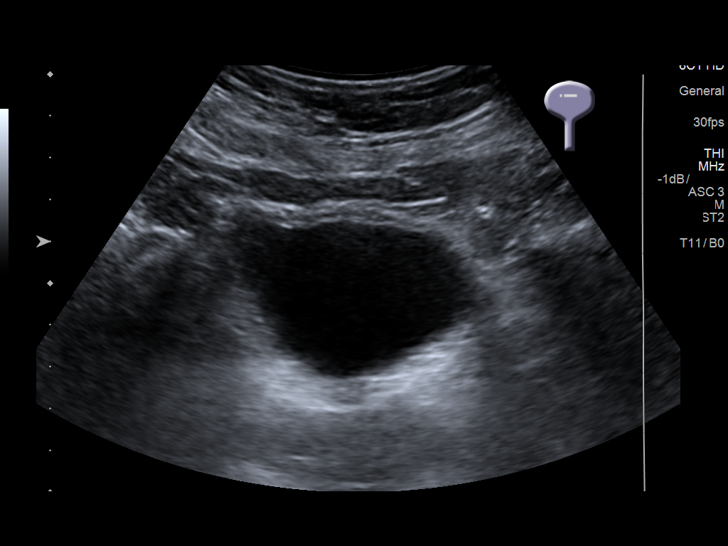

[14 of 25 positions shown; findings below may reference images not displayed]

FINDINGS: Right Kidney:

Renal measurements: 11.4 x 4.6 x 6.3 cm = volume: 171.6 mL .
Echogenicity and renal cortical thickness are within normal limits.
No mass, perinephric fluid, or hydronephrosis visualized. No
sonographically demonstrable calculus or ureterectasis.

Left Kidney:

Renal measurements: 10.4 x 5.0 x 5.9 cm = volume: 161.6 mL.
Echogenicity and renal cortical thickness are within normal limits.
No mass, perinephric fluid, or hydronephrosis visualized. No
sonographically demonstrable calculus or ureterectasis.

Bladder:

Appears normal for degree of bladder distention.
IMPRESSION: Study within normal limits.

## 2020-10-19 ENCOUNTER — Other Ambulatory Visit: Payer: Self-pay | Admitting: Family Medicine

## 2020-10-19 DIAGNOSIS — E1142 Type 2 diabetes mellitus with diabetic polyneuropathy: Secondary | ICD-10-CM

## 2020-10-27 ENCOUNTER — Ambulatory Visit
Admission: RE | Admit: 2020-10-27 | Discharge: 2020-10-27 | Disposition: A | Discharge status: Home / Self Care | Payer: 59 | Attending: Family Medicine | Admitting: Family Medicine

## 2020-10-27 ENCOUNTER — Other Ambulatory Visit: Payer: Self-pay

## 2020-10-27 ENCOUNTER — Ambulatory Visit (INDEPENDENT_AMBULATORY_CARE_PROVIDER_SITE_OTHER): Payer: 59 | Admitting: Family Medicine

## 2020-10-27 ENCOUNTER — Encounter: Payer: Self-pay | Admitting: Family Medicine

## 2020-10-27 ENCOUNTER — Ambulatory Visit
Admission: RE | Admit: 2020-10-27 | Discharge: 2020-10-27 | Disposition: A | Discharge status: Home / Self Care | Payer: 59 | Source: Ambulatory Visit | Attending: Family Medicine | Admitting: Family Medicine

## 2020-10-27 VITALS — BP 128/84 | HR 94 | Temp 98.3°F | Resp 16 | Ht 64.0 in | Wt 203.2 lb

## 2020-10-27 DIAGNOSIS — J4520 Mild intermittent asthma, uncomplicated: Secondary | ICD-10-CM

## 2020-10-27 DIAGNOSIS — D631 Anemia in chronic kidney disease: Secondary | ICD-10-CM

## 2020-10-27 DIAGNOSIS — N1832 Chronic kidney disease, stage 3b: Secondary | ICD-10-CM

## 2020-10-27 DIAGNOSIS — E669 Obesity, unspecified: Secondary | ICD-10-CM

## 2020-10-27 DIAGNOSIS — Z794 Long term (current) use of insulin: Secondary | ICD-10-CM | POA: Diagnosis not present

## 2020-10-27 DIAGNOSIS — N2581 Secondary hyperparathyroidism of renal origin: Secondary | ICD-10-CM | POA: Insufficient documentation

## 2020-10-27 DIAGNOSIS — R079 Chest pain, unspecified: Secondary | ICD-10-CM | POA: Insufficient documentation

## 2020-10-27 DIAGNOSIS — Z760 Encounter for issue of repeat prescription: Secondary | ICD-10-CM

## 2020-10-27 DIAGNOSIS — Z6834 Body mass index (BMI) 34.0-34.9, adult: Secondary | ICD-10-CM

## 2020-10-27 DIAGNOSIS — E1169 Type 2 diabetes mellitus with other specified complication: Secondary | ICD-10-CM | POA: Diagnosis not present

## 2020-10-27 DIAGNOSIS — E1165 Type 2 diabetes mellitus with hyperglycemia: Secondary | ICD-10-CM

## 2020-10-27 DIAGNOSIS — IMO0002 Reserved for concepts with insufficient information to code with codable children: Secondary | ICD-10-CM

## 2020-10-27 DIAGNOSIS — M5416 Radiculopathy, lumbar region: Secondary | ICD-10-CM

## 2020-10-27 DIAGNOSIS — E1142 Type 2 diabetes mellitus with diabetic polyneuropathy: Secondary | ICD-10-CM

## 2020-10-27 DIAGNOSIS — E785 Hyperlipidemia, unspecified: Secondary | ICD-10-CM

## 2020-10-27 DIAGNOSIS — I10 Essential (primary) hypertension: Secondary | ICD-10-CM | POA: Diagnosis not present

## 2020-10-27 LAB — POCT GLYCOSYLATED HEMOGLOBIN (HGB A1C): Hemoglobin A1C: 8.9 % — AB (ref 4.0–5.6)

## 2020-10-27 MED ORDER — HYDRALAZINE HCL 100 MG PO TABS: 100.0000 mg | ORAL_TABLET | Freq: Three times a day (TID) | ORAL | 1 refills | Status: DC

## 2020-10-27 MED ORDER — ATORVASTATIN CALCIUM 80 MG PO TABS: 80.0000 mg | ORAL_TABLET | Freq: Every day | ORAL | 3 refills | Status: DC

## 2020-10-27 MED ORDER — OZEMPIC (1 MG/DOSE) 2 MG/1.5ML ~~LOC~~ SOPN: 1.0000 mg | PEN_INJECTOR | SUBCUTANEOUS | 1 refills | Status: DC

## 2020-10-27 MED ORDER — CYCLOBENZAPRINE HCL 5 MG PO TABS: 5.0000 mg | ORAL_TABLET | Freq: Three times a day (TID) | ORAL | 1 refills | Status: DC | PRN

## 2020-10-27 MED ORDER — LANTUS SOLOSTAR 100 UNIT/ML ~~LOC~~ SOPN: 60.0000 [IU] | PEN_INJECTOR | Freq: Every day | SUBCUTANEOUS | 5 refills | Status: DC

## 2020-10-27 MED ORDER — ALBUTEROL SULFATE HFA 108 (90 BASE) MCG/ACT IN AERS: 2.0000 | INHALATION_SPRAY | RESPIRATORY_TRACT | 1 refills | Status: DC | PRN

## 2020-10-27 NOTE — Patient Instructions (Signed)
Diabetes Basics  Diabetes (diabetes mellitus) is a long-term (chronic) disease. It occurs when the body does not properly use sugar (glucose) that is released from food after you eat. Diabetes may be caused by one or both of these problems:  Your pancreas does not make enough of a hormone called insulin.  Your body does not react in a normal way to insulin that it makes. Insulin lets sugars (glucose) go into cells in your body. This gives you energy. If you have diabetes, sugars cannot get into cells. This causes high blood sugar (hyperglycemia). Follow these instructions at home: How is diabetes treated? You may need to take insulin or other diabetes medicines daily to keep your blood sugar in balance. Take your diabetes medicines every day as told by your doctor. List your diabetes medicines here: Diabetes medicines - adjust insulin Long Acting Insulin:  Increase from 60 units up to 62 to 65  Begin checking your fasting blood sugar every morning - keep record on log or make sure always bring meter with you to appointments.  Goal range for fasting blood sugars:  80-150 - keep insulin dose the same    >150 - Increase insulin dose by 2-3 units and do not adjust again for 3 days    <80 or under 100 and feeling symptomatic - decrease insulin dose by 2-3 units and do not adjust again for 3 days  Example of how to titrate:  Fasting blood sugars around 180's every morning and I am taking 18 units daily Increase dose to 20 or 21, give at the same time for the next 3 days, and continue to watch morning fasting blood sugars. If after 3 days the blood sugar has not come into normal range, then can increase again, + 2 or 3 = 23 or 24 units, and then give 24 units daily for the next 3-4 days and continue to monitor the fasting blood sugars.  Please reach out to me and let me know when you get to fasting blood sugars in goal range and what dose. We will record the dose in your chart, and keep you  at that dose.  Follow up in 2-4 weeks. Follow up sooner if blood sugar too high or too low. Bring blood sugar log or glucometer with you to office visit. How do I manage my blood sugar?  Check your blood sugar levels using a blood glucose monitor as directed by your doctor. Your doctor will set treatment goals for you. Generally, you should have these blood sugar levels:  Before meals (preprandial): 80-130 mg/dL (4.4-7.2 mmol/L).  After meals (postprandial): below 180 mg/dL (10 mmol/L).  A1c level: less than 7%.  What do I need to know about low blood sugar? Low blood sugar is called hypoglycemia. This is when blood sugar is at or below 70 mg/dL (3.9 mmol/L). Symptoms may include:  Feeling: ? Hungry. ? Worried or nervous (anxious). ? Sweaty and clammy. ? Confused. ? Dizzy. ? Sleepy. ? Sick to your stomach (nauseous).  Having: ? A fast heartbeat. ? A headache. ? A change in your vision. ? Tingling or no feeling (numbness) around the mouth, lips, or tongue. ? Jerky movements that you cannot control (seizure).  Having trouble with: ? Moving (coordination). ? Sleeping. ? Passing out (fainting). ? Getting upset easily (irritability). Treating low blood sugar To treat low blood sugar, eat or drink something sugary right away. If you can think clearly and swallow safely, follow the 15:15 rule:  Take  15 grams of a fast-acting carb (carbohydrate). Talk with your doctor about how much you should take.  Some fast-acting carbs are: ? Sugar tablets (glucose pills). Take 3-4 glucose pills. ? 6-8 pieces of hard candy. ? 4-6 oz (120-150 mL) of fruit juice. ? 4-6 oz (120-150 mL) of regular (not diet) soda. ? 1 Tbsp (15 mL) honey or sugar.  Check your blood sugar 15 minutes after you take the carb.  If your blood sugar is still at or below 70 mg/dL (3.9 mmol/L), take 15 grams of a carb again.  If your blood sugar does not go above 70 mg/dL (3.9 mmol/L) after 3 tries, get help  right away.  After your blood sugar goes back to normal, eat a meal or a snack within 1 hour. Treating very low blood sugar If your blood sugar is at or below 54 mg/dL (3 mmol/L), you have very low blood sugar (severe hypoglycemia). This is an emergency. Do not wait to see if the symptoms will go away. Get medical help right away. Call your local emergency services (911 in the [place] Do not drive yourself to the hospital. Questions to ask your health care provider  Do I need to meet with a diabetes educator?  What equipment will I need to care for myself at home?  What diabetes medicines do I need? When should I take them?  How often do I need to check my blood sugar?  What number can I call if I have questions?  When is my next doctor's visit?  Where can I find a support group for people with diabetes? Where to find more information  American Diabetes Association: www.diabetes.org  American Association of Diabetes Educators: www.diabeteseducator.org/patient-resources Contact a doctor if:  Your blood sugar is at or above 240 mg/dL (13.3 mmol/L) for 2 days in a row.  You have been sick or have had a fever for 2 days or more, and you are not getting better.  You have any of these problems for more than 6 hours: ? You cannot eat or drink. ? You feel sick to your stomach (nauseous). ? You throw up (vomit). ? You have watery poop (diarrhea). Get help right away if:  Your blood sugar is lower than 54 mg/dL (3 mmol/L).  You get confused.  You have trouble: ? Thinking clearly. ? Breathing. Summary  Diabetes (diabetes mellitus) is a long-term (chronic) disease. It occurs when the body does not properly use sugar (glucose) that is released from food after digestion.  Take insulin and diabetes medicines as told.  Check your blood sugar every day, as often as told.  Keep all follow-up visits as told by your doctor. This is important. This information is not intended to  replace advice given to you by your health care provider. Make sure you discuss any questions you have with your health care provider. Document Revised: 07/08/2019 Document Reviewed: 01/17/2018 Elsevier Patient Education  [place].

## 2020-10-27 NOTE — Progress Notes (Signed)
Name: Janice Jennings   MRN: [id number]    DOB: 04-Jan-1971   Date:10/27/2020       Progress Note  Chief Complaint  Patient presents with  . Diabetes  . Hypertension  . Hyperlipidemia     Subjective:   Janice Jennings is a 49 y.o. female, presents to clinic for routine f/up, has IDDM, has not been in for this in more than 9 months  Sharp   Chest Pain  This is a new problem. The current episode started yesterday. The onset quality is sudden. The problem occurs intermittently. The problem has been gradually improving (gone right now). Pain location: right upper anterior chest wall. The pain is at a severity of 6/10. The quality of the pain is described as sharp. The pain does not radiate. Associated symptoms include a cough. Pertinent negatives include no abdominal pain, back pain, claudication, diaphoresis, dizziness, exertional chest pressure, fever, headaches, hemoptysis, irregular heartbeat, leg pain, lower extremity edema, malaise/fatigue, nausea, near-syncope, numbness, orthopnea, palpitations, PND, shortness of breath, sputum production, syncope, vomiting or weakness. The cough is non-productive.     DM:   Pt managing DM lantus 60 units - night, morning blood sugars 180's  ozempic Sunday 1 mg Reports good med compliance Pt has no SE from meds. Denies: Polyuria, polydipsia, vision changes, neuropathy, hypoglycemia Recent pertinent labs:  Last A1C done with nephrology reviewed through care everywhere - uncontrolled 06/13/2020 8.5 Lab Results  Component Value Date   HGBA1C 7.2 (H) 11/11/2019   HGBA1C >14.0 (H) 08/10/2019   HGBA1C 9.5 (H) 04/10/2019   Standard of care and health maintenance: Foot exam:  due DM eye exam:  Due - Jan ACEI/ARB:  losartan Statin:  atorvastatin  HLD- on atorvastatin Lipids done with nephrology 06/13/2020   HTN- on amlodipine, carvedilol, losartan-HCTZ and hydralazine Pt reports good med compliance and denies any SE.   Blood pressure today  is well controlled. BP Readings from Last 3 Encounters:  10/27/20 128/84  07/07/20 136/84  03/11/20 118/74   Pt denies CP, SOB, exertional sx, LE edema, palpitation, Ha's, visual disturbances, lightheadedness, hypotension, syncope.  CKD stage 3- sees Dr. Juleen China- Nephrology - secondary hyperparathyroid - PTH elevated 140 protenuria  Renal function recent labs: 06/13/2020 sCr 1.56, eGFR 44 Lab Results  Component Value Date   GFRAA 39 (L) 11/11/2019   GFRAA 33 (L) 08/10/2019   GFRAA 33 (L) 04/10/2019    Lab Results  Component Value Date   CREATININE 1.73 (H) 11/11/2019   BUN 27 (H) 11/11/2019   NA 141 11/11/2019   K 4.0 11/11/2019   CL 105 11/11/2019   CO2 29 11/11/2019   Anemia -  Hemoglobin  Date Value Ref Range Status  04/10/2019 11.6 (L) 11.7 - 15.5 g/dL Final  11/15/2018 10.4 (L) 12.0 - 15.0 g/dL Final  11/14/2018 11.6 (L) 12.0 - 15.0 g/dL Final  06/19/2018 11.7 11.7 - 15.5 g/dL Final  03/04/2018 10.1 (L) 11.1 - 15.9 g/dL Final   HGB  Date Value Ref Range Status  02/14/2013 14.6 12.0 - 16.0 g/dL Final   Last H/H from nrpheoloty 06/13/2020 11.6/30.6    Current Outpatient Medications:  .  albuterol (PROVENTIL HFA;VENTOLIN HFA) 108 (90 Base) MCG/ACT inhaler, Inhale 2 puffs into the lungs every 4 (four) hours as needed for wheezing or shortness of breath., Disp: 1 Inhaler, Rfl: 1 .  amLODipine (NORVASC) 10 MG tablet, Take 1 tablet (10 mg total) by mouth daily., Disp: 90 tablet, Rfl: 3 .  atorvastatin (  LIPITOR) 80 MG tablet, TAKE 1 TABLET BY MOUTH ONCE DAILY FOR CHOLESTEROL, Disp: 30 tablet, Rfl: 0 .  carvedilol (COREG) 6.25 MG tablet, Take 1 tablet (6.25 mg total) by mouth 2 (two) times daily with a meal., Disp: 180 tablet, Rfl: 3 .  cyclobenzaprine (FLEXERIL) 5 MG tablet, Take 1 tablet (5 mg total) by mouth 3 (three) times daily as needed for muscle spasms., Disp: 30 tablet, Rfl: 1 .  ezetimibe (ZETIA) 10 MG tablet, Take 1 tablet (10 mg total) by mouth daily., Disp:  90 tablet, Rfl: 3 .  ferrous sulfate 325 (65 FE) MG tablet, Take 325 mg by mouth daily with breakfast., Disp: , Rfl:  .  gabapentin (NEURONTIN) 300 MG capsule, TAKE 1 CAPSULE BY MOUTH IN  THE MORNING AND 1 CAPSULE  BY MOUTH IN THE AFTERNOON  TAKE 2 CAPSULES BY MOUTH  BEFORE BEDTIME, Disp: 360 capsule, Rfl: 3 .  hydrALAZINE (APRESOLINE) 100 MG tablet, TAKE 1 TABLET BY MOUTH 3  TIMES DAILY, Disp: 270 tablet, Rfl: 2 .  LANTUS SOLOSTAR 100 UNIT/ML Solostar Pen, INJECT 50 UNITS INTO THE SKIN DAILY., Disp: 15 mL, Rfl: 0 .  losartan-hydrochlorothiazide (HYZAAR) 50-12.5 MG tablet, Take 1 tablet by mouth daily., Disp: 90 tablet, Rfl: 3 .  Multiple Vitamin (MULTIVITAMIN) tablet, Take 1 tablet by mouth daily., Disp: , Rfl:  .  Semaglutide, 1 MG/DOSE, (OZEMPIC, 1 MG/DOSE,) 2 MG/1.5ML SOPN, Inject 1 mg into the skin once a week., Disp: 3 pen, Rfl: 3 .  venlafaxine XR (EFFEXOR-XR) 75 MG 24 hr capsule, Take 1 capsule (75 mg total) by mouth daily with breakfast., Disp: 90 capsule, Rfl: 3 .  ACCU-CHEK GUIDE test strip, CHECK BLOOD SUGAR UP TO 3  TIMES DAILY AS DIRECTED (Patient not taking: Reported on 10/27/2020), Disp: 100 strip, Rfl: 9 .  Accu-Chek Softclix Lancets lancets, CHECK BLOOD SUGAR UP TO 3  TIMES DAILY AS DIRECTED (Patient not taking: Reported on 10/27/2020), Disp: 100 each, Rfl: 9 .  blood glucose meter kit and supplies KIT, Dispense based on patient and insurance preference. Use up to four times daily as directed. (FOR ICD-9 250.00, 250.01). (Patient not taking: Reported on 10/27/2020), Disp: 1 each, Rfl: 0 .  blood glucose meter kit and supplies KIT, Use up to three times daily as directed for blood sugar monitoring. (FOR ICD-10 E11.65 Z79.4) (Patient not taking: Reported on 10/27/2020), Disp: 1 each, Rfl: 0 .  Insulin Pen Needle (NOVOFINE) 32G X 6 MM MISC, 1 each by Does not apply route daily. (Patient not taking: Reported on 10/27/2020), Disp: 100 each, Rfl: 3 .  lidocaine (XYLOCAINE) 2 % jelly, Apply  1 application topically 3 (three) times daily as needed. To affected areas (Patient not taking: No sig reported), Disp: 40 mL, Rfl: 1 .  lidocaine (XYLOCAINE) 5 % ointment, Apply 1 application topically 4 (four) times daily as needed. (Patient not taking: No sig reported), Disp: 50 g, Rfl: 2  Patient Active Problem List   Diagnosis Date Noted  . Hot flashes due to menopause 07/08/2020  . Osteoarthritis of knee 07/08/2020  . Herpes simplex vulvovaginitis 11/10/2019  . Lumbar radiculopathy 11/10/2019  . Type 2 diabetes mellitus with hyperglycemia, with long-term current use of insulin ([place]) 09/22/2019  . Insulin dependent type 2 diabetes mellitus, uncontrolled ([place]) 09/22/2019  . Anemia in chronic kidney disease 08/31/2019  . Benign hypertensive kidney disease with chronic kidney disease 08/31/2019  . Proteinuria 08/31/2019  . Type 2 diabetes mellitus with diabetic chronic kidney disease ([place])  08/31/2019  . Hyperlipidemia associated with type 2 diabetes mellitus ([place]) 08/10/2019  . Stage 3b chronic kidney disease ([place]) 08/10/2019  . Hemoptysis 11/15/2018  . Asthma   . Acute respiratory failure with hypoxia ([place])   . Class 1 obesity with serious comorbidity and body mass index (BMI) of 34.0 to 34.9 in adult 09/22/2018  . Anemia 06/19/2018  . Diabetic retinopathy of both eyes associated with type 2 diabetes mellitus ([place]) 04/11/2018  . Essential hypertension 03/07/2015    Past Surgical History:  Procedure Laterality Date  . BREAST REDUCTION SURGERY  2001  . CESAREAN SECTION    . COLONOSCOPY WITH PROPOFOL N/A 08/15/2018   Procedure: COLONOSCOPY WITH PROPOFOL;  Surgeon: Jonathon Bellows, MD;  Location[facility] ENDOSCOPY;  Service: Gastroenterology;  Laterality: N/A;  . ESOPHAGOGASTRODUODENOSCOPY (EGD) WITH PROPOFOL N/A 08/15/2018   Procedure: ESOPHAGOGASTRODUODENOSCOPY (EGD) WITH PROPOFOL;  Surgeon: Jonathon Bellows, MD;  Location: [facility] ENDOSCOPY;  Service: Gastroenterology;  Laterality: N/A;  .  GIVENS CAPSULE STUDY N/A 09/08/2018   Procedure: GIVENS CAPSULE STUDY;  Surgeon: Jonathon Bellows, MD;  Location: [facility] ENDOSCOPY;  Service: Gastroenterology;  Laterality: N/A;  . REDUCTION MAMMAPLASTY Bilateral 2000  . TUBAL LIGATION      Family History  Problem Relation Age of Onset  . Diabetes Father   . Hyperlipidemia Father   . Hypertension Father   . Cancer Maternal Aunt 40       leukemia - died  . Diabetes Maternal Grandmother   . Cancer Maternal Grandmother        breast cancer  . Stroke Maternal Grandmother   . Breast cancer Maternal Grandmother   . Diabetes Daughter        Pre-diabetes  . Diabetes Maternal Grandfather   . Hyperlipidemia Maternal Grandfather   . Hypertension Maternal Grandfather   . Cancer Maternal Aunt 50       breast cancer - died  . Ovarian cancer Neg Hx   . Colon cancer Neg Hx     Social History   Tobacco Use  . Smoking status: Never Smoker  . Smokeless tobacco: Never Used  Vaping Use  . Vaping Use: Never used  Substance Use Topics  . Alcohol use: Yes    Alcohol/week: 0.0 standard drinks    Comment: occ  . Drug use: No     No Known Allergies  Health Maintenance  Topic Date Due  . FOOT EXAM  12/03/2019  . HEMOGLOBIN A1C  05/10/2020  . OPHTHALMOLOGY EXAM  11/01/2020  . PAP SMEAR-Modifier  03/04/2021  . MAMMOGRAM  09/14/2021  . TETANUS/TDAP  05/26/2024  . COLONOSCOPY (Pts 45-53yr Insurance coverage will need to be confirmed)  08/15/2028  . INFLUENZA VACCINE  Completed  . PNEUMOCOCCAL POLYSACCHARIDE VACCINE AGE 10-64 HIGH RISK  Completed  . COVID-19 Vaccine  Completed  . Hepatitis C Screening  Completed  . HIV Screening  Completed    Chart Review Today: I personally reviewed active problem list, medication list, allergies, family history, social history, health maintenance, notes from last encounter, lab results, imaging with the patient/caregiver today.   Review of Systems  Constitutional: Negative.  Negative for diaphoresis,  fever and malaise/fatigue.  HENT: Negative.   Eyes: Negative.   Respiratory: Positive for cough. Negative for hemoptysis, sputum production and shortness of breath.   Cardiovascular: Positive for chest pain. Negative for palpitations, orthopnea, claudication, syncope, PND and near-syncope.  Gastrointestinal: Negative.  Negative for abdominal pain, nausea and vomiting.  Endocrine: Negative.   Genitourinary: Negative.   Musculoskeletal: Negative.  Negative for back pain.  Skin: Negative.   Allergic/Immunologic: Negative.   Neurological: Negative.  Negative for dizziness, weakness, numbness and headaches.  Hematological: Negative.   Psychiatric/Behavioral: Negative.   All other systems reviewed and are negative.    Objective:   Vitals:   10/27/20 0947  BP: 128/84  Pulse: 94  Resp: 16  Temp: 98.3 F (36.8 C)  TempSrc: Oral  SpO2: 97%  Weight: 203 lb 3.2 oz (92.2 kg)  Height: '5\' 4"'  (1.626 m)    Body mass index is 34.88 kg/m.  Physical Exam Vitals and nursing note reviewed.  Constitutional:      General: She is not in acute distress.    Appearance: Normal appearance. She is well-developed. She is obese. She is not ill-appearing, toxic-appearing or diaphoretic.     Interventions: Face mask in place.  HENT:     Head: Normocephalic and atraumatic.     Right Ear: External ear normal.     Left Ear: External ear normal.  Eyes:     General: Lids are normal. No scleral icterus.       Right eye: No discharge.        Left eye: No discharge.     Conjunctiva/sclera: Conjunctivae normal.  Neck:     Trachea: Phonation normal. No tracheal deviation.  Cardiovascular:     Rate and Rhythm: Normal rate and regular rhythm.     Pulses: Normal pulses.          Radial pulses are 2+ on the right side and 2+ on the left side.       Posterior tibial pulses are 2+ on the right side and 2+ on the left side.     Heart sounds: Normal heart sounds. No murmur heard. No friction rub. No gallop.    Pulmonary:     Effort: Pulmonary effort is normal. No respiratory distress.     Breath sounds: Normal breath sounds. No stridor. No wheezing, rhonchi or rales.  Chest:     Chest wall: Tenderness present.  Abdominal:     General: Bowel sounds are normal. There is no distension.     Palpations: Abdomen is soft.  Musculoskeletal:     Right lower leg: No edema.     Left lower leg: No edema.  Skin:    General: Skin is warm and dry.     Coloration: Skin is not jaundiced or pale.     Findings: No rash.  Neurological:     Mental Status: She is alert.     Motor: No abnormal muscle tone.     Gait: Gait normal.  Psychiatric:        Mood and Affect: Mood normal.        Speech: Speech normal.        Behavior: Behavior normal.      Diabetic Foot Exam - Simple   Simple Foot Form Diabetic Foot exam was performed with the following findings: Yes 10/27/2020 10:20 AM  Visual Inspection Sensation Testing Pulse Check Comments       Assessment & Plan:   1. Insulin dependent type 2 diabetes mellitus, uncontrolled (HCC) Last A1C 8.5 on 06/13/2020 Today POC: Lab Results  Component Value Date   HGBA1C 8.9 (A) 10/27/2020    On insulin - reviewed titration of insulin and encouraged her to work on diet - goal to have fasting morning CBG 100-150's Uncontrolled, high risk pt - POCT HgB A1C     ICD-10-CM   1. Insulin dependent  type 2 diabetes mellitus, uncontrolled (HCC)  E11.65 POCT HgB A1C   Z79.4 insulin glargine (LANTUS SOLOSTAR) 100 UNIT/ML Solostar Pen    Semaglutide, 1 MG/DOSE, (OZEMPIC, 1 MG/DOSE,) 2 MG/1.5ML SOPN   uncontrolled, titrate lantus to morning fasting blood sugar 100 to less than 150, work on diet, close f/up  2. Essential hypertension  I10 hydrALAZINE (APRESOLINE) 100 MG tablet   meds refilled, personally reviewed labs through care everywhere and last Nephrology OV with Dr. Juleen China, BP at goal today  3. Stage 3b chronic kidney disease (HCC)  N18.32    reviewed last  labs here and through care everywhere - per nephrology  4. Hyperlipidemia associated with type 2 diabetes mellitus (HCC)  E11.69 atorvastatin (LIPITOR) 80 MG tablet   E78.5    last lipids at goal, reviewed labs done with nephrology, will need LFTs/CMP next OV, continue zetia, lipitor 80 and healthy diet/exercise  5. Mild intermittent asthma without complication  F63.84 albuterol (VENTOLIN HFA) 108 (90 Base) MCG/ACT inhaler   mild cough, no recent asthma flare - refills on rescue inhaler  6. Class 1 obesity with serious comorbidity and body mass index (BMI) of 34.0 to 34.9 in adult, unspecified obesity type  E66.9    Z68.34    weight stable  7. Secondary hyperparathyroidism of renal origin ([place])  N25.81    new? reviewed labs, per nephrology - started on Vit D supplement  8. Chest pain, unspecified type  R07.9 DG Chest 2 View   right upper anterior chest wall, onset last night 9:15 when at rest, reproducible in clinic with palpation, no associated cardiac sx, screening CXR,likely MSK  9. Medication refill  Z76.0    left shoulder arm/upper back - seeing ortho, requests flexeril for low back and left hip pain and muscle spasm  10. Lumbar radiculopathy  M54.16 cyclobenzaprine (FLEXERIL) 5 MG tablet   intermittent sx, sees ortho prn, refill on flexeril  11. DM type 2 with diabetic peripheral neuropathy (HCC)  E11.42 insulin glargine (LANTUS SOLOSTAR) 100 UNIT/ML Solostar Pen    Semaglutide, 1 MG/DOSE, (OZEMPIC, 1 MG/DOSE,) 2 MG/1.5ML SOPN  12. Anemia due to stage 3b chronic kidney disease (HCC)  N18.32    D63.1    H/H stable, on iron supplement   Chest pain, unlikely to be cardiac in nature - lungs clear, no SOB, fever, sweats, chills, no pain with inspiration today, no associated cardiac sx.  VSS Tenderness reproducible with palpation of upper anterior chest wall - suspect MSK - screening  CXR ordered - discussed with pt concerning signs and or sx that warrant UC f/up or ER evaluation - she  verbalized understanding.  Needs 3 month f/up for uncontrolled DM and insulin  titration    Return in about 3 months (around 01/25/2021) for for DM follow up - need every 3 months when managed with insulin.   Delsa Grana, PA-C 10/27/20 9:51 AM

## 2020-11-06 ENCOUNTER — Other Ambulatory Visit: Payer: Self-pay | Admitting: Family Medicine

## 2020-11-06 DIAGNOSIS — E1142 Type 2 diabetes mellitus with diabetic polyneuropathy: Secondary | ICD-10-CM

## 2020-12-31 ENCOUNTER — Other Ambulatory Visit: Payer: Self-pay | Admitting: Family Medicine

## 2020-12-31 DIAGNOSIS — E1142 Type 2 diabetes mellitus with diabetic polyneuropathy: Secondary | ICD-10-CM

## 2020-12-31 DIAGNOSIS — IMO0002 Reserved for concepts with insufficient information to code with codable children: Secondary | ICD-10-CM

## 2020-12-31 DIAGNOSIS — E1165 Type 2 diabetes mellitus with hyperglycemia: Secondary | ICD-10-CM

## 2021-01-18 LAB — HM DIABETES EYE EXAM

## 2021-01-26 ENCOUNTER — Ambulatory Visit: Payer: 59 | Admitting: Family Medicine

## 2021-03-14 ENCOUNTER — Encounter: Payer: Self-pay | Admitting: Family Medicine

## 2021-03-14 ENCOUNTER — Other Ambulatory Visit: Payer: Self-pay

## 2021-03-14 ENCOUNTER — Ambulatory Visit (INDEPENDENT_AMBULATORY_CARE_PROVIDER_SITE_OTHER): Payer: 59 | Admitting: Family Medicine

## 2021-03-14 VITALS — BP 130/80 | HR 98 | Temp 98.1°F | Resp 16 | Ht 64.0 in | Wt 205.5 lb

## 2021-03-14 DIAGNOSIS — E66811 Obesity, class 1: Secondary | ICD-10-CM

## 2021-03-14 DIAGNOSIS — E1165 Type 2 diabetes mellitus with hyperglycemia: Secondary | ICD-10-CM | POA: Diagnosis not present

## 2021-03-14 DIAGNOSIS — IMO0002 Reserved for concepts with insufficient information to code with codable children: Secondary | ICD-10-CM

## 2021-03-14 DIAGNOSIS — E1142 Type 2 diabetes mellitus with diabetic polyneuropathy: Secondary | ICD-10-CM

## 2021-03-14 DIAGNOSIS — E611 Iron deficiency: Secondary | ICD-10-CM

## 2021-03-14 DIAGNOSIS — N1832 Chronic kidney disease, stage 3b: Secondary | ICD-10-CM

## 2021-03-14 DIAGNOSIS — Z5181 Encounter for therapeutic drug level monitoring: Secondary | ICD-10-CM

## 2021-03-14 DIAGNOSIS — I10 Essential (primary) hypertension: Secondary | ICD-10-CM

## 2021-03-14 DIAGNOSIS — E785 Hyperlipidemia, unspecified: Secondary | ICD-10-CM

## 2021-03-14 DIAGNOSIS — Z6835 Body mass index (BMI) 35.0-35.9, adult: Secondary | ICD-10-CM

## 2021-03-14 DIAGNOSIS — E1169 Type 2 diabetes mellitus with other specified complication: Secondary | ICD-10-CM

## 2021-03-14 DIAGNOSIS — N2581 Secondary hyperparathyroidism of renal origin: Secondary | ICD-10-CM

## 2021-03-14 DIAGNOSIS — J4520 Mild intermittent asthma, uncomplicated: Secondary | ICD-10-CM

## 2021-03-14 DIAGNOSIS — E66812 Obesity, class 2: Secondary | ICD-10-CM

## 2021-03-14 DIAGNOSIS — E669 Obesity, unspecified: Secondary | ICD-10-CM

## 2021-03-14 DIAGNOSIS — D631 Anemia in chronic kidney disease: Secondary | ICD-10-CM

## 2021-03-14 DIAGNOSIS — Z794 Long term (current) use of insulin: Secondary | ICD-10-CM

## 2021-03-14 DIAGNOSIS — Z6834 Body mass index (BMI) 34.0-34.9, adult: Secondary | ICD-10-CM

## 2021-03-14 LAB — POCT GLYCOSYLATED HEMOGLOBIN (HGB A1C): Hemoglobin A1C: 8.9 % — AB (ref 4.0–5.6)

## 2021-03-14 MED ORDER — EZETIMIBE 10 MG PO TABS
10.0000 mg | ORAL_TABLET | Freq: Every day | ORAL | 3 refills | Status: DC
Start: 2021-03-14 — End: 2021-06-09

## 2021-03-14 MED ORDER — CARVEDILOL 6.25 MG PO TABS: 6.2500 mg | ORAL_TABLET | Freq: Two times a day (BID) | ORAL | 3 refills | Status: DC

## 2021-03-14 MED ORDER — OZEMPIC (1 MG/DOSE) 2 MG/1.5ML ~~LOC~~ SOPN: 2.0000 mg | PEN_INJECTOR | SUBCUTANEOUS | 1 refills | Status: DC

## 2021-03-14 MED ORDER — LANTUS SOLOSTAR 100 UNIT/ML ~~LOC~~ SOPN: PEN_INJECTOR | SUBCUTANEOUS | 0 refills | Status: DC

## 2021-03-14 MED ORDER — INSULIN PEN NEEDLE 29G X 8MM MISC
1 refills | Status: DC
Start: 2021-03-14 — End: 2021-08-23

## 2021-03-14 MED ORDER — FREESTYLE LIBRE 2 SENSOR MISC: 0 refills | Status: DC

## 2021-03-14 NOTE — Progress Notes (Signed)
Name: Janice Jennings   MRN: [id number]    DOB: 29-Jun-1971   Date:03/14/2021       Progress Note  Chief Complaint  Patient presents with  . Diabetes  . Hyperlipidemia  . Hypertension     Subjective:   Janice Jennings is a 50 y.o. female, presents to clinic for routine f/up  DM:  Uncontrolled - poor f/up over the last year IDDM  Pt managing DM with ozempic 1 lantus 72 dose in the evening Reports good med compliance Pt has no SE from meds Blood sugars 82 this am, only two low readings in the past 6 month, high has been 130 -   Denies: Polyuria, polydipsia, vision changes, hypoglycemia No change to baseline neuropathy - needs refill on gabapentin Recent pertinent labs: Lab Results  Component Value Date   HGBA1C 8.9 (A) 10/27/2020   HGBA1C 7.2 (H) 11/11/2019   HGBA1C >14.0 (H) 08/10/2019   Standard of care and health maintenance: Foot exam:  done DM eye exam:  done ACEI/ARB:  losartan Statin:  lipitor  Hyperlipidemia: Currently treated with lipitor 80 and zetia, pt reports good med compliance Last Lipids: Lab Results  Component Value Date   CHOL 126 08/10/2019   HDL 40 (L) 08/10/2019   LDLCALC 66 08/10/2019   TRIG 123 08/10/2019   CHOLHDL 3.2 08/10/2019   - Denies: Chest pain, shortness of breath, myalgias, claudication   Hypertension:  Currently managed on losartan-HCTZ, carvedilol amlodipine hydralazine Pt reports good med compliance and denies any SE.   Blood pressure today is well controlled. BP Readings from Last 3 Encounters:  03/14/21 130/80  10/27/20 128/84  07/07/20 136/84   Pt denies CP, SOB, exertional sx, LE edema, palpitation, Ha's, visual disturbances, lightheadedness, hypotension, syncope  Anemia - of chronic disease? Sees nephrology but last f/up appt and labs was last 05/2020 2nd hyperparathyroid Was on iron supplement and Vit D- also checked by nephro  Asthma and allergies - nasal sx a little worse with spring and pollen, but asthma well  controlled with only rescue inhaler        Current Outpatient Medications:  .  albuterol (VENTOLIN HFA) 108 (90 Base) MCG/ACT inhaler, Inhale 2 puffs into the lungs every 4 (four) hours as needed for wheezing or shortness of breath., Disp: 1 each, Rfl: 1 .  amLODipine (NORVASC) 10 MG tablet, Take 1 tablet (10 mg total) by mouth daily., Disp: 90 tablet, Rfl: 3 .  atorvastatin (LIPITOR) 80 MG tablet, Take 1 tablet (80 mg total) by mouth at bedtime., Disp: 90 tablet, Rfl: 3 .  carvedilol (COREG) 6.25 MG tablet, Take 1 tablet (6.25 mg total) by mouth 2 (two) times daily with a meal., Disp: 180 tablet, Rfl: 3 .  cyclobenzaprine (FLEXERIL) 5 MG tablet, Take 1 tablet (5 mg total) by mouth 3 (three) times daily as needed for muscle spasms., Disp: 30 tablet, Rfl: 1 .  ezetimibe (ZETIA) 10 MG tablet, Take 1 tablet (10 mg total) by mouth daily., Disp: 90 tablet, Rfl: 3 .  ferrous sulfate 325 (65 FE) MG tablet, Take 325 mg by mouth daily with breakfast., Disp: , Rfl:  .  gabapentin (NEURONTIN) 300 MG capsule, TAKE 1 CAPSULE BY MOUTH IN  THE MORNING AND 1 CAPSULE  BY MOUTH IN THE AFTERNOON  TAKE 2 CAPSULES BY MOUTH  BEFORE BEDTIME, Disp: 360 capsule, Rfl: 3 .  hydrALAZINE (APRESOLINE) 100 MG tablet, Take 1 tablet (100 mg total) by mouth 3 (three) times daily., Disp: 270 tablet,  Rfl: 1 .  LANTUS SOLOSTAR 100 UNIT/ML Solostar Pen, INJECT SUBCUTANEOUSLY 60 TO 80 UNITS DAILY, Disp: 75 mL, Rfl: 0 .  losartan-hydrochlorothiazide (HYZAAR) 50-12.5 MG tablet, Take 1 tablet by mouth daily., Disp: 90 tablet, Rfl: 3 .  Multiple Vitamin (MULTIVITAMIN) tablet, Take 1 tablet by mouth daily., Disp: , Rfl:  .  OZEMPIC, 1 MG/DOSE, 4 MG/3ML SOPN, INJECT 1 MG INTO THE SKIN ONCE A WEEK., Disp: 9 mL, Rfl: 2 .  venlafaxine XR (EFFEXOR-XR) 75 MG 24 hr capsule, Take 1 capsule (75 mg total) by mouth daily with breakfast., Disp: 90 capsule, Rfl: 3  Patient Active Problem List   Diagnosis Date Noted  . Secondary  hyperparathyroidism of renal origin ([place]) 10/27/2020  . Mild intermittent asthma without complication 35/36/1443  . Hot flashes due to menopause 07/08/2020  . Osteoarthritis of knee 07/08/2020  . Herpes simplex vulvovaginitis 11/10/2019  . Lumbar radiculopathy 11/10/2019  . Type 2 diabetes mellitus with hyperglycemia, with long-term current use of insulin ([place]) 09/22/2019  . Insulin dependent type 2 diabetes mellitus, uncontrolled ([place]) 09/22/2019  . Anemia in chronic kidney disease 08/31/2019  . Benign hypertensive kidney disease with chronic kidney disease 08/31/2019  . Proteinuria 08/31/2019  . Type 2 diabetes mellitus with diabetic chronic kidney disease ([place]) 08/31/2019  . Hyperlipidemia associated with type 2 diabetes mellitus ([place]) 08/10/2019  . Stage 3b chronic kidney disease ([place]) 08/10/2019  . Hemoptysis 11/15/2018  . Asthma   . Class 1 obesity with serious comorbidity and body mass index (BMI) of 34.0 to 34.9 in adult 09/22/2018  . Anemia 06/19/2018  . Diabetic retinopathy of both eyes associated with type 2 diabetes mellitus ([place]) 04/11/2018  . Essential hypertension 03/07/2015    Past Surgical History:  Procedure Laterality Date  . BREAST REDUCTION SURGERY  2001  . CESAREAN SECTION    . COLONOSCOPY WITH PROPOFOL N/A 08/15/2018   Procedure: COLONOSCOPY WITH PROPOFOL;  Surgeon: Jonathon Bellows, MD;  Location: [facility] ENDOSCOPY;  Service: Gastroenterology;  Laterality: N/A;  . ESOPHAGOGASTRODUODENOSCOPY (EGD) WITH PROPOFOL N/A 08/15/2018   Procedure: ESOPHAGOGASTRODUODENOSCOPY (EGD) WITH PROPOFOL;  Surgeon: Jonathon Bellows, MD;  Location[facility] ENDOSCOPY;  Service: Gastroenterology;  Laterality: N/A;  . GIVENS CAPSULE STUDY N/A 09/08/2018   Procedure: GIVENS CAPSULE STUDY;  Surgeon: Jonathon Bellows, MD;  Location[facility] ENDOSCOPY;  Service: Gastroenterology;  Laterality: N/A;  . REDUCTION MAMMAPLASTY Bilateral 2000  . TUBAL LIGATION      Family History  Problem Relation Age of Onset  .  Diabetes Father   . Hyperlipidemia Father   . Hypertension Father   . Cancer Maternal Aunt 26       leukemia - died  . Diabetes Maternal Grandmother   . Cancer Maternal Grandmother        breast cancer  . Stroke Maternal Grandmother   . Breast cancer Maternal Grandmother   . Diabetes Daughter        Pre-diabetes  . Diabetes Maternal Grandfather   . Hyperlipidemia Maternal Grandfather   . Hypertension Maternal Grandfather   . Cancer Maternal Aunt 50       breast cancer - died  . Ovarian cancer Neg Hx   . Colon cancer Neg Hx     Social History   Tobacco Use  . Smoking status: Never Smoker  . Smokeless tobacco: Never Used  Vaping Use  . Vaping Use: Never used  Substance Use Topics  . Alcohol use: Yes    Alcohol/week: 0.0 standard drinks    Comment: occ  .  Drug use: No     No Known Allergies  Health Maintenance  Topic Date Due  . PAP SMEAR-Modifier  03/04/2021  . HEMOGLOBIN A1C  04/27/2021  . INFLUENZA VACCINE  05/29/2021  . MAMMOGRAM  09/14/2021  . FOOT EXAM  10/27/2021  . OPHTHALMOLOGY EXAM  01/18/2022  . TETANUS/TDAP  05/26/2024  . COLONOSCOPY (Pts 45-6yrs Insurance coverage will need to be confirmed)  08/15/2028  . PNEUMOCOCCAL POLYSACCHARIDE VACCINE AGE 36-64 HIGH RISK  Completed  . COVID-19 Vaccine  Completed  . Hepatitis C Screening  Completed  . HIV Screening  Completed  . HPV VACCINES  Aged Out    Chart Review Today: I personally reviewed active problem list, medication list, allergies, family history, social history, health maintenance, notes from last encounter, lab results, imaging with the patient/caregiver today.   Review of Systems  Constitutional: Negative.   HENT: Negative.   Eyes: Negative.   Respiratory: Negative.   Cardiovascular: Negative.   Gastrointestinal: Negative.   Endocrine: Negative.   Genitourinary: Negative.   Musculoskeletal: Negative.   Skin: Negative.   Allergic/Immunologic: Negative.   Neurological: Negative.    Hematological: Negative.   Psychiatric/Behavioral: Negative.   All other systems reviewed and are negative.    Objective:   Vitals:   03/14/21 0849  BP: 130/80  Pulse: 98  Resp: 16  Temp: 98.1 F (36.7 C)  SpO2: 99%  Weight: 205 lb 8 oz (93.2 kg)  Height: 5\' 4"  (1.626 m)    Body mass index is 35.27 kg/m.  Physical Exam Vitals and nursing note reviewed.  Constitutional:      General: She is not in acute distress.    Appearance: She is well-developed. She is obese. She is not ill-appearing, toxic-appearing or diaphoretic.  HENT:     Head: Normocephalic and atraumatic.     Right Ear: External ear normal.     Left Ear: External ear normal.     Nose: Nose normal.  Eyes:     General:        Right eye: No discharge.        Left eye: No discharge.     Conjunctiva/sclera: Conjunctivae normal.  Neck:     Trachea: No tracheal deviation.  Cardiovascular:     Rate and Rhythm: Normal rate and regular rhythm.     Pulses: Normal pulses.     Heart sounds: Normal heart sounds.  Pulmonary:     Effort: Pulmonary effort is normal. No respiratory distress.     Breath sounds: Normal breath sounds. No stridor. No wheezing or rales.  Abdominal:     General: Bowel sounds are normal. There is no distension.     Tenderness: There is no abdominal tenderness.  Musculoskeletal:     Right lower leg: No edema.     Left lower leg: No edema.     Comments: Limited left shoulder ROM  Skin:    General: Skin is warm and dry.     Coloration: Skin is not jaundiced or pale.     Findings: No lesion or rash.  Neurological:     Mental Status: She is alert. Mental status is at baseline.     Motor: No abnormal muscle tone.     Coordination: Coordination normal.     Gait: Gait normal.  Psychiatric:        Mood and Affect: Mood normal.        Behavior: Behavior normal.         Assessment & Plan:  1. Insulin dependent type 2 diabetes mellitus, uncontrolled (HCC) Uncontrolled, morning  blood sugars are in range but A1C continues to be elevated same as last labs -  Freestyle libre 2 sample given to pt, shown how to use app F/up in 1 month with CGM info Increase ozempic dose  mornign sugars have been 80's - no room to increase lantus w/o hypoglycemia  Refills on meds entered - COMPLETE METABOLIC PANEL WITH GFR - POCT HgB A1C - insulin glargine (LANTUS SOLOSTAR) 100 UNIT/ML Solostar Pen; INJECT SUBCUTANEOUSLY 60 TO 80 UNITS DAILY  Dispense: 75 mL; Refill: 0  2. DM type 2 with diabetic peripheral neuropathy (HCC) See above - COMPLETE METABOLIC PANEL WITH GFR - insulin glargine (LANTUS SOLOSTAR) 100 UNIT/ML Solostar Pen; INJECT SUBCUTANEOUSLY 60 TO 80 UNITS DAILY  Dispense: 75 mL; Refill: 0 - Insulin Pen Needle 29G X 8MM MISC; Use as directed with insulin daily  Dispense: 100 each; Refill: 1  3. Essential hypertension Stable, well controlled, BP at goal today on multiple meds, hydralazine, amlodipine, coreg, losartan-HCTZ, continue meds, diet/lifestyle efforts - carvedilol (COREG) 6.25 MG tablet; Take 1 tablet (6.25 mg total) by mouth 2 (two) times daily with a meal.  Dispense: 180 tablet; Refill: 3 - COMPLETE METABOLIC PANEL WITH GFR  4. Hyperlipidemia associated with type 2 diabetes mellitus ([place]) Due for labs, good statin compliance - on high intensity statin lipitor 80 and zetia - ezetimibe (ZETIA) 10 MG tablet; Take 1 tablet (10 mg total) by mouth daily.  Dispense: 90 tablet; Refill: 3 - COMPLETE METABOLIC PANEL WITH GFR - Lipid panel  5.  Class 2 severe obesity with serious comorbidity and body mass index (BMI) of 35.0 to 35.9 in adult, unspecified obesity type (HCC) mutliple comorbidities - IDDM, HTN, HLD - COMPLETE METABOLIC PANEL WITH GFR - Lipid panel  6. Mild intermittent asthma without complication Stable well controlled currently with only rescue inhaler  7. Secondary hyperparathyroidism of renal origin [facility]) Managed by nephrology - pt did not go to last  appt - urged to reschedule - COMPLETE METABOLIC PANEL WITH GFR - VITAMIN D 25 Hydroxy (Vit-D Deficiency, Fractures) - Parathyroid hormone, intact (no Ca) - Phosphorus  8. Anemia in stage 3b chronic kidney disease (HCC) - CBC with Differential/Platelet - Iron, TIBC and Ferritin Panel  9. Iron deficiency Recheck labs and adjust supplement as needed - CBC with Differential/Platelet - Iron, TIBC and Ferritin Panel  10. Encounter for medication monitoring - COMPLETE METABOLIC PANEL WITH GFR - Lipid panel - CBC with Differential/Platelet - VITAMIN D 25 Hydroxy (Vit-D Deficiency, Fractures) - Parathyroid hormone, intact (no Ca) - Iron, TIBC and Ferritin Panel - Phosphorus   Left shoulder doing PT and Ortho, limited ROM some pain continued - nagging pain continued  [place] - Melrose Nakayama, MD - L shoulder f/up December appt and did PT - she called during appt and got a f/up appt scheduled  DM uncontrolled     Return in about 3 months (around 06/14/2021) for DM f/up .   Delsa Grana, PA-C 03/14/21 9:15 AM

## 2021-03-14 NOTE — Patient Instructions (Signed)
Get f/up with nephrology as soon as you can - take your lab results from here to them

## 2021-03-15 ENCOUNTER — Other Ambulatory Visit: Payer: Self-pay

## 2021-03-15 LAB — IRON,TIBC AND FERRITIN PANEL
%SAT: 22 % (calc) (ref 16–45)
Ferritin: 33 ng/mL (ref 16–232)
Iron: 67 ug/dL (ref 45–160)
TIBC: 302 mcg/dL (calc) (ref 250–450)

## 2021-03-15 LAB — CBC WITH DIFFERENTIAL/PLATELET
Absolute Monocytes: 389 cells/uL (ref 200–950)
Basophils Absolute: 30 cells/uL (ref 0–200)
Basophils Relative: 0.5 %
Eosinophils Absolute: 142 cells/uL (ref 15–500)
Eosinophils Relative: 2.4 %
HCT: 34.2 % — ABNORMAL LOW (ref 35.0–45.0)
Hemoglobin: 11.4 g/dL — ABNORMAL LOW (ref 11.7–15.5)
Lymphs Abs: 1634 cells/uL (ref 850–3900)
MCH: 30.1 pg (ref 27.0–33.0)
MCHC: 33.3 g/dL (ref 32.0–36.0)
MCV: 90.2 fL (ref 80.0–100.0)
MPV: 10.6 fL (ref 7.5–12.5)
Monocytes Relative: 6.6 %
Neutro Abs: 3705 cells/uL (ref 1500–7800)
Neutrophils Relative %: 62.8 %
Platelets: 303 10*3/uL (ref 140–400)
RBC: 3.79 10*6/uL — ABNORMAL LOW (ref 3.80–5.10)
RDW: 12.5 % (ref 11.0–15.0)
Total Lymphocyte: 27.7 %
WBC: 5.9 10*3/uL (ref 3.8–10.8)

## 2021-03-15 LAB — COMPLETE METABOLIC PANEL WITH GFR
AG Ratio: 1.3 (calc) (ref 1.0–2.5)
ALT: 15 U/L (ref 6–29)
AST: 15 U/L (ref 10–35)
Albumin: 4 g/dL (ref 3.6–5.1)
Alkaline phosphatase (APISO): 86 U/L (ref 37–153)
BUN/Creatinine Ratio: 16 (calc) (ref 6–22)
BUN: 33 mg/dL — ABNORMAL HIGH (ref 7–25)
CO2: 30 mmol/L (ref 20–32)
Calcium: 10.1 mg/dL (ref 8.6–10.4)
Chloride: 105 mmol/L (ref 98–110)
Creat: 2.11 mg/dL — ABNORMAL HIGH (ref 0.50–1.05)
GFR, Est African American: 30 mL/min/{1.73_m2} — ABNORMAL LOW (ref >=60)
GFR, Est Non African American: 26 mL/min/{1.73_m2} — ABNORMAL LOW (ref >=60)
Globulin: 3.1 g/dL (calc) (ref 1.9–3.7)
Glucose, Bld: 137 mg/dL — ABNORMAL HIGH (ref 65–99)
Potassium: 4.4 mmol/L (ref 3.5–5.3)
Sodium: 142 mmol/L (ref 135–146)
Total Bilirubin: 0.5 mg/dL (ref 0.2–1.2)
Total Protein: 7.1 g/dL (ref 6.1–8.1)

## 2021-03-15 LAB — LIPID PANEL
Cholesterol: 154 mg/dL (ref <200)
HDL: 47 mg/dL — ABNORMAL LOW (ref >=50)
LDL Cholesterol (Calc): 83 mg/dL (calc)
Non-HDL Cholesterol (Calc): 107 mg/dL (calc) (ref <130)
Total CHOL/HDL Ratio: 3.3 (calc) (ref <5.0)
Triglycerides: 137 mg/dL (ref <150)

## 2021-03-15 LAB — PARATHYROID HORMONE, INTACT (NO CA): PTH: 120 pg/mL — ABNORMAL HIGH (ref 16–77)

## 2021-03-15 LAB — VITAMIN D 25 HYDROXY (VIT D DEFICIENCY, FRACTURES): Vit D, 25-Hydroxy: 54 ng/mL (ref 30–100)

## 2021-03-15 LAB — PHOSPHORUS: Phosphorus: 4.4 mg/dL (ref 2.5–4.5)

## 2021-03-16 ENCOUNTER — Other Ambulatory Visit: Payer: Self-pay

## 2021-03-16 DIAGNOSIS — IMO0002 Reserved for concepts with insufficient information to code with codable children: Secondary | ICD-10-CM

## 2021-03-16 DIAGNOSIS — E1165 Type 2 diabetes mellitus with hyperglycemia: Secondary | ICD-10-CM

## 2021-03-16 MED ORDER — OZEMPIC (2 MG/DOSE) 8 MG/3ML ~~LOC~~ SOPN: 2.0000 mg | PEN_INJECTOR | SUBCUTANEOUS | 3 refills | Status: DC

## 2021-03-17 ENCOUNTER — Other Ambulatory Visit: Payer: Self-pay | Admitting: Family Medicine

## 2021-03-17 DIAGNOSIS — E1142 Type 2 diabetes mellitus with diabetic polyneuropathy: Secondary | ICD-10-CM

## 2021-03-17 MED ORDER — GABAPENTIN 300 MG PO CAPS: ORAL_CAPSULE | ORAL | 3 refills | Status: DC

## 2021-03-21 ENCOUNTER — Other Ambulatory Visit: Payer: Self-pay | Admitting: Family Medicine

## 2021-03-21 DIAGNOSIS — N951 Menopausal and female climacteric states: Secondary | ICD-10-CM

## 2021-04-18 ENCOUNTER — Other Ambulatory Visit: Payer: Self-pay | Admitting: Family Medicine

## 2021-04-18 DIAGNOSIS — I10 Essential (primary) hypertension: Secondary | ICD-10-CM

## 2021-04-20 ENCOUNTER — Encounter: Payer: Self-pay | Admitting: Family Medicine

## 2021-04-20 ENCOUNTER — Other Ambulatory Visit: Payer: Self-pay

## 2021-04-20 ENCOUNTER — Encounter: Payer: Self-pay | Admitting: Unknown Physician Specialty

## 2021-04-20 ENCOUNTER — Ambulatory Visit (INDEPENDENT_AMBULATORY_CARE_PROVIDER_SITE_OTHER): Payer: 59 | Admitting: Unknown Physician Specialty

## 2021-04-20 VITALS — BP 128/74 | HR 95 | Temp 98.4°F | Resp 16 | Ht 64.0 in | Wt 206.7 lb

## 2021-04-20 DIAGNOSIS — Z719 Counseling, unspecified: Secondary | ICD-10-CM | POA: Diagnosis not present

## 2021-04-20 DIAGNOSIS — E1165 Type 2 diabetes mellitus with hyperglycemia: Secondary | ICD-10-CM | POA: Diagnosis not present

## 2021-04-20 DIAGNOSIS — Z794 Long term (current) use of insulin: Secondary | ICD-10-CM | POA: Diagnosis not present

## 2021-04-20 DIAGNOSIS — E1142 Type 2 diabetes mellitus with diabetic polyneuropathy: Secondary | ICD-10-CM | POA: Diagnosis not present

## 2021-04-20 LAB — POCT GLYCOSYLATED HEMOGLOBIN (HGB A1C): Hemoglobin A1C: 7.5 % — AB (ref 4.0–5.6)

## 2021-04-20 NOTE — Progress Notes (Signed)
BP 128/74 (BP Location: Right Arm, Patient Position: Sitting, Cuff Size: Large)   Pulse 95   Temp 98.4 F (36.9 C) (Oral)   Resp 16   Ht 5\' 4"  (1.626 m)   Wt 206 lb 11.2 oz (93.8 kg)   LMP 05/29/2018   SpO2 95%   BMI 35.48 kg/m    Subjective:    Patient ID: Janice Jennings, female    DOB: 1971/05/07, 50 y.o.   MRN: [id number]  HPI: Janice Jennings is a 51 y.o. female  Chief Complaint  Patient presents with   Surgical Clearance   Pt is here for surgical clearance.  Planned surgery on left shoulder for "scar tissure."  I'm unable to see any notes from orthopedics at this time.  Had labs done in May showing Hgb A1C of 8.9.  This is above the threshhold of 7.5 from Orthopedics for surgery.  At the time, she was instructed to increase Ozempic.  Last GFR was 24 limiting options.    Recheck today, hgb A1C was 7.5.    Relevant past medical, surgical, family and social history reviewed and updated as indicated. Interim medical history since our last visit reviewed. Allergies and medications reviewed and updated.  Review of Systems  Per HPI unless specifically indicated above     Objective:    BP 128/74 (BP Location: Right Arm, Patient Position: Sitting, Cuff Size: Large)   Pulse 95   Temp 98.4 F (36.9 C) (Oral)   Resp 16   Ht 5\' 4"  (1.626 m)   Wt 206 lb 11.2 oz (93.8 kg)   LMP 05/29/2018   SpO2 95%   BMI 35.48 kg/m   Wt Readings from Last 3 Encounters:  04/20/21 206 lb 11.2 oz (93.8 kg)  03/14/21 205 lb 8 oz (93.2 kg)  10/27/20 203 lb 3.2 oz (92.2 kg)    Physical Exam Constitutional:      General: She is not in acute distress.    Appearance: Normal appearance. She is well-developed.  HENT:     Head: Normocephalic and atraumatic.  Eyes:     General: Lids are normal. No scleral icterus.       Right eye: No discharge.        Left eye: No discharge.     Conjunctiva/sclera: Conjunctivae normal.  Neck:     Vascular: No carotid bruit or JVD.  Cardiovascular:      Rate and Rhythm: Normal rate and regular rhythm.     Heart sounds: Normal heart sounds.  Pulmonary:     Effort: Pulmonary effort is normal.     Breath sounds: Normal breath sounds.  Abdominal:     Palpations: There is no hepatomegaly or splenomegaly.  Musculoskeletal:        General: Normal range of motion.     Cervical back: Normal range of motion and neck supple.  Skin:    General: Skin is warm and dry.     Coloration: Skin is not pale.     Findings: No rash.  Neurological:     Mental Status: She is alert and oriented to person, place, and time.  Psychiatric:        Behavior: Behavior normal.        Thought Content: Thought content normal.        Judgment: Judgment normal.    Results for orders placed or performed in visit on 04/20/21  POCT HgB A1C  Result Value Ref Range   Hemoglobin A1C 7.5 (A) 4.0 -  5.6 %   HbA1c POC (<> result, manual entry)     HbA1c, POC (prediabetic range)     HbA1c, POC (controlled diabetic range)        Assessment & Plan:   Problem List Items Addressed This Visit       Unprioritized   Type 2 diabetes mellitus with hyperglycemia, with long-term current use of insulin (HCC)    Hgb A1C is 7.5 today.  Down from 8.9.  OK for surgery.         Other Visit Diagnoses     DM type 2 with diabetic peripheral neuropathy ([place])    -  Primary   Relevant Orders   POCT HgB A1C (Completed)   Encounter for consultation       Surgery clearance form filled out.  Will fax to provider        Follow up plan: No follow-ups on file.

## 2021-04-20 NOTE — Assessment & Plan Note (Signed)
Hgb A1C is 7.5 today.  Down from 8.9.  OK for surgery.

## 2021-04-25 DIAGNOSIS — N184 Chronic kidney disease, stage 4 (severe): Secondary | ICD-10-CM | POA: Insufficient documentation

## 2021-05-17 ENCOUNTER — Other Ambulatory Visit: Payer: Self-pay | Admitting: Family Medicine

## 2021-05-17 DIAGNOSIS — E1169 Type 2 diabetes mellitus with other specified complication: Secondary | ICD-10-CM

## 2021-05-17 DIAGNOSIS — Z6835 Body mass index (BMI) 35.0-35.9, adult: Secondary | ICD-10-CM

## 2021-05-17 DIAGNOSIS — E785 Hyperlipidemia, unspecified: Secondary | ICD-10-CM

## 2021-06-08 ENCOUNTER — Other Ambulatory Visit: Payer: Self-pay | Admitting: Family Medicine

## 2021-06-08 DIAGNOSIS — E1169 Type 2 diabetes mellitus with other specified complication: Secondary | ICD-10-CM

## 2021-06-08 DIAGNOSIS — Z6835 Body mass index (BMI) 35.0-35.9, adult: Secondary | ICD-10-CM

## 2021-06-08 DIAGNOSIS — N951 Menopausal and female climacteric states: Secondary | ICD-10-CM

## 2021-06-15 ENCOUNTER — Ambulatory Visit (INDEPENDENT_AMBULATORY_CARE_PROVIDER_SITE_OTHER): Payer: 59 | Admitting: Family Medicine

## 2021-06-15 ENCOUNTER — Other Ambulatory Visit: Payer: Self-pay

## 2021-06-15 ENCOUNTER — Encounter: Payer: Self-pay | Admitting: Family Medicine

## 2021-06-15 ENCOUNTER — Other Ambulatory Visit (HOSPITAL_COMMUNITY)
Admission: RE | Admit: 2021-06-15 | Discharge: 2021-06-15 | Disposition: A | Discharge status: Home / Self Care | Payer: 59 | Source: Ambulatory Visit | Attending: Family Medicine | Admitting: Family Medicine

## 2021-06-15 VITALS — BP 138/80 | HR 94 | Temp 98.2°F | Resp 16 | Ht 64.0 in | Wt 204.4 lb

## 2021-06-15 DIAGNOSIS — E785 Hyperlipidemia, unspecified: Secondary | ICD-10-CM

## 2021-06-15 DIAGNOSIS — I10 Essential (primary) hypertension: Secondary | ICD-10-CM

## 2021-06-15 DIAGNOSIS — N898 Other specified noninflammatory disorders of vagina: Secondary | ICD-10-CM | POA: Diagnosis present

## 2021-06-15 DIAGNOSIS — M25532 Pain in left wrist: Secondary | ICD-10-CM

## 2021-06-15 DIAGNOSIS — IMO0002 Reserved for concepts with insufficient information to code with codable children: Secondary | ICD-10-CM

## 2021-06-15 DIAGNOSIS — N2581 Secondary hyperparathyroidism of renal origin: Secondary | ICD-10-CM

## 2021-06-15 DIAGNOSIS — E1169 Type 2 diabetes mellitus with other specified complication: Secondary | ICD-10-CM | POA: Diagnosis not present

## 2021-06-15 DIAGNOSIS — E1165 Type 2 diabetes mellitus with hyperglycemia: Secondary | ICD-10-CM

## 2021-06-15 DIAGNOSIS — Z794 Long term (current) use of insulin: Secondary | ICD-10-CM

## 2021-06-15 DIAGNOSIS — N1832 Chronic kidney disease, stage 3b: Secondary | ICD-10-CM

## 2021-06-15 DIAGNOSIS — D631 Anemia in chronic kidney disease: Secondary | ICD-10-CM

## 2021-06-15 DIAGNOSIS — J4520 Mild intermittent asthma, uncomplicated: Secondary | ICD-10-CM

## 2021-06-15 DIAGNOSIS — Z6835 Body mass index (BMI) 35.0-35.9, adult: Secondary | ICD-10-CM

## 2021-06-15 DIAGNOSIS — Z5181 Encounter for therapeutic drug level monitoring: Secondary | ICD-10-CM

## 2021-06-15 DIAGNOSIS — M25531 Pain in right wrist: Secondary | ICD-10-CM

## 2021-06-15 MED ORDER — HYDRALAZINE HCL 100 MG PO TABS: 100.0000 mg | ORAL_TABLET | Freq: Three times a day (TID) | ORAL | 3 refills | Status: DC

## 2021-06-15 MED ORDER — AMLODIPINE BESYLATE 10 MG PO TABS: 10.0000 mg | ORAL_TABLET | Freq: Every day | ORAL | 3 refills | Status: DC

## 2021-06-15 NOTE — Progress Notes (Signed)
Name: Janice Jennings   MRN: [id number]    DOB: 04-Jul-1971   Date:06/15/2021       Progress Note  Chief Complaint  Patient presents with   Diabetes   Hyperlipidemia   Hypertension     Subjective:   Janice Jennings is a 50 y.o. female, presents to clinic for routine f/up  Left shoulder surgery July1 bone spur   IDDM  DM:   Reports good med compliance  lantus 72-74 and ozempic Pt has no SE from meds. 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   Blood pressure today is elevated, but improved when rechecked prior to leaving clinic today BP Readings from Last 3 Encounters:  06/15/21 138/80  04/20/21 128/74  03/14/21 130/80   Pt denies CP, SOB, exertional sx, LE edema, palpitation, Ha's, visual disturbances, lightheadedness, hypotension, syncope.   Hyperlipidemia: Currently treated with lipitor and zetia pt reports good med compliance Last Lipids: Lab Results  Component Value Date   CHOL 154 03/14/2021   HDL 47 (L) 03/14/2021   LDLCALC 83 03/14/2021   TRIG 137 03/14/2021   CHOLHDL 3.3 03/14/2021   - Denies: Chest pain, shortness of breath, myalgias, claudication  Various areas of pain wrists feet swelling, stiffness Vaginal odor - wants swab to check for BV   On effexor 75 mg daily mood and anxiety good Depression screen [facility]  2/9 06/15/2021 04/20/2021 03/14/2021  Decreased Interest 0 0 0  Down, Depressed, Hopeless 0 0 0  PHQ - 2 Score 0 0 0  Altered sleeping 0 0 0  Tired, decreased energy 0 0 0  Change in appetite 0 0 0  Feeling bad or failure about yourself  0 0 0  Trouble concentrating 0 0 0  Moving slowly or fidgety/restless 0 0 0  Suicidal thoughts 0 0 0  PHQ-9 Score 0 0 0  Difficult doing work/chores Not difficult at all - Not difficult at all  Some recent data might be hidden       Current Outpatient Medications:    albuterol (VENTOLIN HFA) 108 (90 Base) MCG/ACT inhaler, Inhale 2 puffs into the lungs every 4 (four) hours as needed for wheezing or shortness of breath., Disp: 1 each, Rfl: 1   amLODipine (NORVASC) 10 MG tablet, Take 1 tablet (10 mg total) by mouth daily., Disp: 90 tablet, Rfl: 3   atorvastatin (LIPITOR) 80 MG tablet, Take 1 tablet (80 mg total) by mouth at bedtime., Disp: 90 tablet, Rfl: 3   carvedilol (COREG) 6.25 MG tablet, TAKE 1 TABLET BY MOUTH  TWICE DAILY WITH A MEAL, Disp: 180 tablet, Rfl: 3   Continuous Blood Gluc Sensor (FREESTYLE LIBRE 2 SENSOR) MISC, Place on arm, Disp: 2 each, Rfl: 0   cyclobenzaprine (FLEXERIL) 5 MG tablet, Take 1 tablet (5 mg total) by mouth 3 (three) times daily  as needed for muscle spasms., Disp: 30 tablet, Rfl: 1   ezetimibe (ZETIA) 10 MG tablet, TAKE 1 TABLET BY MOUTH  DAILY, Disp: 90 tablet, Rfl: 3   ferrous sulfate 325 (65 FE) MG tablet, Take 325 mg by mouth daily with breakfast., Disp: , Rfl:    gabapentin (NEURONTIN) 300 MG capsule, TAKE 1 CAPSULE BY MOUTH IN  THE MORNING AND 1 CAPSULE  BY MOUTH IN THE AFTERNOON  TAKE 2 CAPSULES BY MOUTH  BEFORE BEDTIME, Disp: 360 capsule, Rfl: 3   hydrALAZINE (APRESOLINE) 100 MG tablet, Take 1 tablet (100 mg total) by mouth 3 (three) times daily., Disp: 270 tablet, Rfl: 1   insulin glargine (LANTUS SOLOSTAR) 100 UNIT/ML Solostar Pen, INJECT SUBCUTANEOUSLY 60 TO 80 UNITS DAILY, Disp: 75 mL, Rfl: 0   Insulin Pen Needle  29G X 8MM MISC, Use as directed with insulin daily, Disp: 100 each, Rfl: 1   losartan (COZAAR) 100 MG tablet, Take 100 mg by mouth daily., Disp: , Rfl:    Multiple Vitamin (MULTIVITAMIN) tablet, Take 1 tablet by mouth daily., Disp: , Rfl:    PENNSAID 2 % SOLN, SMARTSIG:1 Pump Topical Twice Daily PRN, Disp: , Rfl:    Semaglutide, 2 MG/DOSE, (OZEMPIC, 2 MG/DOSE,) 8 MG/3ML SOPN, Inject 2 mg into the skin once a week., Disp: 3 mL, Rfl: 3   venlafaxine XR (EFFEXOR-XR) 75 MG 24 hr capsule, TAKE 1 CAPSULE BY MOUTH  DAILY WITH BREAKFAST, Disp: 90 capsule, Rfl: 3  Patient Active Problem List   Diagnosis Date Noted   Secondary hyperparathyroidism of renal origin ([place]) 10/27/2020   Mild intermittent asthma without complication 32/20/2542   Hot flashes due to menopause 07/08/2020   Osteoarthritis of knee 07/08/2020   Pain in joint of left shoulder 07/08/2020   Lumbar radiculopathy 11/10/2019   Type 2 diabetes mellitus with hyperglycemia, with long-term current use of insulin ([place]) 09/22/2019   Insulin dependent type 2 diabetes mellitus, uncontrolled ([place]) 09/22/2019   Anemia in chronic kidney disease 08/31/2019   Benign hypertensive kidney disease with chronic kidney disease 08/31/2019   Proteinuria 08/31/2019   Type 2 diabetes mellitus with diabetic chronic kidney disease ([place]) 08/31/2019   Hyperlipidemia associated with type 2 diabetes mellitus ([place]) 08/10/2019   Stage 3b chronic kidney disease ([place]) 08/10/2019   Hemoptysis 11/15/2018   Asthma    Class 2 severe obesity with serious comorbidity and body mass index (BMI) of 35.0 to 35.9 in adult ([place]) 09/22/2018   Anemia 06/19/2018   Diabetic retinopathy of both eyes associated with type 2 diabetes mellitus ([place]) 04/11/2018   Essential hypertension 03/07/2015    Past Surgical History:  Procedure Laterality Date   BREAST REDUCTION SURGERY  2001   CESAREAN SECTION     COLONOSCOPY WITH PROPOFOL N/A 08/15/2018   Procedure: COLONOSCOPY WITH  PROPOFOL;  Surgeon: Jonathon Bellows, MD;  Location: [facility] ENDOSCOPY;  Service: Gastroenterology;  Laterality: N/A;   ESOPHAGOGASTRODUODENOSCOPY (EGD) WITH PROPOFOL N/A 08/15/2018   Procedure: ESOPHAGOGASTRODUODENOSCOPY (EGD) WITH PROPOFOL;  Surgeon: Jonathon Bellows, MD;  Location: [facility] ENDOSCOPY;  Service: Gastroenterology;  Laterality: N/A;   GIVENS CAPSULE STUDY N/A 09/08/2018   Procedure: GIVENS CAPSULE STUDY;  Surgeon: Jonathon Bellows, MD;  Location: [facility] ENDOSCOPY;  Service: Gastroenterology;  Laterality: N/A;   REDUCTION MAMMAPLASTY Bilateral 2000   TUBAL LIGATION      Family History  Problem Relation Age of Onset   Diabetes Father    Hyperlipidemia Father    Hypertension Father    Cancer Maternal Aunt 50  leukemia - died   Diabetes Maternal Grandmother    Cancer Maternal Grandmother        breast cancer   Stroke Maternal Grandmother    Breast cancer Maternal Grandmother    Diabetes Daughter        Pre-diabetes   Diabetes Maternal Grandfather    Hyperlipidemia Maternal Grandfather    Hypertension Maternal Grandfather    Cancer Maternal Aunt 50       breast cancer - died   Ovarian cancer Neg Hx    Colon cancer Neg Hx     Social History   Tobacco Use   Smoking status: Never   Smokeless tobacco: Never  Vaping Use   Vaping Use: Never used  Substance Use Topics   Alcohol use: Yes    Alcohol/week: 0.0 standard drinks    Comment: occ   Drug use: No     No Known Allergies  Health Maintenance  Topic Date Due   PAP SMEAR-Modifier  03/04/2021   INFLUENZA VACCINE  05/29/2021   MAMMOGRAM  09/14/2021   HEMOGLOBIN A1C  10/20/2021   FOOT EXAM  10/27/2021   OPHTHALMOLOGY EXAM  01/18/2022   TETANUS/TDAP  05/26/2024   COLONOSCOPY (Pts 45-57yrs Insurance coverage will need to be confirmed)  08/15/2028   PNEUMOCOCCAL POLYSACCHARIDE VACCINE AGE 33-64 HIGH RISK  Completed   COVID-19 Vaccine  Completed   Hepatitis C Screening  Completed   HIV Screening  Completed   Zoster Vaccines-  Shingrix  Completed   Pneumococcal Vaccine 4-47 Years old  Aged Out   HPV VACCINES  Aged Out    Chart Review Today: I personally reviewed active problem list, medication list, allergies, family history, social history, health maintenance, notes from last encounter, lab results, imaging with the patient/caregiver today.   Review of Systems  Constitutional: Negative.   HENT: Negative.    Eyes: Negative.   Respiratory: Negative.    Cardiovascular: Negative.   Gastrointestinal: Negative.   Endocrine: Negative.   Genitourinary: Negative.   Musculoskeletal: Negative.   Skin: Negative.   Allergic/Immunologic: Negative.   Neurological: Negative.   Hematological: Negative.   Psychiatric/Behavioral: Negative.    All other systems reviewed and are negative.   Objective:   Vitals:   06/15/21 0855 06/15/21 0943  BP: (!) 142/80 138/80  Pulse: 94   Resp: 16   Temp: 98.2 F (36.8 C)   SpO2: 98%   Weight: 204 lb 6.4 oz (92.7 kg)   Height: 5\' 4"  (1.626 m)     Body mass index is 35.09 kg/m.  Physical Exam Vitals and nursing note reviewed.  Constitutional:      General: She is not in acute distress.    Appearance: Normal appearance. She is well-developed. She is obese. She is not ill-appearing, toxic-appearing or diaphoretic.     Interventions: Face mask in place.  HENT:     Head: Normocephalic and atraumatic.     Right Ear: External ear normal.     Left Ear: External ear normal.  Eyes:     General: Lids are normal. No scleral icterus.       Right eye: No discharge.        Left eye: No discharge.     Conjunctiva/sclera: Conjunctivae normal.  Neck:     Trachea: Phonation normal. No tracheal deviation.  Cardiovascular:     Rate and Rhythm: Normal rate and regular rhythm.     Pulses: Normal pulses.          Radial  pulses are 2+ on the right side and 2+ on the left side.       Posterior tibial pulses are 2+ on the right side and 2+ on the left side.     Heart sounds: Normal  heart sounds. No murmur heard.   No friction rub. No gallop.  Pulmonary:     Effort: Pulmonary effort is normal. No respiratory distress.     Breath sounds: Normal breath sounds. No stridor. No wheezing, rhonchi or rales.  Chest:     Chest wall: No tenderness.  Abdominal:     General: Bowel sounds are normal. There is no distension.     Palpations: Abdomen is soft.  Musculoskeletal:     Right lower leg: No edema.     Left lower leg: No edema.  Skin:    General: Skin is warm and dry.     Coloration: Skin is not jaundiced or pale.     Findings: No rash.  Neurological:     Mental Status: She is alert.     Motor: No abnormal muscle tone.     Gait: Gait normal.  Psychiatric:        Mood and Affect: Mood normal.        Speech: Speech normal.        Behavior: Behavior normal.        Assessment & Plan:     ICD-10-CM   1. Insulin dependent type 2 diabetes mellitus, uncontrolled (HCC)  X93.71 COMPLETE METABOLIC PANEL WITH GFR   Z79.4    uncontrolled, titrate lantus to morning fasting blood sugar 100 to less than 150, work on diet, close f/up, freestyle libre 2 meter sample given    2. Essential hypertension  I10 amLODipine (NORVASC) 10 MG tablet    hydrALAZINE (APRESOLINE) 100 MG tablet    COMPLETE METABOLIC PANEL WITH GFR   see below - med changed with nephro    3. Hyperlipidemia associated with type 2 diabetes mellitus ([place])  E11.69    E78.5    good statin compliance, due for labs, no SE or concerns    4. Class 2 severe obesity with serious comorbidity and body mass index (BMI) of 35.0 to 35.9 in adult, unspecified obesity type ([place])  E66.01    Z68.35     5. Mild intermittent asthma without complication  I96.78    currently well controlled    6. Essential hypertension  I10 amLODipine (NORVASC) 10 MG tablet    hydrALAZINE (APRESOLINE) 100 MG tablet    COMPLETE METABOLIC PANEL WITH GFR   meds refilled, personally reviewed labs through care everywhere and last Nephrology  OV with Dr. Juleen China, BP at goal today    7. Pain of both wrist joints  L38.101 COMPLETE METABOLIC PANEL WITH GFR   M25.532 TSH    Sedimentation rate    ANA    C-reactive protein    Rheumatoid factor   multiple areas of pain, swelling, stiffness - add inflammatory/rheumatic screening, she frequently has various complaints - no past rheum consult    8. Vaginal odor  N89.8 Cervicovaginal ancillary only   swab and tx results    9. Encounter for medication monitoring  B51.02 COMPLETE METABOLIC PANEL WITH GFR    10. Secondary hyperparathyroidism of renal origin ([place])  N25.81    per nephro    11. Anemia in stage 3b chronic kidney disease ([place])  N18.32    D63.1    per nephro - worsening renal function, taken off HCTZ,  monitoring CBC/H/H      Return for CPE.   Delsa Grana, PA-C 06/15/21 9:18 AM

## 2021-06-16 LAB — CERVICOVAGINAL ANCILLARY ONLY
Bacterial Vaginitis (gardnerella): NEGATIVE
Candida Glabrata: NEGATIVE
Candida Vaginitis: NEGATIVE
Chlamydia: NEGATIVE
Comment: NEGATIVE
Comment: NEGATIVE
Comment: NEGATIVE
Comment: NEGATIVE
Comment: NEGATIVE
Comment: NORMAL
Neisseria Gonorrhea: NEGATIVE
Trichomonas: NEGATIVE

## 2021-06-19 LAB — COMPLETE METABOLIC PANEL WITH GFR
AG Ratio: 1.3 (calc) (ref 1.0–2.5)
ALT: 22 U/L (ref 6–29)
AST: 16 U/L (ref 10–35)
Albumin: 4 g/dL (ref 3.6–5.1)
Alkaline phosphatase (APISO): 91 U/L (ref 37–153)
BUN/Creatinine Ratio: 14 (calc) (ref 6–22)
BUN: 31 mg/dL — ABNORMAL HIGH (ref 7–25)
CO2: 28 mmol/L (ref 20–32)
Calcium: 9.8 mg/dL (ref 8.6–10.4)
Chloride: 109 mmol/L (ref 98–110)
Creat: 2.18 mg/dL — ABNORMAL HIGH (ref 0.50–1.03)
Globulin: 3.1 g/dL (calc) (ref 1.9–3.7)
Glucose, Bld: 127 mg/dL — ABNORMAL HIGH (ref 65–99)
Potassium: 4.7 mmol/L (ref 3.5–5.3)
Sodium: 143 mmol/L (ref 135–146)
Total Bilirubin: 0.4 mg/dL (ref 0.2–1.2)
Total Protein: 7.1 g/dL (ref 6.1–8.1)
eGFR: 27 mL/min/{1.73_m2} — ABNORMAL LOW (ref >=60)

## 2021-06-19 LAB — RHEUMATOID FACTOR: Rheumatoid fact SerPl-aCnc: 31 IU/mL — ABNORMAL HIGH (ref <14)

## 2021-06-19 LAB — C-REACTIVE PROTEIN: CRP: 1.4 mg/L (ref <8.0)

## 2021-06-19 LAB — ANA: Anti Nuclear Antibody (ANA): NEGATIVE

## 2021-06-19 LAB — TSH: TSH: 3.24 mIU/L

## 2021-06-19 LAB — SEDIMENTATION RATE: Sed Rate: 43 mm/h — ABNORMAL HIGH (ref 0–30)

## 2021-06-22 ENCOUNTER — Other Ambulatory Visit: Payer: Self-pay

## 2021-06-22 DIAGNOSIS — M2550 Pain in unspecified joint: Secondary | ICD-10-CM

## 2021-06-22 DIAGNOSIS — R700 Elevated erythrocyte sedimentation rate: Secondary | ICD-10-CM

## 2021-06-22 DIAGNOSIS — M0580 Other rheumatoid arthritis with rheumatoid factor of unspecified site: Secondary | ICD-10-CM

## 2021-06-25 ENCOUNTER — Other Ambulatory Visit: Payer: Self-pay | Admitting: Family Medicine

## 2021-06-25 DIAGNOSIS — E1165 Type 2 diabetes mellitus with hyperglycemia: Secondary | ICD-10-CM

## 2021-06-25 DIAGNOSIS — IMO0002 Reserved for concepts with insufficient information to code with codable children: Secondary | ICD-10-CM

## 2021-08-01 ENCOUNTER — Ambulatory Visit (INDEPENDENT_AMBULATORY_CARE_PROVIDER_SITE_OTHER): Payer: 59 | Admitting: Internal Medicine

## 2021-08-01 ENCOUNTER — Encounter: Payer: Self-pay | Admitting: Internal Medicine

## 2021-08-01 ENCOUNTER — Ambulatory Visit: Payer: Self-pay

## 2021-08-01 ENCOUNTER — Other Ambulatory Visit: Payer: Self-pay

## 2021-08-01 VITALS — BP 139/81 | HR 82 | Resp 16 | Ht 65.0 in | Wt 208.4 lb

## 2021-08-01 DIAGNOSIS — M25532 Pain in left wrist: Secondary | ICD-10-CM | POA: Diagnosis not present

## 2021-08-01 DIAGNOSIS — M25531 Pain in right wrist: Secondary | ICD-10-CM | POA: Diagnosis not present

## 2021-08-01 DIAGNOSIS — R768 Other specified abnormal immunological findings in serum: Secondary | ICD-10-CM

## 2021-08-01 DIAGNOSIS — R700 Elevated erythrocyte sedimentation rate: Secondary | ICD-10-CM | POA: Diagnosis not present

## 2021-08-01 DIAGNOSIS — M25512 Pain in left shoulder: Secondary | ICD-10-CM | POA: Diagnosis not present

## 2021-08-01 NOTE — Progress Notes (Signed)
Office Visit Note  Patient: Janice Jennings             Date of Birth: 27-Feb-1971           MRN: [id number]             PCP: Delsa Grana, PA-C Referring: Delsa Grana, PA-C Visit Date: 08/01/2021  Subjective:  New Patient (Initial Visit) (Inflammation, neck pain)   History of Present Illness: Janice Jennings is a 50 y.o. female here for joint pain and swelling in bilateral wrists with elevated sedimentation rate also positive ANA and rheumatoid factor. She has wrist pain and increase in left-sided neck pain ongoing since mid August approximately 6 weeks ago.  Pain is sometimes localized to the wrist but also sometimes extends up the arm.  She feels sensitivity to pressure and use over both the joints and extending over her muscle areas intermittently.  She has had carpal tunnel symptoms in the past but not currently and does not feel symptoms are similar.  Generally sensitivity is also extended to her forearms elbows knees and thighs on both sides.  She does not notice any visible discoloration or swelling in the affected areas.  She is not able to take oral NSAIDs due to her chronic renal insufficiency.  Topical Pennsaid on the neck and shoulder is mildly beneficial temporarily.  She has had recent left shoulder arthroscopy for bone spur removal due to lack of improvement after 2 trials of steroid joint injection and a course of physical therapy started since late last year.  She does not recall similar symptoms to these in the past.   Labs reviewed 05/2021 ANA neg RF 31 ESR 43 CRP 1.4 TSH 3.24 CMP eGFR 27  Activities of Daily Living:  Patient reports morning stiffness for 1 hour.   Patient Denies nocturnal pain.  Difficulty dressing/grooming: Denies Difficulty climbing stairs: Reports Difficulty getting out of chair: Denies Difficulty using hands for taps, buttons, cutlery, and/or writing: Denies  Review of Systems  Constitutional:  Negative for fatigue.  HENT:  Negative for  mouth dryness.   Eyes:  Positive for dryness.  Respiratory:  Negative for shortness of breath.   Cardiovascular:  Negative for swelling in legs/feet.  Gastrointestinal:  Negative for constipation.  Endocrine: Positive for heat intolerance.  Genitourinary:  Negative for difficulty urinating.  Musculoskeletal:  Positive for joint pain, joint pain, muscle weakness, morning stiffness and muscle tenderness.  Skin:  Negative for rash.  Allergic/Immunologic: Negative for susceptible to infections.  Neurological:  Positive for numbness.  Hematological:  Negative for bruising/bleeding tendency.  Psychiatric/Behavioral:  Negative for sleep disturbance.    PMFS History:  Patient Active Problem List   Diagnosis Date Noted   Elevated sedimentation rate 08/01/2021   Rheumatoid factor positive 08/01/2021   Bilateral wrist pain 08/01/2021   Secondary hyperparathyroidism of renal origin ([place]) 10/27/2020   Mild intermittent asthma without complication 01/65/5374   Hot flashes due to menopause 07/08/2020   Osteoarthritis of knee 07/08/2020   Pain in joint of left shoulder 07/08/2020   Lumbar radiculopathy 11/10/2019   Type 2 diabetes mellitus with hyperglycemia, with long-term current use of insulin ([place]) 09/22/2019   Insulin dependent type 2 diabetes mellitus, uncontrolled 09/22/2019   Anemia in chronic kidney disease 08/31/2019   Benign hypertensive kidney disease with chronic kidney disease 08/31/2019   Proteinuria 08/31/2019   Type 2 diabetes mellitus with diabetic chronic kidney disease ([place]) 08/31/2019   Hyperlipidemia associated with type 2 diabetes mellitus ([place]) 08/10/2019  Stage 3b chronic kidney disease ([place]) 08/10/2019   Hemoptysis 11/15/2018   Asthma    Class 2 severe obesity with serious comorbidity and body mass index (BMI) of 35.0 to 35.9 in adult [facility]) 09/22/2018   Anemia 06/19/2018   Diabetic retinopathy of both eyes associated with type 2 diabetes mellitus ([place]) 04/11/2018    Essential hypertension 03/07/2015    Past Medical History:  Diagnosis Date   Asthma    Chronic kidney disease    Diabetes mellitus without complication (HCC)    Diabetic retinopathy of both eyes associated with type 2 diabetes mellitus ([place]) 04/11/2018   Moderate to severe, non-proliferative DR; referred to retinal specialist; April 10, 2018   Hyperlipidemia    Hypertension    Migraine    Excedrin Tension HA OTC    Family History  Problem Relation Age of Onset   Diabetes Father    Hyperlipidemia Father    Hypertension Father    Cancer Maternal Aunt 89       leukemia - died   Diabetes Maternal Grandmother    Cancer Maternal Grandmother        breast cancer   Stroke Maternal Grandmother    Breast cancer Maternal Grandmother    Diabetes Daughter        Pre-diabetes   Diabetes Maternal Grandfather    Hyperlipidemia Maternal Grandfather    Hypertension Maternal Grandfather    Cancer Maternal Aunt 50       breast cancer - died   Ovarian cancer Neg Hx    Colon cancer Neg Hx    Past Surgical History:  Procedure Laterality Date   BREAST REDUCTION SURGERY  2001   CESAREAN SECTION     COLONOSCOPY WITH PROPOFOL N/A 08/15/2018   Procedure: COLONOSCOPY WITH PROPOFOL;  Surgeon: Jonathon Bellows, MD;  Location[facility] ENDOSCOPY;  Service: Gastroenterology;  Laterality: N/A;   ESOPHAGOGASTRODUODENOSCOPY (EGD) WITH PROPOFOL N/A 08/15/2018   Procedure: ESOPHAGOGASTRODUODENOSCOPY (EGD) WITH PROPOFOL;  Surgeon: Jonathon Bellows, MD;  Location: [facility] ENDOSCOPY;  Service: Gastroenterology;  Laterality: N/A;   GIVENS CAPSULE STUDY N/A 09/08/2018   Procedure: GIVENS CAPSULE STUDY;  Surgeon: Jonathon Bellows, MD;  Location: [facility] ENDOSCOPY;  Service: Gastroenterology;  Laterality: N/A;   REDUCTION MAMMAPLASTY Bilateral 2000   SHOULDER SURGERY Left    TUBAL LIGATION     Social History   Social History Narrative   Janice Jennings grew up in [place], [place] and also in [place]. She attending the [employer] and obtained her Bachelors in Psychology. She then attended [employer] and obtained her Masters in Psychology. 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 She lives at home with her two children, Darius age 61 and [place] age 38. They have a dog named Coco who she describes as having separation anxiety. Ralonda enjoys activities with the children. They enjoy outdoor activities, watching movies.     Immunization History  Administered Date(s) Administered   Influenza Inj Mdck Quad Pf 08/10/2019   Influenza,inj,Quad PF,6+ Mos 10/26/2013, 07/24/2014, 07/19/2015, 11/23/2016, 06/19/2018, 09/28/2020   Influenza-Unspecified 09/06/2017   PFIZER Comirnaty(Gray Top)Covid-19 Tri-Sucrose Vaccine 03/06/2021, 03/06/2021   PFIZER(Purple Top)SARS-COV-2 Vaccination 11/20/2019, 12/11/2019, 08/23/2020   Pneumococcal Conjugate-13 07/19/2015   Pneumococcal Polysaccharide-23 05/26/2014   Td 05/26/2014   Zoster Recombinat (Shingrix) 07/07/2020, 09/28/2020     Objective: Vital Signs: BP 139/81 (BP Location: Right Arm, Patient Position: Sitting, Cuff Size: Large)   Pulse 82   Resp 16   Ht '5\' 5"'  (1.651 m)  Wt 208 lb 6.4 oz (94.5 kg)   LMP 05/29/2018   BMI 34.68 kg/m    Physical Exam Constitutional:      Appearance: She is obese.  HENT:     Right Ear: External ear normal.     Left Ear: External ear normal.     Mouth/Throat:     Mouth: Mucous membranes are moist.     Pharynx: Oropharynx is clear.  Eyes:     Conjunctiva/sclera: Conjunctivae normal.  Cardiovascular:     Rate and Rhythm: Normal rate and regular rhythm.  Pulmonary:     Effort: Pulmonary effort is normal.     Breath sounds: Normal breath sounds.  Skin:    General: Skin is warm and dry.     Findings: No rash.  Neurological:     Mental Status: She is alert.     Deep Tendon Reflexes: Reflexes normal.  Psychiatric:        Mood and Affect: Mood normal.     Musculoskeletal Exam:  Neck full ROM  tenderness on left side worse with leftward rotation Left shoulder decreased abduction ROM with guarding, normal flexion extension and internal and external rotation Upper back tenderness to palpation along medial border of left scapula, along left side of neck, upper trapezius, no palpable bundles or nodules, no right side tenderness Elbows full ROM no tenderness or swelling Wrists full ROM no tenderness or swelling Fingers full ROM no tenderness or swelling Knees full ROM no tenderness or swelling   Investigation: No additional findings.  Imaging: XR Hand 2 View Left  Result Date: 08/02/2021 X-ray left hand 2 views Radiocarpal carpal joint space appears normal.  MCP joint spaces appear normal.  Interphalangeal joints with small lateral osteophytes early enthesophytes most notable at the second and third digits.  Some lateral third DIP deviation.  Bone mineralization appears normal. Impression Mild osteoarthritis changes of interphalangeal joints, most visible at second third digit  XR Hand 2 View Right  Result Date: 08/02/2021 X-ray right hand 2 views Radiocarpal carpal joint space appears normal.  MCP joint spaces appear normal.  Very early marginal osteophyte or enthesophytes of interphalangeal joints most visible at second PIP.  Some third DIP lateral deviation without significant joint space loss. Bone mineralization appears normal. Impression Mild osteoarthritis changes of second digit interphalangeal joints   Recent Labs: Lab Results  Component Value Date   WBC 5.9 03/14/2021   HGB 11.4 (L) 03/14/2021   PLT 303 03/14/2021   NA 143 06/15/2021   K 4.7 06/15/2021   CL 109 06/15/2021   CO2 28 06/15/2021   GLUCOSE 127 (H) 06/15/2021   BUN 31 (H) 06/15/2021   CREATININE 2.18 (H) 06/15/2021   BILITOT 0.4 06/15/2021   ALKPHOS 64 11/14/2018   AST 16 06/15/2021   ALT 22 06/15/2021   PROT 7.1 06/15/2021   ALBUMIN 3.6 11/14/2018   CALCIUM 9.8 06/15/2021   GFRAA 30 (L) 03/14/2021     Speciality Comments: No specialty comments available.  Procedures:  No procedures performed Allergies: Hydrocodone   Assessment / Plan:     Visit Diagnoses: Bilateral wrist pain - Plan: XR Hand 2 View Right, XR Hand 2 View Left  Initial problem with left wrist pain and sometimes bilateral wrist pain but currently doing well today.  No focal changes appreciated on exam.  X-ray of bilateral hands shows very early osteoarthritis of the interphalangeal joints no significant wrist disease.  Pain in joint of left shoulder  Left neck and shoulder  pain looks most consistent for a muscular problem.  Could be related to overuse or abnormal use with the preceding chronic shoulder pain and recent shoulder surgery, physical therapy did not resolve this much but that was before the shoulder osteophyte was managed.  Elevated sedimentation rate Rheumatoid factor positive - Plan: Cyclic citrul peptide antibody, IgG, ESR  Multiple mildly positive inflammatory markers we will recheck these today due to lack of clinically apparent inflammation on exam.  We will also check CCP antibody considering the low positive rheumatoid factor.  Orders: Orders Placed This Encounter  Procedures   XR Hand 2 View Right   XR Hand 2 View Left   Sedimentation rate   Cyclic citrul peptide antibody, IgG    No orders of the defined types were placed in this encounter.   Follow-Up Instructions: Return in about 2 weeks (around 08/15/2021) for New pt f/u 2wks.   Collier Salina, MD  Note - This record has been created using [employer].  Chart creation errors have been sought, but may not always  have been located. Such creation errors do not reflect on  the standard of medical care.

## 2021-08-02 ENCOUNTER — Encounter: Payer: Self-pay | Admitting: Family Medicine

## 2021-08-02 LAB — SEDIMENTATION RATE: Sed Rate: 25 mm/h (ref 0–30)

## 2021-08-02 LAB — CYCLIC CITRUL PEPTIDE ANTIBODY, IGG: Cyclic Citrullin Peptide Ab: <16 UNITS

## 2021-08-07 ENCOUNTER — Other Ambulatory Visit: Payer: Self-pay

## 2021-08-07 DIAGNOSIS — E1100 Type 2 diabetes mellitus with hyperosmolarity without nonketotic hyperglycemic-hyperosmolar coma (NKHHC): Secondary | ICD-10-CM

## 2021-08-07 MED ORDER — FREESTYLE LIBRE 2 SENSOR MISC: 0 refills | Status: DC

## 2021-08-08 ENCOUNTER — Other Ambulatory Visit: Payer: Self-pay

## 2021-08-08 DIAGNOSIS — E1165 Type 2 diabetes mellitus with hyperglycemia: Secondary | ICD-10-CM

## 2021-08-08 DIAGNOSIS — E1100 Type 2 diabetes mellitus with hyperosmolarity without nonketotic hyperglycemic-hyperosmolar coma (NKHHC): Secondary | ICD-10-CM

## 2021-08-08 MED ORDER — FREESTYLE LIBRE 2 SENSOR MISC: 0 refills | Status: DC

## 2021-08-08 NOTE — Telephone Encounter (Signed)
Spoke to pt over the phone.

## 2021-08-08 NOTE — Telephone Encounter (Signed)
Yes in the orders you have to change sample (or OTC or No print) to normal to send electronically to the pharmacy - please try again and get me if its not working

## 2021-08-09 ENCOUNTER — Other Ambulatory Visit: Payer: Self-pay

## 2021-08-09 DIAGNOSIS — E1100 Type 2 diabetes mellitus with hyperosmolarity without nonketotic hyperglycemic-hyperosmolar coma (NKHHC): Secondary | ICD-10-CM

## 2021-08-09 DIAGNOSIS — E1165 Type 2 diabetes mellitus with hyperglycemia: Secondary | ICD-10-CM

## 2021-08-09 MED ORDER — FREESTYLE LIBRE 2 SENSOR MISC: 0 refills | Status: DC

## 2021-08-16 ENCOUNTER — Ambulatory Visit: Payer: 59 | Admitting: Internal Medicine

## 2021-08-21 ENCOUNTER — Encounter: Payer: 59 | Admitting: Family Medicine

## 2021-08-23 ENCOUNTER — Other Ambulatory Visit: Payer: Self-pay

## 2021-08-23 ENCOUNTER — Ambulatory Visit (INDEPENDENT_AMBULATORY_CARE_PROVIDER_SITE_OTHER): Payer: 59 | Admitting: Nurse Practitioner

## 2021-08-23 ENCOUNTER — Other Ambulatory Visit (HOSPITAL_COMMUNITY)
Admission: RE | Admit: 2021-08-23 | Discharge: 2021-08-23 | Disposition: A | Discharge status: Home / Self Care | Payer: 59 | Source: Ambulatory Visit | Attending: Family Medicine | Admitting: Family Medicine

## 2021-08-23 ENCOUNTER — Encounter: Payer: Self-pay | Admitting: Nurse Practitioner

## 2021-08-23 VITALS — BP 128/82 | HR 82 | Temp 98.2°F | Resp 16 | Ht 65.0 in | Wt 206.9 lb

## 2021-08-23 DIAGNOSIS — E1142 Type 2 diabetes mellitus with diabetic polyneuropathy: Secondary | ICD-10-CM

## 2021-08-23 DIAGNOSIS — N1832 Chronic kidney disease, stage 3b: Secondary | ICD-10-CM

## 2021-08-23 DIAGNOSIS — Z0000 Encounter for general adult medical examination without abnormal findings: Secondary | ICD-10-CM | POA: Diagnosis not present

## 2021-08-23 DIAGNOSIS — Z124 Encounter for screening for malignant neoplasm of cervix: Secondary | ICD-10-CM | POA: Diagnosis present

## 2021-08-23 DIAGNOSIS — E785 Hyperlipidemia, unspecified: Secondary | ICD-10-CM

## 2021-08-23 DIAGNOSIS — E1169 Type 2 diabetes mellitus with other specified complication: Secondary | ICD-10-CM | POA: Diagnosis not present

## 2021-08-23 DIAGNOSIS — J4520 Mild intermittent asthma, uncomplicated: Secondary | ICD-10-CM

## 2021-08-23 DIAGNOSIS — Z23 Encounter for immunization: Secondary | ICD-10-CM | POA: Diagnosis not present

## 2021-08-23 DIAGNOSIS — E66812 Obesity, class 2: Secondary | ICD-10-CM

## 2021-08-23 DIAGNOSIS — Z6834 Body mass index (BMI) 34.0-34.9, adult: Secondary | ICD-10-CM

## 2021-08-23 DIAGNOSIS — E669 Obesity, unspecified: Secondary | ICD-10-CM

## 2021-08-23 DIAGNOSIS — Z5181 Encounter for therapeutic drug level monitoring: Secondary | ICD-10-CM

## 2021-08-23 DIAGNOSIS — Z1231 Encounter for screening mammogram for malignant neoplasm of breast: Secondary | ICD-10-CM

## 2021-08-23 DIAGNOSIS — Z6835 Body mass index (BMI) 35.0-35.9, adult: Secondary | ICD-10-CM

## 2021-08-23 DIAGNOSIS — I10 Essential (primary) hypertension: Secondary | ICD-10-CM

## 2021-08-23 DIAGNOSIS — N184 Chronic kidney disease, stage 4 (severe): Secondary | ICD-10-CM

## 2021-08-23 DIAGNOSIS — D631 Anemia in chronic kidney disease: Secondary | ICD-10-CM

## 2021-08-23 NOTE — Progress Notes (Signed)
Name: Janice Jennings   MRN: [id number]    DOB: 08-31-1971   Date:08/23/2021       Progress Note  Subjective  Chief Complaint  physical  HPI  Patient presents for annual CPE, here alone She got labs done this morning at Dr. Assunta Gambles office.    Diet: fruit, vegetables, poultry, dairy Exercise: Walks, vibrating machine, 2 times a week, physical job at [employer]. Discussed recommendations of getting 150 a week of physical activity  [place] Office Visit from 08/23/2021 in [facility]  AUDIT-C Score 0      Depression: Phq 9 is  negative Depression screen [facility] 2/9 08/23/2021 06/15/2021 04/20/2021 03/14/2021 10/27/2020  Decreased Interest 0 0 0 0 0  Down, Depressed, Hopeless 0 0 0 0 0  PHQ - 2 Score 0 0 0 0 0  Altered sleeping 0 0 0 0 0  Tired, decreased energy 0 0 0 0 0  Change in appetite 0 0 0 0 0  Feeling bad or failure about yourself  0 0 0 0 0  Trouble concentrating 0 0 0 0 0  Moving slowly or fidgety/restless 0 0 0 0 0  Suicidal thoughts 0 0 0 0 0  PHQ-9 Score 0 0 0 0 0  Difficult doing work/chores Not difficult at all Not difficult at all - Not difficult at all Not difficult at all  Some recent data might be hidden   Hypertension: BP Readings from Last 3 Encounters:  08/23/21 128/82  08/01/21 139/81  06/15/21 138/80   Obesity: Wt Readings from Last 3 Encounters:  08/23/21 206 lb 14.4 oz (93.8 kg)  08/01/21 208 lb 6.4 oz (94.5 kg)  06/15/21 204 lb 6.4 oz (92.7 kg)   BMI Readings from Last 3 Encounters:  08/23/21 34.43 kg/m  08/01/21 34.68 kg/m  06/15/21 35.09 kg/m     Vaccines:  HPV: up to at age 52 , ask insurance if age between 52-54  Shingrix: 52-74 yo and ask insurance if covered when patient above 52 yo Pneumonia: educated and discussed with patient. Flu: educated and discussed with patient, received today  Hep C Screening: 01/2016 STD testing and prevention (HIV/chl/gon/syphilis): 11/11/19 Intimate partner  violence:none Sexual History : Not currently Menstrual History/LMP/Abnormal Bleeding: postmenopausal,discussed if bleeding occurs to seek medical attention  Incontinence Symptoms: none  Breast cancer:  - Last Mammogram: 09/14/2020, ordered - BRCA gene screening: maternal aunt and grandmother had breast cancer, unknown if family had it done  Osteoporosis: Discussed high calcium and vitamin D supplementation, weight bearing exercises  Cervical cancer screening: 02/2018, will do today  Skin cancer: Discussed monitoring for atypical lesions  Colorectal cancer: 08/15/2018 Lung cancer:  Low Dose CT Chest recommended if Age 81-80 years, 20 pack-year currently smoking OR have quit w/in 15years. Patient does not qualify.   ECG: 04/20/21  Advanced Care Planning: A voluntary discussion about advance care planning including the explanation and discussion of advance directives.  Discussed health care proxy and Living will, and the patient was able to identify a health care proxy as Mom.   Lipids: Lab Results  Component Value Date   CHOL 154 03/14/2021   CHOL 126 08/10/2019   CHOL 178 04/10/2019   Lab Results  Component Value Date   HDL 47 (L) 03/14/2021   HDL 40 (L) 08/10/2019   HDL 51 04/10/2019   Lab Results  Component Value Date   LDLCALC 83 03/14/2021   LDLCALC 66 08/10/2019   LDLCALC 109 (H) 04/10/2019  Lab Results  Component Value Date   TRIG 137 03/14/2021   TRIG 123 08/10/2019   TRIG 86 04/10/2019   Lab Results  Component Value Date   CHOLHDL 3.3 03/14/2021   CHOLHDL 3.2 08/10/2019   CHOLHDL 3.5 04/10/2019   No results found for: LDLDIRECT  Glucose: Glucose  Date Value Ref Range Status  02/14/2013 349 (H) 65 - 99 mg/dL Final   Glucose, Bld  Date Value Ref Range Status  06/15/2021 127 (H) 65 - 99 mg/dL Final    Comment:    .            Fasting reference interval . For someone without known diabetes, a glucose value >125 mg/dL indicates that they may  have diabetes and this should be confirmed with a follow-up test. .   03/14/2021 137 (H) 65 - 99 mg/dL Final    Comment:    .            Fasting reference interval . For someone without known diabetes, a glucose value >125 mg/dL indicates that they may have diabetes and this should be confirmed with a follow-up test. .   11/11/2019 88 65 - 99 mg/dL Final    Comment:    .            Fasting reference interval .    Glucose-Capillary  Date Value Ref Range Status  11/18/2018 138 (H) 70 - 99 mg/dL Final  11/18/2018 120 (H) 70 - 99 mg/dL Final  11/17/2018 188 (H) 70 - 99 mg/dL Final    Patient Active Problem List   Diagnosis Date Noted   Elevated sedimentation rate 08/01/2021   Rheumatoid factor positive 08/01/2021   Bilateral wrist pain 08/01/2021   Chronic kidney disease, stage IV (severe) ([place]) 04/25/2021   Secondary hyperparathyroidism of renal origin ([place]) 10/27/2020   Mild intermittent asthma without complication 33/35/4562   Hot flashes due to menopause 07/08/2020   Osteoarthritis of knee 07/08/2020   Pain in joint of left shoulder 07/08/2020   Lumbar radiculopathy 11/10/2019   Type 2 diabetes mellitus with hyperglycemia, with long-term current use of insulin ([place]) 09/22/2019   Insulin dependent type 2 diabetes mellitus, uncontrolled 09/22/2019   Anemia in chronic kidney disease 08/31/2019   Benign hypertensive kidney disease with chronic kidney disease 08/31/2019   Proteinuria 08/31/2019   Type 2 diabetes mellitus with diabetic chronic kidney disease ([place]) 08/31/2019   Hyperlipidemia associated with type 2 diabetes mellitus ([place]) 08/10/2019   Stage 3b chronic kidney disease ([place]) 08/10/2019   Hemoptysis 11/15/2018   Asthma    Class 2 severe obesity with serious comorbidity and body mass index (BMI) of 35.0 to 35.9 in adult ([place]) 09/22/2018   Anemia 06/19/2018   Diabetic retinopathy of both eyes associated with type 2 diabetes mellitus ([place]) 04/11/2018    Essential hypertension 03/07/2015    Past Surgical History:  Procedure Laterality Date   BREAST REDUCTION SURGERY  2001   CESAREAN SECTION     COLONOSCOPY WITH PROPOFOL N/A 08/15/2018   Procedure: COLONOSCOPY WITH PROPOFOL;  Surgeon: Jonathon Bellows, MD;  Location[facility] ENDOSCOPY;  Service: Gastroenterology;  Laterality: N/A;   ESOPHAGOGASTRODUODENOSCOPY (EGD) WITH PROPOFOL N/A 08/15/2018   Procedure: ESOPHAGOGASTRODUODENOSCOPY (EGD) WITH PROPOFOL;  Surgeon: Jonathon Bellows, MD;  Location: [facility] ENDOSCOPY;  Service: Gastroenterology;  Laterality: N/A;   GIVENS CAPSULE STUDY N/A 09/08/2018   Procedure: GIVENS CAPSULE STUDY;  Surgeon: Jonathon Bellows, MD;  Location: [facility] ENDOSCOPY;  Service: Gastroenterology;  Laterality: N/A;   REDUCTION  MAMMAPLASTY Bilateral 2000   SHOULDER SURGERY Left    TUBAL LIGATION      Family History  Problem Relation Age of Onset   Diabetes Father    Hyperlipidemia Father    Hypertension Father    Cancer Maternal Aunt 51       leukemia - died   Diabetes Maternal Grandmother    Cancer Maternal Grandmother        breast cancer   Stroke Maternal Grandmother    Breast cancer Maternal Grandmother    Diabetes Daughter        Pre-diabetes   Diabetes Maternal Grandfather    Hyperlipidemia Maternal Grandfather    Hypertension Maternal Grandfather    Cancer Maternal Aunt 21       breast cancer - died   Ovarian cancer Neg Hx    Colon cancer Neg Hx     Social History   Socioeconomic History   Marital status: Single    Spouse name: Not on file   Number of children: 2   Years of education: 18   Highest education level: Not on file  Occupational History   Occupation: Social worker at [employer]    Employer: [employer]  Tobacco Use   Smoking status: Never   Smokeless tobacco: Never  Vaping Use   Vaping Use: Never used  Substance and Sexual Activity   Alcohol use: Yes    Alcohol/week: 0.0 standard drinks    Comment: occ   Drug use: No   Sexual  activity: Not on file  Other Topics Concern   Not on file  Social History Narrative   Thania grew up in [place], [place] and also in [place]. She attending the [employer] and obtained her Bachelors in Psychology. She then attended [employer] and obtained her Masters in Psychology. 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 She lives at home with her two children, Darius age 61 and Berwick age 76. They have a dog named Coco who she describes as having separation anxiety. Corynn enjoys activities with the children. They enjoy outdoor activities, watching movies.     Social Determinants of Health   Financial Resource Strain: Low Risk    Difficulty of Paying Living Expenses: Not hard at all  Food Insecurity: No Food Insecurity   Worried About Charity fundraiser in the Last Year: Never true   [place] in the Last Year: Never true  Transportation Needs: No Transportation Needs   Lack of Transportation (Medical): No   Lack of Transportation (Non-Medical): No  Physical Activity: Insufficiently Active   Days of Exercise per Week: 1 day   Minutes of Exercise per Session: 20 min  Stress: No Stress Concern Present   Feeling of Stress : Only a little  Social Connections: Not on file  Intimate Partner Violence: Not At Risk   Fear of Current or Ex-Partner: No   Emotionally Abused: No   Physically Abused: No   Sexually Abused: No     Current Outpatient Medications:    albuterol (VENTOLIN HFA) 108 (90 Base) MCG/ACT inhaler, Inhale 2 puffs into the lungs every 4 (four) hours as needed for wheezing or shortness of breath., Disp: 1 each, Rfl: 1   amLODipine (NORVASC) 10 MG tablet, Take 1 tablet (10 mg total) by mouth daily., Disp: 90 tablet, Rfl: 3   atorvastatin (LIPITOR) 80 MG tablet, Take 1 tablet (80 mg total) by mouth at bedtime., Disp: 90  tablet, Rfl: 3   carvedilol (COREG) 6.25 MG tablet, TAKE 1 TABLET BY MOUTH  TWICE DAILY WITH A MEAL, Disp:  180 tablet, Rfl: 3   Continuous Blood Gluc Sensor (FREESTYLE LIBRE 2 SENSOR) MISC, Place on arm, Disp: 2 each, Rfl: 0   cyclobenzaprine (FLEXERIL) 5 MG tablet, Take 1 tablet (5 mg total) by mouth 3 (three) times daily as needed for muscle spasms., Disp: 30 tablet, Rfl: 1   ezetimibe (ZETIA) 10 MG tablet, TAKE 1 TABLET BY MOUTH  DAILY, Disp: 90 tablet, Rfl: 3   ferrous sulfate 325 (65 FE) MG tablet, Take 325 mg by mouth daily with breakfast., Disp: , Rfl:    gabapentin (NEURONTIN) 300 MG capsule, TAKE 1 CAPSULE BY MOUTH IN  THE MORNING AND 1 CAPSULE  BY MOUTH IN THE AFTERNOON  TAKE 2 CAPSULES BY MOUTH  BEFORE BEDTIME, Disp: 360 capsule, Rfl: 3   hydrALAZINE (APRESOLINE) 100 MG tablet, Take 1 tablet (100 mg total) by mouth 3 (three) times daily., Disp: 270 tablet, Rfl: 3   LANTUS SOLOSTAR 100 UNIT/ML Solostar Pen, INJECT SUBCUTANEOUSLY 60 TO 80 UNITS DAILY, Disp: 75 mL, Rfl: 3   losartan (COZAAR) 100 MG tablet, Take 100 mg by mouth daily., Disp: , Rfl:    Multiple Vitamin (MULTIVITAMIN) tablet, Take 1 tablet by mouth daily., Disp: , Rfl:    PENNSAID 2 % SOLN, SMARTSIG:1 Pump Topical Twice Daily PRN, Disp: , Rfl:    Semaglutide, 2 MG/DOSE, (OZEMPIC, 2 MG/DOSE,) 8 MG/3ML SOPN, Inject 2 mg into the skin once a week., Disp: 3 mL, Rfl: 3   venlafaxine XR (EFFEXOR-XR) 75 MG 24 hr capsule, TAKE 1 CAPSULE BY MOUTH  DAILY WITH BREAKFAST, Disp: 90 capsule, Rfl: 3   Insulin Pen Needle 29G X 8MM MISC, Use as directed with insulin daily (Patient not taking: Reported on 08/23/2021), Disp: 100 each, Rfl: 1  Allergies  Allergen Reactions   Hydrocodone Itching     ROS  Constitutional: Negative for fever or weight change.  Respiratory: Negative for cough and shortness of breath.   Cardiovascular: Negative for chest pain or palpitations.  Gastrointestinal: Negative for abdominal pain, no bowel changes.  Musculoskeletal: Negative for gait problem or joint swelling.  Skin: Negative for rash.  Neurological:  Negative for dizziness or headache.  No other specific complaints in a complete review of systems (except as listed in HPI above).   Objective  Vitals:   08/23/21 1059  BP: 128/82  Pulse: 82  Resp: 16  Temp: 98.2 F (36.8 C)  TempSrc: Oral  SpO2: 99%  Weight: 206 lb 14.4 oz (93.8 kg)  Height: _0  (1.651 m)    Body mass index is 34.43 kg/m.  Physical Exam Constitutional: Patient appears well-developed and well-nourished. No distress.  HENT: Head: Normocephalic and atraumatic. Ears: B TMs ok, no erythema or effusion; Nose: Nose normal. Mouth/Throat: Not done Eyes: Conjunctivae and EOM are normal. Pupils are equal, round, and reactive to light. No scleral icterus.  Neck: Normal range of motion. Neck supple. No JVD present. No thyromegaly present.  Cardiovascular: Normal rate, regular rhythm and normal heart sounds.  No murmur heard. No BLE edema. Pulmonary/Chest: Effort normal and breath sounds normal. No respiratory distress. Abdominal: Soft. Bowel sounds are normal, no distension. There is no tenderness. no masses Breast: no lumps or masses, no nipple discharge or rashes FEMALE GENITALIA:  External genitalia normal External urethra normal Vaginal vault normal without discharge or lesions Cervix normal without discharge or lesions Bimanual exam  normal without masses RECTAL: not done Musculoskeletal: Normal range of motion, no joint effusions. No gross deformities Neurological: he is alert and oriented to person, place, and time. No cranial nerve deficit. Coordination, balance, strength, speech and gait are normal.  Skin: Skin is warm and dry. No rash noted. No erythema.  Psychiatric: Patient has a normal mood and affect. behavior is normal. Judgment and thought content normal.   Recent Results (from the past 2160 hour(s))  Cervicovaginal ancillary only     Status: None   Collection Time: 06/15/21  9:31 AM  Result Value Ref Range   Neisseria Gonorrhea Negative     Chlamydia Negative    Trichomonas Negative    Bacterial Vaginitis (gardnerella) Negative    Candida Vaginitis Negative    Candida Glabrata Negative    Comment      Normal Reference Range Bacterial Vaginosis - Negative   Comment Normal Reference [place] Chlamydia - Negative    Comment      Normal Reference Range Neisseria Gonorrhea - Negative   Comment Normal Reference Range Candida Species - Negative    Comment Normal Reference Range Candida Galbrata - Negative    Comment Normal Reference Range Trichomonas - Negative   COMPLETE METABOLIC PANEL WITH GFR     Status: Abnormal   Collection Time: 06/15/21  9:50 AM  Result Value Ref Range   Glucose, Bld 127 (H) 65 - 99 mg/dL    Comment: .            Fasting reference interval . For someone without known diabetes, a glucose value >125 mg/dL indicates that they may have diabetes and this should be confirmed with a follow-up test. .    BUN 31 (H) 7 - 25 mg/dL   Creat 2.18 (H) 0.50 - 1.03 mg/dL   eGFR 27 (L) > OR = 60 mL/min/1.48m2    Comment: The eGFR is based on the CKD-EPI 2021 equation. To calculate  the new eGFR from a previous Creatinine or Cystatin C result, go to https://www.kidney.org/professionals/ kdoqi/gfr%5Fcalculator    BUN/Creatinine Ratio 14 6 - 22 (calc)   Sodium 143 135 - 146 mmol/L   Potassium 4.7 3.5 - 5.3 mmol/L   Chloride 109 98 - 110 mmol/L   CO2 28 20 - 32 mmol/L   Calcium 9.8 8.6 - 10.4 mg/dL   Total Protein 7.1 6.1 - 8.1 g/dL   Albumin 4.0 3.6 - 5.1 g/dL   Globulin 3.1 1.9 - 3.7 g/dL (calc)   AG Ratio 1.3 1.0 - 2.5 (calc)   Total Bilirubin 0.4 0.2 - 1.2 mg/dL   Alkaline phosphatase (APISO) 91 37 - 153 U/L   AST 16 10 - 35 U/L   ALT 22 6 - 29 U/L  TSH     Status: None   Collection Time: 06/15/21  9:50 AM  Result Value Ref Range   TSH 3.24 mIU/L    Comment:           Reference Range .           > or = 20 Years  0.40-4.50 .                Pregnancy Ranges           First trimester    0.26-2.66            Second trimester   0.55-2.73           Third trimester    0.43-2.91   Sedimentation rate  Status: Abnormal   Collection Time: 06/15/21  9:50 AM  Result Value Ref Range   Sed Rate 43 (H) 0 - 30 mm/h  ANA     Status: None   Collection Time: 06/15/21  9:50 AM  Result Value Ref Range   Anti Nuclear Antibody (ANA) NEGATIVE NEGATIVE    Comment: ANA IFA is a first line screen for detecting the presence of up to approximately 150 autoantibodies in various autoimmune diseases. A negative ANA IFA result suggests an ANA-associated autoimmune disease is not present at this time, but is not definitive. If there is high clinical suspicion for Sjogren's syndrome, testing for anti-SS-A/Ro antibody should be considered. Anti-Jo-1 antibody should be considered for clinically suspected inflammatory myopathies. . AC-0: Negative . International Consensus on ANA Patterns (https://www.hernandez-brewer.com/) . For additional information, please refer to http://education.[facility].com/faq/FAQ177 (This link is being provided for informational/ educational purposes only.) .   C-reactive protein     Status: None   Collection Time: 06/15/21  9:50 AM  Result Value Ref Range   CRP 1.4 <8.0 mg/L  Rheumatoid factor     Status: Abnormal   Collection Time: 06/15/21  9:50 AM  Result Value Ref Range   Rhuematoid fact SerPl-aCnc 31 (H) <14 IU/mL  Sedimentation rate     Status: None   Collection Time: 08/01/21 10:06 AM  Result Value Ref Range   Sed Rate 25 0 - 30 mm/h  Cyclic citrul peptide antibody, IgG     Status: None   Collection Time: 08/01/21 10:06 AM  Result Value Ref Range   Cyclic Citrullin Peptide Ab <16 UNITS    Comment: Reference Range Negative:            <20 Weak Positive:       20-39 Moderate Positive:   40-59 Strong Positive:     >59 .      Fall Risk: Fall Risk  08/23/2021 06/15/2021 04/20/2021 03/14/2021 10/27/2020  Falls in the past year? 0 0 0 0 1  Number  falls in past yr: 0 0 0 0 1  Injury with Fall? 0 0 0 0 0  Risk for fall due to : - - - - History of fall(s)  Follow up Falls evaluation completed - Falls prevention discussed - Falls evaluation completed    Functional Status Survey: Is the patient deaf or have difficulty hearing?: No Does the patient have difficulty seeing, even when wearing glasses/contacts?: No Does the patient have difficulty concentrating, remembering, or making decisions?: No Does the patient have difficulty walking or climbing stairs?: No Does the patient have difficulty dressing or bathing?: No Does the patient have difficulty doing errands alone such as visiting a doctor's office or shopping?: No   Assessment & Plan  1. Physical exam, annual  - MM 3D SCREEN BREAST BILATERAL; Future - Cytology - PAP  2. Essential hypertension   3. Class 2 severe obesity with serious comorbidity and body mass index (BMI) of 35.0 to 35.9 in adult, unspecified obesity type ([place])   4. Hyperlipidemia associated with type 2 diabetes mellitus ([place])   5. Encounter for medication monitoring   6. DM type 2 with diabetic peripheral neuropathy (HCC)   7. Class 1 obesity with serious comorbidity and body mass index (BMI) of 34.0 to 34.9 in adult, unspecified obesity type   8. Anemia due to stage 3b chronic kidney disease (HCC)   9. Stage 4 chronic kidney disease ([place])   10. Anemia in stage 4 chronic kidney disease ([place])  11. Mild intermittent asthma without complication   12. Need for influenza vaccination  - Flu Vaccine QUAD 6+ mos PF IM (Fluarix Quad PF)  13. Encounter for screening mammogram for malignant neoplasm of breast  - MM 3D SCREEN BREAST BILATERAL; Future  14. Screening for cervical cancer  - Cytology - PAP  15. Chronic kidney disease, stage IV (severe) ([place]) -saw nephrologist this am and had labs drawn  -USPSTF grade A and B recommendations reviewed with patient; age-appropriate  recommendations, preventive care, screening tests, etc discussed and encouraged; healthy living encouraged; see AVS for patient education given to patient -Discussed importance of 150 minutes of physical activity weekly, eat two servings of fish weekly, eat one serving of tree nuts ( cashews, pistachios, pecans, almonds.Marland Kitchen) every other day, eat 6 servings of fruit/vegetables daily and drink plenty of water and avoid sweet beverages.

## 2021-08-23 NOTE — Patient Instructions (Signed)
Preventive Care 52-52 Years Old, Female Preventive care refers to lifestyle choices and visits with your health care provider that can promote health and wellness. This includes: A yearly physical exam. This is also called an annual wellness visit. Regular dental and eye exams. Immunizations. Screening for certain conditions. Healthy lifestyle choices, such as: Eating a healthy diet. Getting regular exercise. Not using drugs or products that contain nicotine and tobacco. Limiting alcohol use. What can I expect for my preventive care visit? Physical exam Your health care provider will check your: Height and weight. These may be used to calculate your BMI (body mass index). BMI is a measurement that tells if you are at a healthy weight. Heart rate and blood pressure. Body temperature. Skin for abnormal spots. Counseling Your health care provider may ask you questions about your: Past medical problems. Family's medical history. Alcohol, tobacco, and drug use. Emotional well-being. Home life and relationship well-being. Sexual activity. Diet, exercise, and sleep habits. Work and work environment. Access to firearms. Method of birth control. Menstrual cycle. Pregnancy history. What immunizations do I need? Vaccines are usually given at various ages, according to a schedule. Your health care provider will recommend vaccines for you based on your age, medical history, and lifestyle or other factors, such as travel or where you work. What tests do I need? Blood tests Lipid and cholesterol levels. These may be checked every 5 years, or more often if you are over 52 years old. Hepatitis C test. Hepatitis B test. Screening Lung cancer screening. You may have this screening every year starting at age 52 if you have a 30-pack-year history of smoking and currently smoke or have quit within the past 15 years. Colorectal cancer screening. All adults should have this screening starting at  age 52 and continuing until age 75. Your health care provider may recommend screening at age 52 if you are at increased risk. You will have tests every 1-10 years, depending on your results and the type of screening test. Diabetes screening. This is done by checking your blood sugar (glucose) after you have not eaten for a while (fasting). You may have this done every 1-3 years. Mammogram. This may be done every 1-2 years. Talk with your health care provider about when you should start having regular mammograms. This may depend on whether you have a family history of breast cancer. BRCA-related cancer screening. This may be done if you have a family history of breast, ovarian, tubal, or peritoneal cancers. Pelvic exam and Pap test. This may be done every 3 years starting at age 52. Starting at age 52, this may be done every 5 years if you have a Pap test in combination with an HPV test. Other tests STD (sexually transmitted disease) testing, if you are at risk. Bone density scan. This is done to screen for osteoporosis. You may have this scan if you are at high risk for osteoporosis. Talk with your health care provider about your test results, treatment options, and if necessary, the need for more tests. Follow these instructions at home: Eating and drinking  Eat a diet that includes fresh fruits and vegetables, whole grains, lean protein, and low-fat dairy products. Take vitamin and mineral supplements as recommended by your health care provider. Do not drink alcohol if: Your health care provider tells you not to drink. You are pregnant, may be pregnant, or are planning to become pregnant. If you drink alcohol: Limit how much you have to 0-1 drink a day. Be   aware of how much alcohol is in your drink. In the [place] one drink equals one 12 oz bottle of beer (355 mL), one 5 oz glass of wine (148 mL), or one 1 oz glass of hard liquor (44 mL). Lifestyle Take daily care of your teeth and  gums. Brush your teeth every morning and night with fluoride toothpaste. Floss one time each day. Stay active. Exercise for at least 30 minutes 5 or more days each week. Do not use any products that contain nicotine or tobacco, such as cigarettes, e-cigarettes, and chewing tobacco. If you need help quitting, ask your health care provider. Do not use drugs. If you are sexually active, practice safe sex. Use a condom or other form of protection to prevent STIs (sexually transmitted infections). If you do not wish to become pregnant, use a form of birth control. If you plan to become pregnant, see your health care provider for a prepregnancy visit. If told by your health care provider, take low-dose aspirin daily starting at age 52. Find healthy ways to cope with stress, such as: Meditation, yoga, or listening to music. Journaling. Talking to a trusted person. Spending time with friends and family. Safety Always wear your seat belt while driving or riding in a vehicle. Do not drive: If you have been drinking alcohol. Do not ride with someone who has been drinking. When you are tired or distracted. While texting. Wear a helmet and other protective equipment during sports activities. If you have firearms in your house, make sure you follow all gun safety procedures. What's next? Visit your health care provider once a year for an annual wellness visit. Ask your health care provider how often you should have your eyes and teeth checked. Stay up to date on all vaccines. This information is not intended to replace advice given to you by your health care provider. Make sure you discuss any questions you have with your health care provider. Document Revised: 12/23/2020 Document Reviewed: 06/26/2018 Elsevier Patient Education  2022 [employer].

## 2021-08-24 ENCOUNTER — Encounter: Payer: Self-pay | Admitting: Family Medicine

## 2021-08-25 LAB — CYTOLOGY - PAP
Adequacy: ABSENT
Chlamydia: NEGATIVE
Comment: NEGATIVE
Comment: NORMAL
Diagnosis: NEGATIVE
Neisseria Gonorrhea: NEGATIVE

## 2021-08-30 ENCOUNTER — Other Ambulatory Visit: Payer: Self-pay | Admitting: Family Medicine

## 2021-08-30 DIAGNOSIS — M5416 Radiculopathy, lumbar region: Secondary | ICD-10-CM

## 2021-09-03 ENCOUNTER — Other Ambulatory Visit: Payer: Self-pay | Admitting: Family Medicine

## 2021-09-03 DIAGNOSIS — E1165 Type 2 diabetes mellitus with hyperglycemia: Secondary | ICD-10-CM

## 2021-09-03 DIAGNOSIS — E1100 Type 2 diabetes mellitus with hyperosmolarity without nonketotic hyperglycemic-hyperosmolar coma (NKHHC): Secondary | ICD-10-CM

## 2021-09-03 NOTE — Telephone Encounter (Signed)
Requested Prescriptions  Pending Prescriptions Disp Refills  . Continuous Blood Gluc Sensor (FREESTYLE LIBRE 2 SENSOR) MISC [Pharmacy Med Name: FREESTYLE LIBRE 2 SENSOR] 2 each 0    Sig: PLACE ON ARM AND USE AS DIRECTED     Endocrinology: Diabetes - Testing Supplies Passed - 09/03/2021  1:44 PM      Passed - Valid encounter within last 12 months    Recent Outpatient Visits          1 week ago Physical exam, annual   [facility] Serafina Royals F, FNP   2 months ago Insulin dependent type 2 diabetes mellitus, uncontrolled [facility])   [facility] Delsa Grana, PA-C   4 months ago DM type 2 with diabetic peripheral neuropathy [facility])   [facility] [place], Malachy Mood, NP   5 months ago Insulin dependent type 2 diabetes mellitus, uncontrolled [facility])   [facility] [place], Kristeen Miss, PA-C   10 months ago Insulin dependent type 2 diabetes mellitus, uncontrolled [facility])   [facility] Delsa Grana, [place]

## 2021-09-11 ENCOUNTER — Encounter: Payer: Self-pay | Admitting: Family Medicine

## 2021-09-11 ENCOUNTER — Other Ambulatory Visit: Payer: Self-pay | Admitting: Family Medicine

## 2021-09-11 DIAGNOSIS — E1100 Type 2 diabetes mellitus with hyperosmolarity without nonketotic hyperglycemic-hyperosmolar coma (NKHHC): Secondary | ICD-10-CM

## 2021-09-18 ENCOUNTER — Encounter: Payer: Self-pay | Admitting: Family Medicine

## 2021-10-01 ENCOUNTER — Other Ambulatory Visit: Payer: Self-pay | Admitting: Family Medicine

## 2021-10-01 ENCOUNTER — Encounter: Payer: Self-pay | Admitting: Family Medicine

## 2021-10-01 DIAGNOSIS — E1142 Type 2 diabetes mellitus with diabetic polyneuropathy: Secondary | ICD-10-CM

## 2021-10-01 NOTE — Telephone Encounter (Signed)
dc'd 03/14/21 Dose changed Delsa Grana  Requested Prescriptions  Refused Prescriptions Disp Refills  . OZEMPIC, 1 MG/DOSE, 4 MG/3ML SOPN [Pharmacy Med Name: Ozempic (1 MG/DOSE) 4 MG/3ML Subcutaneous Solution Pen-injector] 9 mL 3    Sig: INJECT SUBCUTANEOUSLY 1 MG  ONCE WEEKLY     Endocrinology:  Diabetes - GLP-1 Receptor Agonists Passed - 10/01/2021 11:48 AM      Passed - HBA1C is between 0 and 7.9 and within 180 days    Hemoglobin A1C  Date Value Ref Range Status  04/20/2021 7.5 (A) 4.0 - 5.6 % Final   Hgb A1c MFr Bld  Date Value Ref Range Status  11/11/2019 7.2 (H) <5.7 % of total Hgb Final    Comment:    For someone without known diabetes, a hemoglobin A1c value of 6.5% or greater indicates that they may have  diabetes and this should be confirmed with a follow-up  test. . For someone with known diabetes, a value <7% indicates  that their diabetes is well controlled and a value  greater than or equal to 7% indicates suboptimal  control. A1c targets should be individualized based on  duration of diabetes, age, comorbid conditions, and  other considerations. . Currently, no consensus exists regarding use of hemoglobin A1c for diagnosis of diabetes for children. Renella Cunas - Valid encounter within last 6 months    Recent Outpatient Visits          1 month ago Physical exam, annual   [facility] Serafina Royals F, FNP   3 months ago Insulin dependent type 2 diabetes mellitus, uncontrolled [facility])   [facility] Delsa Grana, PA-C   5 months ago DM type 2 with diabetic peripheral neuropathy [facility])   [facility] [place], Malachy Mood, NP   6 months ago Insulin dependent type 2 diabetes mellitus, uncontrolled [facility])   [facility] Delsa Grana, PA-C   11 months ago Insulin dependent type 2 diabetes mellitus, uncontrolled [facility])   [facility] Delsa Grana, [place]

## 2021-10-02 ENCOUNTER — Encounter: Payer: Self-pay | Admitting: Nurse Practitioner

## 2021-10-02 ENCOUNTER — Telehealth (INDEPENDENT_AMBULATORY_CARE_PROVIDER_SITE_OTHER): Payer: 59 | Admitting: Nurse Practitioner

## 2021-10-02 ENCOUNTER — Other Ambulatory Visit: Payer: Self-pay

## 2021-10-02 DIAGNOSIS — J4520 Mild intermittent asthma, uncomplicated: Secondary | ICD-10-CM | POA: Diagnosis not present

## 2021-10-02 DIAGNOSIS — J069 Acute upper respiratory infection, unspecified: Secondary | ICD-10-CM

## 2021-10-02 MED ORDER — BENZONATATE 100 MG PO CAPS: 200.0000 mg | ORAL_CAPSULE | Freq: Two times a day (BID) | ORAL | 0 refills | Status: DC | PRN

## 2021-10-02 NOTE — Progress Notes (Signed)
Name: Janice Jennings   MRN: [id number]    DOB: October 11, 1971   Date:10/02/2021       Progress Note  Subjective  Chief Complaint  Chief Complaint  Patient presents with   URI    Cough, congested, hoarse, cold symptoms for 1 week    I connected with  [name]  on 10/02/21 at 09:20 am by a video enabled telemedicine application and verified that I am speaking with the correct person using two identifiers.  I discussed the limitations of evaluation and management by telemedicine and the availability of in person appointments. The patient expressed understanding and agreed to proceed with a virtual visit  Staff also discussed with the patient that there may be a patient responsible charge related to this service. Patient Location: home Provider Location: [facility] Additional Individuals present: alone  HPI  URI: She reports nasal congestion, cough that started last Sunday.  She says she felt like she was getting better but then on Saturday nasal congestion got worse and she had a productive cough.  She denies fever, shortness of breath or wheezing.  She says she took a home Covid test and it was negative.  She has been taking Mucinex and drinking hot tea for her symptoms.  Discussed OTC treatments and to push fluids.   Asthma: She says she is not having any problems with her asthma.  She has not had to use her inhaler and denies any shortness of breath or wheezing.    Patient Active Problem List   Diagnosis Date Noted   Elevated sedimentation rate 08/01/2021   Rheumatoid factor positive 08/01/2021   Bilateral wrist pain 08/01/2021   Chronic kidney disease, stage IV (severe) ([place]) 04/25/2021   Secondary hyperparathyroidism of renal origin ([place]) 10/27/2020   Mild intermittent asthma without complication 87/86/7672   Hot flashes due to menopause 07/08/2020   Osteoarthritis of knee 07/08/2020   Pain in joint of left shoulder 07/08/2020   Lumbar radiculopathy 11/10/2019   Type 2 diabetes  mellitus with hyperglycemia, with long-term current use of insulin ([place]) 09/22/2019   Insulin dependent type 2 diabetes mellitus, uncontrolled 09/22/2019   Anemia in chronic kidney disease 08/31/2019   Benign hypertensive kidney disease with chronic kidney disease 08/31/2019   Proteinuria 08/31/2019   Type 2 diabetes mellitus with diabetic chronic kidney disease ([place]) 08/31/2019   Hyperlipidemia associated with type 2 diabetes mellitus ([place]) 08/10/2019   Hemoptysis 11/15/2018   Asthma    Class 2 severe obesity with serious comorbidity and body mass index (BMI) of 35.0 to 35.9 in adult ([place]) 09/22/2018   Anemia 06/19/2018   Diabetic retinopathy of both eyes associated with type 2 diabetes mellitus ([place]) 04/11/2018   Essential hypertension 03/07/2015    Social History   Tobacco Use   Smoking status: Never   Smokeless tobacco: Never  Substance Use Topics   Alcohol use: Yes    Alcohol/week: 0.0 standard drinks    Comment: occ     Current Outpatient Medications:    albuterol (VENTOLIN HFA) 108 (90 Base) MCG/ACT inhaler, Inhale 2 puffs into the lungs every 4 (four) hours as needed for wheezing or shortness of breath., Disp: 1 each, Rfl: 1   amLODipine (NORVASC) 10 MG tablet, Take 1 tablet (10 mg total) by mouth daily., Disp: 90 tablet, Rfl: 3   atorvastatin (LIPITOR) 80 MG tablet, Take 1 tablet (80 mg total) by mouth at bedtime., Disp: 90 tablet, Rfl: 3   carvedilol (COREG) 6.25 MG tablet, TAKE 1  TABLET BY MOUTH  TWICE DAILY WITH A MEAL, Disp: 180 tablet, Rfl: 3   cyclobenzaprine (FLEXERIL) 5 MG tablet, Take 1 tablet (5 mg total) by mouth 3 (three) times daily as needed for muscle spasms., Disp: 30 tablet, Rfl: 1   ezetimibe (ZETIA) 10 MG tablet, TAKE 1 TABLET BY MOUTH  DAILY, Disp: 90 tablet, Rfl: 3   ferrous sulfate 325 (65 FE) MG tablet, Take 325 mg by mouth daily with breakfast., Disp: , Rfl:    gabapentin (NEURONTIN) 300 MG capsule, TAKE 1 CAPSULE BY MOUTH IN  THE MORNING AND 1  CAPSULE  BY MOUTH IN THE AFTERNOON  TAKE 2 CAPSULES BY MOUTH  BEFORE BEDTIME, Disp: 360 capsule, Rfl: 3   hydrALAZINE (APRESOLINE) 100 MG tablet, Take 1 tablet (100 mg total) by mouth 3 (three) times daily., Disp: 270 tablet, Rfl: 3   LANTUS SOLOSTAR 100 UNIT/ML Solostar Pen, INJECT SUBCUTANEOUSLY 60 TO 80 UNITS DAILY, Disp: 75 mL, Rfl: 3   losartan (COZAAR) 100 MG tablet, Take 100 mg by mouth daily., Disp: , Rfl:    Multiple Vitamin (MULTIVITAMIN) tablet, Take 1 tablet by mouth daily., Disp: , Rfl:    PENNSAID 2 % SOLN, SMARTSIG:1 Pump Topical Twice Daily PRN, Disp: , Rfl:    Semaglutide, 2 MG/DOSE, (OZEMPIC, 2 MG/DOSE,) 8 MG/3ML SOPN, Inject 2 mg into the skin once a week., Disp: 3 mL, Rfl: 3   venlafaxine XR (EFFEXOR-XR) 75 MG 24 hr capsule, TAKE 1 CAPSULE BY MOUTH  DAILY WITH BREAKFAST, Disp: 90 capsule, Rfl: 3   Continuous Blood Gluc Sensor (FREESTYLE LIBRE 2 SENSOR) MISC, PLACE ON ARM AND USE AS DIRECTED (Patient not taking: Reported on 10/02/2021), Disp: 2 each, Rfl: 0  Allergies  Allergen Reactions   Hydrocodone Itching    I personally reviewed active problem list, medication list, allergies, notes from last encounter with the patient/caregiver today.  ROS  Constitutional: Negative for fever or weight change.  HEENT: positive for nasal congestion Respiratory: Positive for cough, negative for shortness of breath.   Cardiovascular: Negative for chest pain or palpitations.  Gastrointestinal: Negative for abdominal pain, no bowel changes.  Musculoskeletal: Negative for gait problem or joint swelling.  Skin: Negative for rash.  Neurological: Negative for dizziness or headache.  No other specific complaints in a complete review of systems (except as listed in HPI above).   Objective  Virtual encounter, vitals not obtained.  There is no height or weight on file to calculate BMI.  Nursing Note and Vital Signs reviewed.  Physical Exam  Awake, alert and oriented, speaking in  complete sentences  No results found for this or any previous visit (from the past 72 hour(s)).  Assessment & Plan  1. Viral upper respiratory tract infection -discussed OTC treatments for symptoms -push fluids and get rest - benzonatate (TESSALON) 100 MG capsule; Take 2 capsules (200 mg total) by mouth 2 (two) times daily as needed for cough.  Dispense: 20 capsule; Refill: 0  2. Mild intermittent asthma without complication -continue current treatment  -Red flags and when to present for emergency care or RTC including fever >101.53F, chest pain, shortness of breath, new/worsening/un-resolving symptoms, reviewed with patient at time of visit. Follow up and care instructions discussed and provided in AVS. - I discussed the assessment and treatment plan with the patient. The patient was provided an opportunity to ask questions and all were answered. The patient agreed with the plan and demonstrated an understanding of the instructions.  I provided 15 minutes  of non-face-to-face time during this encounter.  Bo Merino, FNP

## 2021-10-04 ENCOUNTER — Other Ambulatory Visit: Payer: Self-pay | Admitting: Nurse Practitioner

## 2021-10-04 ENCOUNTER — Other Ambulatory Visit: Payer: Self-pay | Admitting: Family Medicine

## 2021-10-04 ENCOUNTER — Encounter: Payer: Self-pay | Admitting: Family Medicine

## 2021-10-04 ENCOUNTER — Telehealth: Payer: Self-pay | Admitting: Family Medicine

## 2021-10-04 ENCOUNTER — Other Ambulatory Visit: Payer: Self-pay

## 2021-10-04 DIAGNOSIS — E1169 Type 2 diabetes mellitus with other specified complication: Secondary | ICD-10-CM

## 2021-10-04 DIAGNOSIS — E1142 Type 2 diabetes mellitus with diabetic polyneuropathy: Secondary | ICD-10-CM

## 2021-10-04 DIAGNOSIS — E785 Hyperlipidemia, unspecified: Secondary | ICD-10-CM

## 2021-10-04 MED ORDER — OZEMPIC (2 MG/DOSE) 8 MG/3ML ~~LOC~~ SOPN: 2.0000 mg | PEN_INJECTOR | SUBCUTANEOUS | 3 refills | Status: DC

## 2021-10-04 NOTE — Telephone Encounter (Signed)
Confirm with patient if she needed a refill, pt states she does and if you could please send it to her Mail service pharmacy. Thank you Almyra Free.

## 2021-10-04 NOTE — Telephone Encounter (Signed)
Requested Prescriptions  Pending Prescriptions Disp Refills  . atorvastatin (LIPITOR) 80 MG tablet [Pharmacy Med Name: Atorvastatin Calcium 80 MG Oral Tablet] 90 tablet 3    Sig: TAKE 1 TABLET BY MOUTH AT  BEDTIME     Cardiovascular:  Antilipid - Statins Failed - 10/04/2021  7:03 AM      Failed - HDL in normal range and within 360 days    HDL  Date Value Ref Range Status  03/14/2021 47 (L) > OR = 50 mg/dL Final         Passed - Total Cholesterol in normal range and within 360 days    Cholesterol  Date Value Ref Range Status  03/14/2021 154 <200 mg/dL Final         Passed - LDL in normal range and within 360 days    LDL Cholesterol (Calc)  Date Value Ref Range Status  03/14/2021 83 mg/dL (calc) Final    Comment:    Reference range: <100 . Desirable range <100 mg/dL for primary prevention;   <70 mg/dL for patients with CHD or diabetic patients  with > or = 2 CHD risk factors. Marland Kitchen LDL-C is now calculated using the Martin-Hopkins  calculation, which is a validated novel method providing  better accuracy than the Friedewald equation in the  estimation of LDL-C.  Cresenciano Genre et al. Annamaria Helling. 1751;025(85): 2061-2068  (http://education.[employer].com/faq/FAQ164)          Passed - Triglycerides in normal range and within 360 days    Triglycerides  Date Value Ref Range Status  03/14/2021 137 <150 mg/dL Final         Passed - Patient is not pregnant      Passed - Valid encounter within last 12 months    Recent Outpatient Visits          2 days ago Viral upper respiratory tract infection   [facility] Bo Merino, FNP   1 month ago Physical exam, annual   [facility] Serafina Royals F, FNP   3 months ago Insulin dependent type 2 diabetes mellitus, uncontrolled [facility])   [facility] Delsa Grana, PA-C   5 months ago DM type 2 with diabetic peripheral neuropathy [facility])   [place],  NP   6 months ago Insulin dependent type 2 diabetes mellitus, uncontrolled [facility])   [facility] Delsa Grana, [place]

## 2021-10-06 ENCOUNTER — Encounter: Payer: Self-pay | Admitting: Family Medicine

## 2021-10-11 ENCOUNTER — Other Ambulatory Visit: Payer: Self-pay

## 2021-10-11 DIAGNOSIS — N1832 Chronic kidney disease, stage 3b: Secondary | ICD-10-CM

## 2021-10-11 DIAGNOSIS — N184 Chronic kidney disease, stage 4 (severe): Secondary | ICD-10-CM

## 2021-10-11 DIAGNOSIS — N2581 Secondary hyperparathyroidism of renal origin: Secondary | ICD-10-CM

## 2021-10-11 DIAGNOSIS — D631 Anemia in chronic kidney disease: Secondary | ICD-10-CM

## 2021-10-11 NOTE — Addendum Note (Signed)
Addended by: Delsa Grana on: 10/11/2021 04:26 PM   Modules accepted: Orders

## 2021-10-16 LAB — HM DIABETES EYE EXAM

## 2021-11-13 ENCOUNTER — Other Ambulatory Visit: Payer: Self-pay | Admitting: Family Medicine

## 2021-11-13 DIAGNOSIS — M5416 Radiculopathy, lumbar region: Secondary | ICD-10-CM

## 2021-12-20 ENCOUNTER — Encounter: Payer: Self-pay | Admitting: Family Medicine

## 2021-12-20 ENCOUNTER — Other Ambulatory Visit: Payer: Self-pay

## 2021-12-20 ENCOUNTER — Encounter (HOSPITAL_COMMUNITY): Payer: Self-pay | Admitting: Emergency Medicine

## 2021-12-20 ENCOUNTER — Ambulatory Visit (HOSPITAL_COMMUNITY)
Admission: EM | Admit: 2021-12-20 | Discharge: 2021-12-20 | Disposition: A | Discharge status: Home / Self Care | Payer: 59

## 2021-12-20 DIAGNOSIS — S01511A Laceration without foreign body of lip, initial encounter: Secondary | ICD-10-CM | POA: Diagnosis not present

## 2021-12-20 NOTE — Discharge Instructions (Signed)
Your laceration is too old to sew or glue. However, you have done an excellent job keeping it clean.  There is no evidence of infection. It is healing very well without any complications. Keep it covered with Vaseline for the next 2 to 3 days. You can use vitamin E on the area to help with scar formation.

## 2021-12-20 NOTE — ED Triage Notes (Signed)
Pt reports a lip laceration x 5 days. Pt states it may needs to be "glued"

## 2021-12-20 NOTE — ED Provider Notes (Signed)
[facility]    CSN: [id number] Arrival date & time: 12/20/21  1712      History   Chief Complaint Chief Complaint  Patient presents with   Lip Laceration    HPI Janice Jennings is a 51 y.o. female.   Very pleasant 51 year old female presents today due to concerns of a laceration to her lower lip on the R.  She states she was in her kitchen on Friday evening and fell, hitting her lip on a cabinet.  She states it cut the lip 5 days ago.  She has been treating it at home with over-the-counter and topical remedies.  She states the only reason she came in today was she sent a picture to her PCP, who stated it needed to be glued.  Patient is up-to-date on her Tdap vaccinations.  She denies any pain, swelling, discharge, drainage, warmth, erythema to the laceration.  She has full range of motion of her lips.  There was no injury to the teeth or tongue.    Past Medical History:  Diagnosis Date   Asthma    Chronic kidney disease    Diabetes mellitus without complication ([place])    Diabetic retinopathy of both eyes associated with type 2 diabetes mellitus ([place]) 04/11/2018   Moderate to severe, non-proliferative DR; referred to retinal specialist; April 10, 2018   Hyperlipidemia    Hypertension    Migraine    Excedrin Tension HA OTC    Patient Active Problem List   Diagnosis Date Noted   Elevated sedimentation rate 08/01/2021   Rheumatoid factor positive 08/01/2021   Bilateral wrist pain 08/01/2021   Chronic kidney disease, stage IV (severe) ([place]) 04/25/2021   Secondary hyperparathyroidism of renal origin ([place]) 10/27/2020   Mild intermittent asthma without complication 19/50/9326   Hot flashes due to menopause 07/08/2020   Osteoarthritis of knee 07/08/2020   Pain in joint of left shoulder 07/08/2020   Lumbar radiculopathy 11/10/2019   Type 2 diabetes mellitus with hyperglycemia, with long-term current use of insulin ([place]) 09/22/2019   Insulin dependent type 2 diabetes  mellitus, uncontrolled 09/22/2019   Anemia in chronic kidney disease 08/31/2019   Benign hypertensive kidney disease with chronic kidney disease 08/31/2019   Proteinuria 08/31/2019   Type 2 diabetes mellitus with diabetic chronic kidney disease ([place]) 08/31/2019   Hyperlipidemia associated with type 2 diabetes mellitus ([place]) 08/10/2019   Hemoptysis 11/15/2018   Class 2 severe obesity with serious comorbidity and body mass index (BMI) of 35.0 to 35.9 in adult ([place]) 09/22/2018   Anemia 06/19/2018   Diabetic retinopathy of both eyes associated with type 2 diabetes mellitus ([place]) 04/11/2018   Essential hypertension 03/07/2015    Past Surgical History:  Procedure Laterality Date   BREAST REDUCTION SURGERY  2001   CESAREAN SECTION     COLONOSCOPY WITH PROPOFOL N/A 08/15/2018   Procedure: COLONOSCOPY WITH PROPOFOL;  Surgeon: Jonathon Bellows, MD;  Location: [facility] ENDOSCOPY;  Service: Gastroenterology;  Laterality: N/A;   ESOPHAGOGASTRODUODENOSCOPY (EGD) WITH PROPOFOL N/A 08/15/2018   Procedure: ESOPHAGOGASTRODUODENOSCOPY (EGD) WITH PROPOFOL;  Surgeon: Jonathon Bellows, MD;  Location: [facility] ENDOSCOPY;  Service: Gastroenterology;  Laterality: N/A;   GIVENS CAPSULE STUDY N/A 09/08/2018   Procedure: GIVENS CAPSULE STUDY;  Surgeon: Jonathon Bellows, MD;  Location: [facility] ENDOSCOPY;  Service: Gastroenterology;  Laterality: N/A;   REDUCTION MAMMAPLASTY Bilateral 2000   SHOULDER SURGERY Left    TUBAL LIGATION      OB History     Gravida  4   Para  2   Term  1   Preterm      AB  2   Living  2      SAB  1   IAB  1   Ectopic      Multiple      Live Births  2            Home Medications    Prior to Admission medications   Medication Sig Start Date End Date Taking? Authorizing Provider  albuterol (VENTOLIN HFA) 108 (90 Base) MCG/ACT inhaler Inhale 2 puffs into the lungs every 4 (four) hours as needed for wheezing or shortness of breath. 10/27/20   Delsa Grana, PA-C  amLODipine (NORVASC) 10  MG tablet Take 1 tablet (10 mg total) by mouth daily. 06/15/21   Delsa Grana, PA-C  atorvastatin (LIPITOR) 80 MG tablet TAKE 1 TABLET BY MOUTH AT  BEDTIME 10/04/21   Bo Merino, FNP  benzonatate (TESSALON) 100 MG capsule Take 2 capsules (200 mg total) by mouth 2 (two) times daily as needed for cough. 10/02/21   Bo Merino, FNP  carvedilol (COREG) 6.25 MG tablet TAKE 1 TABLET BY MOUTH  TWICE DAILY WITH A MEAL 04/19/21   Delsa Grana, PA-C  cyclobenzaprine (FLEXERIL) 5 MG tablet TAKE 1 TABLET BY MOUTH THREE TIMES A DAY AS NEEDED FOR MUSCLE SPASMS 11/13/21   Bo Merino, FNP  ezetimibe (ZETIA) 10 MG tablet TAKE 1 TABLET BY MOUTH  DAILY 06/09/21   Delsa Grana, PA-C  ferrous sulfate 325 (65 FE) MG tablet Take 325 mg by mouth daily with breakfast.    [provider]  gabapentin (NEURONTIN) 300 MG capsule TAKE 1 CAPSULE BY MOUTH IN  THE MORNING AND 1 CAPSULE  BY MOUTH IN THE AFTERNOON  TAKE 2 CAPSULES BY MOUTH  BEFORE BEDTIME 03/17/21   Delsa Grana, PA-C  hydrALAZINE (APRESOLINE) 100 MG tablet Take 1 tablet (100 mg total) by mouth 3 (three) times daily. 06/15/21   Delsa Grana, PA-C  LANTUS SOLOSTAR 100 UNIT/ML Solostar Pen INJECT SUBCUTANEOUSLY 60 TO 80 UNITS DAILY 06/26/21   Delsa Grana, PA-C  losartan (COZAAR) 100 MG tablet Take 100 mg by mouth daily. 04/25/21   [provider]  Multiple Vitamin (MULTIVITAMIN) tablet Take 1 tablet by mouth daily.    [provider]  PENNSAID 2 % SOLN SMARTSIG:1 Pump Topical Twice Daily PRN 03/23/21   [provider]  Semaglutide, 2 MG/DOSE, (OZEMPIC, 2 MG/DOSE,) 8 MG/3ML SOPN Inject 2 mg into the skin once a week. 10/04/21   Bo Merino, FNP  venlafaxine XR (EFFEXOR-XR) 75 MG 24 hr capsule TAKE 1 CAPSULE BY MOUTH  DAILY WITH BREAKFAST 06/09/21   Delsa Grana, PA-C    Family History Family History  Problem Relation Age of Onset   Diabetes Father    Hyperlipidemia Father    Hypertension Father    Cancer Maternal Aunt 51        leukemia - died   Diabetes Maternal Grandmother    Cancer Maternal Grandmother        breast cancer   Stroke Maternal Grandmother    Breast cancer Maternal Grandmother    Diabetes Daughter        Pre-diabetes   Diabetes Maternal Grandfather    Hyperlipidemia Maternal Grandfather    Hypertension Maternal Grandfather    Cancer Maternal Aunt 50       breast cancer - died   Ovarian cancer Neg Hx    Colon cancer Neg  Hx     Social History Social History   Tobacco Use   Smoking status: Never   Smokeless tobacco: Never  Vaping Use   Vaping Use: Never used  Substance Use Topics   Alcohol use: Yes    Alcohol/week: 0.0 standard drinks    Comment: occ   Drug use: No     Allergies   Hydrocodone   Review of Systems Review of Systems  Skin:  Positive for wound.  All other systems reviewed and are negative.   Physical Exam Triage Vital Signs ED Triage Vitals  Enc Vitals Group     BP 12/20/21 1804 (!) 161/100     Pulse Rate 12/20/21 1804 87     Resp 12/20/21 1804 16     Temp 12/20/21 1804 98.2 F (36.8 C)     Temp Source 12/20/21 1804 Oral     SpO2 12/20/21 1804 100 %     Weight 12/20/21 1802 206 lb 12.7 oz (93.8 kg)     Height 12/20/21 1802 5\' 5"  (1.651 m)     Head Circumference --      Peak Flow --      Pain Score 12/20/21 1802 0     Pain Loc --      Pain Edu? --      Excl. in [place]? --    No data found.  Updated Vital Signs BP (!) 161/100 (BP Location: Right Arm)    Pulse 87    Temp 98.2 F (36.8 C) (Oral)    Resp 16    Ht 5\' 5"  (1.651 m)    Wt 206 lb 12.7 oz (93.8 kg)    LMP 05/29/2018    SpO2 100%    BMI 34.41 kg/m   Visual Acuity Right Eye Distance:   Left Eye Distance:   Bilateral Distance:    Right Eye Near:   Left Eye Near:    Bilateral Near:     Physical Exam Vitals and nursing note reviewed.  Constitutional:      General: She is not in acute distress.    Appearance: Normal appearance. She is obese. She is not ill-appearing,  toxic-appearing or diaphoretic.  HENT:     Head: Normocephalic.  Musculoskeletal:     Cervical back: Normal range of motion and neck supple.  Skin:    General: Skin is warm.     Coloration: Skin is not jaundiced.     Findings: No bruising, erythema or rash.     Comments: 1" laceration to right lateral lower lip. The wound is 75 days old, well approximated and appears to be closed and healing well. There is no drainage, discharge, swelling, erythema or warmth. Pt has FROM of lips. No bleeding. There is a scab formation centrally covering the entire length of the wound.  Neurological:     Mental Status: She is alert.     UC Treatments / Results  Labs (all labs ordered are listed, but only abnormal results are displayed) Labs Reviewed - No data to display  EKG   Radiology No results found.  Procedures Procedures (including critical care time)  Medications Ordered in UC Medications - No data to display  Initial Impression / Assessment and Plan / UC Course  I have reviewed the triage vital signs and the nursing notes.  Pertinent labs & imaging results that were available during my care of the patient were reviewed by me and considered in my medical decision making (see chart for details).  Lip laceration - wound is too old to close and has healed favorably by secondary intention.  Overall, I feel this wound will close fully with minimal cosmetic disruption.  There is no sign of infection.  Supportive measures at this point.  Patient is up-to-date on her Tdap and thus defers today.  Final Clinical Impressions(s) / UC Diagnoses   Final diagnoses:  Lip laceration, initial encounter     Discharge Instructions      Your laceration is too old to sew or glue. However, you have done an excellent job keeping it clean.  There is no evidence of infection. It is healing very well without any complications. Keep it covered with Vaseline for the next 2 to 3 days. You can  use vitamin E on the area to help with scar formation.    ED Prescriptions   None    PDMP not reviewed this encounter.   Chaney Malling, [place] 12/20/21 [phone]

## 2022-01-08 ENCOUNTER — Other Ambulatory Visit: Payer: Self-pay | Admitting: Nurse Practitioner

## 2022-01-08 ENCOUNTER — Encounter: Payer: Self-pay | Admitting: Family Medicine

## 2022-01-09 ENCOUNTER — Other Ambulatory Visit: Payer: Self-pay

## 2022-01-09 MED ORDER — FREESTYLE LIBRE 2 SENSOR MISC: 0 refills | Status: DC

## 2022-01-09 NOTE — Telephone Encounter (Signed)
I seen it was discontinue by you Almyra Free on 08/2021 during visit. I called pt and she states she has been on it for a while and would still want to be on it. Please advice ?

## 2022-01-10 ENCOUNTER — Other Ambulatory Visit: Payer: Self-pay

## 2022-01-10 ENCOUNTER — Ambulatory Visit
Admission: RE | Admit: 2022-01-10 | Discharge: 2022-01-10 | Disposition: A | Discharge status: Home / Self Care | Payer: 59 | Source: Ambulatory Visit | Attending: Nurse Practitioner | Admitting: Nurse Practitioner

## 2022-01-10 DIAGNOSIS — Z1231 Encounter for screening mammogram for malignant neoplasm of breast: Secondary | ICD-10-CM

## 2022-01-10 DIAGNOSIS — Z0000 Encounter for general adult medical examination without abnormal findings: Secondary | ICD-10-CM

## 2022-01-29 ENCOUNTER — Encounter: Payer: Self-pay | Admitting: Family Medicine

## 2022-01-29 ENCOUNTER — Other Ambulatory Visit: Payer: Self-pay | Admitting: Nurse Practitioner

## 2022-01-29 ENCOUNTER — Ambulatory Visit (INDEPENDENT_AMBULATORY_CARE_PROVIDER_SITE_OTHER): Payer: 59 | Admitting: Family Medicine

## 2022-01-29 VITALS — BP 160/82 | HR 92 | Temp 98.1°F | Resp 16 | Ht 64.0 in | Wt 212.3 lb

## 2022-01-29 DIAGNOSIS — E1169 Type 2 diabetes mellitus with other specified complication: Secondary | ICD-10-CM | POA: Diagnosis not present

## 2022-01-29 DIAGNOSIS — Z5181 Encounter for therapeutic drug level monitoring: Secondary | ICD-10-CM

## 2022-01-29 DIAGNOSIS — Z6835 Body mass index (BMI) 35.0-35.9, adult: Secondary | ICD-10-CM

## 2022-01-29 DIAGNOSIS — I10 Essential (primary) hypertension: Secondary | ICD-10-CM | POA: Diagnosis not present

## 2022-01-29 DIAGNOSIS — E785 Hyperlipidemia, unspecified: Secondary | ICD-10-CM

## 2022-01-29 DIAGNOSIS — E1165 Type 2 diabetes mellitus with hyperglycemia: Secondary | ICD-10-CM

## 2022-01-29 DIAGNOSIS — Z794 Long term (current) use of insulin: Secondary | ICD-10-CM

## 2022-01-29 DIAGNOSIS — N184 Chronic kidney disease, stage 4 (severe): Secondary | ICD-10-CM

## 2022-01-29 DIAGNOSIS — D631 Anemia in chronic kidney disease: Secondary | ICD-10-CM

## 2022-01-29 MED ORDER — INSULIN PEN NEEDLE 32G X 4 MM MISC: 1 refills | Status: DC

## 2022-01-29 NOTE — Progress Notes (Signed)
? ?Name: Janice Jennings   MRN: [id number]    DOB: 1971/02/03   Date:01/29/2022 ? ?     Progress Note ? ?Chief Complaint  ?Patient presents with  ? Follow-up  ? Hyperlipidemia  ? Hypertension  ? Diabetes  ? ? ? ?Subjective:  ? ?Janice Jennings is a 51 y.o. female, presents to clinic for routine clinic ? ?CKD stage 4 changed nephrology ?Assessment  ?51 y.o. female with CKD stage G4A3 likely secondary to diabetes mellitus type 2 who presents to establish care for CKD management  ? ?Recommendations  ? 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 Will consider adding if eGFR is stable >25 ml/min. Check labs today and again in 2-3 months.  ?-Continue losartan.  ?-We talked a little about potential dialysis and kidney transplant, she says she is not interested at this time--reports she is mentally and physically tired. Encouraged her to do some research on the [facility] website about CKD.  ?-We discussed lifestyle modifications that can help maintain stable kidney function such as maintaining a healthy weight, avoiding NSAIDs, and avoiding foods containing high levels of salt.  ? ?Essential Hypertension ?-slightly above goal in office today ?-follow at next visit and consider increasing dose of carvedilol if we do not add an SGLT-2 inhibitor. ? ?Diabetes Mellitus type 2 ?-managed by PCP, Hgb A1c improved in 2022 compared to prior years. ? ?Return to clinic in 3 months ? ?Horald Chestnut, DO ?Division of Nephrology and Hypertension ?[place] ?11/28/2021 ?10:43 AM ? ? ?Working longer hours and increase work load, been harder to care for her health ?Last A1C was controlled, on insulin 70-72, Not checking sugars lately -   ?Lab Results  ?Component Value Date  ? HGBA1C 7.5 (A) 04/20/2021  ?Ozempic once a week ?Due for foot exam  ? ?CKD - stage 4 - working with nephrologist, BP higher since HCTZ med  was d/c ?HTN on amlodipine, carvedilol, hydralazine 100 mg TID, losartan - plan was to monitor renal function, increase carvedilol if unable to get farxiga started (per nephro note) ? ?Hyperlipidemia: ?Currently treated with zetia and lipitor, pt reports good med compliance ? ?- Denies: Chest pain, shortness of breath, myalgias, claudication ? ? ? ? ? ? ?Current Outpatient Medications:  ?  albuterol (VENTOLIN HFA) 108 (90 Base) MCG/ACT inhaler, Inhale 2 puffs into the lungs every 4 (four) hours as needed for wheezing or shortness of breath., Disp: 1 each, Rfl: 1 ?  amLODipine (NORVASC) 10 MG tablet, Take 1 tablet (10 mg total) by mouth daily., Disp: 90 tablet, Rfl: 3 ?  atorvastatin (LIPITOR) 80 MG tablet, TAKE 1 TABLET BY MOUTH AT  BEDTIME, Disp: 90 tablet, Rfl: 3 ?  carvedilol (COREG) 6.25 MG tablet, TAKE 1 TABLET BY MOUTH  TWICE DAILY WITH A MEAL, Disp: 180 tablet, Rfl: 3 ?  Continuous Blood Gluc Sensor (FREESTYLE LIBRE 2 SENSOR) MISC, PLACE ON ARM AND USE AS DIRECTED, Disp: 2 each, Rfl: 0 ?  cyclobenzaprine (FLEXERIL) 5 MG tablet, TAKE 1 TABLET BY MOUTH THREE TIMES A DAY AS NEEDED FOR MUSCLE SPASMS, Disp: 30 tablet, Rfl: 1 ?  ezetimibe (ZETIA) 10 MG tablet, TAKE 1 TABLET BY MOUTH  DAILY, Disp: 90 tablet, Rfl: 3 ?  ferrous sulfate 325 (65 FE) MG tablet, Take 325 mg by mouth daily with breakfast., Disp: , Rfl:  ?  gabapentin (NEURONTIN) 300 MG capsule,  TAKE 1 CAPSULE BY MOUTH IN  THE MORNING AND 1 CAPSULE  BY MOUTH IN THE AFTERNOON  TAKE 2 CAPSULES BY MOUTH  BEFORE BEDTIME, Disp: 360 capsule, Rfl: 3 ?  hydrALAZINE (APRESOLINE) 100 MG tablet, Take 1 tablet (100 mg total) by mouth 3 (three) times daily., Disp: 270 tablet, Rfl: 3 ?  LANTUS SOLOSTAR 100 UNIT/ML Solostar Pen, INJECT SUBCUTANEOUSLY 60 TO 80 UNITS DAILY, Disp: 75 mL, Rfl: 3 ?  losartan (COZAAR) 100 MG tablet, Take 100 mg by mouth daily., Disp: , Rfl:  ?  Multiple Vitamin (MULTIVITAMIN) tablet, Take 1 tablet by mouth daily., Disp: , Rfl:  ?  PENNSAID 2 %  SOLN, SMARTSIG:1 Pump Topical Twice Daily PRN, Disp: , Rfl:  ?  Semaglutide, 2 MG/DOSE, (OZEMPIC, 2 MG/DOSE,) 8 MG/3ML SOPN, Inject 2 mg into the skin once a week., Disp: 3 mL, Rfl: 3 ?  venlafaxine XR (EFFEXOR-XR) 75 MG 24 hr capsule, TAKE 1 CAPSULE BY MOUTH  DAILY WITH BREAKFAST, Disp: 90 capsule, Rfl: 3 ?  benzonatate (TESSALON) 100 MG capsule, Take 2 capsules (200 mg total) by mouth 2 (two) times daily as needed for cough., Disp: 20 capsule, Rfl: 0 ? ?Patient Active Problem List  ? Diagnosis Date Noted  ? Elevated sedimentation rate 08/01/2021  ? Rheumatoid factor positive 08/01/2021  ? Bilateral wrist pain 08/01/2021  ? Chronic kidney disease, stage IV (severe) ([place]) 04/25/2021  ? Secondary hyperparathyroidism of renal origin ([place]) 10/27/2020  ? Mild intermittent asthma without complication 96/28/3662  ? Hot flashes due to menopause 07/08/2020  ? Osteoarthritis of knee 07/08/2020  ? Pain in joint of left shoulder 07/08/2020  ? Lumbar radiculopathy 11/10/2019  ? Type 2 diabetes mellitus with hyperglycemia, with long-term current use of insulin ([place]) 09/22/2019  ? Insulin dependent type 2 diabetes mellitus, uncontrolled 09/22/2019  ? Anemia in chronic kidney disease 08/31/2019  ? Benign hypertensive kidney disease with chronic kidney disease 08/31/2019  ? Proteinuria 08/31/2019  ? Type 2 diabetes mellitus with diabetic chronic kidney disease ([place]) 08/31/2019  ? Hyperlipidemia associated with type 2 diabetes mellitus ([place]) 08/10/2019  ? Hemoptysis 11/15/2018  ? Class 2 severe obesity with serious comorbidity and body mass index (BMI) of 35.0 to 35.9 in adult [facility]) 09/22/2018  ? Anemia 06/19/2018  ? Diabetic retinopathy of both eyes associated with type 2 diabetes mellitus ([place]) 04/11/2018  ? Essential hypertension 03/07/2015  ? ? ?Past Surgical History:  ?Procedure Laterality Date  ? BREAST REDUCTION SURGERY  2001  ? CESAREAN SECTION    ? COLONOSCOPY WITH PROPOFOL N/A 08/15/2018  ? Procedure: COLONOSCOPY WITH  PROPOFOL;  Surgeon: Jonathon Bellows, MD;  Location: [facility] ENDOSCOPY;  Service: Gastroenterology;  Laterality: N/A;  ? ESOPHAGOGASTRODUODENOSCOPY (EGD) WITH PROPOFOL N/A 08/15/2018  ? Procedure: ESOPHAGOGASTRODUODENOSCOPY (EGD) WITH PROPOFOL;  Surgeon: Jonathon Bellows, MD;  Location: [facility] ENDOSCOPY;  Service: Gastroenterology;  Laterality: N/A;  ? GIVENS CAPSULE STUDY N/A 09/08/2018  ? Procedure: GIVENS CAPSULE STUDY;  Surgeon: Jonathon Bellows, MD;  Location: [facility] ENDOSCOPY;  Service: Gastroenterology;  Laterality: N/A;  ? REDUCTION MAMMAPLASTY Bilateral 2000  ? SHOULDER SURGERY Left   ? TUBAL LIGATION    ? ? ?Family History  ?Problem Relation Age of Onset  ? Diabetes Father   ? Hyperlipidemia Father   ? Hypertension Father   ? Cancer Maternal Aunt 69  ?     leukemia - died  ? Diabetes Maternal Grandmother   ? Cancer Maternal Grandmother   ?     breast cancer  ?  Stroke Maternal Grandmother   ? Breast cancer Maternal Grandmother   ? Diabetes Daughter   ?     Pre-diabetes  ? Diabetes Maternal Grandfather   ? Hyperlipidemia Maternal Grandfather   ? Hypertension Maternal Grandfather   ? Cancer Maternal Aunt 51  ?     breast cancer - died  ? Ovarian cancer Neg Hx   ? Colon cancer Neg Hx   ? ? ?Social History  ? ?Tobacco Use  ? Smoking status: Never  ? Smokeless tobacco: Never  ?Vaping Use  ? Vaping Use: Never used  ?Substance Use Topics  ? Alcohol use: Yes  ?  Alcohol/week: 0.0 standard drinks  ?  Comment: occ  ? Drug use: No  ?  ? ?Allergies  ?Allergen Reactions  ? Hydrocodone Itching  ? ? ?Health Maintenance  ?Topic Date Due  ? HEMOGLOBIN A1C  10/20/2021  ? FOOT EXAM  10/27/2021  ? INFLUENZA VACCINE  05/29/2022  ? OPHTHALMOLOGY EXAM  10/16/2022  ? MAMMOGRAM  01/11/2023  ? TETANUS/TDAP  05/26/2024  ? PAP SMEAR-Modifier  08/23/2024  ? COLONOSCOPY (Pts 45-12yr Insurance coverage will need to be confirmed)  08/15/2028  ? COVID-19 Vaccine  Completed  ? Hepatitis C Screening  Completed  ? HIV Screening  Completed  ? Zoster Vaccines-  Shingrix  Completed  ? HPV VACCINES  Aged Out  ? ? ?Chart Review Today: ?I personally reviewed active problem list, medication list, allergies, family history, social history, health maintenance, notes from l

## 2022-01-30 ENCOUNTER — Encounter: Payer: Self-pay | Admitting: Family Medicine

## 2022-01-30 LAB — COMPLETE METABOLIC PANEL WITH GFR
AG Ratio: 1.1 (calc) (ref 1.0–2.5)
ALT: 12 U/L (ref 6–29)
AST: 14 U/L (ref 10–35)
Albumin: 3.8 g/dL (ref 3.6–5.1)
Alkaline phosphatase (APISO): 76 U/L (ref 37–153)
BUN/Creatinine Ratio: 14 (calc) (ref 6–22)
BUN: 32 mg/dL — ABNORMAL HIGH (ref 7–25)
CO2: 27 mmol/L (ref 20–32)
Calcium: 9.7 mg/dL (ref 8.6–10.4)
Chloride: 108 mmol/L (ref 98–110)
Creat: 2.36 mg/dL — ABNORMAL HIGH (ref 0.50–1.03)
Globulin: 3.5 g/dL (calc) (ref 1.9–3.7)
Glucose, Bld: 82 mg/dL (ref 65–99)
Potassium: 4.9 mmol/L (ref 3.5–5.3)
Sodium: 142 mmol/L (ref 135–146)
Total Bilirubin: 0.5 mg/dL (ref 0.2–1.2)
Total Protein: 7.3 g/dL (ref 6.1–8.1)
eGFR: 24 mL/min/{1.73_m2} — ABNORMAL LOW (ref >=60)

## 2022-01-30 LAB — CBC WITH DIFFERENTIAL/PLATELET
Absolute Monocytes: 442 cells/uL (ref 200–950)
Basophils Absolute: 32 cells/uL (ref 0–200)
Basophils Relative: 0.5 %
Eosinophils Absolute: 128 cells/uL (ref 15–500)
Eosinophils Relative: 2 %
HCT: 38 % (ref 35.0–45.0)
Hemoglobin: 12.7 g/dL (ref 11.7–15.5)
Lymphs Abs: 1830 cells/uL (ref 850–3900)
MCH: 30.6 pg (ref 27.0–33.0)
MCHC: 33.4 g/dL (ref 32.0–36.0)
MCV: 91.6 fL (ref 80.0–100.0)
MPV: 10.6 fL (ref 7.5–12.5)
Monocytes Relative: 6.9 %
Neutro Abs: 3968 cells/uL (ref 1500–7800)
Neutrophils Relative %: 62 %
Platelets: 271 10*3/uL (ref 140–400)
RBC: 4.15 10*6/uL (ref 3.80–5.10)
RDW: 12.6 % (ref 11.0–15.0)
Total Lymphocyte: 28.6 %
WBC: 6.4 10*3/uL (ref 3.8–10.8)

## 2022-01-30 LAB — LIPID PANEL
Cholesterol: 192 mg/dL (ref <200)
HDL: 65 mg/dL (ref >=50)
LDL Cholesterol (Calc): 110 mg/dL (calc) — ABNORMAL HIGH
Non-HDL Cholesterol (Calc): 127 mg/dL (calc) (ref <130)
Total CHOL/HDL Ratio: 3 (calc) (ref <5.0)
Triglycerides: 80 mg/dL (ref <150)

## 2022-01-30 LAB — HEMOGLOBIN A1C
Hgb A1c MFr Bld: 5.8 % of total Hgb — ABNORMAL HIGH (ref <5.7)
Mean Plasma Glucose: 120 mg/dL
eAG (mmol/L): 6.6 mmol/L

## 2022-01-30 NOTE — Telephone Encounter (Signed)
Requested Prescriptions  ?Pending Prescriptions Disp Refills  ?? Continuous Blood Gluc Sensor (FREESTYLE LIBRE 2 SENSOR) MISC [Pharmacy Med Name: FREESTYLE LIBRE_2 SENSOR] 2 each 11  ?  Sig: PLACE ON ARM AND USE AS DIRECTED  ?  ? Endocrinology: Diabetes - Testing Supplies Passed - 01/29/2022  8:38 AM  ?  ?  Passed - Valid encounter within last 12 months  ?  Recent Outpatient Visits   ?      ? Yesterday Essential hypertension  ? [facility], Kristeen Miss, PA-C  ? 4 months ago Viral upper respiratory tract infection  ? [facility] Bo Merino, FNP  ? 5 months ago Physical exam, annual  ? [facility] Bo Merino, FNP  ? 7 months ago Insulin dependent type 2 diabetes mellitus, uncontrolled ([place])  ? [facility] Delsa Grana, PA-C  ? 9 months ago DM type 2 with diabetic peripheral neuropathy ([place])  ? [facility] Kathrine Haddock, NP  ?  ?  ? ?  ?  ?  ? ? ?

## 2022-02-02 ENCOUNTER — Encounter: Payer: Self-pay | Admitting: Family Medicine

## 2022-02-03 ENCOUNTER — Encounter: Payer: Self-pay | Admitting: Family Medicine

## 2022-02-05 ENCOUNTER — Other Ambulatory Visit: Payer: Self-pay | Admitting: Nurse Practitioner

## 2022-02-05 DIAGNOSIS — E1142 Type 2 diabetes mellitus with diabetic polyneuropathy: Secondary | ICD-10-CM

## 2022-02-06 NOTE — Telephone Encounter (Signed)
Requested Prescriptions  ?Pending Prescriptions Disp Refills  ?? OZEMPIC, 2 MG/DOSE, 8 MG/3ML SOPN [Pharmacy Med Name: OZEMPIC  '2MG'$  SOLUTION PEN-INJECTOR  PEN DOSE] 9 mL 3  ?  Sig: INJECT SUBCUTANEOUSLY 2 MG EVERY WEEK  ?  ? Endocrinology:  Diabetes - GLP-1 Receptor Agonists - semaglutide Failed - 02/05/2022 10:48 PM  ?  ?  Failed - HBA1C in normal range and within 180 days  ?  Hgb A1c MFr Bld  ?Date Value Ref Range Status  ?01/29/2022 5.8 (H) <5.7 % of total Hgb Final  ?  Comment:  ?  For someone without known diabetes, a hemoglobin  ?A1c value between 5.7% and 6.4% is consistent with ?prediabetes and should be confirmed with a  ?follow-up test. ?. ?For someone with known diabetes, a value <7% ?indicates that their diabetes is well controlled. A1c ?targets should be individualized based on duration of ?diabetes, age, comorbid conditions, and other ?considerations. ?. ?This assay result is consistent with an increased risk ?of diabetes. ?. ?Currently, no consensus exists regarding use of ?hemoglobin A1c for diagnosis of diabetes for children. ?. ?  ?   ?  ?  Failed - Cr in normal range and within 360 days  ?  Creat  ?Date Value Ref Range Status  ?01/29/2022 2.36 (H) 0.50 - 1.03 mg/dL Final  ? ?Creatinine,U  ?Date Value Ref Range Status  ?10/20/2014 83.0 mg/dL Final  ? ?Creatinine, Urine  ?Date Value Ref Range Status  ?08/10/2019 89 20 - 275 mg/dL Final  ?   ?  ?  Passed - Valid encounter within last 6 months  ?  Recent Outpatient Visits   ?      ? 1 week ago Essential hypertension  ? [facility], Kristeen Miss, PA-C  ? 4 months ago Viral upper respiratory tract infection  ? [facility] Bo Merino, FNP  ? 5 months ago Physical exam, annual  ? [facility] Bo Merino, FNP  ? 7 months ago Insulin dependent type 2 diabetes mellitus, uncontrolled ([place])  ? [facility] Delsa Grana, PA-C  ? 9 months ago DM type 2 with diabetic peripheral  neuropathy ([place])  ? [facility] Kathrine Haddock, NP  ?  ?  ? ?  ?  ?  ? ? ?

## 2022-03-06 LAB — HM DIABETES EYE EXAM

## 2022-03-20 ENCOUNTER — Encounter: Payer: Self-pay | Admitting: Family Medicine

## 2022-03-22 ENCOUNTER — Other Ambulatory Visit: Payer: Self-pay | Admitting: Family Medicine

## 2022-03-22 DIAGNOSIS — E1142 Type 2 diabetes mellitus with diabetic polyneuropathy: Secondary | ICD-10-CM

## 2022-03-22 DIAGNOSIS — N951 Menopausal and female climacteric states: Secondary | ICD-10-CM

## 2022-03-22 MED ORDER — GABAPENTIN 300 MG PO CAPS: ORAL_CAPSULE | ORAL | 3 refills | Status: DC

## 2022-04-16 ENCOUNTER — Other Ambulatory Visit: Payer: Self-pay | Admitting: Family Medicine

## 2022-04-16 DIAGNOSIS — I10 Essential (primary) hypertension: Secondary | ICD-10-CM

## 2022-05-02 ENCOUNTER — Encounter: Payer: Self-pay | Admitting: Family Medicine

## 2022-05-02 ENCOUNTER — Other Ambulatory Visit: Payer: Self-pay | Admitting: Nurse Practitioner

## 2022-05-02 DIAGNOSIS — E1142 Type 2 diabetes mellitus with diabetic polyneuropathy: Secondary | ICD-10-CM

## 2022-05-03 ENCOUNTER — Other Ambulatory Visit: Payer: Self-pay

## 2022-05-03 NOTE — Telephone Encounter (Signed)
Requested Prescriptions  Pending Prescriptions Disp Refills  . OZEMPIC, 2 MG/DOSE, 8 MG/3ML SOPN [Pharmacy Med Name: OZEMPIC 8 MG/3 ML (2 MG/DOSE)] 3 mL 3    Sig: INJECT 2 MG INTO THE SKIN ONCE A WEEK.     Endocrinology:  Diabetes - GLP-1 Receptor Agonists - semaglutide Failed - 05/02/2022 12:02 PM      Failed - HBA1C in normal range and within 180 days    Hgb A1c MFr Bld  Date Value Ref Range Status  01/29/2022 5.8 (H) <5.7 % of total Hgb Final    Comment:    For someone without known diabetes, a hemoglobin  A1c value between 5.7% and 6.4% is consistent with prediabetes and should be confirmed with a  follow-up test. . For someone with known diabetes, a value <7% indicates that their diabetes is well controlled. A1c targets should be individualized based on duration of diabetes, age, comorbid conditions, and other considerations. . This assay result is consistent with an increased risk of diabetes. . Currently, no consensus exists regarding use of hemoglobin A1c for diagnosis of diabetes for children. .          Failed - Cr in normal range and within 360 days    Creat  Date Value Ref Range Status  01/29/2022 2.36 (H) 0.50 - 1.03 mg/dL Final   Creatinine,U  Date Value Ref Range Status  10/20/2014 83.0 mg/dL Final   Creatinine, Urine  Date Value Ref Range Status  08/10/2019 89 20 - 275 mg/dL Final         Passed - Valid encounter within last 6 months    Recent Outpatient Visits          3 months ago Essential hypertension   [facility] Delsa Grana, PA-C   7 months ago Viral upper respiratory tract infection   [place], FNP   8 months ago Physical exam, annual   [place], FNP   10 months ago Insulin dependent type 2 diabetes mellitus, uncontrolled [facility])   [facility] Delsa Grana, PA-C   1 year ago DM type 2 with diabetic peripheral neuropathy  [facility])   [facility] Kathrine Haddock, NP

## 2022-05-18 ENCOUNTER — Telehealth: Payer: 59 | Admitting: Family Medicine

## 2022-05-18 DIAGNOSIS — J40 Bronchitis, not specified as acute or chronic: Secondary | ICD-10-CM

## 2022-05-18 MED ORDER — AZITHROMYCIN 250 MG PO TABS: ORAL_TABLET | ORAL | 0 refills | Status: AC

## 2022-05-18 MED ORDER — BENZONATATE 200 MG PO CAPS: 200.0000 mg | ORAL_CAPSULE | Freq: Two times a day (BID) | ORAL | 0 refills | Status: DC | PRN

## 2022-05-18 NOTE — Progress Notes (Signed)
We are sorry that you are not feeling well.  Here is how we plan to help!  Based on your presentation I believe you most likely have A cough due to bacteria.  When patients have a fever and a productive cough with a change in color or increased sputum production, we are concerned about bacterial bronchitis.  If left untreated it can progress to pneumonia.  If your symptoms do not improve with your treatment plan it is important that you contact your provider.   I have prescribed Azithromyin 250 mg: two tablets now and then one tablet daily for 4 additonal days    In addition you may use A prescription cough medication called Tessalon Perles 115m. You may take 1-2 capsules every 8 hours as needed for your cough.    From your responses in the eVisit questionnaire you describe inflammation in the upper respiratory tract which is causing a significant cough.  This is commonly called Bronchitis and has four common causes:   Allergies Viral Infections Acid Reflux Bacterial Infection Allergies, viruses and acid reflux are treated by controlling symptoms or eliminating the cause. An example might be a cough caused by taking certain blood pressure medications. You stop the cough by changing the medication. Another example might be a cough caused by acid reflux. Controlling the reflux helps control the cough.  USE OF BRONCHODILATOR ("RESCUE") INHALERS: There is a risk from using your bronchodilator too frequently.  The risk is that over-reliance on a medication which only relaxes the muscles surrounding the breathing tubes can reduce the effectiveness of medications prescribed to reduce swelling and congestion of the tubes themselves.  Although you feel brief relief from the bronchodilator inhaler, your asthma may actually be worsening with the tubes becoming more swollen and filled with mucus.  This can delay other crucial treatments, such as oral steroid medications. If you need to use a bronchodilator  inhaler daily, several times per day, you should discuss this with your provider.  There are probably better treatments that could be used to keep your asthma under control.     HOME CARE Only take medications as instructed by your medical team. Complete the entire course of an antibiotic. Drink plenty of fluids and get plenty of rest. Avoid close contacts especially the very young and the elderly Cover your mouth if you cough or cough into your sleeve. Always remember to wash your hands A steam or ultrasonic humidifier can help congestion.   GET HELP RIGHT AWAY IF: You develop worsening fever. You become short of breath You cough up blood. Your symptoms persist after you have completed your treatment plan MAKE SURE YOU  Understand these instructions. Will watch your condition. Will get help right away if you are not doing well or get worse.    Thank you for choosing an e-visit.  Your e-visit answers were reviewed by a board certified advanced clinical practitioner to complete your personal care plan. Depending upon the condition, your plan could have included both over the counter or prescription medications.  Please review your pharmacy choice. Make sure the pharmacy is open so you can pick up prescription now. If there is a problem, you may contact your provider through [employer] have the prescription routed to another pharmacy.  Your safety is important to [place] If you have drug allergies check your prescription carefully.   For the next 24 hours you can use MyChart to ask questions about today's visit, request a non-urgent call back, or  ask for a work or school excuse. You will get an email in the next two days asking about your experience. I hope that your e-visit has been valuable and will speed your recovery.   I have provided 5 minutes of non face to face time during this encounter for chart review and documentation.

## 2022-05-28 ENCOUNTER — Encounter: Payer: Self-pay | Admitting: Family Medicine

## 2022-07-16 LAB — HM DIABETES EYE EXAM

## 2022-07-28 ENCOUNTER — Encounter: Payer: Self-pay | Admitting: Family Medicine

## 2022-07-30 ENCOUNTER — Encounter: Payer: Self-pay | Admitting: Nurse Practitioner

## 2022-07-30 ENCOUNTER — Other Ambulatory Visit: Payer: Self-pay

## 2022-07-30 ENCOUNTER — Ambulatory Visit: Payer: 59 | Admitting: Nurse Practitioner

## 2022-07-30 ENCOUNTER — Other Ambulatory Visit (HOSPITAL_COMMUNITY)
Admission: RE | Admit: 2022-07-30 | Discharge: 2022-07-30 | Disposition: A | Discharge status: Home / Self Care | Payer: 59 | Source: Ambulatory Visit | Attending: Nurse Practitioner | Admitting: Nurse Practitioner

## 2022-07-30 VITALS — BP 138/70 | HR 92 | Temp 98.5°F | Resp 16 | Ht 64.0 in | Wt 204.8 lb

## 2022-07-30 DIAGNOSIS — N898 Other specified noninflammatory disorders of vagina: Secondary | ICD-10-CM

## 2022-07-30 MED ORDER — FLUCONAZOLE 150 MG PO TABS: 150.0000 mg | ORAL_TABLET | ORAL | 0 refills | Status: DC | PRN

## 2022-07-30 MED ORDER — NYSTATIN 100000 UNIT/GM EX CREA: 1.0000 | TOPICAL_CREAM | Freq: Two times a day (BID) | CUTANEOUS | 0 refills | Status: AC

## 2022-07-30 NOTE — Progress Notes (Signed)
   BP 138/70   Pulse 92   Temp 98.5 F (36.9 C) (Oral)   Resp 16   Ht '5\' 4"'$  (1.626 m)   Wt 204 lb 12.8 oz (92.9 kg)   LMP 05/29/2018   SpO2 98%   BMI 35.15 kg/m    Subjective:    Patient ID: Janice Jennings, female    DOB: [birth year], 51 y.o.   MRN: [id number]  HPI: Janice Jennings is a 51 y.o. female  Chief Complaint  Patient presents with   Vaginitis   Vaginal discharge:  Patient reports that she recently had sex and now has a vaginal discharge.  She is unsure of the color, she says it is very itchy.  She says she has had symptoms for a few days.  She denies any pain, urinary complaints or bleeding. Will get vaginal swab and start treatment for yeast infection.   Relevant past medical, surgical, family and social history reviewed and updated as indicated. Interim medical history since our last visit reviewed. Allergies and medications reviewed and updated.  Review of Systems  Constitutional: Negative for fever or weight change.  Respiratory: Negative for cough and shortness of breath.   Cardiovascular: Negative for chest pain or palpitations.  Gastrointestinal: Negative for abdominal pain, no bowel changes.  GU: positive for vaginal discharge Musculoskeletal: Negative for gait problem or joint swelling.  Skin: Negative for rash.  Neurological: Negative for dizziness or headache.  No other specific complaints in a complete review of systems (except as listed in HPI above).      Objective:    BP 138/70   Pulse 92   Temp 98.5 F (36.9 C) (Oral)   Resp 16   Ht '5\' 4"'$  (1.626 m)   Wt 204 lb 12.8 oz (92.9 kg)   LMP 05/29/2018   SpO2 98%   BMI 35.15 kg/m   Wt Readings from Last 3 Encounters:  07/30/22 204 lb 12.8 oz (92.9 kg)  01/29/22 212 lb 4.8 oz (96.3 kg)  12/20/21 206 lb 12.7 oz (93.8 kg)    Physical Exam  Constitutional: Patient appears well-developed and well-nourished. Obese  No distress.  HEENT: head atraumatic, normocephalic, pupils equal and reactive to  light, neck supple Cardiovascular: Normal rate, regular rhythm and normal heart sounds.  No murmur heard. No BLE edema. Pulmonary/Chest: Effort normal and breath sounds normal. No respiratory distress. Abdominal: Soft.  There is no tenderness. Psychiatric: Patient has a normal mood and affect. behavior is normal. Judgment and thought content normal.  Results for orders placed or performed in visit on 07/23/22  HM DIABETES EYE EXAM  Result Value Ref Range   HM Diabetic Eye Exam Retinopathy (A) No Retinopathy      Assessment & Plan:   Problem List Items Addressed This Visit   None Visit Diagnoses     Vaginal discharge    -  Primary   Obtain vaginal swab, will send in diflucan and nystatin cream   Relevant Medications   fluconazole (DIFLUCAN) 150 MG tablet   nystatin cream (MYCOSTATIN)   Other Relevant Orders   Cervicovaginal ancillary only        Follow up plan: Return if symptoms worsen or fail to improve.

## 2022-08-01 LAB — CERVICOVAGINAL ANCILLARY ONLY
Bacterial Vaginitis (gardnerella): NEGATIVE
Candida Glabrata: NEGATIVE
Candida Vaginitis: POSITIVE — AB
Chlamydia: NEGATIVE
Comment: NEGATIVE
Comment: NEGATIVE
Comment: NEGATIVE
Comment: NEGATIVE
Comment: NEGATIVE
Comment: NORMAL
Neisseria Gonorrhea: NEGATIVE
Trichomonas: NEGATIVE

## 2022-08-13 ENCOUNTER — Other Ambulatory Visit: Payer: Self-pay | Admitting: Family Medicine

## 2022-08-13 DIAGNOSIS — E1169 Type 2 diabetes mellitus with other specified complication: Secondary | ICD-10-CM

## 2022-08-14 ENCOUNTER — Other Ambulatory Visit: Payer: Self-pay | Admitting: Family Medicine

## 2022-08-14 DIAGNOSIS — I10 Essential (primary) hypertension: Secondary | ICD-10-CM

## 2022-08-15 NOTE — Telephone Encounter (Signed)
Requested Prescriptions  Pending Prescriptions Disp Refills  . amLODipine (NORVASC) 10 MG tablet [Pharmacy Med Name: amLODIPine Besylate 10 MG Oral Tablet] 90 tablet 0    Sig: TAKE 1 TABLET BY MOUTH  DAILY     Cardiovascular: Calcium Channel Blockers 2 Passed - 08/14/2022  8:04 PM      Passed - Last BP in normal range    BP Readings from Last 1 Encounters:  07/30/22 138/70         Passed - Last Heart Rate in normal range    Pulse Readings from Last 1 Encounters:  07/30/22 92         Passed - Valid encounter within last 6 months    Recent Outpatient Visits          2 weeks ago Vaginal discharge   [place], FNP   6 months ago Essential hypertension   [facility] Delsa Grana, PA-C   10 months ago Viral upper respiratory tract infection   [facility] Bo Merino, FNP   11 months ago Physical exam, annual   [place], FNP   1 year ago Insulin dependent type 2 diabetes mellitus, uncontrolled [facility])   [facility] Delsa Grana, [place]

## 2022-08-20 LAB — HEMOGLOBIN A1C: Hemoglobin A1C: 6.6

## 2022-08-20 LAB — COMPREHENSIVE METABOLIC PANEL: eGFR: 26

## 2022-08-20 LAB — MICROALBUMIN / CREATININE URINE RATIO: Microalb Creat Ratio: 568.5

## 2022-08-25 ENCOUNTER — Other Ambulatory Visit: Payer: Self-pay | Admitting: Family Medicine

## 2022-08-25 DIAGNOSIS — I10 Essential (primary) hypertension: Secondary | ICD-10-CM

## 2022-08-27 NOTE — Telephone Encounter (Signed)
Lvm informing pt that prescriptions has been sent to pharmacy and for her to return call to sch appt

## 2022-08-27 NOTE — Telephone Encounter (Signed)
Refills given but needs appt for regular f/u

## 2022-09-01 ENCOUNTER — Encounter: Payer: Self-pay | Admitting: Family Medicine

## 2022-09-11 ENCOUNTER — Ambulatory Visit (INDEPENDENT_AMBULATORY_CARE_PROVIDER_SITE_OTHER): Payer: 59

## 2022-09-11 ENCOUNTER — Other Ambulatory Visit: Payer: Self-pay

## 2022-09-11 DIAGNOSIS — Z23 Encounter for immunization: Secondary | ICD-10-CM | POA: Diagnosis not present

## 2022-09-11 DIAGNOSIS — E1142 Type 2 diabetes mellitus with diabetic polyneuropathy: Secondary | ICD-10-CM

## 2022-09-11 MED ORDER — OZEMPIC (2 MG/DOSE) 8 MG/3ML ~~LOC~~ SOPN: PEN_INJECTOR | SUBCUTANEOUS | 3 refills | Status: DC

## 2022-09-11 NOTE — Progress Notes (Unsigned)
Name: Adi Seales   MRN: [id number]    DOB: 1971-03-26   Date:09/11/2022       Progress Note  No chief complaint on file.    Subjective:   Janice Jennings is a 51 y.o. female, presents to clinic for f/up on chronic conditions, multiple dx uncontrolled, last OV was in April, she was instructed to return in 3 months, but it has been 7 months   CKD stage 4 changed nephrology Assessment  51 y.o. 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 Will consider adding if eGFR is stable >25 ml/min. Check labs today and again in 2-3 months.  -Continue losartan.  -We talked a little about potential dialysis and kidney transplant, she says she is not interested at this time--reports she is mentally and physically tired. Encouraged her to do some research on the [facility] website about CKD.  -We discussed lifestyle modifications that can help maintain stable kidney function such as maintaining a healthy weight, avoiding NSAIDs, and avoiding foods containing high levels of salt.   Essential Hypertension -slightly above goal in office today -follow at next visit and consider increasing dose of carvedilol if we do not add an SGLT-2 inhibitor.  Diabetes Mellitus type 2 -managed by PCP, Hgb A1c improved in 2022 compared to prior years.  Return to clinic in 3 months  Horald Chestnut, DO Division of Nephrology and Hypertension [place] 11/28/2021 10:43 AM   IDDM:  poor compliance and f/up Working longer hours and increase work load, been harder to care for her health Last A1C was controlled, on insulin 70-72, Not checking sugars lately -   Lab Results  Component Value Date   HGBA1C 6.6 08/20/2022  Ozempic once a  week   CKD - stage 4 - working with nephrologist, BP higher since HCTZ med was d/c HTN on amlodipine, carvedilol, hydralazine 100 mg TID, losartan - plan was to monitor renal function, increase carvedilol if unable to get farxiga started (per nephro note)  Hyperlipidemia: Currently treated with zetia and lipitor27m , pt reports good med compliance Lab Results  Component Value Date   CHOL 192 01/29/2022   HDL 65 01/29/2022   LDLCALC 110 (H) 01/29/2022   TRIG 80 01/29/2022   CHOLHDL 3.0 01/29/2022   - Denies: Chest pain, shortness of breath, myalgias, claudication  Hypertension:  Currently managed on coreg, amlodipine, hydralazine, losartan Pt reports *** med compliance and denies any SE.   Blood pressure today is *** controlled. BP Readings from Last 3 Encounters:  07/30/22 138/70  01/29/22 (!) 160/82  12/20/21 (!) 161/100   Pt denies CP, SOB, exertional sx, LE edema, palpitation, Ha's, visual disturbances, lightheadedness, hypotension, syncope. Dietary efforts for BP?  ***       Current Outpatient Medications:    albuterol (VENTOLIN HFA) 108 (90 Base) MCG/ACT inhaler, Inhale 2 puffs into the lungs every 4 (four) hours as needed for wheezing or shortness of breath., Disp: 1 each, Rfl: 1   amLODipine (NORVASC) 10 MG tablet, TAKE 1 TABLET BY MOUTH DAILY, Disp: 90 tablet, Rfl: 0   atorvastatin (LIPITOR) 80 MG tablet, TAKE 1 TABLET BY MOUTH AT  BEDTIME, Disp: 90 tablet, Rfl: 3   benzonatate (TESSALON) 200 MG capsule, Take 1  capsule (200 mg total) by mouth 2 (two) times daily as needed for cough., Disp: 20 capsule, Rfl: 0   carvedilol (COREG) 6.25 MG tablet, TAKE 1 TABLET BY MOUTH  TWICE DAILY WITH A MEAL, Disp: 180 tablet, Rfl: 3   Continuous Blood Gluc Sensor (FREESTYLE LIBRE 2 SENSOR) MISC, PLACE ON ARM AND USE AS DIRECTED, Disp: 2 each, Rfl: 11   cyclobenzaprine (FLEXERIL) 5 MG tablet, TAKE 1 TABLET BY MOUTH THREE TIMES A DAY AS NEEDED FOR MUSCLE SPASMS, Disp: 30 tablet, Rfl:  1   ezetimibe (ZETIA) 10 MG tablet, TAKE 1 TABLET BY MOUTH  DAILY, Disp: 90 tablet, Rfl: 3   ferrous sulfate 325 (65 FE) MG tablet, Take 325 mg by mouth daily with breakfast., Disp: , Rfl:    fluconazole (DIFLUCAN) 150 MG tablet, Take 1 tablet (150 mg total) by mouth every 3 (three) days as needed (for vaginal itching/yeast infection sx)., Disp: 2 tablet, Rfl: 0   gabapentin (NEURONTIN) 300 MG capsule, TAKE 1 CAPSULE BY MOUTH IN  THE MORNING AND 1 CAPSULE  BY MOUTH IN THE AFTERNOON  TAKE 2 CAPSULES BY MOUTH  BEFORE BEDTIME, Disp: 360 capsule, Rfl: 3   hydrALAZINE (APRESOLINE) 100 MG tablet, TAKE 1 TABLET BY MOUTH 3  TIMES DAILY, Disp: 270 tablet, Rfl: 0   Insulin Pen Needle 32G X 4 MM MISC, Use as directed with insulin daily (Patient not taking: Reported on 07/30/2022), Disp: 100 each, Rfl: 1   JARDIANCE 10 MG TABS tablet, Take 10 mg by mouth daily., Disp: , Rfl:    LANTUS SOLOSTAR 100 UNIT/ML Solostar Pen, INJECT SUBCUTANEOUSLY 60 TO 80 UNITS DAILY, Disp: 75 mL, Rfl: 3   losartan (COZAAR) 100 MG tablet, Take 100 mg by mouth daily., Disp: , Rfl:    Multiple Vitamin (MULTIVITAMIN) tablet, Take 1 tablet by mouth daily., Disp: , Rfl:    nystatin cream (MYCOSTATIN), Apply 1 Application topically 2 (two) times daily., Disp: 30 g, Rfl: 0   PENNSAID 2 % SOLN, SMARTSIG:1 Pump Topical Twice Daily PRN, Disp: , Rfl:    Semaglutide, 2 MG/DOSE, (OZEMPIC, 2 MG/DOSE,) 8 MG/3ML SOPN, INJECT 2 MG INTO THE SKIN ONCE A WEEK., Disp: 3 mL, Rfl: 3   venlafaxine XR (EFFEXOR-XR) 75 MG 24 hr capsule, TAKE 1 CAPSULE BY MOUTH  DAILY WITH BREAKFAST, Disp: 90 capsule, Rfl: 3  Patient Active Problem List   Diagnosis Date Noted   Elevated sedimentation rate 08/01/2021   Rheumatoid factor positive 08/01/2021   Bilateral wrist pain 08/01/2021   Chronic kidney disease, stage IV (severe) ([place]) 04/25/2021   Secondary hyperparathyroidism of renal origin ([place]) 10/27/2020   Mild intermittent asthma without complication 67/89/3810    Hot flashes due to menopause 07/08/2020   Osteoarthritis of knee 07/08/2020   Pain in joint of left shoulder 07/08/2020   Lumbar radiculopathy 11/10/2019   Type 2 diabetes mellitus with hyperglycemia, with long-term current use of insulin ([place]) 09/22/2019   Insulin dependent type 2 diabetes mellitus, uncontrolled 09/22/2019   Anemia in chronic kidney disease 08/31/2019   Benign hypertensive kidney disease with chronic kidney disease 08/31/2019   Proteinuria 08/31/2019   Type 2 diabetes mellitus with diabetic chronic kidney disease ([place]) 08/31/2019   Hyperlipidemia associated with type 2 diabetes mellitus ([place]) 08/10/2019   Hemoptysis 11/15/2018   Class 2 severe obesity with serious comorbidity and body mass index (BMI) of 35.0 to 35.9 in adult ([place]) 09/22/2018   Anemia 06/19/2018   Diabetic retinopathy of both eyes associated with type  2 diabetes mellitus ([place]) 04/11/2018   Essential hypertension 03/07/2015    Past Surgical History:  Procedure Laterality Date   BREAST REDUCTION SURGERY  2001   CESAREAN SECTION     COLONOSCOPY WITH PROPOFOL N/A 08/15/2018   Procedure: COLONOSCOPY WITH PROPOFOL;  Surgeon: Jonathon Bellows, MD;  Location[facility] ENDOSCOPY;  Service: Gastroenterology;  Laterality: N/A;   ESOPHAGOGASTRODUODENOSCOPY (EGD) WITH PROPOFOL N/A 08/15/2018   Procedure: ESOPHAGOGASTRODUODENOSCOPY (EGD) WITH PROPOFOL;  Surgeon: Jonathon Bellows, MD;  Location: [facility] ENDOSCOPY;  Service: Gastroenterology;  Laterality: N/A;   GIVENS CAPSULE STUDY N/A 09/08/2018   Procedure: GIVENS CAPSULE STUDY;  Surgeon: Jonathon Bellows, MD;  Location: [facility] ENDOSCOPY;  Service: Gastroenterology;  Laterality: N/A;   REDUCTION MAMMAPLASTY Bilateral 2000   SHOULDER SURGERY Left    TUBAL LIGATION      Family History  Problem Relation Age of Onset   Diabetes Father    Hyperlipidemia Father    Hypertension Father    Cancer Maternal Aunt 92       leukemia - died   Diabetes Maternal Grandmother    Cancer Maternal  Grandmother        breast cancer   Stroke Maternal Grandmother    Breast cancer Maternal Grandmother    Diabetes Daughter        Pre-diabetes   Diabetes Maternal Grandfather    Hyperlipidemia Maternal Grandfather    Hypertension Maternal Grandfather    Cancer Maternal Aunt 50       breast cancer - died   Ovarian cancer Neg Hx    Colon cancer Neg Hx     Social History   Tobacco Use   Smoking status: Never   Smokeless tobacco: Never  Vaping Use   Vaping Use: Never used  Substance Use Topics   Alcohol use: Yes    Alcohol/week: 0.0 standard drinks of alcohol    Comment: occ   Drug use: No     Allergies  Allergen Reactions   Hydrocodone Itching    Health Maintenance  Topic Date Due   MAMMOGRAM  01/11/2023   FOOT EXAM  01/30/2023   HEMOGLOBIN A1C  02/19/2023   OPHTHALMOLOGY EXAM  07/17/2023   Diabetic kidney evaluation - GFR measurement  08/21/2023   Diabetic kidney evaluation - Urine ACR  08/21/2023   TETANUS/TDAP  05/26/2024   PAP SMEAR-Modifier  08/23/2024   COLONOSCOPY (Pts 45-68yr Insurance coverage will need to be confirmed)  08/15/2028   INFLUENZA VACCINE  Completed   COVID-19 Vaccine  Completed   Hepatitis C Screening  Completed   HIV Screening  Completed   Zoster Vaccines- Shingrix  Completed   HPV VACCINES  Aged Out    Chart Review Today: I personally reviewed active problem list, medication list, allergies, family history, social history, health maintenance, notes from last encounter, lab results, imaging with the patient/caregiver today. Specialist labs and OV notes reviewed through care everywhere today   Review of Systems  Constitutional: Negative.   HENT: Negative.    Eyes: Negative.   Respiratory: Negative.    Cardiovascular: Negative.   Gastrointestinal: Negative.   Endocrine: Negative.   Genitourinary: Negative.   Musculoskeletal: Negative.   Skin: Negative.   Allergic/Immunologic: Negative.   Neurological: Negative.   Hematological:  Negative.   Psychiatric/Behavioral: Negative.    All other systems reviewed and are negative.    Objective:   There were no vitals filed for this visit.   There is no height or weight on file to calculate BMI.  Physical  Exam Constitutional:      General: She is not in acute distress.    Appearance: Normal appearance. She is obese. She is not ill-appearing, toxic-appearing or diaphoretic.  HENT:     Head: Normocephalic and atraumatic.     Right Ear: External ear normal.     Left Ear: External ear normal.     Nose: Nose normal.  Eyes:     General: No scleral icterus.       Right eye: No discharge.        Left eye: No discharge.     Conjunctiva/sclera: Conjunctivae normal.  Cardiovascular:     Rate and Rhythm: Normal rate and regular rhythm.     Pulses: Normal pulses.     Heart sounds: Normal heart sounds. No murmur heard.    No friction rub.  Pulmonary:     Effort: Pulmonary effort is normal. No respiratory distress.     Breath sounds: Normal breath sounds. No stridor. No wheezing or rales.  Abdominal:     General: Bowel sounds are normal.     Palpations: Abdomen is soft.  Skin:    General: Skin is warm and dry.     Coloration: Skin is not pale.     Findings: No lesion or rash.  Neurological:     Mental Status: She is alert. Mental status is at baseline.     Gait: Gait normal.  Psychiatric:        Mood and Affect: Mood normal.        Behavior: Behavior normal.     Diabetic Foot Exam - Simple   No data filed        Assessment & Plan:   1. Essential hypertension Uncontrolled today, reading repeated, elevated due to change in BP meds that nephrology is managing Pt is asx, encouraged her to continue DASH/lifestyle efforts and keep f/up with nephrology Labs and nephrology plan reviewed They will increase carvedilol if unable to get her on farxiga - COMPLETE METABOLIC PANEL WITH GFR  2. Class 2 severe obesity with serious comorbidity and body mass index (BMI)  of 35.0 to 35.9 in adult, unspecified obesity type ([place]) Wt Readings from Last 5 Encounters:  07/30/22 204 lb 12.8 oz (92.9 kg)  01/29/22 212 lb 4.8 oz (96.3 kg)  12/20/21 206 lb 12.7 oz (93.8 kg)  08/23/21 206 lb 14.4 oz (93.8 kg)  08/01/21 208 lb 6.4 oz (94.5 kg)   BMI Readings from Last 5 Encounters:  07/30/22 35.15 kg/m  01/29/22 36.44 kg/m  12/20/21 34.41 kg/m  08/23/21 34.43 kg/m  08/01/21 34.68 kg/m   Weight increase slightly - Hemoglobin A1c - COMPLETE METABOLIC PANEL WITH GFR - Lipid panel  3. Hyperlipidemia associated with type 2 diabetes mellitus ([place]) On statin and zetia, has been well controlled in the past - COMPLETE METABOLIC PANEL WITH GFR - Lipid panel  4. Type 2 diabetes mellitus with hyperglycemia, with long-term current use of insulin (HCC) Not checking sugars dosing insulin 70-72, she has freestyle meter, encouraged her to monitor sugars Foot exam done today - Hemoglobin A1c - COMPLETE METABOLIC PANEL WITH GFR - Lipid panel - Insulin Pen Needle 32G X 4 MM MISC; Use as directed with insulin daily  Dispense: 100 each; Refill: 1  5. Anemia in stage 4 chronic kidney disease (HCC) [place], on iron - COMPLETE METABOLIC PANEL WITH GFR - CBC with Differential/Platelet  6. Stage 4 chronic kidney disease [facility]) Per nephrology Last OV note and labs reviewed - COMPLETE  METABOLIC PANEL WITH GFR  7. Encounter for medication monitoring  - Hemoglobin A1c - COMPLETE METABOLIC PANEL WITH GFR - Lipid panel - CBC with Differential/Platelet   No follow-ups on file.   Delsa Grana, PA-C 09/11/22 5:41 PM

## 2022-09-13 ENCOUNTER — Encounter: Payer: Self-pay | Admitting: Family Medicine

## 2022-09-13 ENCOUNTER — Ambulatory Visit (INDEPENDENT_AMBULATORY_CARE_PROVIDER_SITE_OTHER): Payer: 59 | Admitting: Family Medicine

## 2022-09-13 VITALS — BP 166/92 | HR 84 | Temp 98.2°F | Resp 16 | Ht 64.0 in | Wt 208.0 lb

## 2022-09-13 DIAGNOSIS — E1169 Type 2 diabetes mellitus with other specified complication: Secondary | ICD-10-CM

## 2022-09-13 DIAGNOSIS — N2581 Secondary hyperparathyroidism of renal origin: Secondary | ICD-10-CM

## 2022-09-13 DIAGNOSIS — Z5181 Encounter for therapeutic drug level monitoring: Secondary | ICD-10-CM

## 2022-09-13 DIAGNOSIS — J4520 Mild intermittent asthma, uncomplicated: Secondary | ICD-10-CM

## 2022-09-13 DIAGNOSIS — N951 Menopausal and female climacteric states: Secondary | ICD-10-CM

## 2022-09-13 DIAGNOSIS — I10 Essential (primary) hypertension: Secondary | ICD-10-CM

## 2022-09-13 DIAGNOSIS — Z794 Long term (current) use of insulin: Secondary | ICD-10-CM

## 2022-09-13 DIAGNOSIS — D631 Anemia in chronic kidney disease: Secondary | ICD-10-CM

## 2022-09-13 DIAGNOSIS — E785 Hyperlipidemia, unspecified: Secondary | ICD-10-CM

## 2022-09-13 DIAGNOSIS — N184 Chronic kidney disease, stage 4 (severe): Secondary | ICD-10-CM | POA: Diagnosis not present

## 2022-09-13 DIAGNOSIS — E1142 Type 2 diabetes mellitus with diabetic polyneuropathy: Secondary | ICD-10-CM

## 2022-09-13 DIAGNOSIS — E1165 Type 2 diabetes mellitus with hyperglycemia: Secondary | ICD-10-CM

## 2022-09-13 DIAGNOSIS — Z6835 Body mass index (BMI) 35.0-35.9, adult: Secondary | ICD-10-CM

## 2022-09-13 MED ORDER — OZEMPIC (2 MG/DOSE) 8 MG/3ML ~~LOC~~ SOPN: PEN_INJECTOR | SUBCUTANEOUS | 3 refills | Status: DC

## 2022-09-13 MED ORDER — CLONIDINE 0.1 MG/24HR TD PTWK: 0.1000 mg | MEDICATED_PATCH | TRANSDERMAL | 0 refills | Status: DC

## 2022-09-13 MED ORDER — SEMAGLUTIDE (1 MG/DOSE) 4 MG/3ML ~~LOC~~ SOPN: 1.0000 mg | PEN_INJECTOR | SUBCUTANEOUS | 0 refills | Status: AC

## 2022-09-13 MED ORDER — CARVEDILOL 6.25 MG PO TABS: 12.5000 mg | ORAL_TABLET | Freq: Two times a day (BID) | ORAL | Status: DC

## 2022-09-13 NOTE — Assessment & Plan Note (Signed)
Per nephrology, her labs were reviewed, PTH high

## 2022-09-13 NOTE — Assessment & Plan Note (Signed)
Last lipid panel was about 7 months ago and LDL was increased She should be taking Zetia and Lipitor 80 mg  Lab Results  Component Value Date   CHOL 192 01/29/2022   CHOL 154 03/14/2021   CHOL 126 08/10/2019   Lab Results  Component Value Date   HDL 65 01/29/2022   HDL 47 (L) 03/14/2021   HDL 40 (L) 08/10/2019   Lab Results  Component Value Date   LDLCALC 110 (H) 01/29/2022   LDLCALC 83 03/14/2021   LDLCALC 66 08/10/2019   Lab Results  Component Value Date   TRIG 80 01/29/2022   TRIG 137 03/14/2021   TRIG 123 08/10/2019   Hopefully improved med compliance, will check lipids next OV

## 2022-09-13 NOTE — Assessment & Plan Note (Signed)
Last CBC was April and Jan 2023 - no anemia On iron supplement, not requiring hematology currently Hemoglobin  Date Value Ref Range Status  01/29/2022 12.7 11.7 - 15.5 g/dL Final  03/14/2021 11.4 (L) 11.7 - 15.5 g/dL Final  04/10/2019 11.6 (L) 11.7 - 15.5 g/dL Final  11/15/2018 10.4 (L) 12.0 - 15.0 g/dL Final  03/04/2018 10.1 (L) 11.1 - 15.9 g/dL Final   Lab Results  Component Value Date   IRON 67 03/14/2021   TIBC 302 03/14/2021   FERRITIN 33 03/14/2021

## 2022-09-13 NOTE — Assessment & Plan Note (Signed)
Last eGFR 26 She is due to f/up with nephrology in 6 months - but at that time they will not be in network They have adjusted bp meds this year and added jardiance PTH high, urine microalbumin/scr ratio high Advised to work on low salt diet BP uncontrolled - meds adjusted today - may ask nephrology to see her sooner so she is not lost to f/up again

## 2022-09-13 NOTE — Assessment & Plan Note (Signed)
Currently managed with ozempic 2 mg weekly, lantus 40-80 once daily, and nephro added on jardiance 10 Recent improved glycemic control, A1C has been at goal all year, she was able to decrease insulin dose However pharmacy did not have ozempic available for the past 4 weeks and she's had to give more insulin to cover Overall its an improvement that she is even checking her blood sugars Refills of ozempic 1 mg pen and 2 mg pen sent to [facility] mail order pharmacy -if she can get the 1 mg filled she will restart that for few weeks and then increase back up to 2 mg when it is available She has improved her glycemic monitoring and med adjustments -continue Lantus titration based on fasting blood sugar readings  She had improved microalbuminuria on Jardiance and would like to continue this medication -she may have to stop seeing her current nephrologist due to insurance changes next year but we can help continue her med refills. 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

## 2022-09-13 NOTE — Progress Notes (Signed)
Name: Janice Jennings   MRN: [id number]    DOB: 1971-08-04   Date:09/13/2022       Progress Note  Chief Complaint  Patient presents with   Follow-up   Hypertension   Diabetes   Hyperlipidemia     Subjective:   Janice Jennings is a 51 y.o. female, presents to clinic for    Janice Jennings is a 51 y.o. female, presents to clinic for f/up on chronic conditions, multiple dx uncontrolled, last OV was in April, she was instructed to return in 3 months, but it has been 7 months   CKD stage 4, eGFR 26, last OV note and labs all reviewed: ASSESSMENT/PLAN: [name] is a 51 y.o. year old patient with  1. Chronic renal disease, stage 4, severely decreased glomerular filtration rate (GFR) between 15-29 mL/min/1.73 square meter (CMS-HCC)  2. Controlled type 2 diabetes mellitus with complication, with long-term current use of insulin (CMS-HCC)  3. Essential hypertension  4. Anemia due to stage 4 chronic kidney disease (CMS-HCC)  5. Secondary hyperparathyroidism of renal origin (CMS-HCC)   1. CKD IV (G4/A3). Likely 2/2 DKD. 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 She has thought about transplant and if K/P is an option, she would consider, but if K/P not an option she would not consider. BL Cr: 2.2-2.4 and most recently 2.2 Proteinuria/Albuminuria: 568 mg/gm on UACR 07/2022 KFRE: KFRE 2-yr risk 16% and 5-yr risk 41% (improved from last visit) - continue to avoid nephrotoxins, including NSAIDs - on max dose losartan - on SGLT2i, but need to work to see if she can stay on it since insurance is changing coverage next year - max dose of gabapentin for her in 300 bid, which is what she is currently taking - will plan to inquire with transplant as we get closer to eGFR 20 about whether she is K/P candidate  2. HTN. BP slightly above goal - continue on amlodipine 10, carvedilol 6.25 bid, amlodipine 10, hydralazine 100 bid - also on  empagliflozin  3. CKD-MBD. PTH was up a little, Ca and Phos good - CTM - PCP will check her vit d soon, but not currently on supplement except with MVI  4. Anemia. Hgb good, not requiring ESAs - on ferrous sulfate daily, managed by PCP  5. Electrolytes. K and CO2 in range  6. Prevention. On statin and ezetimibe  7. DM2.  - continue on on ozempic, SGLT2i, lantus 30-50U depending on sugars - manages with PCP - consider terzepitide - I have messaged our CKD pharmacist to see if she can help [place] figure out the SGLT2i issue since I think it is important for her to stay on it  [name] will follow up in Return in about 6 months (around 02/26/2023) for Next scheduled follow up, Labs. or sooner as needed.   The patient will need BMP, Phos/PTH, UACR, A1c within 4 weeks prior to next visit.    IDDM:  poor f/up, was uncontrolled, A1C 7.5 last year June, then she f/up April (she has been asked to come for IDDM f/up every 3 months) A1C in April improved to 5.8 doing insulin 70-72 at that time,  since April she was able to decrease basal insulin dose  ~40 units,  Ozempic once a week 2 mg dose for over a year, out for 4 weeks - higher sugars having to bump up insulin Has been doing 2 mg since last year pharmacy doesn't have supply but  she wants 1 mg Also new on Jardiance 10 since June this year Denies: Polyuria, polydipsia, vision changes, neuropathy, hypoglycemia Recent pertinent labs: Lab Results  Component Value Date   HGBA1C 6.6 08/20/2022   HGBA1C 5.8 (H) 01/29/2022   HGBA1C 7.5 (A) 04/20/2021   Lab Results  Component Value Date   MICROALBUR 38.7 08/10/2019   LDLCALC 110 (H) 01/29/2022   CREATININE 2.36 (H) 01/29/2022   Standard of care and health maintenance: Urine Microalbumin:  done per nephro- previously abstracted to chart when prepping note/chart Foot exam:  utd DM eye exam:  utd - retinopathy ACEI/ARB:  losartan Statin:  lipitor 80   CKD - stage 4 - working  with nephrologist, BP higher since HCTZ med was d/c earlier this year HTN on amlodipine, carvedilol, hydralazine 100 mg TID, losartan Last eGFR per nephrology and care everywhere was 26 On jardiance 10 mg Albumin/Cr ration decreased from 1183.6 to 586.5 after adding SGLT2i ARB at max dose  Hyperlipidemia: Currently treated with zetia and lipitor 58m , pt reports good med compliance Last lipids were not well controlled - unclear if she has been compliant with statin Lab Results  Component Value Date   CHOL 192 01/29/2022   HDL 65 01/29/2022   LDLCALC 110 (H) 01/29/2022   TRIG 80 01/29/2022   CHOLHDL 3.0 01/29/2022  - Denies: Chest pain, shortness of breath, myalgias, claudication  Hypertension:  Currently managed on coreg, amlodipine, hydralazine, losartan Pt reports good med compliance and denies any SE.   BP high here again today, at home runs high 160's She is experiencing HA's She denies CP, SOB, LE edema, palpitations BP Readings from Last 6 Encounters:  09/13/22 (!) 166/92  07/30/22 138/70  01/29/22 (!) 160/82  12/20/21 (!) 161/100  08/23/21 128/82  08/01/21 139/81  Pt denies CP, SOB, exertional sx, LE edema, palpitation, visual disturbances, lightheadedness, hypotension, syncope.  Diabetic neuropathy - on max renal dose of gabapentin per nephro  Pt at end of visit states menopausal sx/hot flashes and palpitations have gotten worse, on effexor 75 mg   Current Outpatient Medications:    albuterol (VENTOLIN HFA) 108 (90 Base) MCG/ACT inhaler, Inhale 2 puffs into the lungs every 4 (four) hours as needed for wheezing or shortness of breath., Disp: 1 each, Rfl: 1   amLODipine (NORVASC) 10 MG tablet, TAKE 1 TABLET BY MOUTH DAILY, Disp: 90 tablet, Rfl: 0   atorvastatin (LIPITOR) 80 MG tablet, TAKE 1 TABLET BY MOUTH AT  BEDTIME, Disp: 90 tablet, Rfl: 3   Continuous Blood Gluc Sensor (FREESTYLE LIBRE 2 SENSOR) MISC, PLACE ON ARM AND USE AS DIRECTED, Disp: 2 each, Rfl: 11    cyclobenzaprine (FLEXERIL) 5 MG tablet, TAKE 1 TABLET BY MOUTH THREE TIMES A DAY AS NEEDED FOR MUSCLE SPASMS, Disp: 30 tablet, Rfl: 1   ezetimibe (ZETIA) 10 MG tablet, TAKE 1 TABLET BY MOUTH  DAILY, Disp: 90 tablet, Rfl: 3   ferrous sulfate 325 (65 FE) MG tablet, Take 325 mg by mouth daily with breakfast., Disp: , Rfl:    fluconazole (DIFLUCAN) 150 MG tablet, Take 1 tablet (150 mg total) by mouth every 3 (three) days as needed (for vaginal itching/yeast infection sx)., Disp: 2 tablet, Rfl: 0   gabapentin (NEURONTIN) 300 MG capsule, TAKE 1 CAPSULE BY MOUTH IN  THE MORNING AND 1 CAPSULE  BY MOUTH IN THE AFTERNOON  TAKE 2 CAPSULES BY MOUTH  BEFORE BEDTIME, Disp: 360 capsule, Rfl: 3   hydrALAZINE (APRESOLINE) 100 MG tablet, TAKE 1  TABLET BY MOUTH 3  TIMES DAILY, Disp: 270 tablet, Rfl: 0   Insulin Pen Needle 32G X 4 MM MISC, Use as directed with insulin daily, Disp: 100 each, Rfl: 1   JARDIANCE 10 MG TABS tablet, Take 10 mg by mouth daily., Disp: , Rfl:    LANTUS SOLOSTAR 100 UNIT/ML Solostar Pen, INJECT SUBCUTANEOUSLY 60 TO 80 UNITS DAILY, Disp: 75 mL, Rfl: 3   losartan (COZAAR) 100 MG tablet, Take 100 mg by mouth daily., Disp: , Rfl:    Multiple Vitamin (MULTIVITAMIN) tablet, Take 1 tablet by mouth daily., Disp: , Rfl:    nystatin cream (MYCOSTATIN), Apply 1 Application topically 2 (two) times daily., Disp: 30 g, Rfl: 0   PENNSAID 2 % SOLN, SMARTSIG:1 Pump Topical Twice Daily PRN, Disp: , Rfl:    venlafaxine XR (EFFEXOR-XR) 75 MG 24 hr capsule, TAKE 1 CAPSULE BY MOUTH  DAILY WITH BREAKFAST, Disp: 90 capsule, Rfl: 3   benzonatate (TESSALON) 200 MG capsule, Take 1 capsule (200 mg total) by mouth 2 (two) times daily as needed for cough., Disp: 20 capsule, Rfl: 0   carvedilol (COREG) 6.25 MG tablet, Take 2 tablets (12.5 mg total) by mouth 2 (two) times daily with a meal., Disp: , Rfl:    cloNIDine (CATAPRES - DOSED IN MG/24 HR) 0.1 mg/24hr patch, Place 1 patch (0.1 mg total) onto the skin once a week.,  Disp: 4 patch, Rfl: 0   Semaglutide, 1 MG/DOSE, 4 MG/3ML SOPN, Inject 1 mg as directed once a week for 4 doses., Disp: 3 mL, Rfl: 0   [START ON 09/20/2022] Semaglutide, 2 MG/DOSE, (OZEMPIC, 2 MG/DOSE,) 8 MG/3ML SOPN, INJECT 2 MG INTO THE SKIN ONCE A WEEK., Disp: 9 mL, Rfl: 3  Patient Active Problem List   Diagnosis Date Noted   Elevated sedimentation rate 08/01/2021   Rheumatoid factor positive 08/01/2021   Bilateral wrist pain 08/01/2021   Chronic kidney disease, stage IV (severe) ([place]) 04/25/2021   Secondary hyperparathyroidism of renal origin ([place]) 10/27/2020   Mild intermittent asthma without complication 69/67/8938   Hot flashes due to menopause 07/08/2020   Osteoarthritis of knee 07/08/2020   Pain in joint of left shoulder 07/08/2020   Lumbar radiculopathy 11/10/2019   Type 2 diabetes mellitus with hyperglycemia, with long-term current use of insulin ([place]) 09/22/2019   Insulin dependent type 2 diabetes mellitus, uncontrolled 09/22/2019   Anemia in chronic kidney disease 08/31/2019   Benign hypertensive kidney disease with chronic kidney disease 08/31/2019   Proteinuria 08/31/2019   Type 2 diabetes mellitus with diabetic chronic kidney disease ([place]) 08/31/2019   Hyperlipidemia associated with type 2 diabetes mellitus ([place]) 08/10/2019   Hemoptysis 11/15/2018   Class 2 severe obesity with serious comorbidity and body mass index (BMI) of 35.0 to 35.9 in adult ([place]) 09/22/2018   Anemia 06/19/2018   Diabetic retinopathy of both eyes associated with type 2 diabetes mellitus ([place]) 04/11/2018   Essential hypertension 03/07/2015    Past Surgical History:  Procedure Laterality Date   BREAST REDUCTION SURGERY  2001   CESAREAN SECTION     COLONOSCOPY WITH PROPOFOL N/A 08/15/2018   Procedure: COLONOSCOPY WITH PROPOFOL;  Surgeon: Jonathon Bellows, MD;  Location: [facility] ENDOSCOPY;  Service: Gastroenterology;  Laterality: N/A;   ESOPHAGOGASTRODUODENOSCOPY (EGD) WITH PROPOFOL N/A 08/15/2018    Procedure: ESOPHAGOGASTRODUODENOSCOPY (EGD) WITH PROPOFOL;  Surgeon: Jonathon Bellows, MD;  Location: [facility] ENDOSCOPY;  Service: Gastroenterology;  Laterality: N/A;   GIVENS CAPSULE STUDY N/A 09/08/2018   Procedure: GIVENS CAPSULE STUDY;  Surgeon:  Jonathon Bellows, MD;  Location: [facility] ENDOSCOPY;  Service: Gastroenterology;  Laterality: N/A;   REDUCTION MAMMAPLASTY Bilateral 2000   SHOULDER SURGERY Left    TUBAL LIGATION      Family History  Problem Relation Age of Onset   Diabetes Father    Hyperlipidemia Father    Hypertension Father    Cancer Maternal Aunt 60       leukemia - died   Diabetes Maternal Grandmother    Cancer Maternal Grandmother        breast cancer   Stroke Maternal Grandmother    Breast cancer Maternal Grandmother    Diabetes Daughter        Pre-diabetes   Diabetes Maternal Grandfather    Hyperlipidemia Maternal Grandfather    Hypertension Maternal Grandfather    Cancer Maternal Aunt 50       breast cancer - died   Ovarian cancer Neg Hx    Colon cancer Neg Hx     Social History   Tobacco Use   Smoking status: Never   Smokeless tobacco: Never  Vaping Use   Vaping Use: Never used  Substance Use Topics   Alcohol use: Yes    Alcohol/week: 0.0 standard drinks of alcohol    Comment: occ   Drug use: No     Allergies  Allergen Reactions   Hydrocodone Itching    Health Maintenance  Topic Date Due   MAMMOGRAM  01/11/2023   FOOT EXAM  01/30/2023   HEMOGLOBIN A1C  02/19/2023   OPHTHALMOLOGY EXAM  07/17/2023   Diabetic kidney evaluation - GFR measurement  08/21/2023   Diabetic kidney evaluation - Urine ACR  08/21/2023   TETANUS/TDAP  05/26/2024   PAP SMEAR-Modifier  08/23/2024   COLONOSCOPY (Pts 45-21yr Insurance coverage will need to be confirmed)  08/15/2028   INFLUENZA VACCINE  Completed   COVID-19 Vaccine  Completed   Hepatitis C Screening  Completed   HIV Screening  Completed   Zoster Vaccines- Shingrix  Completed   HPV VACCINES  Aged Out    Chart  Review Today: I personally reviewed active problem list, medication list, allergies, family history, social history, health maintenance, notes from last encounter, lab results, imaging with the patient/caregiver today.   Review of Systems  Constitutional: Negative.   HENT: Negative.    Eyes: Negative.   Respiratory: Negative.    Cardiovascular: Negative.   Gastrointestinal: Negative.   Endocrine: Negative.   Genitourinary: Negative.   Musculoskeletal: Negative.   Skin: Negative.   Allergic/Immunologic: Negative.   Neurological:  Positive for headaches. Negative for dizziness, weakness and light-headedness.  Hematological: Negative.   Psychiatric/Behavioral: Negative.    All other systems reviewed and are negative.    Objective:   Vitals:   09/13/22 1542 09/13/22 1552 09/13/22 1628  BP: (!) 178/92 (!) 164/90 (!) 166/92  Pulse: 84    Resp: 16    Temp: 98.2 F (36.8 C)    TempSrc: Oral    SpO2: 99%    Weight: 208 lb (94.3 kg)    Height: _0  (1.626 m)      Body mass index is 35.7 kg/m.  Physical Exam Vitals and nursing note reviewed.  Constitutional:      General: She is not in acute distress.    Appearance: Normal appearance. She is obese. She is not ill-appearing, toxic-appearing or diaphoretic.  HENT:     Head: Normocephalic and atraumatic.     Right Ear: External ear normal.     Left  Ear: External ear normal.     Nose: Nose normal.  Eyes:     General: No scleral icterus.       Right eye: No discharge.        Left eye: No discharge.     Conjunctiva/sclera: Conjunctivae normal.  Cardiovascular:     Rate and Rhythm: Normal rate and regular rhythm.     Pulses: Normal pulses.     Heart sounds: Normal heart sounds. No murmur heard.    No friction rub.  Pulmonary:     Effort: Pulmonary effort is normal. No respiratory distress.     Breath sounds: Normal breath sounds. No stridor. No wheezing or rales.  Abdominal:     General: Bowel sounds are normal.      Palpations: Abdomen is soft.  Musculoskeletal:     Right lower leg: No edema.     Left lower leg: No edema.  Skin:    General: Skin is warm and dry.     Coloration: Skin is not pale.     Findings: No lesion or rash.  Neurological:     Mental Status: She is alert. Mental status is at baseline.     Gait: Gait normal.  Psychiatric:        Mood and Affect: Mood normal.        Behavior: Behavior normal.         Assessment & Plan:   Problem List Items Addressed This Visit       Cardiovascular and Mediastinum   Essential hypertension - Primary (Chronic)    Not controlled - office and home readings ~160's On max hydralazine, norvasc and losartan Increase carvedilol - take 12.5 mg dose BID (take two of her current pills morning and night), monitor BP at home and do 2-3 week f/up virtual ok as long as she has BP readings BP Readings from Last 3 Encounters:  09/13/22 (!) 166/92  07/30/22 138/70  01/29/22 (!) 160/82       Relevant Orders   COMPLETE METABOLIC PANEL WITH GFR   Hot flashes due to menopause    Patient mentions worse vasomotor symptoms at completion of visit She is already taking venlafaxine 75 mg daily Unable to increase with her renal function Discussed trying clonidine for a few weeks and if no improvement medicine should be discontinued I reviewed with her how there can be some rebound hypertension and we will need to monitor for that and possibly do some kind of taper if she has rebound hypertension        Respiratory   Mild intermittent asthma without complication    Symptoms stable, no recent exacerbations, uses rescue inhaler as needed        Endocrine   DM type 2 with diabetic peripheral neuropathy ([place])    On max gabapentin dose per nephro      Hyperlipidemia associated with type 2 diabetes mellitus ([place])    Last lipid panel was about 7 months ago and LDL was increased She should be taking Zetia and Lipitor 80 mg  Lab Results  Component Value  Date   CHOL 192 01/29/2022   CHOL 154 03/14/2021   CHOL 126 08/10/2019   Lab Results  Component Value Date   HDL 65 01/29/2022   HDL 47 (L) 03/14/2021   HDL 40 (L) 08/10/2019   Lab Results  Component Value Date   LDLCALC 110 (H) 01/29/2022   [place] 83 03/14/2021   [place] 66 08/10/2019   Lab Results  Component Value Date   TRIG 80 01/29/2022   TRIG 137 03/14/2021   TRIG 123 08/10/2019  Hopefully improved med compliance, will check lipids next OV       Relevant Orders   Lipid panel   COMPLETE METABOLIC PANEL WITH GFR   Type 2 diabetes mellitus with hyperglycemia, with long-term current use of insulin (HCC)    Currently managed with ozempic 2 mg weekly, lantus 40-80 once daily, and nephro added on jardiance 10 Recent improved glycemic control, A1C has been at goal all year, she was able to decrease insulin dose However pharmacy did not have ozempic available for the past 4 weeks and she's had to give more insulin to cover Overall its an improvement that she is even checking her blood sugars Refills of ozempic 1 mg pen and 2 mg pen sent to [facility] mail order pharmacy -if she can get the 1 mg filled she will restart that for few weeks and then increase back up to 2 mg when it is available She has improved her glycemic monitoring and med adjustments -continue Lantus titration based on fasting blood sugar readings  She had improved microalbuminuria on Jardiance and would like to continue this medication -she may have to stop seeing her current nephrologist due to insurance changes next year but we can help continue her med refills. Lab Results  Component Value Date   HGBA1C 6.6 08/20/2022   HGBA1C 5.8 (H) 01/29/2022   HGBA1C 7.5 (A) 04/20/2021   Lab Results  Component Value Date   MICROALBUR 38.7 08/10/2019   LDLCALC 110 (H) 01/29/2022   CREATININE 2.36 (H) 01/29/2022   She is currently taking a statin, max losartan dose, she does have diabetic retinopathy diabetic kidney  disease and is currently compliant with her nephrology and eye specialist follow-up She does need to improve her follow-up here we are managing her diabetes I have explained this to her again today that she needs to come for office visit follow-ups every 3 months to allow [place] to continue to monitor her blood pressure and A1c and in order for me to refill her medications since she has uncontrolled insulin-dependent diabeties with multiple comorbidities       Relevant Orders   COMPLETE METABOLIC PANEL WITH GFR   Hemoglobin A1c   Secondary hyperparathyroidism of renal origin [facility])    Per nephrology, her labs were reviewed, PTH high        Genitourinary   Anemia in chronic kidney disease    Last CBC was April and Jan 2023 - no anemia On iron supplement, not requiring hematology currently Hemoglobin  Date Value Ref Range Status  01/29/2022 12.7 11.7 - 15.5 g/dL Final  03/14/2021 11.4 (L) 11.7 - 15.5 g/dL Final  04/10/2019 11.6 (L) 11.7 - 15.5 g/dL Final  11/15/2018 10.4 (L) 12.0 - 15.0 g/dL Final  03/04/2018 10.1 (L) 11.1 - 15.9 g/dL Final   Lab Results  Component Value Date   IRON 67 03/14/2021   TIBC 302 03/14/2021   FERRITIN 33 03/14/2021         Relevant Orders   CBC with Differential/Platelet   Chronic kidney disease, stage IV (severe) (HCC)    Last eGFR 26 She is due to f/up with nephrology in 6 months - but at that time they will not be in network They have adjusted bp meds this year and added jardiance PTH high, urine microalbumin/scr ratio high Advised to work on low salt diet BP uncontrolled - meds adjusted today -  may ask nephrology to see her sooner so she is not lost to f/up again      Other Visit Diagnoses     Encounter for medication monitoring       Relevant Orders   CBC with Differential/Platelet   Lipid panel   COMPLETE METABOLIC PANEL WITH GFR   Hemoglobin A1c       On other encounter from today which got cancelled - meds and changes were made  which are not captured here  Med dose for carvedilol was increased in the chart and when she returns in 2 to 3 weeks to follow-up if her blood pressure is well controlled I will change her prescription  Ozempic 1 mg and 2 mg pens were both refilled and prescribed to the pharmacy of her preference.  2 mg Ozempic is out of stock so we discussed doing 1 mg dose for a few weeks until 2 mg dose is available we additionally spent time today to call the pharmacy and confirm availability in the correct location for E prescribing today   Additionally menopausal symptoms and medications were looked up thoroughly with her reviewed that her venlafaxine is maximum dose for her renal function, reviewed and discussed clonidine at length oral versus patch-she preferred to do a weekly patch she is only being prescribed a low-dose of 7-day patches x4 if ineffective she is to DC the patches and if she has rebound hypertension we will have to address that at that time.   Pt encounter started at 15:40 and pt left clinic at 16:30, I was with patient for more than 40 minutes reviewing multiple uncontrolled, worsening chronic conditions reviewing medications, lab results, office visit notes from specialist through care everywhere, addressing multiple of these diagnosis and additionally she added on acute complaints at the end of the visit. Complexity is increased with her infrequent office visits, and changing her appointment where prior chart prep orders and diagnoses of all been lost another encounters. Level 5 billing for complexity of care and time spent today with this patient totaling more than 40 to 50 minutes with her, an additional 10 to 20 minutes prior to the office visit for chart prep and chart review, after patient left an additional 61mn+ reorganize her chart and complete documentation, orders and follow-up plan.  The patient will be able to follow-up with her nephrologist who is done most of her labs this  past year, she states that she is going to be unable to follow-up with them in April and this is when the last note says that she is due for follow-up so patient will likely be lost to follow-up and will need to present here for labs and then reestablish with local nephrology at that time/she can reestablish with Dr. KJuleen Chinaper insurance. She has been discussing decreasing renal function, severe chronic kidney disease and dialysis or transplant options, we discussed this at length today - pt is not considering dialysis unless she is a candidate for kidney and pancreas transplant candidate   Return for 2-3 week BP recheck virtual ok, 3 month in office IDDM/HTN/HLD f/up.   LDelsa Grana PA-C 09/13/22 5:43 PM

## 2022-09-13 NOTE — Assessment & Plan Note (Signed)
Not controlled - office and home readings ~160's On max hydralazine, norvasc and losartan Increase carvedilol - take 12.5 mg dose BID (take two of her current pills morning and night), monitor BP at home and do 2-3 week f/up virtual ok as long as she has BP readings BP Readings from Last 3 Encounters:  09/13/22 (!) 166/92  07/30/22 138/70  01/29/22 (!) 160/82

## 2022-09-13 NOTE — Assessment & Plan Note (Signed)
On max gabapentin dose per nephro

## 2022-09-13 NOTE — Assessment & Plan Note (Signed)
Symptoms stable, no recent exacerbations, uses rescue inhaler as needed

## 2022-09-13 NOTE — Assessment & Plan Note (Signed)
Patient mentions worse vasomotor symptoms at completion of visit She is already taking venlafaxine 75 mg daily Unable to increase with her renal function Discussed trying clonidine for a few weeks and if no improvement medicine should be discontinued I reviewed with her how there can be some rebound hypertension and we will need to monitor for that and possibly do some kind of taper if she has rebound hypertension

## 2022-09-17 ENCOUNTER — Encounter: Payer: Self-pay | Admitting: Family Medicine

## 2022-09-23 ENCOUNTER — Encounter: Payer: Self-pay | Admitting: Family Medicine

## 2022-09-24 ENCOUNTER — Other Ambulatory Visit: Payer: Self-pay | Admitting: Family Medicine

## 2022-09-24 DIAGNOSIS — E1169 Type 2 diabetes mellitus with other specified complication: Secondary | ICD-10-CM

## 2022-09-24 MED ORDER — ATORVASTATIN CALCIUM 80 MG PO TABS: 80.0000 mg | ORAL_TABLET | Freq: Every day | ORAL | 3 refills | Status: DC

## 2022-10-03 ENCOUNTER — Encounter: Payer: Self-pay | Admitting: Family Medicine

## 2022-10-03 ENCOUNTER — Telehealth: Payer: 59 | Admitting: Family Medicine

## 2022-10-03 ENCOUNTER — Other Ambulatory Visit: Payer: Self-pay | Admitting: Family Medicine

## 2022-10-03 DIAGNOSIS — N951 Menopausal and female climacteric states: Secondary | ICD-10-CM

## 2022-10-03 NOTE — Telephone Encounter (Signed)
Pt sch'd for virtual visit today with [facility]

## 2022-10-08 NOTE — Progress Notes (Signed)
Virtual visit was done because I need her to follow-up and follow recommendations so I can recheck her uncontrolled blood pressure and adjust or refill her medications she scheduled an appointment but after being triaged by CMA she still wished to not do the appointment and it was canceled and rescheduled

## 2022-10-16 ENCOUNTER — Telehealth (INDEPENDENT_AMBULATORY_CARE_PROVIDER_SITE_OTHER): Payer: 59 | Admitting: Family Medicine

## 2022-10-16 ENCOUNTER — Encounter: Payer: Self-pay | Admitting: Family Medicine

## 2022-10-16 DIAGNOSIS — I10 Essential (primary) hypertension: Secondary | ICD-10-CM | POA: Diagnosis not present

## 2022-10-16 DIAGNOSIS — N951 Menopausal and female climacteric states: Secondary | ICD-10-CM

## 2022-10-16 MED ORDER — CLONIDINE 0.1 MG/24HR TD PTWK: 0.1000 mg | MEDICATED_PATCH | TRANSDERMAL | 1 refills | Status: DC

## 2022-10-16 MED ORDER — CARVEDILOL 12.5 MG PO TABS: 12.5000 mg | ORAL_TABLET | Freq: Two times a day (BID) | ORAL | 1 refills | Status: DC

## 2022-10-16 NOTE — Assessment & Plan Note (Signed)
Patient is currently on maximum dose of Effexor that she is able to take with her renal function About a month ago we added clonidine patch which she states has helped some with her hot flashes they are less frequent and less severe Refills entered, continue to monitor vasomotor symptoms and blood pressure

## 2022-10-16 NOTE — Progress Notes (Signed)
Name: Janice Jennings   MRN: [id number]    DOB: 1971-08-07   Date:10/16/2022       Progress Note  Chief Complaint  Patient presents with   Follow-up   Hypertension    Averages 130/70s, unable to give bp reading today     Subjective:   Janice Jennings is a 51 y.o. female, presents to clinic for follow-up on elevated blood pressure  BP Readings from Last 3 Encounters:  09/13/22 (!) 166/92  07/30/22 138/70  01/29/22 (!) 160/82   Plan after last OV 11/16: Not controlled - office and home readings ~160's On max hydralazine 100 mg 3 times daily, norvasc 10 mg daily and losartan 100 mg once daily and carvedilol 6.25 twice daily Plan was to increase carvedilol - take 12.5 mg dose BID (take two of her current pills morning and night), monitor BP at home and do 2-3 week f/up virtual ok as long as she has BP readings  She brought in readings today and states that she took the higher dose carvedilol for 2 weeks but then stopped She brings in about a blood pressure readings from November 30 12/10/2016 initially blood pressure was 154/86 most of the readings between 12/4 and 12/15 were 120s to 130s/70-72 No SE or concerns with the dose increase    Current Outpatient Medications:    albuterol (VENTOLIN HFA) 108 (90 Base) MCG/ACT inhaler, Inhale 2 puffs into the lungs every 4 (four) hours as needed for wheezing or shortness of breath., Disp: 1 each, Rfl: 1   amLODipine (NORVASC) 10 MG tablet, TAKE 1 TABLET BY MOUTH DAILY, Disp: 90 tablet, Rfl: 0   atorvastatin (LIPITOR) 80 MG tablet, Take 1 tablet (80 mg total) by mouth at bedtime., Disp: 90 tablet, Rfl: 3   carvedilol (COREG) 6.25 MG tablet, Take 2 tablets (12.5 mg total) by mouth 2 (two) times daily with a meal., Disp: , Rfl:    cloNIDine (CATAPRES - DOSED IN MG/24 HR) 0.1 mg/24hr patch, Place 1 patch (0.1 mg total) onto the skin once a week., Disp: 4 patch, Rfl: 0   Continuous Blood Gluc Sensor (FREESTYLE LIBRE 2 SENSOR) MISC, PLACE ON ARM  AND USE AS DIRECTED, Disp: 2 each, Rfl: 11   cyclobenzaprine (FLEXERIL) 5 MG tablet, TAKE 1 TABLET BY MOUTH THREE TIMES A DAY AS NEEDED FOR MUSCLE SPASMS, Disp: 30 tablet, Rfl: 1   ezetimibe (ZETIA) 10 MG tablet, TAKE 1 TABLET BY MOUTH  DAILY, Disp: 90 tablet, Rfl: 3   ferrous sulfate 325 (65 FE) MG tablet, Take 325 mg by mouth daily with breakfast., Disp: , Rfl:    fluconazole (DIFLUCAN) 150 MG tablet, Take 1 tablet (150 mg total) by mouth every 3 (three) days as needed (for vaginal itching/yeast infection sx)., Disp: 2 tablet, Rfl: 0   gabapentin (NEURONTIN) 300 MG capsule, TAKE 1 CAPSULE BY MOUTH IN  THE MORNING AND 1 CAPSULE  BY MOUTH IN THE AFTERNOON  TAKE 2 CAPSULES BY MOUTH  BEFORE BEDTIME, Disp: 360 capsule, Rfl: 3   hydrALAZINE (APRESOLINE) 100 MG tablet, TAKE 1 TABLET BY MOUTH 3  TIMES DAILY, Disp: 270 tablet, Rfl: 0   Insulin Pen Needle 32G X 4 MM MISC, Use as directed with insulin daily, Disp: 100 each, Rfl: 1   JARDIANCE 10 MG TABS tablet, Take 10 mg by mouth daily., Disp: , Rfl:    LANTUS SOLOSTAR 100 UNIT/ML Solostar Pen, INJECT SUBCUTANEOUSLY 60 TO 80 UNITS DAILY, Disp: 75 mL, Rfl: 3   losartan (  COZAAR) 100 MG tablet, Take 100 mg by mouth daily., Disp: , Rfl:    Multiple Vitamin (MULTIVITAMIN) tablet, Take 1 tablet by mouth daily., Disp: , Rfl:    nystatin cream (MYCOSTATIN), Apply 1 Application topically 2 (two) times daily., Disp: 30 g, Rfl: 0   PENNSAID 2 % SOLN, SMARTSIG:1 Pump Topical Twice Daily PRN, Disp: , Rfl:    Semaglutide, 2 MG/DOSE, (OZEMPIC, 2 MG/DOSE,) 8 MG/3ML SOPN, INJECT 2 MG INTO THE SKIN ONCE A WEEK., Disp: 9 mL, Rfl: 3   venlafaxine XR (EFFEXOR-XR) 75 MG 24 hr capsule, TAKE 1 CAPSULE BY MOUTH  DAILY WITH BREAKFAST, Disp: 90 capsule, Rfl: 3  Patient Active Problem List   Diagnosis Date Noted   Elevated sedimentation rate 08/01/2021   Rheumatoid factor positive 08/01/2021   Bilateral wrist pain 08/01/2021   Chronic kidney disease, stage IV (severe) ([place])  04/25/2021   Secondary hyperparathyroidism of renal origin ([place]) 10/27/2020   Mild intermittent asthma without complication 66/44/0347   Hot flashes due to menopause 07/08/2020   Osteoarthritis of knee 07/08/2020   Pain in joint of left shoulder 07/08/2020   Lumbar radiculopathy 11/10/2019   Type 2 diabetes mellitus with hyperglycemia, with long-term current use of insulin ([place]) 09/22/2019   Anemia in chronic kidney disease 08/31/2019   Benign hypertensive kidney disease with chronic kidney disease 08/31/2019   Proteinuria 08/31/2019   Type 2 diabetes mellitus with diabetic chronic kidney disease ([place]) 08/31/2019   Hyperlipidemia associated with type 2 diabetes mellitus ([place]) 08/10/2019   Hemoptysis 11/15/2018   Class 2 severe obesity with serious comorbidity and body mass index (BMI) of 35.0 to 35.9 in adult ([place]) 09/22/2018   Diabetic retinopathy of both eyes associated with type 2 diabetes mellitus ([place]) 04/11/2018   Essential hypertension 03/07/2015   DM type 2 with diabetic peripheral neuropathy ([place]) 05/26/2014    Past Surgical History:  Procedure Laterality Date   BREAST REDUCTION SURGERY  2001   CESAREAN SECTION     COLONOSCOPY WITH PROPOFOL N/A 08/15/2018   Procedure: COLONOSCOPY WITH PROPOFOL;  Surgeon: Jonathon Bellows, MD;  Location: [facility] ENDOSCOPY;  Service: Gastroenterology;  Laterality: N/A;   ESOPHAGOGASTRODUODENOSCOPY (EGD) WITH PROPOFOL N/A 08/15/2018   Procedure: ESOPHAGOGASTRODUODENOSCOPY (EGD) WITH PROPOFOL;  Surgeon: Jonathon Bellows, MD;  Location: [facility] ENDOSCOPY;  Service: Gastroenterology;  Laterality: N/A;   GIVENS CAPSULE STUDY N/A 09/08/2018   Procedure: GIVENS CAPSULE STUDY;  Surgeon: Jonathon Bellows, MD;  Location: [facility] ENDOSCOPY;  Service: Gastroenterology;  Laterality: N/A;   REDUCTION MAMMAPLASTY Bilateral 2000   SHOULDER SURGERY Left    TUBAL LIGATION      Family History  Problem Relation Age of Onset   Diabetes Father    Hyperlipidemia Father     Hypertension Father    Cancer Maternal Aunt 28       leukemia - died   Diabetes Maternal Grandmother    Cancer Maternal Grandmother        breast cancer   Stroke Maternal Grandmother    Breast cancer Maternal Grandmother    Diabetes Daughter        Pre-diabetes   Diabetes Maternal Grandfather    Hyperlipidemia Maternal Grandfather    Hypertension Maternal Grandfather    Cancer Maternal Aunt 50       breast cancer - died   Ovarian cancer Neg Hx    Colon cancer Neg Hx     Social History   Tobacco Use   Smoking status: Never   Smokeless tobacco: Never  Vaping  Use   Vaping Use: Never used  Substance Use Topics   Alcohol use: Yes    Alcohol/week: 0.0 standard drinks of alcohol    Comment: occ   Drug use: No     Allergies  Allergen Reactions   Hydrocodone Itching    Health Maintenance  Topic Date Due   COVID-19 Vaccine (9 - 2023-24 season) 10/19/2022 (Originally 06/29/2022)   MAMMOGRAM  01/11/2023   FOOT EXAM  01/30/2023   HEMOGLOBIN A1C  02/19/2023   OPHTHALMOLOGY EXAM  07/17/2023   Diabetic kidney evaluation - eGFR measurement  08/21/2023   Diabetic kidney evaluation - Urine ACR  08/21/2023   DTaP/Tdap/Td (2 - Tdap) 05/26/2024   PAP SMEAR-Modifier  08/23/2024   COLONOSCOPY (Pts 45-71yr Insurance coverage will need to be confirmed)  08/15/2028   INFLUENZA VACCINE  Completed   Hepatitis C Screening  Completed   HIV Screening  Completed   Zoster Vaccines- Shingrix  Completed   HPV VACCINES  Aged Out    Chart Review Today: I personally reviewed active problem list, medication list, allergies, family history, social history, health maintenance, notes from last encounter, lab results, imaging with the patient/caregiver today.   Review of Systems  Constitutional: Negative.   HENT: Negative.    Eyes: Negative.   Respiratory: Negative.    Cardiovascular: Negative.   Gastrointestinal: Negative.   Endocrine: Negative.   Genitourinary: Negative.   Musculoskeletal:  Negative.   Skin: Negative.   Allergic/Immunologic: Negative.   Neurological: Negative.   Hematological: Negative.   Psychiatric/Behavioral: Negative.    All other systems reviewed and are negative.    Objective:   Vitals:   10/16/22 0957  BP: 136/72  Pulse: 91  Resp: 16  Temp: 97.6 F (36.4 C)  TempSrc: Oral  SpO2: 98%  Weight: 207 lb 1.6 oz (93.9 kg)  Height: _0  (1.626 m)    Body mass index is 35.55 kg/m.  Physical Exam Vitals and nursing note reviewed.  Constitutional:      General: She is not in acute distress.    Appearance: She is well-developed. She is obese. She is not ill-appearing, toxic-appearing or diaphoretic.  HENT:     Head: Normocephalic and atraumatic.     Nose: Nose normal.  Eyes:     General:        Right eye: No discharge.        Left eye: No discharge.     Conjunctiva/sclera: Conjunctivae normal.  Neck:     Trachea: No tracheal deviation.  Cardiovascular:     Rate and Rhythm: Normal rate and regular rhythm.     Pulses: Normal pulses.     Heart sounds: Normal heart sounds. No murmur heard.    No friction rub. No gallop.  Pulmonary:     Effort: Pulmonary effort is normal. No respiratory distress.     Breath sounds: No stridor.  Skin:    General: Skin is warm and dry.     Findings: No rash.  Neurological:     Mental Status: She is alert.     Motor: No abnormal muscle tone.     Coordination: Coordination normal.  Psychiatric:        Behavior: Behavior normal.         Assessment & Plan:   Problem List Items Addressed This Visit       Cardiovascular and Mediastinum   Essential hypertension (Chronic)    Uncontrolled hypertension Follow-up from office visit about 2 weeks ago BP Readings  from Last 3 Encounters:  10/16/22 136/72  09/13/22 (!) 166/92  07/30/22 138/70  She did take the increased dose of carvedilol for only 2 weeks but then stopped (? Misunderstood the plan to recheck after 2-3 weeks because then it would have  had a chance to work for her)  Her blood pressure is improved today compared to last office visit a month ago I did send in the dose change for her carvedilol to take 12.5 twice daily and to continue to take amlodipine 10 mg daily, hydralazine 100 mg 3 times daily, continue losartan 100 mg daily  We do need to recheck her in about 1 to 2 months to ensure that her blood pressure is still well-controlled and at that time repeat check labs end of January beginning of February       Relevant Medications   carvedilol (COREG) 12.5 MG tablet      Hot flashes due to menopause    Patient is currently on maximum dose of Effexor that she is able to take with her renal function About a month ago we added clonidine patch which she states has helped some with her hot flashes they are less frequent and less severe Refills entered, continue to monitor vasomotor symptoms and blood pressure      Relevant Medications      cloNIDine (CATAPRES - DOSED IN MG/24 HR) 0.1 mg/24hr patch     Return for 1-2 month f/up for BP, A1C recheck .   Delsa Grana, PA-C 10/16/22 9:55 AM

## 2022-10-16 NOTE — Assessment & Plan Note (Signed)
Uncontrolled hypertension Follow-up from office visit about 2 weeks ago BP Readings from Last 3 Encounters:  10/16/22 136/72  09/13/22 (!) 166/92  07/30/22 138/70  She did take the increased dose of carvedilol for only 2 weeks but then stopped (? Misunderstood the plan to recheck after 2-3 weeks because then it would have had a chance to work for her)  Her blood pressure is improved today compared to last office visit a month ago I did send in the dose change for her carvedilol to take 12.5 twice daily and to continue to take amlodipine 10 mg daily, hydralazine 100 mg 3 times daily, continue losartan 100 mg daily  We do need to recheck her in about 1 to 2 months to ensure that her blood pressure is still well-controlled and at that time repeat check labs end of January beginning of February

## 2022-10-25 ENCOUNTER — Encounter: Payer: Self-pay | Admitting: Family Medicine

## 2022-10-25 DIAGNOSIS — E1169 Type 2 diabetes mellitus with other specified complication: Secondary | ICD-10-CM

## 2022-10-25 MED ORDER — ATORVASTATIN CALCIUM 80 MG PO TABS: 80.0000 mg | ORAL_TABLET | Freq: Every day | ORAL | 3 refills | Status: DC

## 2022-11-02 ENCOUNTER — Other Ambulatory Visit: Payer: Self-pay | Admitting: Family Medicine

## 2022-11-02 DIAGNOSIS — I10 Essential (primary) hypertension: Secondary | ICD-10-CM

## 2022-11-05 NOTE — Telephone Encounter (Signed)
Appt sch'd for 2.2.2024

## 2022-11-16 ENCOUNTER — Ambulatory Visit: Payer: Self-pay | Admitting: *Deleted
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
## 2022-11-16 NOTE — Telephone Encounter (Signed)
FYI

## 2022-11-16 NOTE — Telephone Encounter (Signed)
Spoke with pt and informed her that we currently only have two providers on staff today and that we are completely booked. She verbalized understanding and will go to urgent care.

## 2022-11-28 ENCOUNTER — Ambulatory Visit: Payer: 59 | Admitting: Physician Assistant

## 2022-11-28 ENCOUNTER — Encounter: Payer: Self-pay | Admitting: Physician Assistant

## 2022-11-28 VITALS — BP 110/60 | HR 93 | Temp 98.1°F | Resp 16 | Ht 64.0 in | Wt 207.7 lb

## 2022-11-28 DIAGNOSIS — E1142 Type 2 diabetes mellitus with diabetic polyneuropathy: Secondary | ICD-10-CM | POA: Diagnosis not present

## 2022-11-28 DIAGNOSIS — I10 Essential (primary) hypertension: Secondary | ICD-10-CM

## 2022-11-28 DIAGNOSIS — E785 Hyperlipidemia, unspecified: Secondary | ICD-10-CM

## 2022-11-28 DIAGNOSIS — N184 Chronic kidney disease, stage 4 (severe): Secondary | ICD-10-CM

## 2022-11-28 DIAGNOSIS — E1169 Type 2 diabetes mellitus with other specified complication: Secondary | ICD-10-CM

## 2022-11-28 NOTE — Assessment & Plan Note (Signed)
Chronic, historic condition Currently managed with Atorvastatin 80 mg PO QD and Zetia 10 mg PO QD - appears to be tolerating well  Continue current regimen Recheck lipid panel today- she reports she had a low glucose level this AM so she is not fasting, will keep this in mind while reviewing results Follow up in 3 months- may need to repeat fasting lipid levels if results are too skewed.

## 2022-11-28 NOTE — Assessment & Plan Note (Addendum)
Chronic, historic condition Most recent A1c in results review is 6.6  She is taking Lantus 60-80 units per day, Semaglutide 2 mg/dose injection weekly, Jardiance 10 mg PO QD and appears to be tolerating well She reports her glucose levels rose a bit over the holidays so she is trying to return to her regular diet and glucose levels Fasting appears to avg 150s-170s and postprandials are around 200 per phone/ app readouts She is actively followed by [facility] for DM CKD  She has regular follow up with Ophthalmology as well for retinopathy Recommend she continue current regimen- will recheck A1c today- results to dictate further management Follow up in 3 months for monitoring

## 2022-11-28 NOTE — Assessment & Plan Note (Signed)
Chronic, historic condition Appears well managed today after recheck and reviewing home measures She is currently taking Amlodipine 10 mg PO QD, Carvedilol 12.5 mg PO BID, Clonidine 0.1 mg/24 hour patch, Hydralazine 100 mg PO TID, Losartan 100 mg PO QD- appears to be tolerating well Continue current regimen Recommend exercise and dietary measures to further improve management  Follow up in 3 months or sooner if concerns arise
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
## 2022-11-28 NOTE — Assessment & Plan Note (Signed)
Chronic, ongoing  Her most recent eGFR was 26 in Oct Will recheck with CMP today  She is followed by Nephrology every 6 months but is concerned her [facility] provider may not be in network for much longer She has follow up scheduled and is following their recommendations Continue collaboration with Nephrology and will try to provide continued control of HTN and DM to mitigate further decline Follow up in 6 months for repeat CMP and GFR monitoring or sooner if concerns arise

## 2022-11-29 LAB — COMPLETE METABOLIC PANEL WITH GFR
AG Ratio: 1.1 (calc) (ref 1.0–2.5)
ALT: 19 U/L (ref 6–29)
AST: 15 U/L (ref 10–35)
Albumin: 3.7 g/dL (ref 3.6–5.1)
Alkaline phosphatase (APISO): 89 U/L (ref 37–153)
BUN/Creatinine Ratio: 17 (calc) (ref 6–22)
BUN: 38 mg/dL — ABNORMAL HIGH (ref 7–25)
CO2: 27 mmol/L (ref 20–32)
Calcium: 9.1 mg/dL (ref 8.6–10.4)
Chloride: 106 mmol/L (ref 98–110)
Creat: 2.29 mg/dL — ABNORMAL HIGH (ref 0.50–1.03)
Globulin: 3.5 g/dL (calc) (ref 1.9–3.7)
Glucose, Bld: 130 mg/dL — ABNORMAL HIGH (ref 65–99)
Potassium: 4.5 mmol/L (ref 3.5–5.3)
Sodium: 140 mmol/L (ref 135–146)
Total Bilirubin: 0.4 mg/dL (ref 0.2–1.2)
Total Protein: 7.2 g/dL (ref 6.1–8.1)
eGFR: 25 mL/min/{1.73_m2} — ABNORMAL LOW (ref >=60)

## 2022-11-29 LAB — CBC WITH DIFFERENTIAL/PLATELET
Absolute Monocytes: 356 cells/uL (ref 200–950)
Basophils Absolute: 32 cells/uL (ref 0–200)
Basophils Relative: 0.6 %
Eosinophils Absolute: 92 cells/uL (ref 15–500)
Eosinophils Relative: 1.7 %
HCT: 37 % (ref 35.0–45.0)
Hemoglobin: 12.8 g/dL (ref 11.7–15.5)
Lymphs Abs: 1723 cells/uL (ref 850–3900)
MCH: 31.7 pg (ref 27.0–33.0)
MCHC: 34.6 g/dL (ref 32.0–36.0)
MCV: 91.6 fL (ref 80.0–100.0)
MPV: 10.7 fL (ref 7.5–12.5)
Monocytes Relative: 6.6 %
Neutro Abs: 3197 cells/uL (ref 1500–7800)
Neutrophils Relative %: 59.2 %
Platelets: 241 10*3/uL (ref 140–400)
RBC: 4.04 10*6/uL (ref 3.80–5.10)
RDW: 12 % (ref 11.0–15.0)
Total Lymphocyte: 31.9 %
WBC: 5.4 10*3/uL (ref 3.8–10.8)

## 2022-11-29 LAB — LIPID PANEL
Cholesterol: 126 mg/dL (ref <200)
HDL: 51 mg/dL (ref >=50)
LDL Cholesterol (Calc): 60 mg/dL (calc)
Non-HDL Cholesterol (Calc): 75 mg/dL (calc) (ref <130)
Total CHOL/HDL Ratio: 2.5 (calc) (ref <5.0)
Triglycerides: 70 mg/dL (ref <150)

## 2022-11-29 LAB — HEMOGLOBIN A1C
Hgb A1c MFr Bld: 7.7 % of total Hgb — ABNORMAL HIGH (ref <5.7)
Mean Plasma Glucose: 174 mg/dL
eAG (mmol/L): 9.7 mmol/L

## 2022-11-30 ENCOUNTER — Ambulatory Visit: Payer: 59 | Admitting: Family Medicine

## 2022-12-03 NOTE — Progress Notes (Signed)
Your A1c has increased to 7.7 - this is above goal and will likely mean that you and your PCP will need to make some changes to your medications. Please schedule an apt to discuss this soon and we will recheck in 3 months. Your kidney function appears stable at this time. No changes there.  Your electrolytes and liver function appear to be in normal ranges.  Your cholesterol looks great- keep up the good work Your CBC does not show signs of anemia or elevated white count

## 2022-12-31 ENCOUNTER — Other Ambulatory Visit: Payer: Self-pay | Admitting: Family Medicine

## 2022-12-31 DIAGNOSIS — I10 Essential (primary) hypertension: Secondary | ICD-10-CM

## 2023-01-01 ENCOUNTER — Other Ambulatory Visit: Payer: Self-pay | Admitting: Family Medicine

## 2023-01-01 DIAGNOSIS — E1165 Type 2 diabetes mellitus with hyperglycemia: Secondary | ICD-10-CM

## 2023-01-01 DIAGNOSIS — Z794 Long term (current) use of insulin: Secondary | ICD-10-CM

## 2023-01-01 DIAGNOSIS — E1142 Type 2 diabetes mellitus with diabetic polyneuropathy: Secondary | ICD-10-CM

## 2023-01-01 DIAGNOSIS — I10 Essential (primary) hypertension: Secondary | ICD-10-CM

## 2023-01-01 MED ORDER — OZEMPIC (2 MG/DOSE) 8 MG/3ML ~~LOC~~ SOPN: PEN_INJECTOR | SUBCUTANEOUS | 1 refills | Status: DC

## 2023-01-02 ENCOUNTER — Other Ambulatory Visit: Payer: Self-pay | Admitting: Family Medicine

## 2023-01-02 DIAGNOSIS — I10 Essential (primary) hypertension: Secondary | ICD-10-CM

## 2023-02-25 ENCOUNTER — Other Ambulatory Visit: Payer: Self-pay | Admitting: Family Medicine

## 2023-02-25 DIAGNOSIS — I10 Essential (primary) hypertension: Secondary | ICD-10-CM

## 2023-03-01 ENCOUNTER — Ambulatory Visit: Payer: 59 | Admitting: Family Medicine

## 2023-03-01 VITALS — BP 122/70 | HR 100 | Temp 97.7°F | Resp 16 | Ht 64.0 in | Wt 207.6 lb

## 2023-03-01 DIAGNOSIS — E1142 Type 2 diabetes mellitus with diabetic polyneuropathy: Secondary | ICD-10-CM

## 2023-03-01 DIAGNOSIS — Z6835 Body mass index (BMI) 35.0-35.9, adult: Secondary | ICD-10-CM

## 2023-03-01 DIAGNOSIS — E1165 Type 2 diabetes mellitus with hyperglycemia: Secondary | ICD-10-CM | POA: Diagnosis not present

## 2023-03-01 DIAGNOSIS — I10 Essential (primary) hypertension: Secondary | ICD-10-CM

## 2023-03-01 DIAGNOSIS — N184 Chronic kidney disease, stage 4 (severe): Secondary | ICD-10-CM | POA: Diagnosis not present

## 2023-03-01 DIAGNOSIS — Z1231 Encounter for screening mammogram for malignant neoplasm of breast: Secondary | ICD-10-CM

## 2023-03-01 DIAGNOSIS — D631 Anemia in chronic kidney disease: Secondary | ICD-10-CM

## 2023-03-01 DIAGNOSIS — Z794 Long term (current) use of insulin: Secondary | ICD-10-CM

## 2023-03-01 DIAGNOSIS — E162 Hypoglycemia, unspecified: Secondary | ICD-10-CM

## 2023-03-01 DIAGNOSIS — N2581 Secondary hyperparathyroidism of renal origin: Secondary | ICD-10-CM

## 2023-03-01 DIAGNOSIS — M545 Low back pain, unspecified: Secondary | ICD-10-CM

## 2023-03-01 MED ORDER — NEURAPTINE 10 % EX CREA
1.0000 | TOPICAL_CREAM | Freq: Every day | CUTANEOUS | 5 refills | Status: DC | PRN
Start: 2023-03-01 — End: 2024-01-17

## 2023-03-01 MED ORDER — GABAPENTIN 300 MG PO CAPS
300.0000 mg | ORAL_CAPSULE | Freq: Two times a day (BID) | ORAL | 3 refills | Status: AC
Start: 2023-03-01 — End: ?

## 2023-03-01 MED ORDER — GABAPENTIN 300 MG PO CAPS
300.0000 mg | ORAL_CAPSULE | Freq: Three times a day (TID) | ORAL | 1 refills | Status: DC
Start: 2023-03-01 — End: 2023-03-01

## 2023-03-01 MED ORDER — LIDOCAINE 5 % EX PTCH: 1.0000 | MEDICATED_PATCH | CUTANEOUS | 0 refills | Status: DC

## 2023-03-01 MED ORDER — FREESTYLE LIBRE 3 SENSOR MISC
1.0000 | 1 refills | Status: DC
Start: 2023-03-01 — End: 2023-04-11

## 2023-03-01 NOTE — Assessment & Plan Note (Signed)
Blood counts have been stable - monitored by nephrology  Hgb good, not requiring ESAs - on ferrous sulfate daily, managed by PCP

## 2023-03-01 NOTE — Progress Notes (Signed)
Name: Janice Jennings   MRN: [id number]    DOB: 02/14/1971   Date:03/01/2023       Progress Note  Chief Complaint  Patient presents with   Follow-up   Hypertension   Diabetes     Subjective:   Janice Jennings is a 52 y.o. female, presents to clinic for f/up   HTN with ckd stage 4 - sees division of Nephrology and hypertension  and [facility] nephrology clinic On losartan 100 mg, hydralazine 100 mg BID, amlodipine 10 daily, carvedilol 12.5 BP Readings from Last 3 Encounters:  03/01/23 122/70  11/28/22 110/60  10/16/22 136/72   DM:  last labs A1C uncontrolled/increased Pt managing DM with lantus, ozempic, jardiance Reports good med compliance - lantus ~60 units she reports holding it some nights when her sugars are 99 or she was feeling bad in the am On libre 2 meter only one low sugar reading in the last 6 days, no CBGs done  Lows on libre 2 are 100-150, spikes with eating that return to baseline w/in 1-2 hours Denies: Polyuria, polydipsia, vision changes, neuropathy, hypoglycemia Recent pertinent labs: Lab Results  Component Value Date   HGBA1C 7.7 (H) 11/28/2022   HGBA1C 6.6 08/20/2022   HGBA1C 5.8 (H) 01/29/2022   Lab Results  Component Value Date   MICROALBUR 38.7 08/10/2019   LDLCALC 60 11/28/2022   CREATININE 2.29 (H) 11/28/2022   Standard of care and health maintenance: Urine Microalbumin:  utd Foot exam:  due DM eye exam:  utd ACEI/ARB:  losartan Statin:  yes with zetia   Nephrology last OV note 08/28/2022: ASSESSMENT/PLAN: [name] is a 52 y.o. year old patient with  1. Chronic renal disease, stage 4, severely decreased glomerular filtration rate (GFR) between 15-29 mL/min/1.73 square meter (CMS-HCC)  2. Controlled type 2 diabetes mellitus with complication, with long-term current use of insulin (CMS-HCC)  3. Essential hypertension  4. Anemia due to stage 4 chronic kidney disease (CMS-HCC)  5. Secondary hyperparathyroidism of renal origin  (CMS-HCC)   1. CKD IV (G4/A3). Likely 2/2 DKD. 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 She has thought about transplant and if K/P is an option, she would consider, but if K/P not an option she would not consider. BL Cr: 2.2-2.4 and most recently 2.2 Proteinuria/Albuminuria: 568 mg/gm on UACR 07/2022 KFRE: KFRE 2-yr risk 16% and 5-yr risk 41% (improved from last visit) - continue to avoid nephrotoxins, including NSAIDs - on max dose losartan - on SGLT2i, but need to work to see if she can stay on it since insurance is changing coverage next year - max dose of gabapentin for her in 300 bid, which is what she is currently taking - will plan to inquire with transplant as we get closer to eGFR 20 about whether she is K/P candidate  2. HTN. BP slightly above goal - continue on amlodipine 10, carvedilol 6.25 bid, amlodipine 10, hydralazine 100 bid - also on empagliflozin  3. CKD-MBD. PTH was up a little, Ca and Phos good - CTM - PCP will check her vit d soon, but not currently on supplement except with MVI  4. Anemia. Hgb good, not requiring ESAs - on ferrous sulfate daily, managed by PCP  5. Electrolytes. K and CO2 in range  6. Prevention. On statin and ezetimibe  7. DM2.  - continue on on ozempic, SGLT2i, lantus 30-50U depending on sugars - manages with PCP - consider terzepitide - I have messaged  our CKD pharmacist to see if she can help [place] figure out the SGLT2i issue since I think it is important for her to stay on it  Janice Jennings will follow up in Return in about 6 months (around 02/26/2023) for Next scheduled follow up, Labs. or sooner as needed.   The patient will need BMP, Phos/PTH, UACR, A1c within 4 weeks prior to next visit.  Mila Merry, MD    Hemoglobin  Date Value Ref Range Status  11/28/2022 12.8 11.7 - 15.5 g/dL Final  65/78/4696 29.5 11.7 - 15.5 g/dL Final  28/41/3244 01.0 (L) 11.7 - 15.5 g/dL Final   27/25/3664 40.3 (L) 11.7 - 15.5 g/dL Final  47/42/5956 38.7 (L) 11.1 - 15.9 g/dL Final   HGB  Date Value Ref Range Status  02/14/2013 14.6 12.0 - 16.0 g/dL Final   Lab Results  Component Value Date   CHOL 126 11/28/2022   HDL 51 11/28/2022   LDLCALC 60 11/28/2022   TRIG 70 11/28/2022   CHOLHDL 2.5 11/28/2022    Nephrology f/up is delayed do to insurance and scheduling  She's having bad back pain and spasms treated by ortho and a chiropractor using lidocaine patches, topical gabapentin     Current Outpatient Medications:    albuterol (VENTOLIN HFA) 108 (90 Base) MCG/ACT inhaler, Inhale 2 puffs into the lungs every 4 (four) hours as needed for wheezing or shortness of breath., Disp: 1 each, Rfl: 1   amLODipine (NORVASC) 10 MG tablet, TAKE 1 TABLET BY MOUTH DAILY, Disp: 90 tablet, Rfl: 0   atorvastatin (LIPITOR) 80 MG tablet, Take 1 tablet (80 mg total) by mouth at bedtime., Disp: 90 tablet, Rfl: 3   carvedilol (COREG) 12.5 MG tablet, Take 1 tablet (12.5 mg total) by mouth 2 (two) times daily with a meal., Disp: 180 tablet, Rfl: 1   cloNIDine (CATAPRES - DOSED IN MG/24 HR) 0.1 mg/24hr patch, Place 1 patch (0.1 mg total) onto the skin once a week., Disp: 12 patch, Rfl: 1   Continuous Glucose Sensor (FREESTYLE LIBRE 3 SENSOR) MISC, 1 Device by Does not apply route every 14 (fourteen) days., Disp: 6 each, Rfl: 1   cyclobenzaprine (FLEXERIL) 5 MG tablet, TAKE 1 TABLET BY MOUTH THREE TIMES A DAY AS NEEDED FOR MUSCLE SPASMS, Disp: 30 tablet, Rfl: 1   ezetimibe (ZETIA) 10 MG tablet, TAKE 1 TABLET BY MOUTH  DAILY, Disp: 90 tablet, Rfl: 3   ferrous sulfate 325 (65 FE) MG tablet, Take 325 mg by mouth daily with breakfast., Disp: , Rfl:    fluconazole (DIFLUCAN) 150 MG tablet, Take 1 tablet (150 mg total) by mouth every 3 (three) days as needed (for vaginal itching/yeast infection sx)., Disp: 2 tablet, Rfl: 0   Gabapentin (NEURAPTINE) 10 % CREA, Apply 1 Application topically daily as needed  (pain to affected area)., Disp: 1 g, Rfl: 5   hydrALAZINE (APRESOLINE) 100 MG tablet, TAKE 1 TABLET BY MOUTH 3 TIMES  DAILY, Disp: 270 tablet, Rfl: 0   Insulin Pen Needle 32G X 4 MM MISC, Use as directed with insulin daily, Disp: 100 each, Rfl: 1   JARDIANCE 10 MG TABS tablet, Take 10 mg by mouth daily., Disp: , Rfl:    LANTUS SOLOSTAR 100 UNIT/ML Solostar Pen, INJECT SUBCUTANEOUSLY 60 TO 80 UNITS DAILY, Disp: 75 mL, Rfl: 3   lidocaine (LIDODERM) 5 %, Place 1 patch onto the skin daily. Remove & Discard patch  within 24 h or as directed by MD, Disp: 30 patch,  Rfl: 0   losartan (COZAAR) 100 MG tablet, Take 100 mg by mouth daily., Disp: , Rfl:    Multiple Vitamin (MULTIVITAMIN) tablet, Take 1 tablet by mouth daily., Disp: , Rfl:    nystatin cream (MYCOSTATIN), Apply 1 Application topically 2 (two) times daily., Disp: 30 g, Rfl: 0   PENNSAID 2 % SOLN, SMARTSIG:1 Pump Topical Twice Daily PRN, Disp: , Rfl:    Semaglutide, 2 MG/DOSE, (OZEMPIC, 2 MG/DOSE,) 8 MG/3ML SOPN, INJECT 2 MG INTO THE SKIN ONCE A WEEK., Disp: 9 mL, Rfl: 1   venlafaxine XR (EFFEXOR-XR) 75 MG 24 hr capsule, TAKE 1 CAPSULE BY MOUTH  DAILY WITH BREAKFAST, Disp: 90 capsule, Rfl: 3   gabapentin (NEURONTIN) 300 MG capsule, Take 1 capsule (300 mg total) by mouth 3 (three) times daily. TAKE 1 CAPSULE BY MOUTH IN  THE MORNING AND 1 CAPSULE  BY MOUTH IN THE AFTERNOON  TAKE 2 CAPSULES BY MOUTH  BEFORE BEDTIME, Disp: 270 capsule, Rfl: 1  Patient Active Problem List   Diagnosis Date Noted   Elevated sedimentation rate 08/01/2021   Rheumatoid factor positive 08/01/2021   Bilateral wrist pain 08/01/2021   Chronic kidney disease, stage IV (severe) (HCC) 04/25/2021   Secondary hyperparathyroidism of renal origin (HCC) 10/27/2020   Mild intermittent asthma without complication 10/27/2020   Hot flashes due to menopause 07/08/2020   Osteoarthritis of knee 07/08/2020   Pain in joint of left shoulder 07/08/2020   Lumbar radiculopathy 11/10/2019    Type 2 diabetes mellitus with hyperglycemia, with long-term current use of insulin (HCC) 09/22/2019   Anemia in chronic kidney disease 08/31/2019   Benign hypertensive kidney disease with chronic kidney disease 08/31/2019   Proteinuria 08/31/2019   Type 2 diabetes mellitus with diabetic chronic kidney disease (HCC) 08/31/2019   Hyperlipidemia associated with type 2 diabetes mellitus (HCC) 08/10/2019   Hemoptysis 11/15/2018   Class 2 severe obesity with serious comorbidity and body mass index (BMI) of 35.0 to 35.9 in adult (HCC) 09/22/2018   Diabetic retinopathy of both eyes associated with type 2 diabetes mellitus (HCC) 04/11/2018   Essential hypertension 03/07/2015   DM type 2 with diabetic peripheral neuropathy (HCC) 05/26/2014    Past Surgical History:  Procedure Laterality Date   BREAST REDUCTION SURGERY  2001   CESAREAN SECTION     COLONOSCOPY WITH PROPOFOL N/A 08/15/2018   Procedure: COLONOSCOPY WITH PROPOFOL;  Surgeon: Wyline Mood, MD;  Location: [facility] ENDOSCOPY;  Service: Gastroenterology;  Laterality: N/A;   ESOPHAGOGASTRODUODENOSCOPY (EGD) WITH PROPOFOL N/A 08/15/2018   Procedure: ESOPHAGOGASTRODUODENOSCOPY (EGD) WITH PROPOFOL;  Surgeon: Wyline Mood, MD;  Location: [facility] ENDOSCOPY;  Service: Gastroenterology;  Laterality: N/A;   GIVENS CAPSULE STUDY N/A 09/08/2018   Procedure: GIVENS CAPSULE STUDY;  Surgeon: Wyline Mood, MD;  Location: [facility] ENDOSCOPY;  Service: Gastroenterology;  Laterality: N/A;   REDUCTION MAMMAPLASTY Bilateral 2000   SHOULDER SURGERY Left    TUBAL LIGATION      Family History  Problem Relation Age of Onset   Diabetes Father    Hyperlipidemia Father    Hypertension Father    Cancer Maternal Aunt 31       leukemia - died   Diabetes Maternal Grandmother    Cancer Maternal Grandmother        breast cancer   Stroke Maternal Grandmother    Breast cancer Maternal Grandmother    Diabetes Daughter        Pre-diabetes   Diabetes Maternal Grandfather     Hyperlipidemia Maternal Grandfather  Hypertension Maternal Grandfather    Cancer Maternal Aunt 50       breast cancer - died   Ovarian cancer Neg Hx    Colon cancer Neg Hx     Social History   Tobacco Use   Smoking status: Never   Smokeless tobacco: Never  Vaping Use   Vaping Use: Never used  Substance Use Topics   Alcohol use: Yes    Alcohol/week: 0.0 standard drinks of alcohol    Comment: occ   Drug use: No     Allergies  Allergen Reactions   Hydrocodone Itching    Health Maintenance  Topic Date Due   MAMMOGRAM  01/11/2023   FOOT EXAM  01/30/2023   COVID-19 Vaccine (10 - 2023-24 season) 03/17/2023 (Originally 01/05/2023)   HEMOGLOBIN A1C  05/29/2023   INFLUENZA VACCINE  05/30/2023   OPHTHALMOLOGY EXAM  07/17/2023   Diabetic kidney evaluation - Urine ACR  08/21/2023   Diabetic kidney evaluation - eGFR measurement  11/29/2023   DTaP/Tdap/Td (2 - Tdap) 05/26/2024   PAP SMEAR-Modifier  08/23/2024   COLONOSCOPY (Pts 45-77yrs Insurance coverage will need to be confirmed)  08/15/2028   Hepatitis C Screening  Completed   HIV Screening  Completed   Zoster Vaccines- Shingrix  Completed   HPV VACCINES  Aged Out    Chart Review Today: I personally reviewed active problem list, medication list, allergies, family history, social history, health maintenance, notes from last encounter, lab results, imaging with the patient/caregiver today.   Review of Systems  Constitutional: Negative.   HENT: Negative.    Eyes: Negative.   Respiratory: Negative.    Cardiovascular: Negative.   Gastrointestinal: Negative.   Endocrine: Negative.   Genitourinary: Negative.   Musculoskeletal:  Positive for back pain. Negative for gait problem, joint swelling, myalgias, neck pain and neck stiffness.  Skin: Negative.   Allergic/Immunologic: Negative.   Neurological: Negative.   Hematological: Negative.   Psychiatric/Behavioral: Negative.    All other systems reviewed and are negative.     Objective:   Vitals:   03/01/23 1534  BP: 122/70  Pulse: 100  Resp: 16  Temp: 97.7 F (36.5 C)  TempSrc: Oral  SpO2: 98%  Weight: 207 lb 9.6 oz (94.2 kg)  Height: 5\' 4"  (1.626 m)    Body mass index is 35.63 kg/m.  Physical Exam Vitals and nursing note reviewed.  Constitutional:      General: She is not in acute distress.    Appearance: Normal appearance. She is well-developed. She is obese. She is not ill-appearing, toxic-appearing or diaphoretic.  HENT:     Head: Normocephalic and atraumatic.     Nose: Nose normal.  Eyes:     General:        Right eye: No discharge.        Left eye: No discharge.     Comments: Left eye with conjunctival redness - eye dx/procedure  Neck:     Trachea: No tracheal deviation.  Cardiovascular:     Rate and Rhythm: Normal rate and regular rhythm.  Pulmonary:     Effort: Pulmonary effort is normal. No respiratory distress.     Breath sounds: No stridor.  Skin:    General: Skin is warm and dry.     Findings: No rash.  Neurological:     Mental Status: She is alert.     Motor: No abnormal muscle tone.     Coordination: Coordination normal.     Gait: Gait abnormal (antalgic).  Psychiatric:        Mood and Affect: Mood normal.        Behavior: Behavior normal.         Assessment & Plan:   Problem List Items Addressed This Visit       Cardiovascular and Mediastinum   Essential hypertension - Primary (Chronic)    BP at goal today on current meds - some of which are managed by nephrology/cardiology Continue losartan, norvasc, hydralazine, carvedilol BP Readings from Last 3 Encounters:  03/01/23 122/70  11/28/22 110/60  10/16/22 136/72       Relevant Orders   COMPLETE METABOLIC PANEL WITH GFR     Endocrine   DM type 2 with diabetic peripheral neuropathy (HCC)    Neuropathy sx stable on gabapentin, per nephrology max dose of gabapentin for her in 300 bid Last rx was for higher dose - will ensure she is at renal  dosing       Relevant Medications   gabapentin (NEURONTIN) 300 MG capsule   Type 2 diabetes mellitus with hyperglycemia, with long-term current use of insulin (HCC)    Last A1C elevated Nephrology added jardiance, managed on lantus and ozempic  Inconsistent dosing and CBG monitoring by pt, lantus 60 units, changing doses or holding doses based on sugars off of CBG - which she has not been wearing regularly Some lows on libre 2 report Adjusted insulin to start lower dose daily and titrate up to fasting readings 80-150, if having lows will need to decrease dose - more education about insulin dosing and monitoring 2 week f/up Foot exam done today Recent pertinent labs: Lab Results  Component Value Date   HGBA1C 7.7 (H) 11/28/2022   HGBA1C 6.6 08/20/2022   HGBA1C 5.8 (H) 01/29/2022   Lab Results  Component Value Date   MICROALBUR 38.7 08/10/2019   LDLCALC 60 11/28/2022   CREATININE 2.29 (H) 11/28/2022   New libre 3 samples given x2, rx sent in Additional education and handouts given and reviewed with pt         Relevant Medications   Continuous Glucose Sensor (FREESTYLE LIBRE 3 SENSOR) MISC   Other Relevant Orders   Hemoglobin A1c   Microalbumin / creatinine urine ratio   COMPLETE METABOLIC PANEL WITH GFR   Secondary hyperparathyroidism of renal origin [facility])    Per nephrology, last ov and labs reviewed        Genitourinary   Anemia in chronic kidney disease    Blood counts have been stable - monitored by nephrology  Hgb good, not requiring ESAs - on ferrous sulfate daily, managed by PCP      Chronic kidney disease, stage IV (severe) (HCC)    Per nephrology, last ov and labs reviewed, she has f/up appt in a few months with specialists Lab Results  Component Value Date   EGFR 25 (L) 11/28/2022   EGFR 26 08/20/2022   EGFR 24 (L) 01/29/2022  CKD IV (G4/A3). Likely 2/2 DKD. 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 She has  thought about transplant and if K/P is an option, she would consider, but if K/P not an option she would not consider. BL Cr: 2.2-2.4 and most recently 2.2 Proteinuria/Albuminuria: 568 mg/gm on UACR 07/2022 KFRE: KFRE 2-yr risk 16% and 5-yr risk 41% (improved from last visit) - continue to avoid nephrotoxins, including NSAIDs - on max dose losartan - on SGLT2i, but need to work to see if she can stay  on it since insurance is changing coverage next year - max dose of gabapentin for her in 300 bid, which is what she is currently taking - will plan to inquire with transplant as we get closer to eGFR 20 about whether she is K/P candidate      Relevant Orders   Microalbumin / creatinine urine ratio   COMPLETE METABOLIC PANEL WITH GFR     Other   Class 2 severe obesity with serious comorbidity and body mass index (BMI) of 35.0 to 35.9 in adult (HCC)    Stable weight and BMI Wt Readings from Last 5 Encounters:  03/01/23 207 lb 9.6 oz (94.2 kg)  11/28/22 207 lb 11.2 oz (94.2 kg)  10/16/22 207 lb 1.6 oz (93.9 kg)  09/13/22 208 lb (94.3 kg)  07/30/22 204 lb 12.8 oz (92.9 kg)   BMI Readings from Last 5 Encounters:  03/01/23 35.63 kg/m  11/28/22 35.65 kg/m  10/16/22 35.55 kg/m  09/13/22 35.70 kg/m  07/30/22 35.15 kg/m        Other Visit Diagnoses     Encounter for screening mammogram for malignant neoplasm of breast       Relevant Orders   MM 3D SCREENING MAMMOGRAM BILATERAL BREAST   Low back pain without sciatica, unspecified back pain laterality, unspecified chronicity       brings in MRI imaging up cervical and lumbar spine, pain and spasms, just released from ortho and seeing chiropractor on lido patches and topical gabapentin   Relevant Medications   Gabapentin (NEURAPTINE) 10 % CREA   Hypoglycemia       discussed how to avoid low blood sugars, confirm lows on CGM with glucometer        Return in about 3 months (around 06/01/2023) for virtual insulin/DM f/up  and 3 month  routine f/up ov dm.   Danelle Berry, PA-C 03/01/23 4:11 PM

## 2023-03-01 NOTE — Patient Instructions (Signed)
Long Acting Insulin:  Cut back your nighttime insulin dose to 30 or 40   Begin checking your fasting blood sugar every morning - keep record on log or make sure always bring meter with you to appointments.  Goal range for fasting blood sugars:  80-150 - keep insulin dose the same    >150 - Increase insulin dose by 2-5 units and do not adjust again for 3 days    <80 or under 100 and feeling symptomatic - decrease insulin dose by 2-5 units and do not adjust again for 3 days  Example of how to titrate:  Fasting blood sugars around 180's every morning and I am taking 18 units daily Increase dose to 20 or 21, give at the same time for the next 3 days, and continue to watch morning fasting blood sugars. If after 3 days the blood sugar has not come into normal range, then can increase again, + 2 or 3 = 23 or 24 units, and then give 24 units daily for the next 3-4 days and continue to monitor the fasting blood sugars.  Please reach out to me and let me know when you get to fasting blood sugars in goal range and what dose. We will record the dose in your chart, and keep you at that dose.  Follow up in 2-4 weeks. Follow up sooner if blood sugar too high or too low. Bring blood sugar log or glucometer with you to office visit.

## 2023-03-01 NOTE — Assessment & Plan Note (Addendum)
Last A1C elevated Nephrology added jardiance, managed on lantus and ozempic  Inconsistent dosing and CBG monitoring by pt, lantus 60 units, changing doses or holding doses based on sugars off of CBG - which she has not been wearing regularly Some lows on libre 2 report Adjusted insulin to start lower dose daily and titrate up to fasting readings 80-150, if having lows will need to decrease dose - more education about insulin dosing and monitoring 2 week f/up Foot exam done today Recent pertinent labs: Lab Results  Component Value Date   HGBA1C 7.7 (H) 11/28/2022   HGBA1C 6.6 08/20/2022   HGBA1C 5.8 (H) 01/29/2022   Lab Results  Component Value Date   MICROALBUR 38.7 08/10/2019   LDLCALC 60 11/28/2022   CREATININE 2.29 (H) 11/28/2022   New libre 3 samples given x2, rx sent in Additional education and handouts given and reviewed with pt

## 2023-03-01 NOTE — Assessment & Plan Note (Signed)
Per nephrology, last ov and labs reviewed

## 2023-03-01 NOTE — Assessment & Plan Note (Addendum)
Per nephrology, last ov and labs reviewed, she has f/up appt in a few months with specialists Lab Results  Component Value Date   EGFR 25 (L) 11/28/2022   EGFR 26 08/20/2022   EGFR 24 (L) 01/29/2022   CKD IV (G4/A3). Likely 2/2 DKD. 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 She has thought about transplant and if K/P is an option, she would consider, but if K/P not an option she would not consider. BL Cr: 2.2-2.4 and most recently 2.2 Proteinuria/Albuminuria: 568 mg/gm on UACR 07/2022 KFRE: KFRE 2-yr risk 16% and 5-yr risk 41% (improved from last visit) - continue to avoid nephrotoxins, including NSAIDs - on max dose losartan - on SGLT2i, but need to work to see if she can stay on it since insurance is changing coverage next year - max dose of gabapentin for her in 300 bid, which is what she is currently taking - will plan to inquire with transplant as we get closer to eGFR 20 about whether she is K/P candidate

## 2023-03-01 NOTE — Assessment & Plan Note (Addendum)
Neuropathy sx stable on gabapentin, per nephrology max dose of gabapentin for her in 300 bid Last rx was for higher dose - will ensure she is at renal dosing

## 2023-03-01 NOTE — Assessment & Plan Note (Signed)
BP at goal today on current meds - some of which are managed by nephrology/cardiology Continue losartan, norvasc, hydralazine, carvedilol BP Readings from Last 3 Encounters:  03/01/23 122/70  11/28/22 110/60  10/16/22 136/72

## 2023-03-01 NOTE — Assessment & Plan Note (Signed)
Stable weight and BMI Wt Readings from Last 5 Encounters:  03/01/23 207 lb 9.6 oz (94.2 kg)  11/28/22 207 lb 11.2 oz (94.2 kg)  10/16/22 207 lb 1.6 oz (93.9 kg)  09/13/22 208 lb (94.3 kg)  07/30/22 204 lb 12.8 oz (92.9 kg)   BMI Readings from Last 5 Encounters:  03/01/23 35.63 kg/m  11/28/22 35.65 kg/m  10/16/22 35.55 kg/m  09/13/22 35.70 kg/m  07/30/22 35.15 kg/m

## 2023-03-02 LAB — COMPLETE METABOLIC PANEL WITH GFR
AG Ratio: 1.3 (calc) (ref 1.0–2.5)
ALT: 23 U/L (ref 6–29)
AST: 17 U/L (ref 10–35)
Albumin: 4 g/dL (ref 3.6–5.1)
Alkaline phosphatase (APISO): 84 U/L (ref 37–153)
BUN/Creatinine Ratio: 15 (calc) (ref 6–22)
BUN: 38 mg/dL — ABNORMAL HIGH (ref 7–25)
CO2: 24 mmol/L (ref 20–32)
Calcium: 9.4 mg/dL (ref 8.6–10.4)
Chloride: 108 mmol/L (ref 98–110)
Creat: 2.5 mg/dL — ABNORMAL HIGH (ref 0.50–1.03)
Globulin: 3.1 g/dL (calc) (ref 1.9–3.7)
Glucose, Bld: 157 mg/dL — ABNORMAL HIGH (ref 65–99)
Potassium: 5.2 mmol/L (ref 3.5–5.3)
Sodium: 140 mmol/L (ref 135–146)
Total Bilirubin: 0.5 mg/dL (ref 0.2–1.2)
Total Protein: 7.1 g/dL (ref 6.1–8.1)
eGFR: 22 mL/min/{1.73_m2} — ABNORMAL LOW (ref >=60)

## 2023-03-02 LAB — HEMOGLOBIN A1C
Hgb A1c MFr Bld: 6.6 % of total Hgb — ABNORMAL HIGH (ref <5.7)
Mean Plasma Glucose: 143 mg/dL
eAG (mmol/L): 7.9 mmol/L

## 2023-03-02 LAB — MICROALBUMIN / CREATININE URINE RATIO
Creatinine, Urine: 77 mg/dL (ref 20–275)
Microalb Creat Ratio: 436 mg/g creat — ABNORMAL HIGH (ref <30)
Microalb, Ur: 33.6 mg/dL

## 2023-03-04 ENCOUNTER — Encounter: Payer: Self-pay | Admitting: Family Medicine

## 2023-03-06 ENCOUNTER — Other Ambulatory Visit: Payer: Self-pay | Admitting: Family Medicine

## 2023-03-06 DIAGNOSIS — N951 Menopausal and female climacteric states: Secondary | ICD-10-CM

## 2023-03-11 LAB — HM DIABETES EYE EXAM

## 2023-03-14 ENCOUNTER — Other Ambulatory Visit: Payer: Self-pay | Admitting: Family Medicine

## 2023-03-14 DIAGNOSIS — I10 Essential (primary) hypertension: Secondary | ICD-10-CM

## 2023-03-18 ENCOUNTER — Telehealth (INDEPENDENT_AMBULATORY_CARE_PROVIDER_SITE_OTHER): Payer: 59 | Admitting: Family Medicine

## 2023-03-18 DIAGNOSIS — Z794 Long term (current) use of insulin: Secondary | ICD-10-CM

## 2023-03-18 DIAGNOSIS — E1165 Type 2 diabetes mellitus with hyperglycemia: Secondary | ICD-10-CM | POA: Diagnosis not present

## 2023-03-18 NOTE — Progress Notes (Signed)
Name: Janice Jennings   MRN: [id number]    DOB: December 01, 1970   Date:03/18/2023       Progress Note  Subjective:    I connected with  Davy Pique  on 03/18/23 at 10:40 AM EDT by a video enabled telemedicine application and verified that I am speaking with the correct person using two identifiers.  I discussed the limitations of evaluation and management by telemedicine and the availability of in person appointments. The patient expressed understanding and agreed to proceed. Staff also discussed with the patient that there may be a patient responsible charge related to this service. Patient Location: work  Restaurant manager, fast food Location: [facility] Additional Individuals present: none  Chief Complaint  Patient presents with   Follow-up   Diabetes    BS with Libre sensor were higher than usual. Needs new BS kit meter    Janice Jennings is a 52 y.o. female, presents for virtual visit for routine follow up on the conditions listed above.  Libre meter samples given at last office visit she has not sent in the readings and is unsure how to navigate them Overall her sugar readings were higher than what they normally are she does ask for a new glucometer and strips    Patient Active Problem List   Diagnosis Date Noted   Elevated sedimentation rate 08/01/2021   Rheumatoid factor positive 08/01/2021   Bilateral wrist pain 08/01/2021   Chronic kidney disease, stage IV (severe) (HCC) 04/25/2021   Secondary hyperparathyroidism of renal origin (HCC) 10/27/2020   Mild intermittent asthma without complication 10/27/2020   Hot flashes due to menopause 07/08/2020   Osteoarthritis of knee 07/08/2020   Pain in joint of left shoulder 07/08/2020   Lumbar radiculopathy 11/10/2019   Type 2 diabetes mellitus with hyperglycemia, with long-term current use of insulin (HCC) 09/22/2019   Anemia in chronic kidney disease 08/31/2019   Benign hypertensive kidney disease with chronic kidney disease 08/31/2019    Proteinuria 08/31/2019   Type 2 diabetes mellitus with diabetic chronic kidney disease (HCC) 08/31/2019   Hyperlipidemia associated with type 2 diabetes mellitus (HCC) 08/10/2019   Hemoptysis 11/15/2018   Class 2 severe obesity with serious comorbidity and body mass index (BMI) of 35.0 to 35.9 in adult [facility]) 09/22/2018   Diabetic retinopathy of both eyes associated with type 2 diabetes mellitus (HCC) 04/11/2018   Essential hypertension 03/07/2015   DM type 2 with diabetic peripheral neuropathy (HCC) 05/26/2014    Current Outpatient Medications:    albuterol (VENTOLIN HFA) 108 (90 Base) MCG/ACT inhaler, Inhale 2 puffs into the lungs every 4 (four) hours as needed for wheezing or shortness of breath., Disp: 1 each, Rfl: 1   amLODipine (NORVASC) 10 MG tablet, TAKE 1 TABLET BY MOUTH DAILY, Disp: 90 tablet, Rfl: 0   atorvastatin (LIPITOR) 80 MG tablet, Take 1 tablet (80 mg total) by mouth at bedtime., Disp: 90 tablet, Rfl: 3   carvedilol (COREG) 12.5 MG tablet, TAKE 1 TABLET BY MOUTH TWICE  DAILY WITH MEALS, Disp: 180 tablet, Rfl: 3   cloNIDine (CATAPRES - DOSED IN MG/24 HR) 0.1 mg/24hr patch, APPLY 1 PATCH TOPICALLY TO SKIN  ONCE WEEKLY, Disp: 4 patch, Rfl: 0   Continuous Glucose Sensor (FREESTYLE LIBRE 3 SENSOR) MISC, 1 Device by Does not apply route every 14 (fourteen) days., Disp: 6 each, Rfl: 1   cyclobenzaprine (FLEXERIL) 5 MG tablet, TAKE 1 TABLET BY MOUTH THREE TIMES A DAY AS NEEDED FOR MUSCLE SPASMS, Disp: 30 tablet, Rfl: 1  ezetimibe (ZETIA) 10 MG tablet, TAKE 1 TABLET BY MOUTH  DAILY, Disp: 90 tablet, Rfl: 3   ferrous sulfate 325 (65 FE) MG tablet, Take 325 mg by mouth daily with breakfast., Disp: , Rfl:    fluconazole (DIFLUCAN) 150 MG tablet, Take 1 tablet (150 mg total) by mouth every 3 (three) days as needed (for vaginal itching/yeast infection sx)., Disp: 2 tablet, Rfl: 0   Gabapentin (NEURAPTINE) 10 % CREA, Apply 1 Application topically daily as needed (pain to affected area).,  Disp: 1 g, Rfl: 5   gabapentin (NEURONTIN) 300 MG capsule, Take 1 capsule (300 mg total) by mouth 2 (two) times daily., Disp: 180 capsule, Rfl: 3   hydrALAZINE (APRESOLINE) 100 MG tablet, TAKE 1 TABLET BY MOUTH 3 TIMES  DAILY, Disp: 270 tablet, Rfl: 0   Insulin Pen Needle 32G X 4 MM MISC, Use as directed with insulin daily, Disp: 100 each, Rfl: 1   JARDIANCE 10 MG TABS tablet, Take 10 mg by mouth daily., Disp: , Rfl:    LANTUS SOLOSTAR 100 UNIT/ML Solostar Pen, INJECT SUBCUTANEOUSLY 60 TO 80 UNITS DAILY, Disp: 75 mL, Rfl: 3   lidocaine (LIDODERM) 5 %, Place 1 patch onto the skin daily. Remove & Discard patch  within 24 h or as directed by MD, Disp: 30 patch, Rfl: 0   losartan (COZAAR) 100 MG tablet, Take 100 mg by mouth daily., Disp: , Rfl:    Multiple Vitamin (MULTIVITAMIN) tablet, Take 1 tablet by mouth daily., Disp: , Rfl:    nystatin cream (MYCOSTATIN), Apply 1 Application topically 2 (two) times daily., Disp: 30 g, Rfl: 0   PENNSAID 2 % SOLN, SMARTSIG:1 Pump Topical Twice Daily PRN, Disp: , Rfl:    Semaglutide, 2 MG/DOSE, (OZEMPIC, 2 MG/DOSE,) 8 MG/3ML SOPN, INJECT 2 MG INTO THE SKIN ONCE A WEEK., Disp: 9 mL, Rfl: 1   venlafaxine XR (EFFEXOR-XR) 75 MG 24 hr capsule, TAKE 1 CAPSULE BY MOUTH  DAILY WITH BREAKFAST, Disp: 90 capsule, Rfl: 3 Allergies  Allergen Reactions   Hydrocodone Itching    Past Surgical History:  Procedure Laterality Date   BREAST REDUCTION SURGERY  2001   CESAREAN SECTION     COLONOSCOPY WITH PROPOFOL N/A 08/15/2018   Procedure: COLONOSCOPY WITH PROPOFOL;  Surgeon: Wyline Mood, MD;  [facility] END[facility]COPY;  Service: Gastroenterology;  Laterality: N/A;   ESOPHAGOGASTRODUODENOSCOPY (EGD) WITH PROPOFOL N/A 08/15/2018   Procedure: ESOPHAGOGASTRODUODENOSCOPY (EGD) WITH PROPOFOL;  Surgeon: Wyline Mood, MD;  Location: [facility] ENDOSCOPY;  Service: Gastroenterology;  Laterality: N/A;   GIVENS CAPSULE STUDY N/A 09/08/2018   Procedure: GIVENS CAPSULE STUDY;  Surgeon: Wyline Mood, MD;  Location: [facility] ENDOSCOPY;  Service: Gastroenterology;  Laterality: N/A;   REDUCTION MAMMAPLASTY Bilateral 2000   SHOULDER SURGERY Left    TUBAL LIGATION     Family History  Problem Relation Age of Onset   Diabetes Father    Hyperlipidemia Father    Hypertension Father    Cancer Maternal Aunt 48       leukemia - died   Diabetes Maternal Grandmother    Cancer Maternal Grandmother        breast cancer   Stroke Maternal Grandmother    Breast cancer Maternal Grandmother    Diabetes Daughter        Pre-diabetes   Diabetes Maternal Grandfather    Hyperlipidemia Maternal Grandfather    Hypertension Maternal Grandfather    Cancer Maternal Aunt 50       breast cancer - died  Ovarian cancer Neg Hx    Colon cancer Neg Hx    Social History   Socioeconomic History   Marital status: Single    Spouse name: Not on file   Number of children: 2   Years of education: 38   Highest education level: Master's degree (e.g., MA, MS, MEng, MEd, MSW, MBA)  Occupational History   Occupation: Veterinary surgeon at [employer]    Employer: [employer]  Tobacco Use   Smoking status: Never   Smokeless tobacco: Never  Vaping Use   Vaping Use: Never used  Substance and Sexual Activity   Alcohol use: Yes    Alcohol/week: 0.0 standard drinks of alcohol    Comment: occ   Drug use: No   Sexual activity: Not on file  Other Topics Concern   Not on file  Social History Narrative   Anneta grew up in [place], [place] and also in [place]. She attending the [employer] [place] and obtained her Bachelors in Psychology. She then attended [employer] and obtained her Masters in Psychology. She works as a Veterinary surgeon at [employer]. She is divorced. She lives at home with her two children, Darius age 68 and Mi'Niya age 42. They have a dog named Coco who she describes as having separation anxiety. Joury enjoys activities with the children. They enjoy outdoor activities,  watching movies.     Social Determinants of Health   Financial Resource Strain: Low Risk  (03/01/2023)   Overall Financial Resource Strain (CARDIA)    Difficulty of Paying Living Expenses: Not very hard  Food Insecurity: No Food Insecurity (03/01/2023)   Hunger Vital Sign    Worried About Running Out of Food in the Last Year: Never true    Ran Out of Food in the Last Year: Never true  Transportation Needs: No Transportation Needs (03/01/2023)   PRAPARE - Administrator, Civil Service (Medical): No    Lack of Transportation (Non-Medical): No  Physical Activity: Insufficiently Active (03/01/2023)   Exercise Vital Sign    Days of Exercise per Week: 3 days    Minutes of Exercise per Session: 10 min  Stress: No Stress Concern Present (03/01/2023)   [employer] of Occupational Health - Occupational Stress Questionnaire    Feeling of Stress : Not at all  Social Connections: Moderately Isolated (03/01/2023)   Social Connection and Isolation Panel [NHANES]    Frequency of Communication with Friends and Family: More than three times a week    Frequency of Social Gatherings with Friends and Family: Once a week    Attends Religious Services: More than 4 times per year    Active Member of [employer] or Organizations: No    Attends Engineer, structural: Not on file    Marital Status: Divorced  Intimate Partner Violence: Not At Risk (08/23/2021)   Humiliation, Afraid, Rape, and Kick questionnaire    Fear of Current or Ex-Partner: No    Emotionally Abused: No    Physically Abused: No    Sexually Abused: No    Chart Review Today: I personally reviewed active problem list, medication list, allergies, family history, social history, health maintenance, notes from last encounter, lab results, imaging with the patient/caregiver today.   Review of Systems  Constitutional: Negative.   HENT: Negative.    Eyes: Negative.   Respiratory: Negative.    Cardiovascular: Negative.    Gastrointestinal: Negative.   Endocrine: Negative.   Genitourinary: Negative.   Musculoskeletal: Negative.  Skin: Negative.   Allergic/Immunologic: Negative.   Neurological: Negative.   Hematological: Negative.   Psychiatric/Behavioral: Negative.    All other systems reviewed and are negative.     Objective:    Virtual encounter, vitals limited, only able to obtain the following There were no vitals filed for this visit. There is no height or weight on file to calculate BMI. Nursing Note and Vital Signs reviewed.  Physical Exam Vitals and nursing note reviewed.  Constitutional:      Appearance: Normal appearance.  Neurological:     Mental Status: She is alert.  Psychiatric:        Mood and Affect: Mood normal.     PE limited by virtual encounter  No results found for this or any previous visit (from the past 72 hour(s)).  PHQ2/9:    03/18/2023    9:13 AM 03/01/2023    3:26 PM 11/28/2022    9:48 AM 10/16/2022    9:36 AM 10/03/2022    3:00 PM  Depression screen PHQ 2/9  Decreased Interest 0 0 0 0 0  Down, Depressed, Hopeless 0 0 0 0 0  PHQ - 2 Score 0 0 0 0 0  Altered sleeping 0 0 0 0 0  Tired, decreased energy 0 0 0 0 0  Change in appetite 0 0 0 0 0  Feeling bad or failure about yourself  0 0 0 0 0  Trouble concentrating 0 0 0 0 0  Moving slowly or fidgety/restless 0 0 0 0 0  Suicidal thoughts 0 0 0 0 0  PHQ-9 Score 0 0 0 0 0  Difficult doing work/chores Not difficult at all Not difficult at all Not difficult at all Not difficult at all Not difficult at all   PHQ-2/9 Result is neg, reviewed today  Fall Risk:    03/18/2023    9:12 AM 03/01/2023    3:26 PM 11/28/2022    9:48 AM 10/16/2022    9:36 AM 10/03/2022    2:59 PM  Fall Risk   Falls in the past year? 0 0 1 0 0  Number falls in past yr: 0 0 1 0 0  Injury with Fall? 0 0 0 0 0  Risk for fall due to : No Fall Risks No Fall Risks Impaired balance/gait No Fall Risks No Fall Risks  Follow up Falls  prevention discussed;Education provided;Falls evaluation completed Falls prevention discussed;Education provided;Falls evaluation completed Falls prevention discussed;Education provided;Falls evaluation completed Falls prevention discussed;Education provided;Falls evaluation completed Falls prevention discussed;Education provided;Falls evaluation completed     Assessment and Plan:      ICD-10-CM   1. Type 2 diabetes mellitus with hyperglycemia, with long-term current use of insulin (HCC)  E11.65 Blood Glucose Monitoring Suppl DEVI   Z79.4 Glucose Blood (BLOOD GLUCOSE TEST STRIPS) STRP    Lancets Misc. MISC    2. Uncontrolled diabetes mellitus with hyperglycemia, with long-term current use of insulin (HCC)  E11.65 Blood Glucose Monitoring Suppl DEVI   Z79.4 Glucose Blood (BLOOD GLUCOSE TEST STRIPS) STRP    Lancets Misc. MISC   keep 50 units insulin at bedtime, get glucometer and compare to CGM, if morning sugars >150 then increase nighttime insulin to 52, if more low readings  Then pt will likely need to start basal insulin dosing BID to avoid hypoglycemia. She can send in graphs/readings from [facility] app for me to help adjust her readings - none provided today      [E11.65, Z79.4] - dx for meter  per insurance coverage Needs new glucometer orders - she did try CGM - having trouble navigating the app    I discussed the assessment and treatment plan with the patient. The patient was provided an opportunity to ask questions and all were answered. The patient agreed with the plan and demonstrated an understanding of the instructions.  The patient was advised to call back or seek an in-person evaluation if the symptoms worsen or if the condition fails to improve as anticipated.  I provided 18  minutes of non-face-to-face time during this encounter.  Danelle Berry, PA-C 03/18/23 10:46 AM

## 2023-03-22 ENCOUNTER — Other Ambulatory Visit: Payer: Self-pay | Admitting: Family Medicine

## 2023-03-22 DIAGNOSIS — N951 Menopausal and female climacteric states: Secondary | ICD-10-CM

## 2023-03-26 ENCOUNTER — Other Ambulatory Visit: Payer: Self-pay | Admitting: Family Medicine

## 2023-03-26 DIAGNOSIS — E1142 Type 2 diabetes mellitus with diabetic polyneuropathy: Secondary | ICD-10-CM

## 2023-03-26 DIAGNOSIS — E1165 Type 2 diabetes mellitus with hyperglycemia: Secondary | ICD-10-CM

## 2023-03-26 MED ORDER — BLOOD GLUCOSE TEST VI STRP
1.0000 | ORAL_STRIP | Freq: Three times a day (TID) | 3 refills | Status: AC
Start: 2023-03-26 — End: ?

## 2023-03-26 MED ORDER — LANCETS MISC. MISC
1.0000 | Freq: Three times a day (TID) | 5 refills | Status: AC
Start: 2023-03-26 — End: ?

## 2023-03-26 MED ORDER — BLOOD GLUCOSE MONITORING SUPPL DEVI
1.0000 | Freq: Three times a day (TID) | 0 refills | Status: AC
Start: 2023-03-26 — End: ?

## 2023-03-26 NOTE — Telephone Encounter (Signed)
Requested Prescriptions  Pending Prescriptions Disp Refills   venlafaxine XR (EFFEXOR-XR) 75 MG 24 hr capsule [Pharmacy Med Name: Venlafaxine HCl ER 75 MG Oral Capsule Extended Release 24 Hour] 90 capsule 3    Sig: TAKE 1 CAPSULE BY MOUTH DAILY  WITH BREAKFAST     Psychiatry: Antidepressants - SNRI - desvenlafaxine & venlafaxine Failed - 03/22/2023 10:10 PM      Failed - Cr in normal range and within 360 days    Creat  Date Value Ref Range Status  03/01/2023 2.50 (H) 0.50 - 1.03 mg/dL Final   Creatinine,U  Date Value Ref Range Status  10/20/2014 83.0 mg/dL Final   Creatinine, Urine  Date Value Ref Range Status  03/01/2023 77 20 - 275 mg/dL Final         Failed - Lipid Panel in normal range within the last 12 months    Cholesterol  Date Value Ref Range Status  11/28/2022 126 <200 mg/dL Final   LDL Cholesterol (Calc)  Date Value Ref Range Status  11/28/2022 60 mg/dL (calc) Final    Comment:    Reference range: <100 . Desirable range <100 mg/dL for primary prevention;   <70 mg/dL for patients with CHD or diabetic patients  with > or = 2 CHD risk factors. Marland Kitchen LDL-C is now calculated using the Martin-Hopkins  calculation, which is a validated novel method providing  better accuracy than the Friedewald equation in the  estimation of LDL-C.  Horald Pollen et al. Lenox Ahr. 1610;960(45): 2061-2068  (http://education.[facility].com/faq/FAQ164)    HDL  Date Value Ref Range Status  11/28/2022 51 > OR = 50 mg/dL Final   Triglycerides  Date Value Ref Range Status  11/28/2022 70 <150 mg/dL Final         Passed - Last BP in normal range    BP Readings from Last 1 Encounters:  03/01/23 122/70         Passed - Valid encounter within last 6 months    Recent Outpatient Visits           1 week ago Type 2 diabetes mellitus with hyperglycemia, with long-term current use of insulin [facility])   [place] [facility] Danelle Berry, PA-C   3 weeks ago Essential  hypertension   [facility] Danelle Berry, PA-C   3 months ago DM type 2 with diabetic peripheral neuropathy [facility])   [facility] M[facility]um, Oswaldo Conroy, PA-C   5 months ago Essential hypertension   [facility] Danelle Berry, PA-C   5 months ago No-show for appointment   [facility] Danelle Berry, [place]       Future Appointments             In 2 months Danelle Berry, PA-C [facility]

## 2023-03-27 ENCOUNTER — Other Ambulatory Visit: Payer: Self-pay

## 2023-03-27 DIAGNOSIS — E1165 Type 2 diabetes mellitus with hyperglycemia: Secondary | ICD-10-CM

## 2023-03-27 DIAGNOSIS — E1142 Type 2 diabetes mellitus with diabetic polyneuropathy: Secondary | ICD-10-CM

## 2023-03-27 MED ORDER — OZEMPIC (2 MG/DOSE) 8 MG/3ML ~~LOC~~ SOPN
PEN_INJECTOR | SUBCUTANEOUS | 1 refills | Status: AC
Start: 2023-03-27 — End: ?

## 2023-03-28 ENCOUNTER — Encounter: Payer: Self-pay | Admitting: Ophthalmology

## 2023-03-28 NOTE — Anesthesia Preprocedure Evaluation (Addendum)
Anesthesia Evaluation  Patient identified by MRN, date of birth, ID band Patient awake    Reviewed: Allergy & Precautions, H&P , NPO status , Patient's Chart, lab work & pertinent test results  Airway Mallampati: III  TM Distance: >3 FB Neck ROM: Full    Dental no notable dental hx.    Pulmonary asthma , former smoker   Pulmonary exam normal breath sounds clear to auscultation       Cardiovascular hypertension, Normal cardiovascular exam Rhythm:Regular Rate:Normal     Neuro/Psych  Headaches Peripheral neuropathy  Neuromuscular disease  negative psych ROS   GI/Hepatic negative GI ROS, Neg liver ROS,,,  Endo/Other  diabetes    Renal/GU Renal disease  negative genitourinary   Musculoskeletal negative musculoskeletal ROS (+) Arthritis ,    Abdominal   Peds negative pediatric ROS (+)  Hematology  (+) Blood dyscrasia, anemia   Anesthesia Other Findings Medical History  Diabetes mellitus without complication Migraine Diabetic retinopathy of both eyes associated with type 2 diabetes mellitus Asthma Hypertension  Hyperlipidemia Chronic kidney disease     Reproductive/Obstetrics negative OB ROS                             Anesthesia Physical Anesthesia Plan  ASA: 3  Anesthesia Plan: MAC   Post-op Pain Management:    Induction: Intravenous  PONV Risk Score and Plan:   Airway Management Planned: Natural Airway and Nasal Cannula  Additional Equipment:   Intra-op Plan:   Post-operative Plan:   Informed Consent: I have reviewed the patients History and Physical, chart, labs and discussed the procedure including the risks, benefits and alternatives for the proposed anesthesia with the patient or authorized representative who has indicated his/her understanding and acceptance.     Dental Advisory Given  Plan Discussed with: Anesthesiologist, CRNA and Surgeon  Anesthesia Plan  Comments: (Patient consented for risks of anesthesia including but not limited to:  - adverse reactions to medications - damage to eyes, teeth, lips or other oral mucosa - nerve damage due to positioning  - sore throat or hoarseness - Damage to heart, brain, nerves, lungs, other parts of body or loss of life  Patient voiced understanding.)       Anesthesia Quick Evaluation

## 2023-03-31 ENCOUNTER — Other Ambulatory Visit: Payer: Self-pay | Admitting: Family Medicine

## 2023-03-31 DIAGNOSIS — I10 Essential (primary) hypertension: Secondary | ICD-10-CM

## 2023-04-01 NOTE — Discharge Instructions (Signed)

## 2023-04-03 ENCOUNTER — Ambulatory Visit
Admission: RE | Admit: 2023-04-03 | Discharge: 2023-04-03 | Disposition: A | Discharge status: Home / Self Care | Payer: 59 | Attending: Ophthalmology | Admitting: Ophthalmology

## 2023-04-03 ENCOUNTER — Ambulatory Visit: Payer: 59 | Admitting: Anesthesiology

## 2023-04-03 ENCOUNTER — Other Ambulatory Visit: Payer: Self-pay

## 2023-04-03 ENCOUNTER — Encounter
Admission: RE | Disposition: A | Discharge status: Home / Self Care | Payer: Self-pay | Source: Home / Self Care | Attending: Ophthalmology

## 2023-04-03 ENCOUNTER — Encounter: Payer: Self-pay | Admitting: Ophthalmology

## 2023-04-03 DIAGNOSIS — N189 Chronic kidney disease, unspecified: Secondary | ICD-10-CM | POA: Insufficient documentation

## 2023-04-03 DIAGNOSIS — Z87891 Personal history of nicotine dependence: Secondary | ICD-10-CM | POA: Insufficient documentation

## 2023-04-03 DIAGNOSIS — E1136 Type 2 diabetes mellitus with diabetic cataract: Secondary | ICD-10-CM | POA: Insufficient documentation

## 2023-04-03 DIAGNOSIS — E1122 Type 2 diabetes mellitus with diabetic chronic kidney disease: Secondary | ICD-10-CM | POA: Insufficient documentation

## 2023-04-03 DIAGNOSIS — Z794 Long term (current) use of insulin: Secondary | ICD-10-CM | POA: Diagnosis not present

## 2023-04-03 DIAGNOSIS — Z8249 Family history of ischemic heart disease and other diseases of the circulatory system: Secondary | ICD-10-CM | POA: Diagnosis not present

## 2023-04-03 DIAGNOSIS — Z833 Family history of diabetes mellitus: Secondary | ICD-10-CM | POA: Diagnosis not present

## 2023-04-03 DIAGNOSIS — H2512 Age-related nuclear cataract, left eye: Secondary | ICD-10-CM | POA: Diagnosis not present

## 2023-04-03 DIAGNOSIS — I129 Hypertensive chronic kidney disease with stage 1 through stage 4 chronic kidney disease, or unspecified chronic kidney disease: Secondary | ICD-10-CM | POA: Insufficient documentation

## 2023-04-03 HISTORY — PX: CATARACT EXTRACTION W/PHACO: SHX586

## 2023-04-03 LAB — GLUCOSE, CAPILLARY: Glucose-Capillary: 167 mg/dL — ABNORMAL HIGH (ref 70–99)

## 2023-04-03 SURGERY — PHACOEMULSIFICATION, CATARACT, WITH IOL INSERTION
Anesthesia: Monitor Anesthesia Care | Site: Eye | Laterality: Left

## 2023-04-03 MED ORDER — CEFUROXIME OPHTHALMIC INJECTION 1 MG/0.1 ML
INJECTION | OPHTHALMIC | Status: DC | PRN
  Administered 2023-04-03: .1 mL via INTRACAMERAL

## 2023-04-03 MED ORDER — ARMC OPHTHALMIC DILATING DROPS
1.0000 | OPHTHALMIC | Status: DC | PRN
  Administered 2023-04-03 (×3): 1 via OPHTHALMIC

## 2023-04-03 MED ORDER — MIDAZOLAM HCL 2 MG/2ML IJ SOLN
INTRAMUSCULAR | Status: DC | PRN
  Administered 2023-04-03: 2 mg via INTRAVENOUS

## 2023-04-03 MED ORDER — SIGHTPATH DOSE#1 BSS IO SOLN
INTRAOCULAR | Status: DC | PRN
  Administered 2023-04-03: 48 mL via OPHTHALMIC

## 2023-04-03 MED ORDER — SIGHTPATH DOSE#1 BSS IO SOLN
INTRAOCULAR | Status: DC | PRN
  Administered 2023-04-03: 1 mL

## 2023-04-03 MED ORDER — TETRACAINE HCL 0.5 % OP SOLN
1.0000 [drp] | OPHTHALMIC | Status: DC | PRN
  Administered 2023-04-03 (×3): 1 [drp] via OPHTHALMIC

## 2023-04-03 MED ORDER — LACTATED RINGERS IV SOLN: INTRAVENOUS | Status: DC

## 2023-04-03 MED ORDER — SIGHTPATH DOSE#1 NA HYALUR & NA CHOND-NA HYALUR IO KIT
PACK | INTRAOCULAR | Status: DC | PRN
  Administered 2023-04-03: 1 via OPHTHALMIC

## 2023-04-03 MED ORDER — SIGHTPATH DOSE#1 BSS IO SOLN
INTRAOCULAR | Status: DC | PRN
  Administered 2023-04-03: 15 mL

## 2023-04-03 MED ORDER — BRIMONIDINE TARTRATE-TIMOLOL 0.2-0.5 % OP SOLN
OPHTHALMIC | Status: DC | PRN
  Administered 2023-04-03: 1 [drp] via OPHTHALMIC

## 2023-04-03 MED ORDER — FENTANYL CITRATE (PF) 100 MCG/2ML IJ SOLN
INTRAMUSCULAR | Status: DC | PRN
  Administered 2023-04-03: 50 ug via INTRAVENOUS

## 2023-04-03 SURGICAL SUPPLY — 18 items
CANNULA ANT/CHMB 27G (MISCELLANEOUS) IMPLANT
CANNULA ANT/CHMB 27GA (MISCELLANEOUS) IMPLANT
CATARACT SUITE SIGHTPATH (MISCELLANEOUS) ×1 IMPLANT
FEE CATARACT SUITE SIGHTPATH (MISCELLANEOUS) ×1 IMPLANT
GLOVE SRG 8 PF TXTR STRL LF DI (GLOVE) ×1 IMPLANT
GLOVE SURG ENC TEXT LTX SZ7.5 (GLOVE) ×1 IMPLANT
GLOVE SURG GAMMEX PI TX LF 7.5 (GLOVE) IMPLANT
GLOVE SURG UNDER POLY LF SZ8 (GLOVE) ×1
LENS IOL TECNIS EYHANCE 24.0 (Intraocular Lens) IMPLANT
NDL FILTER BLUNT 18X1 1/2 (NEEDLE) ×1 IMPLANT
NDL RETROBULBAR .5 NSTRL (NEEDLE) IMPLANT
NEEDLE FILTER BLUNT 18X1 1/2 (NEEDLE) ×1 IMPLANT
PACK VIT ANT 23G (MISCELLANEOUS) IMPLANT
RING MALYGIN 7.0 (MISCELLANEOUS) IMPLANT
SUT ETHILON 10-0 CS-B-6CS-B-6 (SUTURE)
SUT VICRYL 9 0 (SUTURE) IMPLANT
SUTURE EHLN 10-0 CS-B-6CS-B-6 (SUTURE) IMPLANT
SYR 3ML LL SCALE MARK (SYRINGE) ×1 IMPLANT

## 2023-04-03 NOTE — Anesthesia Postprocedure Evaluation (Signed)
Anesthesia Post Note  Patient: Janice Jennings  Procedure(s) Performed: CATARACT EXTRACTION PHACO AND INTRAOCULAR LENS PLACEMENT (IOC) LEFT DIABETIC (Left: Eye)  Patient location during evaluation: PACU Anesthesia Type: MAC Level of consciousness: awake and alert Pain management: pain level controlled Vital Signs Assessment: post-procedure vital signs reviewed and stable Respiratory status: spontaneous breathing, nonlabored ventilation, respiratory function stable and patient connected to nasal cannula oxygen Cardiovascular status: stable and blood pressure returned to baseline Postop Assessment: no apparent nausea or vomiting Anesthetic complications: no   No notable events documented.   Last Vitals:  Vitals:   04/03/23 0835 04/03/23 0840  BP: 115/74 116/75  Pulse: 76 77  Resp: 13 12  Temp: 36.6 C 36.6 C  SpO2: 96% 97%    Last Pain:  Vitals:   04/03/23 0840  TempSrc:   PainSc: 0-No pain                 Adelena Desantiago C Era Parr

## 2023-04-03 NOTE — Op Note (Signed)
OPERATIVE NOTE  Janice Jennings [id number] 04/03/2023   PREOPERATIVE DIAGNOSIS:  Nuclear sclerotic cataract left eye. H25.12   POSTOPERATIVE DIAGNOSIS:    Nuclear sclerotic cataract left eye.     PROCEDURE:  Phacoemusification with posterior chamber intraocular lens placement of the left eye  Ultrasound time: Procedure(s) with comments: CATARACT EXTRACTION PHACO AND INTRAOCULAR LENS PLACEMENT (IOC) LEFT DIABETIC (Left) - 2.57 0:21.7  LENS:   Implant Name Type Inv. Item Serial No. Manufacturer Lot No. LRB No. Used Action  LENS IOL TECNIS EYHANCE 24.0 - [id number] Intraocular Lens LENS IOL TECNIS EYHANCE 24.0 [id number] [employer]  Left 1 Implanted      SURGEON:  Deirdre Evener, MD   ANESTHESIA:  Topical with tetracaine drops and 2% Xylocaine jelly, augmented with 1% preservative-free intracameral lidocaine.    COMPLICATIONS:  None.   DESCRIPTION OF PROCEDURE:  The patient was identified in the holding room and transported to the operating room and placed in the supine position under the operating microscope.  The left eye was identified as the operative eye and it was prepped and draped in the usual sterile ophthalmic fashion.   A 1 millimeter clear-corneal paracentesis was made at the 1:30 position.  0.5 ml of preservative-free 1% lidocaine was injected into the anterior chamber.  The anterior chamber was filled with Viscoat viscoelastic.  A 2.4 millimeter keratome was used to make a near-clear corneal incision at the 10:30 position.  .  A curvilinear capsulorrhexis was made with a cystotome and capsulorrhexis forceps.  Balanced salt solution was used to hydrodissect and hydrodelineate the nucleus.   Phacoemulsification was then used in stop and chop fashion to remove the lens nucleus and epinucleus.  The remaining cortex was then removed using the irrigation and aspiration handpiece. Provisc was then placed into the capsular bag to distend it for lens placement.  A lens was then  injected into the capsular bag.  The remaining viscoelastic was aspirated.   Wounds were hydrated with balanced salt solution.  The anterior chamber was inflated to a physiologic pressure with balanced salt solution.  No wound leaks were noted. Cefuroxime 0.1 ml of a 10mg /ml solution was injected into the anterior chamber for a dose of 1 mg of intracameral antibiotic at the completion of the case.   Timolol and Brimonidine drops were applied to the eye.  The patient was taken to the recovery room in stable condition without complications of anesthesia or surgery.  Reed Dady 04/03/2023, 8:34 AM

## 2023-04-03 NOTE — Transfer of Care (Signed)
Immediate Anesthesia Transfer of Care Note  Patient: Janice Jennings  Procedure(s) Performed: CATARACT EXTRACTION PHACO AND INTRAOCULAR LENS PLACEMENT (IOC) LEFT DIABETIC (Left: Eye)  Patient Location: PACU  Anesthesia Type: MAC  Level of Consciousness: awake, alert  and patient cooperative  Airway and Oxygen Therapy: Patient Spontanous Breathing and Patient connected to supplemental oxygen  Post-op Assessment: Post-op Vital signs reviewed, Patient's Cardiovascular Status Stable, Respiratory Function Stable, Patent Airway and No signs of Nausea or vomiting  Post-op Vital Signs: Reviewed and stable  Complications: No notable events documented.

## 2023-04-03 NOTE — H&P (Signed)
[facility]   Primary Care Physician:  Danelle Berry, PA-C Ophthalmologist: Dr. Lockie Mola  Pre-Procedure History & Physical: HPI:  Janice Jennings is a 52 y.o. female here for ophthalmic surgery.   Past Medical History:  Diagnosis Date   Asthma    Chronic kidney disease    stage 4   Diabetes mellitus without complication (HCC)    Diabetic retinopathy of both eyes associated with type 2 diabetes mellitus (HCC) 04/11/2018   Moderate to severe, non-proliferative DR; referred to retinal specialist; April 10, 2018   Hyperlipidemia    Hypertension    Migraine    Excedrin Tension HA OTC    Past Surgical History:  Procedure Laterality Date   BREAST REDUCTION SURGERY  2001   CESAREAN SECTION     COLONOSCOPY WITH PROPOFOL N/A 08/15/2018   Procedure: COLONOSCOPY WITH PROPOFOL;  Surgeon: Wyline Mood, MD;  Location: [facility] ENDOSCOPY;  Service: Gastroenterology;  Laterality: N/A;   ESOPHAGOGASTRODUODENOSCOPY (EGD) WITH PROPOFOL N/A 08/15/2018   Procedure: ESOPHAGOGASTRODUODENOSCOPY (EGD) WITH PROPOFOL;  Surgeon: Wyline Mood, MD;  Location: [facility] ENDOSCOPY;  Service: Gastroenterology;  Laterality: N/A;   GIVENS CAPSULE STUDY N/A 09/08/2018   Procedure: GIVENS CAPSULE STUDY;  Surgeon: Wyline Mood, MD;  Location: [facility] ENDOSCOPY;  Service: Gastroenterology;  Laterality: N/A;   REDUCTION MAMMAPLASTY Bilateral 2000   SHOULDER SURGERY Left    TUBAL LIGATION      Prior to Admission medications   Medication Sig Start Date End Date Taking? Authorizing Provider  acetaminophen-codeine (TYLENOL #2) 300-15 MG tablet Take 1 tablet by mouth every 4 (four) hours as needed for moderate pain.   Yes [provider]  amLODipine (NORVASC) 10 MG tablet TAKE 1 TABLET BY MOUTH DAILY 04/01/23  Yes Danelle Berry, PA-C  atorvastatin (LIPITOR) 80 MG tablet Take 1 tablet (80 mg total) by mouth at bedtime. 10/25/22  Yes Sowles, Danna Hefty, MD  Blood Glucose Monitoring Suppl DEVI 1 each by Does not  apply route in the morning, at noon, and at bedtime. E11.65, Z79.4 Check BG TID May substitute to any manufacturer covered by patient's insurance. 03/26/23  Yes Danelle Berry, PA-C  carvedilol (COREG) 12.5 MG tablet TAKE 1 TABLET BY MOUTH TWICE  DAILY WITH MEALS 03/15/23  Yes Danelle Berry, PA-C  cloNIDine (CATAPRES - DOSED IN MG/24 HR) 0.1 mg/24hr patch APPLY 1 PATCH TOPICALLY TO SKIN  ONCE WEEKLY 03/06/23  Yes Danelle Berry, PA-C  Continuous Glucose Sensor (FREESTYLE LIBRE 3 SENSOR) MISC 1 Device by Does not apply route every 14 (fourteen) days. 03/01/23  Yes Danelle Berry, PA-C  cyclobenzaprine (FLEXERIL) 5 MG tablet TAKE 1 TABLET BY MOUTH THREE TIMES A DAY AS NEEDED FOR MUSCLE SPASMS 11/13/21  Yes Berniece Salines, FNP  ezetimibe (ZETIA) 10 MG tablet TAKE 1 TABLET BY MOUTH  DAILY 08/13/22  Yes Danelle Berry, PA-C  ferrous sulfate 325 (65 FE) MG tablet Take 325 mg by mouth daily with breakfast.   Yes [provider]  Gabapentin (NEURAPTINE) 10 % CREA Apply 1 Application topically daily as needed (pain to affected area). 03/01/23  Yes Danelle Berry, PA-C  gabapentin (NEURONTIN) 300 MG capsule Take 1 capsule (300 mg total) by mouth 2 (two) times daily. 03/01/23  Yes Danelle Berry, PA-C  Glucose Blood (BLOOD GLUCOSE TEST STRIPS) STRP 1 each by In Vitro route in the morning, at noon, and at bedtime. E11.65, Z79.4 Check BG TID May substitute to any manufacturer covered by patient's insurance. 03/26/23  Yes Danelle Berry, PA-C  hydrALAZINE (APRESOLINE) 100  MG tablet TAKE 1 TABLET BY MOUTH 3 TIMES  DAILY 01/01/23  Yes Danelle Berry, PA-C  Insulin Pen Needle 32G X 4 MM MISC Use as directed with insulin daily 01/29/22  Yes Tapia, Leisa, PA-C  JARDIANCE 10 MG TABS tablet Take 10 mg by mouth daily. 04/02/22  Yes [provider]  Lancets Misc. MISC 1 each by Does not apply route in the morning, at noon, and at bedtime. E11.65, Z79.4 Check BG TID May substitute to any manufacturer covered by patient's insurance. 03/26/23   Yes Danelle Berry, PA-C  LANTUS SOLOSTAR 100 UNIT/ML Solostar Pen INJECT SUBCUTANEOUSLY 60 TO 80 UNITS DAILY Patient taking differently: 52 Units at bedtime. INJECT SUBCUTANEOUSLY 60 TO 80 UNITS DAILY 09/24/22  Yes Danelle Berry, PA-C  lidocaine (LIDODERM) 5 % Place 1 patch onto the skin daily. Remove & Discard patch  within 24 h or as directed by MD 03/01/23  Yes Danelle Berry, PA-C  losartan (COZAAR) 100 MG tablet Take 100 mg by mouth daily. 04/25/21  Yes [provider]  Multiple Vitamin (MULTIVITAMIN) tablet Take 1 tablet by mouth daily.   Yes [provider]  nystatin cream (MYCOSTATIN) Apply 1 Application topically 2 (two) times daily. 07/30/22  Yes Berniece Salines, FNP  PENNSAID 2 % SOLN SMARTSIG:1 Pump Topical Twice Daily PRN 03/23/21  Yes [provider]  Semaglutide, 2 MG/DOSE, (OZEMPIC, 2 MG/DOSE,) 8 MG/3ML SOPN INJECT 2 MG INTO THE SKIN ONCE A WEEK. 03/27/23  Yes Danelle Berry, PA-C  venlafaxine XR (EFFEXOR-XR) 75 MG 24 hr capsule TAKE 1 CAPSULE BY MOUTH DAILY  WITH BREAKFAST 03/26/23  Yes Danelle Berry, PA-C  albuterol (VENTOLIN HFA) 108 (90 Base) MCG/ACT inhaler Inhale 2 puffs into the lungs every 4 (four) hours as needed for wheezing or shortness of breath. Patient not taking: Reported on 03/28/2023 10/27/20   Danelle Berry, PA-C    Allergies as of 03/15/2023 - Review Complete 03/01/2023  Allergen Reaction Noted   Hydrocodone Itching 08/01/2021    Family History  Problem Relation Age of Onset   Diabetes Father    Hyperlipidemia Father    Hypertension Father    Cancer Maternal Aunt 45       leukemia - died   Diabetes Maternal Grandmother    Cancer Maternal Grandmother        breast cancer   Stroke Maternal Grandmother    Breast cancer Maternal Grandmother    Diabetes Daughter        Pre-diabetes   Diabetes Maternal Grandfather    Hyperlipidemia Maternal Grandfather    Hypertension Maternal Grandfather    Cancer Maternal Aunt 50       breast cancer -  died   Ovarian cancer Neg Hx    Colon cancer Neg Hx     Social History   Socioeconomic History   Marital status: Single    Spouse name: Not on file   Number of children: 2   Years of education: 18   Highest education level: Master's degree (e.g., MA, MS, MEng, MEd, MSW, MBA)  Occupational History   Occupation: Veterinary surgeon at [employer]    Employer: [employer]  Tobacco Use   Smoking status: Former    Types: Cigarettes    Quit date: 2001    Years since quitting: 23.4   Smokeless tobacco: Never   Tobacco comments:    Smoked for 1 year.  2001  Vaping Use   Vaping Use: Never used  Substance and Sexual Activity  Alcohol use: Yes    Alcohol/week: 0.0 standard drinks of alcohol    Comment: occ   Drug use: No   Sexual activity: Not on file  Other Topics Concern   Not on file  Social History Narrative   Janice Jennings grew up in [place], [place] and also in [place]. She attending the [employer] [place] and obtained her Bachelors in Psychology. She then attended [employer] and obtained her Masters in Psychology. She works as a Veterinary surgeon at [employer]. She is divorced. She lives at home with her two children, Darius age 48 and Mi'Niya age 71. They have a dog named Coco who she describes as having separation anxiety. Janice Jennings enjoys activities with the children. They enjoy outdoor activities, watching movies.     Social Determinants of Health   Financial Resource Strain: Low Risk  (03/01/2023)   Overall Financial Resource Strain (CARDIA)    Difficulty of Paying Living Expenses: Not very hard  Food Insecurity: No Food Insecurity (03/01/2023)   Hunger Vital Sign    Worried About Running Out of Food in the Last Year: Never true    Ran Out of Food in the Last Year: Never true  Transportation Needs: No Transportation Needs (03/01/2023)   PRAPARE - Administrator, Civil Service (Medical): No    Lack of Transportation (Non-Medical): No  Physical  Activity: Insufficiently Active (03/01/2023)   Exercise Vital Sign    Days of Exercise per Week: 3 days    Minutes of Exercise per Session: 10 min  Stress: No Stress Concern Present (03/01/2023)   [employer] of Occupational Health - Occupational Stress Questionnaire    Feeling of Stress : Not at all  Social Connections: Moderately Isolated (03/01/2023)   Social Connection and Isolation Panel [NHANES]    Frequency of Communication with Friends and Family: More than three times a week    Frequency of Social Gatherings with Friends and Family: Once a week    Attends Religious Services: More than 4 times per year    Active Member of [employer] or Organizations: No    Attends Engineer, structural: Not on file    Marital Status: Divorced  Intimate Partner Violence: Not At Risk (08/23/2021)   Humiliation, Afraid, Rape, and Kick questionnaire    Fear of Current or Ex-Partner: No    Emotionally Abused: No    Physically Abused: No    Sexually Abused: No    Review of Systems: See HPI, otherwise negative ROS  Physical Exam: BP (!) 146/85   Temp 98.1 F (36.7 C) (Temporal)   Resp 15   Ht 5\' 4"  (1.626 m)   Wt 91.6 kg   LMP 05/29/2018   SpO2 98%   BMI 34.67 kg/m  General:   Alert,  pleasant and cooperative in NAD Head:  Normocephalic and atraumatic. Lungs:  Clear to auscultation.    Heart:  Regular rate and rhythm.   Impression/Plan: Annamaria Vittone is here for ophthalmic surgery.  Risks, benefits, limitations, and alternatives regarding ophthalmic surgery have been reviewed with the patient.  Questions have been answered.  All parties agreeable.   Lockie Mola, MD  04/03/2023, 7:28 AM

## 2023-04-04 ENCOUNTER — Encounter: Payer: Self-pay | Admitting: Ophthalmology

## 2023-04-04 ENCOUNTER — Encounter: Payer: Self-pay | Admitting: Family Medicine

## 2023-04-11 ENCOUNTER — Other Ambulatory Visit: Payer: Self-pay | Admitting: Family Medicine

## 2023-04-11 DIAGNOSIS — E1165 Type 2 diabetes mellitus with hyperglycemia: Secondary | ICD-10-CM

## 2023-04-13 ENCOUNTER — Other Ambulatory Visit: Payer: Self-pay | Admitting: Family Medicine

## 2023-04-13 DIAGNOSIS — I10 Essential (primary) hypertension: Secondary | ICD-10-CM

## 2023-04-23 ENCOUNTER — Ambulatory Visit
Admission: RE | Admit: 2023-04-23 | Discharge: 2023-04-23 | Disposition: A | Discharge status: Home / Self Care | Payer: 59 | Source: Ambulatory Visit | Attending: Family Medicine | Admitting: Family Medicine

## 2023-04-23 DIAGNOSIS — Z1231 Encounter for screening mammogram for malignant neoplasm of breast: Secondary | ICD-10-CM | POA: Insufficient documentation

## 2023-05-04 ENCOUNTER — Other Ambulatory Visit: Payer: Self-pay | Admitting: Family Medicine

## 2023-05-04 DIAGNOSIS — E1165 Type 2 diabetes mellitus with hyperglycemia: Secondary | ICD-10-CM

## 2023-05-24 ENCOUNTER — Other Ambulatory Visit: Payer: Self-pay | Admitting: Family Medicine

## 2023-05-24 DIAGNOSIS — N951 Menopausal and female climacteric states: Secondary | ICD-10-CM

## 2023-06-03 ENCOUNTER — Ambulatory Visit (INDEPENDENT_AMBULATORY_CARE_PROVIDER_SITE_OTHER): Payer: 59 | Admitting: Family Medicine

## 2023-06-03 ENCOUNTER — Encounter: Payer: Self-pay | Admitting: Family Medicine

## 2023-06-03 VITALS — BP 138/76 | HR 85 | Temp 97.4°F | Resp 16 | Ht 64.0 in | Wt 202.0 lb

## 2023-06-03 DIAGNOSIS — Z794 Long term (current) use of insulin: Secondary | ICD-10-CM

## 2023-06-03 DIAGNOSIS — E1165 Type 2 diabetes mellitus with hyperglycemia: Secondary | ICD-10-CM | POA: Diagnosis not present

## 2023-06-03 DIAGNOSIS — N184 Chronic kidney disease, stage 4 (severe): Secondary | ICD-10-CM

## 2023-06-03 DIAGNOSIS — F439 Reaction to severe stress, unspecified: Secondary | ICD-10-CM

## 2023-06-03 DIAGNOSIS — I10 Essential (primary) hypertension: Secondary | ICD-10-CM | POA: Diagnosis not present

## 2023-06-03 MED ORDER — BUSPIRONE HCL 5 MG PO TABS
5.0000 mg | ORAL_TABLET | Freq: Two times a day (BID) | ORAL | 1 refills | Status: DC | PRN
Start: 2023-06-03 — End: 2023-07-02

## 2023-06-03 NOTE — Progress Notes (Signed)
Name: Janice Jennings   MRN: [id number]    DOB: 1971/07/19   Date:06/03/2023       Progress Note  Chief Complaint  Patient presents with   Follow-up   Diabetes     Subjective:   Janice Jennings is a 52 y.o. female, presents to clinic for routine f/up  She did recently decrease her insulin dose due to hypoglycemia with A1c of 6.6 She has not had any hypoglycemic episodes and is using the continuous glucose monitor     Blood pressure is slightly elevated today, she is very stressed going back to work tomorrow after being off for 2 months I does cause anxiety and stress She is manage on hydralazine 100 mg 3 times daily Norvasc 10 mg Coreg 12.5 mg twice a day and losartan 100 mg also seeing nephrology BP Readings from Last 3 Encounters:  06/03/23 (!) 152/80  04/03/23 116/75  03/01/23 122/70   DM:   Pt managing DM with insulin 50 units no lows, ozempic 2mg  Reports good med compliance Pt has no SE from meds. Blood sugars are at goal Denies: Polyuria, polydipsia, vision changes, neuropathy, hypoglycemia Recent pertinent labs: Lab Results  Component Value Date   HGBA1C 6.6 (H) 03/01/2023   HGBA1C 7.7 (H) 11/28/2022   HGBA1C 6.6 08/20/2022   Lab Results  Component Value Date   MICROALBUR 33.6 03/01/2023   LDLCALC 60 11/28/2022   CREATININE 2.50 (H) 03/01/2023   Standard of care and health maintenance:  HLD labs done this year, no med changes Lab Results  Component Value Date   CHOL 126 11/28/2022   HDL 51 11/28/2022   LDLCALC 60 11/28/2022   TRIG 70 11/28/2022   CHOLHDL 2.5 11/28/2022   BP high today  Stressed- would be willing to try something for intermittent stress and feeling anxious   Current Outpatient Medications:    acetaminophen-codeine (TYLENOL #2) 300-15 MG tablet, Take 1 tablet by mouth every 4 (four) hours as needed for moderate pain., Disp: , Rfl:    amLODipine (NORVASC) 10 MG tablet, TAKE 1 TABLET BY MOUTH DAILY, Disp: 90 tablet, Rfl: 3    amoxicillin (AMOXIL) 500 MG tablet, TAKE 1 TABLET BY MOUTH EVERY 8 HOURS UNTIL FINISHED, Disp: , Rfl:    atorvastatin (LIPITOR) 80 MG tablet, Take 1 tablet (80 mg total) by mouth at bedtime., Disp: 90 tablet, Rfl: 3   Blood Glucose Monitoring Suppl DEVI, 1 each by Does not apply route in the morning, at noon, and at bedtime. E11.65, Z79.4 Check BG TID May substitute to any manufacturer covered by patient's insurance., Disp: 1 each, Rfl: 0   carvedilol (COREG) 12.5 MG tablet, TAKE 1 TABLET BY MOUTH TWICE  DAILY WITH MEALS, Disp: 180 tablet, Rfl: 3   chlorhexidine (PERIDEX) 0.12 % solution, RINSE MOUTH WITH (1 CAPFUL) FOR 30 SECONDS IN MORNING AND EVENING AFTER BRUSHING, THEN SPIT, Disp: , Rfl:    cloNIDine (CATAPRES - DOSED IN MG/24 HR) 0.1 mg/24hr patch, APPLY 1 PATCH TOPICALLY ONCE A  WEEK, Disp: 4 patch, Rfl: 11   Continuous Glucose Sensor (FREESTYLE LIBRE 3 SENSOR) MISC, APPLY 1 SENSOR EVERY 14 DAYS, Disp: 9 each, Rfl: 3   cyclobenzaprine (FLEXERIL) 5 MG tablet, TAKE 1 TABLET BY MOUTH THREE TIMES A DAY AS NEEDED FOR MUSCLE SPASMS, Disp: 30 tablet, Rfl: 1   ezetimibe (ZETIA) 10 MG tablet, TAKE 1 TABLET BY MOUTH  DAILY, Disp: 90 tablet, Rfl: 3   ferrous sulfate 325 (65 FE) MG tablet, Take  325 mg by mouth daily with breakfast., Disp: , Rfl:    Gabapentin (NEURAPTINE) 10 % CREA, Apply 1 Application topically daily as needed (pain to affected area)., Disp: 1 g, Rfl: 5   gabapentin (NEURONTIN) 300 MG capsule, Take 1 capsule (300 mg total) by mouth 2 (two) times daily., Disp: 180 capsule, Rfl: 3   Glucose Blood (BLOOD GLUCOSE TEST STRIPS) STRP, 1 each by In Vitro route in the morning, at noon, and at bedtime. E11.65, Z79.4 Check BG TID May substitute to any manufacturer covered by patient's insurance., Disp: 100 strip, Rfl: 3   hydrALAZINE (APRESOLINE) 100 MG tablet, TAKE 1 TABLET BY MOUTH 3 TIMES  DAILY, Disp: 270 tablet, Rfl: 3   Insulin Pen Needle (BD PEN NEEDLE NANO U/F) 32G X 4 MM MISC, USE AS  DIRECTED WITH INSULIN  DAILY, Disp: 90 each, Rfl: 2   JARDIANCE 10 MG TABS tablet, Take 10 mg by mouth daily., Disp: , Rfl:    Lancets Misc. MISC, 1 each by Does not apply route in the morning, at noon, and at bedtime. E11.65, Z79.4 Check BG TID May substitute to any manufacturer covered by patient's insurance., Disp: 100 each, Rfl: 5   LANTUS SOLOSTAR 100 UNIT/ML Solostar Pen, INJECT SUBCUTANEOUSLY 60 TO 80 UNITS DAILY (Patient taking differently: 52 Units at bedtime. INJECT SUBCUTANEOUSLY 60 TO 80 UNITS DAILY), Disp: 75 mL, Rfl: 3   lidocaine (LIDODERM) 5 %, Place 1 patch onto the skin daily. Remove & Discard patch  within 24 h or as directed by MD, Disp: 30 patch, Rfl: 0   losartan (COZAAR) 100 MG tablet, Take 100 mg by mouth daily., Disp: , Rfl:    Multiple Vitamin (MULTIVITAMIN) tablet, Take 1 tablet by mouth daily., Disp: , Rfl:    nystatin cream (MYCOSTATIN), Apply 1 Application topically 2 (two) times daily., Disp: 30 g, Rfl: 0   PENNSAID 2 % SOLN, SMARTSIG:1 Pump Topical Twice Daily PRN, Disp: , Rfl:    Semaglutide, 2 MG/DOSE, (OZEMPIC, 2 MG/DOSE,) 8 MG/3ML SOPN, INJECT 2 MG INTO THE SKIN ONCE A WEEK., Disp: 9 mL, Rfl: 1   venlafaxine XR (EFFEXOR-XR) 75 MG 24 hr capsule, TAKE 1 CAPSULE BY MOUTH DAILY  WITH BREAKFAST, Disp: 90 capsule, Rfl: 1   albuterol (VENTOLIN HFA) 108 (90 Base) MCG/ACT inhaler, Inhale 2 puffs into the lungs every 4 (four) hours as needed for wheezing or shortness of breath. (Patient not taking: Reported on 03/28/2023), Disp: 1 each, Rfl: 1  Patient Active Problem List   Diagnosis Date Noted   Elevated sedimentation rate 08/01/2021   Rheumatoid factor positive 08/01/2021   Bilateral wrist pain 08/01/2021   Chronic kidney disease, stage IV (severe) (HCC) 04/25/2021   Secondary hyperparathyroidism of renal origin (HCC) 10/27/2020   Mild intermittent asthma without complication 10/27/2020   Hot flashes due to menopause 07/08/2020   Osteoarthritis of knee 07/08/2020    Pain in joint of left shoulder 07/08/2020   Lumbar radiculopathy 11/10/2019   Type 2 diabetes mellitus with hyperglycemia, with long-term current use of insulin (HCC) 09/22/2019   Anemia in chronic kidney disease 08/31/2019   Benign hypertensive kidney disease with chronic kidney disease 08/31/2019   Proteinuria 08/31/2019   Type 2 diabetes mellitus with diabetic chronic kidney disease (HCC) 08/31/2019   Hyperlipidemia associated with type 2 diabetes mellitus (HCC) 08/10/2019   Hemoptysis 11/15/2018   Class 2 severe obesity with serious comorbidity and body mass index (BMI) of 35.0 to 35.9 in adult [facility]) 09/22/2018  Diabetic retinopathy of both eyes associated with type 2 diabetes mellitus (HCC) 04/11/2018   Essential hypertension 03/07/2015   DM type 2 with diabetic peripheral neuropathy (HCC) 05/26/2014    Past Surgical History:  Procedure Laterality Date   BREAST REDUCTION SURGERY  2001   CATARACT EXTRACTION W/PHACO Left 04/03/2023   Procedure: CATARACT EXTRACTION PHACO AND INTRAOCULAR LENS PLACEMENT (IOC) LEFT DIABETIC;  Surgeon: Lockie Mola, MD;  Location: [facility];  Service: Ophthalmology;  Laterality: Left;  2.57 0:21.7   CESAREAN SECTION     COLONOSCOPY WITH PROPOFOL N/A 08/15/2018   Procedure: COLONOSCOPY WITH PROPOFOL;  Surgeon: Wyline Mood, MD;  Location: [facility] ENDOSCOPY;  Service: Gastroenterology;  Laterality: N/A;   ESOPHAGOGASTRODUODENOSCOPY (EGD) WITH PROPOFOL N/A 08/15/2018   Procedure: ESOPHAGOGASTRODUODENOSCOPY (EGD) WITH PROPOFOL;  Surgeon: Wyline Mood, MD;  Location: [facility] ENDOSCOPY;  Service: Gastroenterology;  Laterality: N/A;   GIVENS CAPSULE STUDY N/A 09/08/2018   Procedure: GIVENS CAPSULE STUDY;  Surgeon: Wyline Mood, MD;  Location: [facility] ENDOSCOPY;  Service: Gastroenterology;  Laterality: N/A;   REDUCTION MAMMAPLASTY Bilateral 2000   SHOULDER SURGERY Left    TUBAL LIGATION      Family History  Problem Relation Age of Onset   Diabetes  Father    Hyperlipidemia Father    Hypertension Father    Cancer Maternal Aunt 45       leukemia - died   Diabetes Maternal Grandmother    Cancer Maternal Grandmother        breast cancer   Stroke Maternal Grandmother    Breast cancer Maternal Grandmother    Diabetes Daughter        Pre-diabetes   Diabetes Maternal Grandfather    Hyperlipidemia Maternal Grandfather    Hypertension Maternal Grandfather    Cancer Maternal Aunt 50       breast cancer - died   Ovarian cancer Neg Hx    Colon cancer Neg Hx     Social History   Tobacco Use   Smoking status: Former    Current packs/day: 0.00    Types: Cigarettes    Quit date: 2001    Years since quitting: 23.6   Smokeless tobacco: Never   Tobacco comments:    Smoked for 1 year.  2001  Vaping Use   Vaping status: Never Used  Substance Use Topics   Alcohol use: Yes    Alcohol/week: 0.0 standard drinks of alcohol    Comment: occ   Drug use: No     Allergies  Allergen Reactions   Hydrocodone Itching    Health Maintenance  Topic Date Due   INFLUENZA VACCINE  05/30/2023   COVID-19 Vaccine (9 - 2023-24 season) 06/19/2023 (Originally 01/05/2023)   HEMOGLOBIN A1C  09/01/2023   Diabetic kidney evaluation - eGFR measurement  02/29/2024   Diabetic kidney evaluation - Urine ACR  02/29/2024   FOOT EXAM  02/29/2024   OPHTHALMOLOGY EXAM  03/10/2024   MAMMOGRAM  04/22/2024   DTaP/Tdap/Td (2 - Tdap) 05/26/2024   PAP SMEAR-Modifier  08/23/2024   Colonoscopy  08/15/2028   Hepatitis C Screening  Completed   HIV Screening  Completed   Zoster Vaccines- Shingrix  Completed   HPV VACCINES  Aged Out    Chart Review Today: I personally reviewed active problem list, medication list, allergies, family history, social history, health maintenance, notes from last encounter, lab results, imaging with the patient/caregiver today.   Review of Systems  Constitutional: Negative.   HENT: Negative.    Eyes: Negative.  Respiratory: Negative.     Cardiovascular: Negative.   Gastrointestinal: Negative.   Endocrine: Negative.   Genitourinary: Negative.   Musculoskeletal: Negative.   Skin: Negative.   Allergic/Immunologic: Negative.   Neurological: Negative.   Hematological: Negative.   Psychiatric/Behavioral: Negative.    All other systems reviewed and are negative.    Objective:   Vitals:   06/03/23 1553 06/03/23 1635  BP: (!) 152/80 138/76  Pulse: 85   Resp: 16   Temp: (!) 97.4 F (36.3 C)   TempSrc: Oral   SpO2: 99%   Weight: 202 lb (91.6 kg)   Height: 5\' 4"  (1.626 m)     Body mass index is 34.67 kg/m.  Physical Exam Vitals and nursing note reviewed.  Constitutional:      General: She is not in acute distress.    Appearance: Normal appearance. She is well-developed. She is obese. She is not ill-appearing, toxic-appearing or diaphoretic.  HENT:     Head: Normocephalic and atraumatic.     Nose: Nose normal.  Eyes:     General:        Right eye: No discharge.        Left eye: No discharge.     Conjunctiva/sclera: Conjunctivae normal.  Neck:     Trachea: No tracheal deviation.  Cardiovascular:     Rate and Rhythm: Normal rate and regular rhythm.  Pulmonary:     Effort: Pulmonary effort is normal. No respiratory distress.     Breath sounds: No stridor.  Skin:    General: Skin is warm and dry.     Findings: No rash.  Neurological:     Mental Status: She is alert.     Motor: No abnormal muscle tone.     Coordination: Coordination normal.  Psychiatric:        Mood and Affect: Mood is anxious.        Behavior: Behavior normal.         Assessment & Plan:   1. Type 2 diabetes mellitus with hyperglycemia, with long-term current use of insulin (HCC) Was here to f/up on IDDM, well controlled, unfortunately VO were put in and CMA did not hear that we needed A1C and CMP  Per CGM sugars remain at goal No hypoglycemia We will recheck at next OV, we cannot add on  - COMPLETE METABOLIC PANEL WITH  GFR  2. Situational stress Returning to work, it has been a constant stressor, has been off work for about 2 months due to eye surgery and is due to return tomorrow is having difficult time sleeping and unwinding feeling very stressed and anxious Discussed PRN med options and reviewed renal dosing with BuSpar she is willing to try explained that she may have some side effects but it should not be sedating.  She can try hydroxyzine if she would like to it bedtime it may help with insomnia at this time she declines hydroxyzine Offered other support including referrals to therapy and supportive listening today - busPIRone (BUSPAR) 5 MG tablet; Take 1 tablet (5 mg total) by mouth 2 (two) times daily as needed.  Dispense: 60 tablet; Refill: 1  3. Essential hypertension Blood pressure was a little elevated at the start of her appointment she endorses significant stress it slightly improved with recheck prior to leaving  no med changes Continue hydralazine, amlodipine, losartan, carvedilol BP Readings from Last 3 Encounters:  06/03/23 138/76  04/03/23 116/75  03/01/23 122/70    4. Chronic kidney disease, stage IV (  severe) [facility]) Per nephrology        Return in about 3 months (around 09/03/2023) for a1c/DM/HTN.   Danelle Berry, PA-C 06/03/23 4:15 PM

## 2023-06-17 ENCOUNTER — Encounter: Payer: Self-pay | Admitting: Family Medicine

## 2023-06-18 ENCOUNTER — Other Ambulatory Visit: Payer: Self-pay

## 2023-06-18 DIAGNOSIS — M5416 Radiculopathy, lumbar region: Secondary | ICD-10-CM

## 2023-06-18 MED ORDER — CYCLOBENZAPRINE HCL 5 MG PO TABS
5.0000 mg | ORAL_TABLET | Freq: Three times a day (TID) | ORAL | 1 refills | Status: DC | PRN
Start: 2023-06-18 — End: 2023-12-27

## 2023-07-01 ENCOUNTER — Other Ambulatory Visit: Payer: Self-pay | Admitting: Family Medicine

## 2023-07-01 DIAGNOSIS — F439 Reaction to severe stress, unspecified: Secondary | ICD-10-CM

## 2023-07-07 ENCOUNTER — Other Ambulatory Visit: Payer: Self-pay | Admitting: Family Medicine

## 2023-07-07 DIAGNOSIS — E1169 Type 2 diabetes mellitus with other specified complication: Secondary | ICD-10-CM

## 2023-07-11 ENCOUNTER — Other Ambulatory Visit: Payer: Self-pay | Admitting: Family Medicine

## 2023-07-11 DIAGNOSIS — E1169 Type 2 diabetes mellitus with other specified complication: Secondary | ICD-10-CM

## 2023-07-15 ENCOUNTER — Other Ambulatory Visit: Payer: Self-pay | Admitting: Family Medicine

## 2023-07-15 DIAGNOSIS — N951 Menopausal and female climacteric states: Secondary | ICD-10-CM

## 2023-07-16 ENCOUNTER — Encounter: Payer: Self-pay | Admitting: Family Medicine

## 2023-07-30 ENCOUNTER — Encounter: Payer: Self-pay | Admitting: Family Medicine

## 2023-07-31 ENCOUNTER — Other Ambulatory Visit: Payer: Self-pay

## 2023-07-31 DIAGNOSIS — E1165 Type 2 diabetes mellitus with hyperglycemia: Secondary | ICD-10-CM

## 2023-07-31 DIAGNOSIS — E1142 Type 2 diabetes mellitus with diabetic polyneuropathy: Secondary | ICD-10-CM

## 2023-07-31 DIAGNOSIS — I10 Essential (primary) hypertension: Secondary | ICD-10-CM

## 2023-07-31 DIAGNOSIS — E66812 Obesity, class 2: Secondary | ICD-10-CM

## 2023-07-31 DIAGNOSIS — E1169 Type 2 diabetes mellitus with other specified complication: Secondary | ICD-10-CM

## 2023-07-31 DIAGNOSIS — N951 Menopausal and female climacteric states: Secondary | ICD-10-CM

## 2023-07-31 MED ORDER — CARVEDILOL 12.5 MG PO TABS: 12.5000 mg | ORAL_TABLET | Freq: Two times a day (BID) | ORAL | 1 refills | Status: DC

## 2023-07-31 MED ORDER — AMLODIPINE BESYLATE 10 MG PO TABS
10.0000 mg | ORAL_TABLET | Freq: Every day | ORAL | 1 refills | Status: DC
Start: 2023-07-31 — End: 2024-01-31

## 2023-07-31 MED ORDER — HYDRALAZINE HCL 100 MG PO TABS
100.0000 mg | ORAL_TABLET | Freq: Three times a day (TID) | ORAL | 1 refills | Status: DC
Start: 2023-07-31 — End: 2024-02-03

## 2023-07-31 MED ORDER — ATORVASTATIN CALCIUM 80 MG PO TABS
80.0000 mg | ORAL_TABLET | Freq: Every day | ORAL | 1 refills | Status: DC
Start: 2023-07-31 — End: 2024-01-31

## 2023-07-31 MED ORDER — OZEMPIC (2 MG/DOSE) 8 MG/3ML ~~LOC~~ SOPN
PEN_INJECTOR | SUBCUTANEOUS | 0 refills | Status: DC
Start: 2023-07-31 — End: 2023-11-14

## 2023-07-31 MED ORDER — CLONIDINE 0.1 MG/24HR TD PTWK: 0.1000 mg | MEDICATED_PATCH | TRANSDERMAL | 8 refills | Status: DC

## 2023-07-31 MED ORDER — GABAPENTIN 300 MG PO CAPS
300.0000 mg | ORAL_CAPSULE | Freq: Two times a day (BID) | ORAL | 2 refills | Status: DC
Start: 2023-07-31 — End: 2024-01-17

## 2023-07-31 MED ORDER — VENLAFAXINE HCL ER 75 MG PO CP24
75.0000 mg | ORAL_CAPSULE | Freq: Every day | ORAL | 0 refills | Status: DC
Start: 2023-07-31 — End: 2023-10-28

## 2023-07-31 MED ORDER — EZETIMIBE 10 MG PO TABS
10.0000 mg | ORAL_TABLET | Freq: Every day | ORAL | 3 refills | Status: DC
Start: 2023-07-31 — End: 2024-07-29

## 2023-07-31 MED ORDER — FREESTYLE LIBRE 3 SENSOR MISC
2 refills | Status: DC
Start: 2023-07-31 — End: 2024-03-20

## 2023-07-31 NOTE — Telephone Encounter (Signed)
Pt requested for all her meds to be transferred to [facility]. I called [facility] to inform them about pt request. They will cancel prescriptions as I spoke to [facility] pharmacy staff. Lurena Joiner verbally relayed to me what medicines had a certain amount of refills remaining so I could send the new prescriptions with [facility].

## 2023-09-03 ENCOUNTER — Ambulatory Visit: Payer: 59 | Admitting: Physician Assistant

## 2023-09-09 ENCOUNTER — Ambulatory Visit: Payer: 59 | Admitting: Physician Assistant

## 2023-09-09 ENCOUNTER — Ambulatory Visit: Payer: BC Managed Care – PPO | Admitting: Nurse Practitioner

## 2023-09-09 VITALS — BP 124/74 | HR 91 | Temp 97.1°F | Resp 16 | Ht 64.0 in | Wt 202.7 lb

## 2023-09-09 DIAGNOSIS — E1169 Type 2 diabetes mellitus with other specified complication: Secondary | ICD-10-CM | POA: Diagnosis not present

## 2023-09-09 DIAGNOSIS — E785 Hyperlipidemia, unspecified: Secondary | ICD-10-CM

## 2023-09-09 DIAGNOSIS — E1165 Type 2 diabetes mellitus with hyperglycemia: Secondary | ICD-10-CM | POA: Diagnosis not present

## 2023-09-09 DIAGNOSIS — N184 Chronic kidney disease, stage 4 (severe): Secondary | ICD-10-CM | POA: Diagnosis not present

## 2023-09-09 DIAGNOSIS — I10 Essential (primary) hypertension: Secondary | ICD-10-CM | POA: Diagnosis not present

## 2023-09-09 DIAGNOSIS — Z794 Long term (current) use of insulin: Secondary | ICD-10-CM

## 2023-09-09 DIAGNOSIS — E66812 Obesity, class 2: Secondary | ICD-10-CM

## 2023-09-09 DIAGNOSIS — N951 Menopausal and female climacteric states: Secondary | ICD-10-CM

## 2023-09-09 DIAGNOSIS — M25512 Pain in left shoulder: Secondary | ICD-10-CM

## 2023-09-09 DIAGNOSIS — Z6835 Body mass index (BMI) 35.0-35.9, adult: Secondary | ICD-10-CM

## 2023-09-09 MED ORDER — METHYLPREDNISOLONE 4 MG PO TBPK
ORAL_TABLET | ORAL | 0 refills | Status: DC
Start: 2023-09-09 — End: 2024-01-13

## 2023-09-09 MED ORDER — JARDIANCE 10 MG PO TABS: 10.0000 mg | ORAL_TABLET | Freq: Every day | ORAL | 1 refills | Status: DC

## 2023-09-09 NOTE — Assessment & Plan Note (Signed)
Labs today

## 2023-09-09 NOTE — Assessment & Plan Note (Signed)
Continue Lantus 60 units daily, Ozempic 2 mg weekly and Jardiance 10 mg daily.

## 2023-09-09 NOTE — Assessment & Plan Note (Signed)
Continue effexor and clonidine

## 2023-09-09 NOTE — Assessment & Plan Note (Signed)
Continue atorvastatin 80 mg daily and Zetia 10 mg daily

## 2023-09-09 NOTE — Assessment & Plan Note (Signed)
Working on lifestyle modification.  Currently taking ozempic

## 2023-09-09 NOTE — Assessment & Plan Note (Signed)
Continue amlodipine 10 mg daily, carvedilol 12.5 mg twice daily, hydralazine 10 mg 3 times a day, losartan 100 mg daily

## 2023-09-09 NOTE — Progress Notes (Signed)
BP 124/74   Pulse 91   Temp (!) 97.1 F (36.2 C) (Temporal)   Resp 16   Ht 5\' 4"  (1.626 m)   Wt 202 lb 11.2 oz (91.9 kg)   LMP 05/29/2018   SpO2 96%   BMI 34.79 kg/m    Subjective:    Patient ID: Janice Jennings, female    DOB: 02-24-1971, 52 y.o.   MRN: [id number]  HPI: Janice Jennings is a 52 y.o. female  Chief Complaint  Patient presents with   Diabetes   Hypertension   Hyperlipidemia   Diabetes, Type 2:  -Last A1c 6.0 -Medications: lantus 60 units daily, ozempic 2 mg weekly, jardiance 10 mg daily -Patient is compliant with the above medications and reports no side effects.  -Checking BG at home: yes -Fasting home BG: 120 -Diet: reduce sugar and processed foods in your diet  -Exercise: recommend 150 min of physical activity weekly   -Eye exam: utd -Foot exam: utd -Microalbumin: utd -Statin: yes -PNA vaccine: no -Denies symptoms of hypoglycemia, polyuria, polydipsia, numbness extremities, foot ulcers/trauma.    Hypertension:  -Medications: amlodipine 10 mg daily, carvedilol 12.5 mg BID, hydralazine 10 mg three times a day, losartan 100 mg daily,  -Patient is compliant with above medications and reports no side effects. -Checking BP at home (average): does not check at home.  -Denies any SOB, CP, vision changes, LE edema or symptoms of hypotension -Diet: recommend DASH diet  -Exercise: recommend 150 min of physical activity weekly       09/09/2023    1:01 PM 06/03/2023    4:35 PM 06/03/2023    3:53 PM  Vitals with BMI  Height 5\' 4"   5\' 4"   Weight 202 lbs 11 oz  202 lbs  BMI 34.78  34.66  Systolic 124 138 086  Diastolic 74 76 80  Pulse 91  85    HLD:  -Medications: atorvastatin 80 mg daily, zetia 10 mg daily -Patient is compliant with above medications and reports no side effects.  -Last lipid panel:  Lipid Panel     Component Value Date/Time   CHOL 126 11/28/2022 1034   TRIG 70 11/28/2022 1034   HDL 51 11/28/2022 1034   CHOLHDL 2.5 11/28/2022 1034    VLDL 29.0 06/25/2016 1049   LDLCALC 60 11/28/2022 1034    Obesity:  Current weight : 202 lbs BMI: 34.79 Previous weight:202 lbs Treatment Tried: on ozempic Comorbidities: HTN, HLD, DM  Menopause/hot flashes: currently on effexor 75 mg daily, and clonidine patch.     09/09/2023    1:01 PM 06/03/2023    3:52 PM 03/18/2023    9:13 AM 03/01/2023    3:26 PM 11/28/2022    9:48 AM  Depression screen PHQ 2/9  Decreased Interest 0 0 0 0 0  Down, Depressed, Hopeless 0 0 0 0 0  PHQ - 2 Score 0 0 0 0 0  Altered sleeping 0 0 0 0 0  Tired, decreased energy 0 0 0 0 0  Change in appetite 0 0 0 0 0  Feeling bad or failure about yourself  0 0 0 0 0  Trouble concentrating 0 0 0 0 0  Moving slowly or fidgety/restless 0 0 0 0 0  Suicidal thoughts 0 0 0 0 0  PHQ-9 Score 0 0 0 0 0  Difficult doing work/chores Not difficult at all Not difficult at all Not difficult at all Not difficult at all Not difficult at all  Left shoulder pain:  she denies any new injury. She says that it radiates up her neck and into her back.  She says that it has been hurting for about a week.  She says she has not been able to take anything due to her kidney function.  She is currently taking flexeril  she says it helps her sleep but can not take it during the day.  She does have decrease range of motion.   Relevant past medical, surgical, family and social history reviewed and updated as indicated. Interim medical history since our last visit reviewed. Allergies and medications reviewed and updated.  Review of Systems  Constitutional: Negative for fever or weight change.  Respiratory: Negative for cough and shortness of breath.   Cardiovascular: Negative for chest pain or palpitations.  Gastrointestinal: Negative for abdominal pain, no bowel changes.  Musculoskeletal: Negative for gait problem or joint swelling.  Skin: Negative for rash.  Neurological: Negative for dizziness or headache.  No other specific  complaints in a complete review of systems (except as listed in HPI above).      Objective:    BP 124/74   Pulse 91   Temp (!) 97.1 F (36.2 C) (Temporal)   Resp 16   Ht 5\' 4"  (1.626 m)   Wt 202 lb 11.2 oz (91.9 kg)   LMP 05/29/2018   SpO2 96%   BMI 34.79 kg/m   Wt Readings from Last 3 Encounters:  09/09/23 202 lb 11.2 oz (91.9 kg)  06/03/23 202 lb (91.6 kg)  04/03/23 202 lb (91.6 kg)    Physical Exam  Constitutional: Patient appears well-developed and well-nourished. Obese  No distress.  HEENT: head atraumatic, normocephalic, pupils equal and reactive to light, neck supple, throat within normal limits Cardiovascular: Normal rate, regular rhythm and normal heart sounds.  No murmur heard. No BLE edema. Pulmonary/Chest: Effort normal and breath sounds normal. No respiratory distress. Abdominal: Soft.  There is no tenderness. Psychiatric: Patient has a normal mood and affect. behavior is normal. Judgment and thought content normal.  Results for orders placed or performed in visit on 06/03/23  COMPLETE METABOLIC PANEL WITH GFR  Result Value Ref Range   Glucose, Bld 109 (H) 65 - 99 mg/dL   BUN 35 (H) 7 - 25 mg/dL   Creat 3.24 (H) 4.01 - 1.03 mg/dL   eGFR 26 (L) > OR = 60 mL/min/1.52m2   BUN/Creatinine Ratio 16 6 - 22 (calc)   Sodium 141 135 - 146 mmol/L   Potassium 4.7 3.5 - 5.3 mmol/L   Chloride 107 98 - 110 mmol/L   CO2 27 20 - 32 mmol/L   Calcium 10.4 8.6 - 10.4 mg/dL   Total Protein 7.9 6.1 - 8.1 g/dL   Albumin 4.5 3.6 - 5.1 g/dL   Globulin 3.4 1.9 - 3.7 g/dL (calc)   AG Ratio 1.3 1.0 - 2.5 (calc)   Total Bilirubin 0.6 0.2 - 1.2 mg/dL   Alkaline phosphatase (APISO) 85 37 - 153 U/L   AST 13 10 - 35 U/L   ALT 19 6 - 29 U/L      Assessment & Plan:   Problem List Items Addressed This Visit       Cardiovascular and Mediastinum   Essential hypertension (Chronic)    Continue amlodipine 10 mg daily, carvedilol 12.5 mg twice daily, hydralazine 10 mg 3 times a day,  losartan 100 mg daily      Relevant Orders   CBC with Differential/Platelet  COMPLETE METABOLIC PANEL WITH GFR   Hot flashes due to menopause    Continue effexor and clonidine         Endocrine   Hyperlipidemia associated with type 2 diabetes mellitus (HCC)    Continue atorvastatin 80 mg daily and Zetia 10 mg daily.      Relevant Medications   JARDIANCE 10 MG TABS tablet   Other Relevant Orders   COMPLETE METABOLIC PANEL WITH GFR   Lipid panel   Type 2 diabetes mellitus with hyperglycemia, with long-term current use of insulin (HCC) - Primary    Continue Lantus 60 units daily, Ozempic 2 mg weekly and Jardiance 10 mg daily.      Relevant Medications   JARDIANCE 10 MG TABS tablet   Other Relevant Orders   COMPLETE METABOLIC PANEL WITH GFR   Hemoglobin A1c     Genitourinary   Chronic kidney disease, stage IV (severe) (HCC)    Labs today.      Relevant Orders   COMPLETE METABOLIC PANEL WITH GFR     Other   Class 2 severe obesity with serious comorbidity and body mass index (BMI) of 35.0 to 35.9 in adult [facility])    Working on lifestyle modification.  Currently taking ozempic      Relevant Medications   JARDIANCE 10 MG TABS tablet   Other Visit Diagnoses     Acute pain of left shoulder       Start Medrol Dosepak continue Flexeril.  Using heat therapy. if no improvement will refer to orthopedics.   Relevant Medications   methylPREDNISolone (MEDROL DOSEPAK) 4 MG TBPK tablet        Follow up plan: Return in about 4 months (around 01/07/2024) for follow up, Leisa.

## 2023-09-10 LAB — CBC WITH DIFFERENTIAL/PLATELET
Absolute Lymphocytes: 2092 {cells}/uL (ref 850–3900)
Absolute Monocytes: 462 {cells}/uL (ref 200–950)
Basophils Absolute: 33 {cells}/uL (ref 0–200)
Basophils Relative: 0.5 %
Eosinophils Absolute: 99 {cells}/uL (ref 15–500)
Eosinophils Relative: 1.5 %
HCT: 39.1 % (ref 35.0–45.0)
Hemoglobin: 12.7 g/dL (ref 11.7–15.5)
MCH: 29.9 pg (ref 27.0–33.0)
MCHC: 32.5 g/dL (ref 32.0–36.0)
MCV: 92 fL (ref 80.0–100.0)
MPV: 11.2 fL (ref 7.5–12.5)
Monocytes Relative: 7 %
Neutro Abs: 3914 {cells}/uL (ref 1500–7800)
Neutrophils Relative %: 59.3 %
Platelets: 287 10*3/uL (ref 140–400)
RBC: 4.25 10*6/uL (ref 3.80–5.10)
RDW: 12.4 % (ref 11.0–15.0)
Total Lymphocyte: 31.7 %
WBC: 6.6 10*3/uL (ref 3.8–10.8)

## 2023-09-10 LAB — COMPLETE METABOLIC PANEL WITH GFR
AG Ratio: 1.2 (calc) (ref 1.0–2.5)
ALT: 20 U/L (ref 6–29)
AST: 17 U/L (ref 10–35)
Albumin: 4.1 g/dL (ref 3.6–5.1)
Alkaline phosphatase (APISO): 93 U/L (ref 37–153)
BUN/Creatinine Ratio: 15 (calc) (ref 6–22)
BUN: 32 mg/dL — ABNORMAL HIGH (ref 7–25)
CO2: 26 mmol/L (ref 20–32)
Calcium: 9.8 mg/dL (ref 8.6–10.4)
Chloride: 107 mmol/L (ref 98–110)
Creat: 2.15 mg/dL — ABNORMAL HIGH (ref 0.50–1.03)
Globulin: 3.4 g/dL (ref 1.9–3.7)
Glucose, Bld: 138 mg/dL — ABNORMAL HIGH (ref 65–99)
Potassium: 4.4 mmol/L (ref 3.5–5.3)
Sodium: 142 mmol/L (ref 135–146)
Total Bilirubin: 0.5 mg/dL (ref 0.2–1.2)
Total Protein: 7.5 g/dL (ref 6.1–8.1)
eGFR: 27 mL/min/{1.73_m2} — ABNORMAL LOW (ref >=60)

## 2023-09-10 LAB — LIPID PANEL
Cholesterol: 117 mg/dL (ref <200)
HDL: 48 mg/dL — ABNORMAL LOW (ref >=50)
LDL Cholesterol (Calc): 51 mg/dL
Non-HDL Cholesterol (Calc): 69 mg/dL (ref <130)
Total CHOL/HDL Ratio: 2.4 (calc) (ref <5.0)
Triglycerides: 93 mg/dL (ref <150)

## 2023-09-10 LAB — HEMOGLOBIN A1C
Hgb A1c MFr Bld: 7.8 %{Hb} — ABNORMAL HIGH (ref <5.7)
Mean Plasma Glucose: 177 mg/dL
eAG (mmol/L): 9.8 mmol/L

## 2023-10-11 ENCOUNTER — Other Ambulatory Visit: Payer: Self-pay | Admitting: Nurse Practitioner

## 2023-10-11 ENCOUNTER — Encounter: Payer: Self-pay | Admitting: Family Medicine

## 2023-10-11 DIAGNOSIS — I10 Essential (primary) hypertension: Secondary | ICD-10-CM

## 2023-10-11 MED ORDER — LOSARTAN POTASSIUM 100 MG PO TABS: 100.0000 mg | ORAL_TABLET | Freq: Every day | ORAL | 1 refills | Status: DC

## 2023-10-28 ENCOUNTER — Other Ambulatory Visit: Payer: Self-pay | Admitting: Family Medicine

## 2023-10-28 DIAGNOSIS — N951 Menopausal and female climacteric states: Secondary | ICD-10-CM

## 2023-11-07 LAB — HM DIABETES EYE EXAM

## 2023-11-11 ENCOUNTER — Other Ambulatory Visit: Payer: Self-pay | Admitting: Family Medicine

## 2023-11-11 DIAGNOSIS — E1142 Type 2 diabetes mellitus with diabetic polyneuropathy: Secondary | ICD-10-CM

## 2023-11-11 DIAGNOSIS — E1165 Type 2 diabetes mellitus with hyperglycemia: Secondary | ICD-10-CM

## 2023-12-09 ENCOUNTER — Other Ambulatory Visit: Payer: Self-pay | Admitting: Nurse Practitioner

## 2023-12-09 DIAGNOSIS — Z794 Long term (current) use of insulin: Secondary | ICD-10-CM

## 2023-12-09 LAB — MICROALBUMIN / CREATININE URINE RATIO: Microalb Creat Ratio: 251.8

## 2023-12-10 NOTE — Telephone Encounter (Signed)
Labs in date.  Requested Prescriptions  Pending Prescriptions Disp Refills   JARDIANCE 10 MG TABS tablet [Pharmacy Med Name: JARDIANCE 10 MG TABLET] 90 tablet 1    Sig: TAKE 1 TABLET BY MOUTH EVERY DAY     Endocrinology:  Diabetes - SGLT2 Inhibitors Failed - 12/10/2023  3:47 PM      Failed - Cr in normal range and within 360 days    Creat  Date Value Ref Range Status  09/09/2023 2.15 (H) 0.50 - 1.03 mg/dL Final   Creatinine,U  Date Value Ref Range Status  10/20/2014 83.0 mg/dL Final   Creatinine, Urine  Date Value Ref Range Status  03/01/2023 77 20 - 275 mg/dL Final         Failed - eGFR in normal range and within 360 days    GFR, Est African American  Date Value Ref Range Status  03/14/2021 30 (L) > OR = 60 mL/min/1.67m2 Final   GFR, Est Non African American  Date Value Ref Range Status  03/14/2021 26 (L) > OR = 60 mL/min/1.47m2 Final   GFR  Date Value Ref Range Status  06/25/2016 71.37 >60.00 mL/min Final   eGFR  Date Value Ref Range Status  09/09/2023 27 (L) > OR = 60 mL/min/1.55m2 Final         Passed - HBA1C is between 0 and 7.9 and within 180 days    Hemoglobin A1C  Date Value Ref Range Status  08/20/2022 6.6  Final   Hgb A1c MFr Bld  Date Value Ref Range Status  09/09/2023 7.8 (H) <5.7 % of total Hgb Final    Comment:    For someone without known diabetes, a hemoglobin A1c value of 6.5% or greater indicates that they may have  diabetes and this should be confirmed with a follow-up  test. . For someone with known diabetes, a value <7% indicates  that their diabetes is well controlled and a value  greater than or equal to 7% indicates suboptimal  control. A1c targets should be individualized based on  duration of diabetes, age, comorbid conditions, and  other considerations. . Currently, no consensus exists regarding use of hemoglobin A1c for diagnosis of diabetes for children. Verna Czech - Valid encounter within last 6 months    Recent  Outpatient Visits           3 months ago Type 2 diabetes mellitus with hyperglycemia, with long-term current use of insulin [facility])   [place] [facility] Della Goo F, FNP   6 months ago Type 2 diabetes mellitus with hyperglycemia, with long-term current use of insulin [facility])   [place] [facility] Danelle Berry, PA-C   8 months ago Type 2 diabetes mellitus with hyperglycemia, with long-term current use of insulin [facility])   [place] [facility] Danelle Berry, PA-C   9 months ago Essential hypertension   [place] [facility] Danelle Berry, PA-C   1 year ago DM type 2 with diabetic peripheral neuropathy [facility])   [place] [facility] Mecum, Oswaldo Conroy, [place]

## 2023-12-27 ENCOUNTER — Other Ambulatory Visit: Payer: Self-pay | Admitting: Family Medicine

## 2023-12-27 DIAGNOSIS — M5416 Radiculopathy, lumbar region: Secondary | ICD-10-CM

## 2023-12-27 MED ORDER — LANTUS SOLOSTAR 100 UNIT/ML ~~LOC~~ SOPN: 60.0000 [IU] | PEN_INJECTOR | Freq: Every day | SUBCUTANEOUS | 0 refills | Status: DC

## 2023-12-27 NOTE — Telephone Encounter (Signed)
 Appt sch'd for the 18th of March

## 2023-12-27 NOTE — Telephone Encounter (Signed)
Needs dm f/u

## 2023-12-27 NOTE — Telephone Encounter (Signed)
 Pt would like this sent to [facility]

## 2024-01-07 ENCOUNTER — Other Ambulatory Visit: Payer: Self-pay

## 2024-01-07 DIAGNOSIS — E1165 Type 2 diabetes mellitus with hyperglycemia: Secondary | ICD-10-CM

## 2024-01-07 MED ORDER — LANTUS SOLOSTAR 100 UNIT/ML ~~LOC~~ SOPN: 60.0000 [IU] | PEN_INJECTOR | Freq: Every day | SUBCUTANEOUS | 0 refills | Status: DC

## 2024-01-14 ENCOUNTER — Ambulatory Visit (INDEPENDENT_AMBULATORY_CARE_PROVIDER_SITE_OTHER): Payer: 59 | Admitting: Family Medicine

## 2024-01-14 ENCOUNTER — Encounter: Payer: Self-pay | Admitting: Family Medicine

## 2024-01-14 DIAGNOSIS — I10 Essential (primary) hypertension: Secondary | ICD-10-CM

## 2024-01-14 DIAGNOSIS — D631 Anemia in chronic kidney disease: Secondary | ICD-10-CM

## 2024-01-14 DIAGNOSIS — N951 Menopausal and female climacteric states: Secondary | ICD-10-CM

## 2024-01-14 DIAGNOSIS — J4520 Mild intermittent asthma, uncomplicated: Secondary | ICD-10-CM

## 2024-01-14 DIAGNOSIS — Z794 Long term (current) use of insulin: Secondary | ICD-10-CM

## 2024-01-14 DIAGNOSIS — N2581 Secondary hyperparathyroidism of renal origin: Secondary | ICD-10-CM

## 2024-01-14 DIAGNOSIS — E1165 Type 2 diabetes mellitus with hyperglycemia: Secondary | ICD-10-CM

## 2024-01-14 DIAGNOSIS — Z1231 Encounter for screening mammogram for malignant neoplasm of breast: Secondary | ICD-10-CM

## 2024-01-14 DIAGNOSIS — E1142 Type 2 diabetes mellitus with diabetic polyneuropathy: Secondary | ICD-10-CM

## 2024-01-14 DIAGNOSIS — Z91199 Patient's noncompliance with other medical treatment and regimen due to unspecified reason: Secondary | ICD-10-CM

## 2024-01-14 DIAGNOSIS — N184 Chronic kidney disease, stage 4 (severe): Secondary | ICD-10-CM

## 2024-01-14 NOTE — Progress Notes (Unsigned)
 Name: Janice Jennings   MRN: [id number]    DOB: [birth year]   Date:01/14/2024       Progress Note NO show for appt    Subjective:   Janice Jennings is a 53 y.o. female, presents to clinic for routine follow up on chronic conditions  DM - IDDM managed on insulin, ozempic, jardiance, associated diabetic neuropathy, kidney disease and retinopathy Last labs A1c was elevated and uncontrolled, the office visit prior to that A1c was well-controlled but she was having episodes of low blood sugar Lab Results  Component Value Date   HGBA1C 7.8 (H) 09/09/2023   HGBA1C 6.6 (H) 03/01/2023   HGBA1C 7.7 (H) 11/28/2022   HGBA1C 6.6 08/20/2022   HGBA1C 5.8 (H) 01/29/2022  She is on renal protective medications, seeing nephrology, on a statin, is due for diabetic foot exam and all blood work today  Hypertension:  Currently managed on Norvasc 10 mg, carvedilol 12.5 mg twice daily, milligrams 3 times daily, losartan 100 mg Pt reports  med compliance and denies any SE.   Blood pressure today is  controlled. BP Readings from Last 3 Encounters:  09/09/23 124/74  06/03/23 138/76  04/03/23 116/75   Pt denies CP, SOB, exertional sx, LE edema, palpitation, Ha's, visual disturbances, lightheadedness, hypotension, syncope. Dietary efforts for BP?     Hyperlipidemia: Currently treated with atorvastatin 80 mg and Zetia, pt reports good med compliance Last Lipids: Recently done and reviewed, LDL controlled Lab Results  Component Value Date   CHOL 117 09/09/2023   HDL 48 (L) 09/09/2023   LDLCALC 51 09/09/2023   TRIG 93 09/09/2023   CHOLHDL 2.4 09/09/2023   Vasomotor symptoms of menopause she did have improvement in her symptoms with clonidine patch and effexor  Neuropathy she is managed with gabapentin  CKD stage 4 managed by [facility] nephrology  Reviewed last OV and notes through care everywhere Stage IV with secondary hyperparathyroidism and anemia of chronic disease Most recent labs from nephrology  were done December 09, 2023 and reviewed today, EGFR is 26, PTH 194.1 Vitamin D 40.6 albumin/creat ratio 251.8      Current Outpatient Medications:    amLODipine (NORVASC) 10 MG tablet, Take 1 tablet (10 mg total) by mouth daily., Disp: 90 tablet, Rfl: 1   atorvastatin (LIPITOR) 80 MG tablet, Take 1 tablet (80 mg total) by mouth at bedtime., Disp: 90 tablet, Rfl: 1   Blood Glucose Monitoring Suppl DEVI, 1 each by Does not apply route in the morning, at noon, and at bedtime. E11.65, Z79.4 Check BG TID May substitute to any manufacturer covered by patient's insurance., Disp: 1 each, Rfl: 0   carvedilol (COREG) 12.5 MG tablet, Take 1 tablet (12.5 mg total) by mouth 2 (two) times daily with a meal., Disp: 180 tablet, Rfl: 1   cloNIDine (CATAPRES - DOSED IN MG/24 HR) 0.1 mg/24hr patch, Place 1 patch (0.1 mg total) onto the skin once a week., Disp: 4 patch, Rfl: 8   Continuous Glucose Sensor (FREESTYLE LIBRE 3 SENSOR) MISC, APPLY 1 SENSOR EVERY 14 DAYS, Disp: 9 each, Rfl: 2   cyclobenzaprine (FLEXERIL) 5 MG tablet, TAKE 1 TABLET BY MOUTH THREE TIMES A DAY AS NEEDED FOR MUSCLE SPASMS, Disp: 30 tablet, Rfl: 0   ezetimibe (ZETIA) 10 MG tablet, Take 1 tablet (10 mg total) by mouth daily., Disp: 90 tablet, Rfl: 3   ferrous sulfate 325 (65 FE) MG tablet, Take 325 mg by mouth daily with breakfast., Disp: , Rfl:    Gabapentin (  NEURAPTINE) 10 % CREA, Apply 1 Application topically daily as needed (pain to affected area)., Disp: 1 g, Rfl: 5   gabapentin (NEURONTIN) 300 MG capsule, Take 1 capsule (300 mg total) by mouth 2 (two) times daily., Disp: 180 capsule, Rfl: 2   Glucose Blood (BLOOD GLUCOSE TEST STRIPS) STRP, 1 each by In Vitro route in the morning, at noon, and at bedtime. E11.65, Z79.4 Check BG TID May substitute to any manufacturer covered by patient's insurance., Disp: 100 strip, Rfl: 3   hydrALAZINE (APRESOLINE) 100 MG tablet, Take 1 tablet (100 mg total) by mouth 3 (three) times daily., Disp: 270  tablet, Rfl: 1   insulin glargine (LANTUS SOLOSTAR) 100 UNIT/ML Solostar Pen, Inject 60 Units into the skin at bedtime., Disp: 45 mL, Rfl: 0   Insulin Pen Needle (BD PEN NEEDLE NANO U/F) 32G X 4 MM MISC, USE AS DIRECTED WITH INSULIN  DAILY, Disp: 90 each, Rfl: 2   JARDIANCE 10 MG TABS tablet, TAKE 1 TABLET BY MOUTH EVERY DAY, Disp: 90 tablet, Rfl: 0   Lancets Misc. MISC, 1 each by Does not apply route in the morning, at noon, and at bedtime. E11.65, Z79.4 Check BG TID May substitute to any manufacturer covered by patient's insurance., Disp: 100 each, Rfl: 5   lidocaine (LIDODERM) 5 %, Place 1 patch onto the skin daily. Remove & Discard patch  within 24 h or as directed by MD, Disp: 30 patch, Rfl: 0   losartan (COZAAR) 100 MG tablet, Take 1 tablet (100 mg total) by mouth daily., Disp: 90 tablet, Rfl: 1   Multiple Vitamin (MULTIVITAMIN) tablet, Take 1 tablet by mouth daily., Disp: , Rfl:    nystatin cream (MYCOSTATIN), Apply 1 Application topically 2 (two) times daily., Disp: 30 g, Rfl: 0   Semaglutide, 2 MG/DOSE, (OZEMPIC, 2 MG/DOSE,) 8 MG/3ML SOPN, INJECT 2 MG INTO THE SKIN ONCE A WEEK., Disp: 9 mL, Rfl: 0   venlafaxine XR (EFFEXOR-XR) 75 MG 24 hr capsule, TAKE 1 CAPSULE BY MOUTH DAILY WITH BREAKFAST., Disp: 90 capsule, Rfl: 0  Patient Active Problem List   Diagnosis Date Noted   Elevated sedimentation rate 08/01/2021   Rheumatoid factor positive 08/01/2021   Bilateral wrist pain 08/01/2021   Chronic kidney disease, stage IV (severe) (HCC) 04/25/2021   Secondary hyperparathyroidism of renal origin (HCC) 10/27/2020   Mild intermittent asthma without complication 10/27/2020   Hot flashes due to menopause 07/08/2020   Osteoarthritis of knee 07/08/2020   Pain in joint of left shoulder 07/08/2020   Lumbar radiculopathy 11/10/2019   Type 2 diabetes mellitus with hyperglycemia, with long-term current use of insulin (HCC) 09/22/2019   Anemia in chronic kidney disease 08/31/2019   Benign  hypertensive kidney disease with chronic kidney disease 08/31/2019   Proteinuria 08/31/2019   Type 2 diabetes mellitus with diabetic chronic kidney disease (HCC) 08/31/2019   Hyperlipidemia associated with type 2 diabetes mellitus (HCC) 08/10/2019   Hemoptysis 11/15/2018   Class 2 severe obesity with serious comorbidity and body mass index (BMI) of 35.0 to 35.9 in adult (HCC) 09/22/2018   Diabetic retinopathy of both eyes associated with type 2 diabetes mellitus (HCC) 04/11/2018   Essential hypertension 03/07/2015   DM type 2 with diabetic peripheral neuropathy (HCC) 05/26/2014    Past Surgical History:  Procedure Laterality Date   BREAST REDUCTION SURGERY  2001   CATARACT EXTRACTION W/PHACO Left 04/03/2023   Procedure: CATARACT EXTRACTION PHACO AND INTRAOCULAR LENS PLACEMENT (IOC) LEFT DIABETIC;  Surgeon: Lockie Mola, MD;  Location: [facility];  Service: Ophthalmology;  Laterality: Left;  2.57 0:21.7   CESAREAN SECTION     COLONOSCOPY WITH PROPOFOL N/A 08/15/2018   Procedure: COLONOSCOPY WITH PROPOFOL;  Surgeon: Wyline Mood, MD;  Location[facility] ENDOSCOPY;  Service: Gastroenterology;  Laterality: N/A;   ESOPHAGOGASTRODUODENOSCOPY (EGD) WITH PROPOFOL N/A 08/15/2018   Procedure: ESOPHAGOGASTRODUODENOSCOPY (EGD) WITH PROPOFOL;  Surgeon: Wyline Mood, MD;  Location[facility] ENDOSCOPY;  Service: Gastroenterology;  Laterality: N/A;   GIVENS CAPSULE STUDY N/A 09/08/2018   Procedure: GIVENS CAPSULE STUDY;  Surgeon: Wyline Mood, MD;  Location: [facility] ENDOSCOPY;  Service: Gastroenterology;  Laterality: N/A;   REDUCTION MAMMAPLASTY Bilateral 2000   SHOULDER SURGERY Left    TUBAL LIGATION      Family History  Problem Relation Age of Onset   Diabetes Father    Hyperlipidemia Father    Hypertension Father    Cancer Maternal Aunt 45       leukemia - died   Diabetes Maternal Grandmother    Cancer Maternal Grandmother        breast cancer   Stroke Maternal Grandmother    Breast  cancer Maternal Grandmother    Diabetes Daughter        Pre-diabetes   Diabetes Maternal Grandfather    Hyperlipidemia Maternal Grandfather    Hypertension Maternal Grandfather    Cancer Maternal Aunt 50       breast cancer - died   Ovarian cancer Neg Hx    Colon cancer Neg Hx     Social History   Tobacco Use   Smoking status: Former    Current packs/day: 0.00    Types: Cigarettes    Quit date: 2001    Years since quitting: 24.2   Smokeless tobacco: Never   Tobacco comments:    Smoked for 1 year.  2001  Vaping Use   Vaping status: Never Used  Substance Use Topics   Alcohol use: Yes    Alcohol/week: 0.0 standard drinks of alcohol    Comment: occ   Drug use: No     Allergies  Allergen Reactions   Hydrocodone Itching    Health Maintenance  Topic Date Due   Diabetic kidney evaluation - Urine ACR  02/29/2024   FOOT EXAM  02/29/2024   HEMOGLOBIN A1C  03/08/2024   OPHTHALMOLOGY EXAM  03/10/2024   MAMMOGRAM  04/22/2024   DTaP/Tdap/Td (2 - Tdap) 05/26/2024   Cervical Cancer Screening (HPV/Pap Cotest)  08/23/2024   Diabetic kidney evaluation - eGFR measurement  09/08/2024   Colonoscopy  08/15/2028   Pneumococcal Vaccine 77-63 Years old  Completed   INFLUENZA VACCINE  Completed   COVID-19 Vaccine  Completed   Hepatitis C Screening  Completed   HIV Screening  Completed   Zoster Vaccines- Shingrix  Completed   HPV VACCINES  Aged Out    Chart Review Today:  Review of Systems   Objective:   There were no vitals filed for this visit.  There is no height or weight on file to calculate BMI.  Physical Exam   Functional Status Survey:   Results for orders placed or performed in visit on 09/09/23  CBC with Differential/Platelet   Collection Time: 09/09/23  1:22 PM  Result Value Ref Range   WBC 6.6 3.8 - 10.8 Thousand/uL   RBC 4.25 3.80 - 5.10 Million/uL   Hemoglobin 12.7 11.7 - 15.5 g/dL   HCT 40.9 81.1 - 91.4 %   MCV 92.0 80.0 - 100.0 fL   MCH 29.9  27.0 -  33.0 pg   MCHC 32.5 32.0 - 36.0 g/dL   RDW 10.2 72.5 - 36.6 %   Platelets 287 140 - 400 Thousand/uL   MPV 11.2 7.5 - 12.5 fL   Neutro Abs 3,914 1,500 - 7,800 cells/uL   Absolute Lymphocytes 2,092 850 - 3,900 cells/uL   Absolute Monocytes 462 200 - 950 cells/uL   Eosinophils Absolute 99 15 - 500 cells/uL   Basophils Absolute 33 0 - 200 cells/uL   Neutrophils Relative % 59.3 %   Total Lymphocyte 31.7 %   Monocytes Relative 7.0 %   Eosinophils Relative 1.5 %   Basophils Relative 0.5 %  COMPLETE METABOLIC PANEL WITH GFR   Collection Time: 09/09/23  1:22 PM  Result Value Ref Range   Glucose, Bld 138 (H) 65 - 99 mg/dL   BUN 32 (H) 7 - 25 mg/dL   Creat 4.40 (H) 3.47 - 1.03 mg/dL   eGFR 27 (L) > OR = 60 mL/min/1.25m2   BUN/Creatinine Ratio 15 6 - 22 (calc)   Sodium 142 135 - 146 mmol/L   Potassium 4.4 3.5 - 5.3 mmol/L   Chloride 107 98 - 110 mmol/L   CO2 26 20 - 32 mmol/L   Calcium 9.8 8.6 - 10.4 mg/dL   Total Protein 7.5 6.1 - 8.1 g/dL   Albumin 4.1 3.6 - 5.1 g/dL   Globulin 3.4 1.9 - 3.7 g/dL (calc)   AG Ratio 1.2 1.0 - 2.5 (calc)   Total Bilirubin 0.5 0.2 - 1.2 mg/dL   Alkaline phosphatase (APISO) 93 37 - 153 U/L   AST 17 10 - 35 U/L   ALT 20 6 - 29 U/L  Lipid panel   Collection Time: 09/09/23  1:22 PM  Result Value Ref Range   Cholesterol 117 <200 mg/dL   HDL 48 (L) > OR = 50 mg/dL   Triglycerides 93 <425 mg/dL   LDL Cholesterol (Calc) 51 mg/dL (calc)   Total CHOL/HDL Ratio 2.4 <5.0 (calc)   Non-HDL Cholesterol (Calc) 69 <956 mg/dL (calc)  Hemoglobin L8V   Collection Time: 09/09/23  1:22 PM  Result Value Ref Range   Hgb A1c MFr Bld 7.8 (H) <5.7 % of total Hgb   Mean Plasma Glucose 177 mg/dL   eAG (mmol/L) 9.8 mmol/L      Assessment & Plan:   No-show for appointment  Chronic kidney disease, stage IV (severe) (HCC)  Type 2 diabetes mellitus with hyperglycemia, with long-term current use of insulin (HCC)  Essential hypertension  Hot flashes due to  menopause  Breast cancer screening by mammogram  Anemia in stage 4 chronic kidney disease (HCC)  Secondary hyperparathyroidism of renal origin (HCC)  Mild intermittent asthma without complication  DM type 2 with diabetic peripheral neuropathy (HCC)     No follow-ups on file.   Danelle Berry, PA-C 01/14/24 1:10 PM

## 2024-01-17 ENCOUNTER — Ambulatory Visit (INDEPENDENT_AMBULATORY_CARE_PROVIDER_SITE_OTHER): Admitting: Family Medicine

## 2024-01-17 VITALS — BP 140/90 | HR 76 | Temp 97.9°F | Resp 16 | Ht 64.0 in | Wt 205.6 lb

## 2024-01-17 DIAGNOSIS — N951 Menopausal and female climacteric states: Secondary | ICD-10-CM

## 2024-01-17 DIAGNOSIS — N184 Chronic kidney disease, stage 4 (severe): Secondary | ICD-10-CM

## 2024-01-17 DIAGNOSIS — E66812 Obesity, class 2: Secondary | ICD-10-CM

## 2024-01-17 DIAGNOSIS — E1165 Type 2 diabetes mellitus with hyperglycemia: Secondary | ICD-10-CM

## 2024-01-17 DIAGNOSIS — Z6835 Body mass index (BMI) 35.0-35.9, adult: Secondary | ICD-10-CM

## 2024-01-17 DIAGNOSIS — E1142 Type 2 diabetes mellitus with diabetic polyneuropathy: Secondary | ICD-10-CM

## 2024-01-17 DIAGNOSIS — E1169 Type 2 diabetes mellitus with other specified complication: Secondary | ICD-10-CM | POA: Diagnosis not present

## 2024-01-17 DIAGNOSIS — E785 Hyperlipidemia, unspecified: Secondary | ICD-10-CM

## 2024-01-17 DIAGNOSIS — Z794 Long term (current) use of insulin: Secondary | ICD-10-CM

## 2024-01-17 LAB — POCT GLYCOSYLATED HEMOGLOBIN (HGB A1C): Hemoglobin A1C: 7.7 % — AB (ref 4.0–5.6)

## 2024-01-17 MED ORDER — VENLAFAXINE HCL ER 75 MG PO CP24: 75.0000 mg | ORAL_CAPSULE | Freq: Every day | ORAL | 3 refills | Status: AC

## 2024-01-17 MED ORDER — TIRZEPATIDE 10 MG/0.5ML ~~LOC~~ SOAJ
10.0000 mg | SUBCUTANEOUS | 1 refills | Status: DC
Start: 2024-03-13 — End: 2024-07-21

## 2024-01-17 MED ORDER — TIRZEPATIDE 7.5 MG/0.5ML ~~LOC~~ SOAJ: 7.5000 mg | SUBCUTANEOUS | 0 refills | Status: AC

## 2024-01-17 MED ORDER — GABAPENTIN 300 MG PO CAPS
300.0000 mg | ORAL_CAPSULE | Freq: Two times a day (BID) | ORAL | 2 refills | Status: AC
Start: 2024-01-17 — End: ?

## 2024-01-17 MED ORDER — TIRZEPATIDE 5 MG/0.5ML ~~LOC~~ SOAJ: 5.0000 mg | SUBCUTANEOUS | 0 refills | Status: AC

## 2024-01-17 NOTE — Assessment & Plan Note (Addendum)
 Uncontrolled as of last OV, the visit before I saw the pt and A1C was well controlled but she was having low blood sugar episodes Recheck POC A1C today Lab Results  Component Value Date   HGBA1C 7.8 (H) 09/09/2023   HGBA1C 6.6 (H) 03/01/2023   HGBA1C 7.7 (H) 11/28/2022   HGBA1C 6.6 08/20/2022   HGBA1C 5.8 (H) 01/29/2022  Insulin - basal and mealtime, ozempic, jardiance  Her A1c today was higher, she has not been monitoring her sugars, just put on CGM, we will have her return and review CGM reports to allow [place] to safely adjust insulins Past higher basal insulin was causing hypoglycemia

## 2024-01-17 NOTE — Assessment & Plan Note (Signed)
 Lipids recently checked, managed on lipitor 80 and zetia, LDL at goal - 51 Continue monitoring labs annually, watching LFTs and tolerance

## 2024-01-17 NOTE — Progress Notes (Signed)
 Name: Janice Jennings   MRN: [id number]    DOB: Sep 02, 1971   Date:01/17/2024       Progress Note  Chief Complaint  Patient presents with   Medical Management of Chronic Issues    Follow-up T2DM, HTN      Subjective:   Janice Jennings is a 53 y.o. female, presents to clinic for routine follow up on chronic conditions  DM - IDDM managed on insulin, ozempic, jardiance, associated diabetic neuropathy, kidney disease and retinopathy Last labs A1c was elevated and uncontrolled, the office visit prior to that A1c was well-controlled but she was having episodes of low blood sugar Lab Results  Component Value Date   HGBA1C 7.8 (H) 09/09/2023   HGBA1C 6.6 (H) 03/01/2023   HGBA1C 7.7 (H) 11/28/2022   HGBA1C 6.6 08/20/2022   HGBA1C 5.8 (H) 01/29/2022  She is on renal protective medications, seeing nephrology, on a statin, is due for diabetic foot exam and all blood work today  Hypertension:  Currently managed on Norvasc 10 mg, carvedilol 12.5 mg twice daily, milligrams 3 times daily, losartan 100 mg Pt reports  med compliance and denies any SE.   Blood pressure today is  controlled. BP Readings from Last 3 Encounters:  01/17/24 (!) 160/90  09/09/23 124/74  06/03/23 138/76   Pt denies CP, SOB, exertional sx, LE edema, palpitation, Ha's, visual disturbances, lightheadedness, hypotension, syncope. Dietary efforts for BP?     Hyperlipidemia: Currently treated with atorvastatin 80 mg and Zetia, pt reports good med compliance Last Lipids: Recently done and reviewed, LDL controlled Lab Results  Component Value Date   CHOL 117 09/09/2023   HDL 48 (L) 09/09/2023   LDLCALC 51 09/09/2023   TRIG 93 09/09/2023   CHOLHDL 2.4 09/09/2023   Vasomotor symptoms of menopause she did have improvement in her symptoms with clonidine patch and effexor  Neuropathy she is managed with gabapentin  CKD stage 4 managed by [facility] nephrology  Reviewed last OV and notes through care everywhere Stage IV with  secondary hyperparathyroidism and anemia of chronic disease Most recent labs from nephrology were done December 09, 2023 and reviewed today, EGFR is 26, PTH 194.1 Vitamin D 40.6 albumin/creat ratio 251.8      Current Outpatient Medications:    amLODipine (NORVASC) 10 MG tablet, Take 1 tablet (10 mg total) by mouth daily., Disp: 90 tablet, Rfl: 1   atorvastatin (LIPITOR) 80 MG tablet, Take 1 tablet (80 mg total) by mouth at bedtime., Disp: 90 tablet, Rfl: 1   Blood Glucose Monitoring Suppl DEVI, 1 each by Does not apply route in the morning, at noon, and at bedtime. E11.65, Z79.4 Check BG TID May substitute to any manufacturer covered by patient's insurance., Disp: 1 each, Rfl: 0   carvedilol (COREG) 12.5 MG tablet, Take 1 tablet (12.5 mg total) by mouth 2 (two) times daily with a meal., Disp: 180 tablet, Rfl: 1   cloNIDine (CATAPRES - DOSED IN MG/24 HR) 0.1 mg/24hr patch, Place 1 patch (0.1 mg total) onto the skin once a week., Disp: 4 patch, Rfl: 8   Continuous Glucose Sensor (FREESTYLE LIBRE 3 SENSOR) MISC, APPLY 1 SENSOR EVERY 14 DAYS, Disp: 9 each, Rfl: 2   cyclobenzaprine (FLEXERIL) 5 MG tablet, TAKE 1 TABLET BY MOUTH THREE TIMES A DAY AS NEEDED FOR MUSCLE SPASMS, Disp: 30 tablet, Rfl: 0   ezetimibe (ZETIA) 10 MG tablet, Take 1 tablet (10 mg total) by mouth daily., Disp: 90 tablet, Rfl: 3   ferrous sulfate 325 (  65 FE) MG tablet, Take 325 mg by mouth daily with breakfast., Disp: , Rfl:    Gabapentin (NEURAPTINE) 10 % CREA, Apply 1 Application topically daily as needed (pain to affected area)., Disp: 1 g, Rfl: 5   gabapentin (NEURONTIN) 300 MG capsule, Take 1 capsule (300 mg total) by mouth 2 (two) times daily., Disp: 180 capsule, Rfl: 2   Glucose Blood (BLOOD GLUCOSE TEST STRIPS) STRP, 1 each by In Vitro route in the morning, at noon, and at bedtime. E11.65, Z79.4 Check BG TID May substitute to any manufacturer covered by patient's insurance., Disp: 100 strip, Rfl: 3   hydrALAZINE  (APRESOLINE) 100 MG tablet, Take 1 tablet (100 mg total) by mouth 3 (three) times daily., Disp: 270 tablet, Rfl: 1   insulin glargine (LANTUS SOLOSTAR) 100 UNIT/ML Solostar Pen, Inject 60 Units into the skin at bedtime., Disp: 45 mL, Rfl: 0   Insulin Pen Needle (BD PEN NEEDLE NANO U/F) 32G X 4 MM MISC, USE AS DIRECTED WITH INSULIN  DAILY, Disp: 90 each, Rfl: 2   JARDIANCE 10 MG TABS tablet, TAKE 1 TABLET BY MOUTH EVERY DAY, Disp: 90 tablet, Rfl: 0   Lancets Misc. MISC, 1 each by Does not apply route in the morning, at noon, and at bedtime. E11.65, Z79.4 Check BG TID May substitute to any manufacturer covered by patient's insurance., Disp: 100 each, Rfl: 5   lidocaine (LIDODERM) 5 %, Place 1 patch onto the skin daily. Remove & Discard patch  within 24 h or as directed by MD, Disp: 30 patch, Rfl: 0   losartan (COZAAR) 100 MG tablet, Take 1 tablet (100 mg total) by mouth daily., Disp: 90 tablet, Rfl: 1   Multiple Vitamin (MULTIVITAMIN) tablet, Take 1 tablet by mouth daily., Disp: , Rfl:    nystatin cream (MYCOSTATIN), Apply 1 Application topically 2 (two) times daily., Disp: 30 g, Rfl: 0   Semaglutide, 2 MG/DOSE, (OZEMPIC, 2 MG/DOSE,) 8 MG/3ML SOPN, INJECT 2 MG INTO THE SKIN ONCE A WEEK., Disp: 9 mL, Rfl: 0   venlafaxine XR (EFFEXOR-XR) 75 MG 24 hr capsule, TAKE 1 CAPSULE BY MOUTH DAILY WITH BREAKFAST., Disp: 90 capsule, Rfl: 0  Patient Active Problem List   Diagnosis Date Noted   Elevated sedimentation rate 08/01/2021   Rheumatoid factor positive 08/01/2021   Bilateral wrist pain 08/01/2021   Chronic kidney disease, stage IV (severe) (HCC) 04/25/2021   Secondary hyperparathyroidism of renal origin (HCC) 10/27/2020   Mild intermittent asthma without complication 10/27/2020   Hot flashes due to menopause 07/08/2020   Osteoarthritis of knee 07/08/2020   Pain in joint of left shoulder 07/08/2020   Lumbar radiculopathy 11/10/2019   Type 2 diabetes mellitus with hyperglycemia, with long-term current  use of insulin (HCC) 09/22/2019   Anemia in chronic kidney disease 08/31/2019   Benign hypertensive kidney disease with chronic kidney disease 08/31/2019   Proteinuria 08/31/2019   Type 2 diabetes mellitus with diabetic chronic kidney disease (HCC) 08/31/2019   Hyperlipidemia associated with type 2 diabetes mellitus (HCC) 08/10/2019   Class 2 severe obesity with serious comorbidity and body mass index (BMI) of 35.0 to 35.9 in adult (HCC) 09/22/2018   Diabetic retinopathy of both eyes associated with type 2 diabetes mellitus (HCC) 04/11/2018   Essential hypertension 03/07/2015   DM type 2 with diabetic peripheral neuropathy (HCC) 05/26/2014    Past Surgical History:  Procedure Laterality Date   BREAST REDUCTION SURGERY  2001   CATARACT EXTRACTION W/PHACO Left 04/03/2023   Procedure: CATARACT  EXTRACTION PHACO AND INTRAOCULAR LENS PLACEMENT (IOC) LEFT DIABETIC;  Surgeon: Lockie Mola, MD;  Location: [facility];  Service: Ophthalmology;  Laterality: Left;  2.57 0:21.7   CESAREAN SECTION     COLONOSCOPY WITH PROPOFOL N/A 08/15/2018   Procedure: COLONOSCOPY WITH PROPOFOL;  Surgeon: Wyline Mood, MD;  Location: [facility] ENDOSCOPY;  Service: Gastroenterology;  Laterality: N/A;   ESOPHAGOGASTRODUODENOSCOPY (EGD) WITH PROPOFOL N/A 08/15/2018   Procedure: ESOPHAGOGASTRODUODENOSCOPY (EGD) WITH PROPOFOL;  Surgeon: Wyline Mood, MD;  Location: [facility] ENDOSCOPY;  Service: Gastroenterology;  Laterality: N/A;   GIVENS CAPSULE STUDY N/A 09/08/2018   Procedure: GIVENS CAPSULE STUDY;  Surgeon: Wyline Mood, MD;  Location: [facility] ENDOSCOPY;  Service: Gastroenterology;  Laterality: N/A;   REDUCTION MAMMAPLASTY Bilateral 2000   SHOULDER SURGERY Left    TUBAL LIGATION      Family History  Problem Relation Age of Onset   Diabetes Father    Hyperlipidemia Father    Hypertension Father    Cancer Maternal Aunt 45       leukemia - died   Diabetes Maternal Grandmother    Cancer Maternal Grandmother         breast cancer   Stroke Maternal Grandmother    Breast cancer Maternal Grandmother    Diabetes Daughter        Pre-diabetes   Diabetes Maternal Grandfather    Hyperlipidemia Maternal Grandfather    Hypertension Maternal Grandfather    Cancer Maternal Aunt 50       breast cancer - died   Ovarian cancer Neg Hx    Colon cancer Neg Hx     Social History   Tobacco Use   Smoking status: Former    Current packs/day: 0.00    Types: Cigarettes    Quit date: 2001    Years since quitting: 24.2   Smokeless tobacco: Never   Tobacco comments:    Smoked for 1 year.  2001  Vaping Use   Vaping status: Never Used  Substance Use Topics   Alcohol use: Yes    Alcohol/week: 0.0 standard drinks of alcohol    Comment: occ   Drug use: No     Allergies  Allergen Reactions   Hydrocodone Itching    Health Maintenance  Topic Date Due   FOOT EXAM  02/29/2024   HEMOGLOBIN A1C  03/08/2024   OPHTHALMOLOGY EXAM  03/10/2024   MAMMOGRAM  04/22/2024   DTaP/Tdap/Td (2 - Tdap) 05/26/2024   Cervical Cancer Screening (HPV/Pap Cotest)  08/23/2024   Diabetic kidney evaluation - eGFR measurement  09/08/2024   Diabetic kidney evaluation - Urine ACR  12/08/2024   Colonoscopy  08/15/2028   Pneumococcal Vaccine 62-28 Years old  Completed   INFLUENZA VACCINE  Completed   COVID-19 Vaccine  Completed   Hepatitis C Screening  Completed   HIV Screening  Completed   Zoster Vaccines- Shingrix  Completed   HPV VACCINES  Aged Out    Chart Review Today:  Review of Systems  Constitutional: Negative.   HENT: Negative.    Eyes: Negative.   Respiratory: Negative.    Cardiovascular: Negative.   Gastrointestinal: Negative.   Endocrine: Negative.   Genitourinary: Negative.   Musculoskeletal: Negative.   Skin: Negative.   Allergic/Immunologic: Negative.   Neurological: Negative.   Hematological: Negative.   Psychiatric/Behavioral: Negative.    All other systems reviewed and are negative.    Objective:    Vitals:   01/17/24 0928 01/17/24 1003  BP: (!) 160/90 (!) 140/90  Pulse: 76  Resp: 16   Temp: 97.9 F (36.6 C)   TempSrc: Oral   SpO2: 99%   Weight: 205 lb 9.6 oz (93.3 kg)   Height: 5\' 4"  (1.626 m)     Body mass index is 35.29 kg/m.  Physical Exam Vitals and nursing note reviewed.  Constitutional:      Appearance: She is well-developed. She is obese.  HENT:     Head: Normocephalic and atraumatic.     Nose: Nose normal.  Eyes:     General:        Right eye: No discharge.        Left eye: No discharge.     Conjunctiva/sclera: Conjunctivae normal.  Neck:     Trachea: No tracheal deviation.  Cardiovascular:     Rate and Rhythm: Normal rate and regular rhythm.     Pulses: Normal pulses.     Heart sounds: Normal heart sounds.  Pulmonary:     Effort: Pulmonary effort is normal. No respiratory distress.     Breath sounds: No stridor.  Skin:    General: Skin is warm and dry.     Findings: No rash.  Neurological:     Mental Status: She is alert.     Motor: No abnormal muscle tone.     Coordination: Coordination normal.  Psychiatric:        Behavior: Behavior normal.     Health Maintenance  Topic Date Due   Complete foot exam   02/29/2024   Eye exam for diabetics  03/10/2024   Mammogram  04/22/2024   DTaP/Tdap/Td vaccine (2 - Tdap) 05/26/2024   Hemoglobin A1C  07/19/2024   Pap with HPV screening  08/23/2024   Yearly kidney function blood test for diabetes  09/08/2024   Yearly kidney health urinalysis for diabetes  12/08/2024   Colon Cancer Screening  08/15/2028   Pneumococcal Vaccination  Completed   Flu Shot  Completed   COVID-19 Vaccine  Completed   Hepatitis C Screening  Completed   HIV Screening  Completed   Zoster (Shingles) Vaccine  Completed   HPV Vaccine  Aged Out     Results for orders placed or performed in visit on 01/14/24  Microalbumin / creatinine urine ratio   Collection Time: 12/09/23 12:00 AM  Result Value Ref Range   Microalb Creat  Ratio 251.8       Assessment & Plan:   DM type 2 with diabetic peripheral neuropathy (HCC) Assessment & Plan: Managed with gapapentin, limited by renal dosing  Orders: -     Gabapentin; Take 1 capsule (300 mg total) by mouth 2 (two) times daily.  Dispense: 180 capsule; Refill: 2  Hyperlipidemia associated with type 2 diabetes mellitus (HCC) Assessment & Plan: Lipids recently checked, managed on lipitor 80 and zetia, LDL at goal - 51 Continue monitoring labs annually, watching LFTs and tolerance   Type 2 diabetes mellitus with hyperglycemia, with long-term current use of insulin (HCC) Assessment & Plan: Uncontrolled as of last OV, the visit before I saw the pt and A1C was well controlled but she was having low blood sugar episodes Recheck POC A1C today Lab Results  Component Value Date   HGBA1C 7.8 (H) 09/09/2023   HGBA1C 6.6 (H) 03/01/2023   HGBA1C 7.7 (H) 11/28/2022   HGBA1C 6.6 08/20/2022   HGBA1C 5.8 (H) 01/29/2022  Insulin - basal and mealtime, ozempic, jardiance  Her A1c today was higher, she has not been monitoring her sugars, just put on CGM, we will  have her return and review CGM reports to allow [place] to safely adjust insulins Past higher basal insulin was causing hypoglycemia   Orders: -     POCT glycosylated hemoglobin (Hb A1C) -     Tirzepatide; Inject 5 mg into the skin once a week for 4 doses.  Dispense: 2 mL; Refill: 0 -     Tirzepatide; Inject 7.5 mg into the skin once a week for 4 doses.  Dispense: 2 mL; Refill: 0 -     Tirzepatide; Inject 10 mg into the skin once a week.  Dispense: 6 mL; Refill: 1  Chronic kidney disease, stage IV (severe) (HCC) Assessment & Plan: Monitoring labs and OV with nephrology  Did encourage her to keep HTN and DM well controlled to avoid them worsening renal function Lab Results  Component Value Date   EGFR 27 (L) 09/09/2023   EGFR 26 (L) 06/03/2023   EGFR 22 (L) 03/01/2023   EGFR 25 (L) 11/28/2022   EGFR 26 08/20/2022       Class 2 severe obesity with serious comorbidity and body mass index (BMI) of 35.0 to 35.9 in adult, unspecified obesity type [facility]) Assessment & Plan: Wt Readings from Last 5 Encounters:  01/17/24 205 lb 9.6 oz (93.3 kg)  09/09/23 202 lb 11.2 oz (91.9 kg)  06/03/23 202 lb (91.6 kg)  04/03/23 202 lb (91.6 kg)  03/01/23 207 lb 9.6 oz (94.2 kg)   BMI Readings from Last 5 Encounters:  01/17/24 35.29 kg/m  09/09/23 34.79 kg/m  06/03/23 34.67 kg/m  04/03/23 34.67 kg/m  03/01/23 35.63 kg/m      Hot flashes due to menopause Assessment & Plan: Sx well controlled with clonidine patch and effexor  Orders: -     Venlafaxine HCl ER; Take 1 capsule (75 mg total) by mouth daily with breakfast.  Dispense: 90 capsule; Refill: 3      Return for 3-5 weeks for insulin/sugar recheck.   Danelle Berry, PA-C 01/17/24 9:34 AM

## 2024-01-17 NOTE — Assessment & Plan Note (Addendum)
 Managed with gapapentin, limited by renal dosing

## 2024-01-23 ENCOUNTER — Encounter: Payer: Self-pay | Admitting: Family Medicine

## 2024-01-23 NOTE — Assessment & Plan Note (Signed)
 Wt Readings from Last 5 Encounters:  01/17/24 205 lb 9.6 oz (93.3 kg)  09/09/23 202 lb 11.2 oz (91.9 kg)  06/03/23 202 lb (91.6 kg)  04/03/23 202 lb (91.6 kg)  03/01/23 207 lb 9.6 oz (94.2 kg)   BMI Readings from Last 5 Encounters:  01/17/24 35.29 kg/m  09/09/23 34.79 kg/m  06/03/23 34.67 kg/m  04/03/23 34.67 kg/m  03/01/23 35.63 kg/m

## 2024-01-23 NOTE — Assessment & Plan Note (Addendum)
 Monitoring labs and OV with nephrology  Did encourage her to keep HTN and DM well controlled to avoid them worsening renal function Lab Results  Component Value Date   EGFR 27 (L) 09/09/2023   EGFR 26 (L) 06/03/2023   EGFR 22 (L) 03/01/2023   EGFR 25 (L) 11/28/2022   EGFR 26 08/20/2022

## 2024-01-23 NOTE — Assessment & Plan Note (Signed)
 Sx well controlled with clonidine patch and effexor

## 2024-01-31 ENCOUNTER — Other Ambulatory Visit: Payer: Self-pay | Admitting: Family Medicine

## 2024-01-31 DIAGNOSIS — E1169 Type 2 diabetes mellitus with other specified complication: Secondary | ICD-10-CM

## 2024-01-31 DIAGNOSIS — I10 Essential (primary) hypertension: Secondary | ICD-10-CM

## 2024-01-31 NOTE — Telephone Encounter (Signed)
 Requested Prescriptions  Pending Prescriptions Disp Refills   carvedilol (COREG) 12.5 MG tablet [Pharmacy Med Name: CARVEDILOL 12.5 MG TABLET] 180 tablet 1    Sig: TAKE 1 TABLET (12.5MG  TOTAL) BY MOUTH TWICE A DAY WITH MEALS     Cardiovascular: Beta Blockers 3 Failed - 01/31/2024  2:42 PM      Failed - Cr in normal range and within 360 days    Creat  Date Value Ref Range Status  09/09/2023 2.15 (H) 0.50 - 1.03 mg/dL Final   Creatinine,U  Date Value Ref Range Status  10/20/2014 83.0 mg/dL Final   Creatinine, Urine  Date Value Ref Range Status  03/01/2023 77 20 - 275 mg/dL Final         Failed - Last BP in normal range    BP Readings from Last 1 Encounters:  01/17/24 (!) 140/90         Failed - Valid encounter within last 6 months    Recent Outpatient Visits           2 weeks ago DM type 2 with diabetic peripheral neuropathy [facility])   [place] [facility] Danelle Berry, PA-C   2 weeks ago No-show for appointment   [facility] Danelle Berry, PA-C       Future Appointments             In 3 weeks Danelle Berry, PA-C [place] [facility]            Passed - AST in normal range and within 360 days    AST  Date Value Ref Range Status  09/09/2023 17 10 - 35 U/L Final   SGOT(AST)  Date Value Ref Range Status  02/14/2013 13 (L) 15 - 37 Unit/L Final         Passed - ALT in normal range and within 360 days    ALT  Date Value Ref Range Status  09/09/2023 20 6 - 29 U/L Final   SGPT (ALT)  Date Value Ref Range Status  02/14/2013 18 12 - 78 U/L Final         Passed - Last Heart Rate in normal range    Pulse Readings from Last 1 Encounters:  01/17/24 76          amLODipine (NORVASC) 10 MG tablet [Pharmacy Med Name: AMLODIPINE BESYLATE 10 MG TAB] 90 tablet 1    Sig: TAKE 1 TABLET BY MOUTH EVERY DAY     Cardiovascular: Calcium Channel Blockers 2 Failed - 01/31/2024  2:42 PM      Failed - Last BP in  normal range    BP Readings from Last 1 Encounters:  01/17/24 (!) 140/90         Failed - Valid encounter within last 6 months    Recent Outpatient Visits           2 weeks ago DM type 2 with diabetic peripheral neuropathy [facility])   [place] [facility] Danelle Berry, PA-C   2 weeks ago No-show for appointment   [facility] Danelle Berry, [place]       Future Appointments             In 3 weeks Danelle Berry, PA-C [facility]            Passed - Last Heart Rate in normal range    Pulse Readings from Last 1 Encounters:  01/17/24  76          atorvastatin (LIPITOR) 80 MG tablet [Pharmacy Med Name: ATORVASTATIN 80 MG TABLET] 90 tablet 1    Sig: TAKE 1 TABLET BY MOUTH EVERYDAY AT BEDTIME     Cardiovascular:  Antilipid - Statins Failed - 01/31/2024  2:42 PM      Failed - Valid encounter within last 12 months    Recent Outpatient Visits           2 weeks ago DM type 2 with diabetic peripheral neuropathy [facility])   [place] [facility] Danelle Berry, PA-C   2 weeks ago No-show for appointment   [facility] Danelle Berry, [place]       Future Appointments             In 3 weeks Danelle Berry, PA-C [place] [facility]            Failed - Lipid Panel in normal range within the last 12 months    Cholesterol  Date Value Ref Range Status  09/09/2023 117 <200 mg/dL Final   LDL Cholesterol (Calc)  Date Value Ref Range Status  09/09/2023 51 mg/dL (calc) Final    Comment:    Reference range: <100 . Desirable range <100 mg/dL for primary prevention;   <70 mg/dL for patients with CHD or diabetic patients  with > or = 2 CHD risk factors. Marland Kitchen LDL-C is now calculated using the Martin-Hopkins  calculation, which is a validated novel method providing  better accuracy than the Friedewald equation in the  estimation of LDL-C.  Horald Pollen et al.  Lenox Ahr. 1610;960(45): 2061-2068  (http://education.[facility].com/faq/FAQ164)    HDL  Date Value Ref Range Status  09/09/2023 48 (L) > OR = 50 mg/dL Final   Triglycerides  Date Value Ref Range Status  09/09/2023 93 <150 mg/dL Final         Passed - Patient is not pregnant

## 2024-02-01 ENCOUNTER — Other Ambulatory Visit: Payer: Self-pay | Admitting: Family Medicine

## 2024-02-01 DIAGNOSIS — I10 Essential (primary) hypertension: Secondary | ICD-10-CM

## 2024-02-03 NOTE — Telephone Encounter (Signed)
 Requested Prescriptions  Pending Prescriptions Disp Refills   hydrALAZINE (APRESOLINE) 100 MG tablet [Pharmacy Med Name: HYDRALAZINE 100 MG TABLET] 270 tablet 1    Sig: TAKE 1 TABLET BY MOUTH 3 TIMES DAILY.     Cardiovascular:  Vasodilators Failed - 02/03/2024  1:41 PM      Failed - ANA Screen, Ifa, Serum in normal range and within 360 days    Anti Nuclear Antibody (ANA)  Date Value Ref Range Status  06/15/2021 NEGATIVE NEGATIVE Final    Comment:    ANA IFA is a first line screen for detecting the presence of up to approximately 150 autoantibodies in various autoimmune diseases. A negative ANA IFA result suggests an ANA-associated autoimmune disease is not present at this time, but is not definitive. If there is high clinical suspicion for Sjogren's syndrome, testing for anti-SS-A/Ro antibody should be considered. Anti-Jo-1 antibody should be considered for clinically suspected inflammatory myopathies. . AC-0: Negative . International Consensus on ANA Patterns (SeverTies.uy) . For additional information, please refer to http://education.[facility].com/faq/FAQ177 (This link is being provided for informational/ educational purposes only.) .          Failed - Last BP in normal range    BP Readings from Last 1 Encounters:  01/17/24 (!) 140/90         Failed - Valid encounter within last 12 months    Recent Outpatient Visits           2 weeks ago DM type 2 with diabetic peripheral neuropathy [facility])   [place] [facility] Danelle Berry, PA-C   2 weeks ago No-show for appointment   [facility] Danelle Berry, [place]       Future Appointments             In 2 weeks Danelle Berry, PA-C [place] [facility]            Passed - HCT in normal range and within 360 days    HCT  Date Value Ref Range Status  09/09/2023 39.1 35.0 - 45.0 % Final   Hematocrit  Date Value Ref Range  Status  03/04/2018 30.7 (L) 34.0 - 46.6 % Final         Passed - HGB in normal range and within 360 days    Hemoglobin  Date Value Ref Range Status  09/09/2023 12.7 11.7 - 15.5 g/dL Final  56/21/3086 57.8 (L) 11.1 - 15.9 g/dL Final         Passed - RBC in normal range and within 360 days    RBC  Date Value Ref Range Status  09/09/2023 4.25 3.80 - 5.10 Million/uL Final         Passed - WBC in normal range and within 360 days    WBC  Date Value Ref Range Status  09/09/2023 6.6 3.8 - 10.8 Thousand/uL Final         Passed - PLT in normal range and within 360 days    Platelets  Date Value Ref Range Status  09/09/2023 287 140 - 400 Thousand/uL Final  03/04/2018 427 (H) 150 - 379 x10E3/uL Final

## 2024-02-21 ENCOUNTER — Ambulatory Visit: Admitting: Family Medicine

## 2024-03-20 ENCOUNTER — Other Ambulatory Visit: Payer: Self-pay | Admitting: Family Medicine

## 2024-03-20 ENCOUNTER — Ambulatory Visit: Admitting: Family Medicine

## 2024-03-20 ENCOUNTER — Encounter: Payer: Self-pay | Admitting: Family Medicine

## 2024-03-20 VITALS — BP 130/82 | HR 83 | Resp 16 | Ht 64.0 in | Wt 206.0 lb

## 2024-03-20 DIAGNOSIS — E1142 Type 2 diabetes mellitus with diabetic polyneuropathy: Secondary | ICD-10-CM | POA: Diagnosis not present

## 2024-03-20 DIAGNOSIS — E1165 Type 2 diabetes mellitus with hyperglycemia: Secondary | ICD-10-CM

## 2024-03-20 DIAGNOSIS — Z794 Long term (current) use of insulin: Secondary | ICD-10-CM

## 2024-03-20 MED ORDER — FREESTYLE LIBRE 3 SENSOR MISC
3 refills | Status: AC
Start: 2024-03-20 — End: ?

## 2024-03-20 MED ORDER — TIRZEPATIDE 15 MG/0.5ML ~~LOC~~ SOAJ: 15.0000 mg | SUBCUTANEOUS | 2 refills | Status: DC

## 2024-03-20 MED ORDER — TIRZEPATIDE 12.5 MG/0.5ML ~~LOC~~ SOAJ: 12.5000 mg | SUBCUTANEOUS | 0 refills | Status: DC

## 2024-03-20 NOTE — Patient Instructions (Addendum)
 Come by for your A1c on June 21 or after

## 2024-03-20 NOTE — Progress Notes (Signed)
 Name: Janice Jennings   MRN: [id number]    DOB: [birth year]   Date:03/20/2024       Progress Note  Chief Complaint  Patient presents with   Medical Management of Chronic Issues     Subjective:   Janice Jennings is a 53 y.o. female, presents to clinic for routine follow up on chronic conditions  DM f/up changed ozempic  to [facility] in March 2 months ago, on lantus  50 units and jardiance  10 She said only a few low blood sugar readings when she did not eat dinner [email or web address] for libre 3 meter acct request access - needs refills       Current Outpatient Medications:    amLODipine  (NORVASC ) 10 MG tablet, TAKE 1 TABLET BY MOUTH EVERY DAY, Disp: 90 tablet, Rfl: 1   atorvastatin  (LIPITOR ) 80 MG tablet, TAKE 1 TABLET BY MOUTH EVERYDAY AT BEDTIME, Disp: 90 tablet, Rfl: 1   Blood Glucose Monitoring Suppl DEVI, 1 each by Does not apply route in the morning, at noon, and at bedtime. E11.65, Z79.4 Check BG TID May substitute to any manufacturer covered by patient's insurance., Disp: 1 each, Rfl: 0   carvedilol  (COREG ) 12.5 MG tablet, TAKE 1 TABLET (12.5MG  TOTAL) BY MOUTH TWICE A DAY WITH MEALS, Disp: 180 tablet, Rfl: 1   cloNIDine  (CATAPRES  - DOSED IN MG/24 HR) 0.1 mg/24hr patch, Place 1 patch (0.1 mg total) onto the skin once a week., Disp: 4 patch, Rfl: 8   Continuous Glucose Sensor (FREESTYLE LIBRE 3 SENSOR) MISC, APPLY 1 SENSOR EVERY 14 DAYS, Disp: 9 each, Rfl: 2   cyclobenzaprine  (FLEXERIL ) 5 MG tablet, TAKE 1 TABLET BY MOUTH THREE TIMES A DAY AS NEEDED FOR MUSCLE SPASMS, Disp: 30 tablet, Rfl: 0   ezetimibe  (ZETIA ) 10 MG tablet, Take 1 tablet (10 mg total) by mouth daily., Disp: 90 tablet, Rfl: 3   ferrous sulfate 325 (65 FE) MG tablet, Take 325 mg by mouth daily with breakfast., Disp: , Rfl:    gabapentin  (NEURONTIN ) 300 MG capsule, Take 1 capsule (300 mg total) by mouth 2 (two) times daily., Disp: 180 capsule, Rfl: 2   Glucose Blood (BLOOD GLUCOSE TEST STRIPS) STRP, 1 each by  In Vitro route in the morning, at noon, and at bedtime. E11.65, Z79.4 Check BG TID May substitute to any manufacturer covered by patient's insurance., Disp: 100 strip, Rfl: 3   hydrALAZINE  (APRESOLINE ) 100 MG tablet, TAKE 1 TABLET BY MOUTH 3 TIMES DAILY., Disp: 270 tablet, Rfl: 1   insulin  glargine (LANTUS  SOLOSTAR) 100 UNIT/ML Solostar Pen, Inject 60 Units into the skin at bedtime., Disp: 45 mL, Rfl: 0   Insulin  Pen Needle (BD PEN NEEDLE NANO U/F) 32G X 4 MM MISC, USE AS DIRECTED WITH INSULIN   DAILY, Disp: 90 each, Rfl: 2   JARDIANCE  10 MG TABS tablet, TAKE 1 TABLET BY MOUTH EVERY DAY, Disp: 90 tablet, Rfl: 0   Lancets Misc. MISC, 1 each by Does not apply route in the morning, at noon, and at bedtime. E11.65, Z79.4 Check BG TID May substitute to any manufacturer covered by patient's insurance., Disp: 100 each, Rfl: 5   lidocaine  (LIDODERM ) 5 %, Place 1 patch onto the skin daily. Remove & Discard patch  within 24 h or as directed by MD, Disp: 30 patch, Rfl: 0   losartan  (COZAAR ) 100 MG tablet, Take 1 tablet (100 mg total) by mouth daily., Disp: 90 tablet, Rfl: 1   Multiple Vitamin (MULTIVITAMIN) tablet, Take 1 tablet by mouth daily., Disp: ,  Rfl:    nystatin  cream (MYCOSTATIN ), Apply 1 Application topically 2 (two) times daily., Disp: 30 g, Rfl: 0   tirzepatide (MOUNJARO) 10 MG/0.5ML Pen, Inject 10 mg into the skin once a week., Disp: 6 mL, Rfl: 1   venlafaxine  XR (EFFEXOR -XR) 75 MG 24 hr capsule, Take 1 capsule (75 mg total) by mouth daily with breakfast., Disp: 90 capsule, Rfl: 3  Patient Active Problem List   Diagnosis Date Noted   Elevated sedimentation rate 08/01/2021   Rheumatoid factor positive 08/01/2021   Bilateral wrist pain 08/01/2021   Chronic kidney disease, stage IV (severe) (HCC) 04/25/2021   Secondary hyperparathyroidism of renal origin (HCC) 10/27/2020   Mild intermittent asthma without complication 10/27/2020   Hot flashes due to menopause 07/08/2020   Osteoarthritis of knee  07/08/2020   Pain in joint of left shoulder 07/08/2020   Lumbar radiculopathy 11/10/2019   Type 2 diabetes mellitus with hyperglycemia, with long-term current use of insulin  (HCC) 09/22/2019   Anemia in chronic kidney disease 08/31/2019   Benign hypertensive kidney disease with chronic kidney disease 08/31/2019   Proteinuria 08/31/2019   Type 2 diabetes mellitus with diabetic chronic kidney disease (HCC) 08/31/2019   Hyperlipidemia associated with type 2 diabetes mellitus (HCC) 08/10/2019   Class 2 severe obesity with serious comorbidity and body mass index (BMI) of 35.0 to 35.9 in adult (HCC) 09/22/2018   Diabetic retinopathy of both eyes associated with type 2 diabetes mellitus (HCC) 04/11/2018   Essential hypertension 03/07/2015   DM type 2 with diabetic peripheral neuropathy (HCC) 05/26/2014    Past Surgical History:  Procedure Laterality Date   BREAST REDUCTION SURGERY  2001   CATARACT EXTRACTION W/PHACO Left 04/03/2023   Procedure: CATARACT EXTRACTION PHACO AND INTRAOCULAR LENS PLACEMENT (IOC) LEFT DIABETIC;  Surgeon: Annell Kidney, MD;  Location[facility];  Service: Ophthalmology;  Laterality: Left;  2.57 0:21.7   CESAREAN SECTION     COLONOSCOPY WITH PROPOFOL  N/A 08/15/2018   Procedure: COLONOSCOPY WITH PROPOFOL ;  Surgeon: Luke Salaam, MD;  Location: [facility] END[facility];  Service: Gastroenterology;  Laterality: N/A;   ESOPHAGOGASTRODUODENOSCOPY (EGD) WITH PROPOFOL  N/A 08/15/2018   Procedure: ESOPHAGOGASTRODUODENOSCOPY (EGD) WITH PROPOFOL ;  Surgeon: Luke Salaam, MD;  Location: [facility] ENDOSCOPY;  Service: Gastroenterology;  Laterality: N/A;   GIVENS CAPSULE STUDY N/A 09/08/2018   Procedure: GIVENS CAPSULE STUDY;  Surgeon: Luke Salaam, MD;  Location: [facility] ENDOSCOPY;  Service: Gastroenterology;  Laterality: N/A;   REDUCTION MAMMAPLASTY Bilateral 2000   SHOULDER SURGERY Left    TUBAL LIGATION      Family History  Problem Relation Age of Onset   Diabetes Father     Hyperlipidemia Father    Hypertension Father    Cancer Maternal Aunt 85       leukemia - died   Diabetes Maternal Grandmother    Cancer Maternal Grandmother        breast cancer   Stroke Maternal Grandmother    Breast cancer Maternal Grandmother    Diabetes Daughter        Pre-diabetes   Diabetes Maternal Grandfather    Hyperlipidemia Maternal Grandfather    Hypertension Maternal Grandfather    Cancer Maternal Aunt 50       breast cancer - died   Ovarian cancer Neg Hx    Colon cancer Neg Hx     Social History   Tobacco Use   Smoking status: Former    Current packs/day: 0.00    Types: Cigarettes    Quit date: 2001  Years since quitting: 24.4   Smokeless tobacco: Never   Tobacco comments:    Smoked for 1 year.  2001  Vaping Use   Vaping status: Never Used  Substance Use Topics   Alcohol use: Yes    Alcohol/week: 0.0 standard drinks of alcohol    Comment: occ   Drug use: No     Allergies  Allergen Reactions   Hydrocodone  Itching    Health Maintenance  Topic Date Due   FOOT EXAM  02/29/2024   OPHTHALMOLOGY EXAM  03/20/2024 (Originally 03/10/2024)   MAMMOGRAM  04/22/2024   DTaP/Tdap/Td (2 - Tdap) 05/26/2024   INFLUENZA VACCINE  05/29/2024   HEMOGLOBIN A1C  07/19/2024   Cervical Cancer Screening (HPV/Pap Cotest)  08/23/2024   Diabetic kidney evaluation - eGFR measurement  09/08/2024   Diabetic kidney evaluation - Urine ACR  12/08/2024   Colonoscopy  08/15/2028   Pneumococcal Vaccine 93-80 Years old  Completed   COVID-19 Vaccine  Completed   Hepatitis C Screening  Completed   HIV Screening  Completed   Zoster Vaccines- Shingrix  Completed   HPV VACCINES  Aged Out   Meningococcal B Vaccine  Aged Out    Chart Review Today: I personally reviewed active problem list, medication list, allergies, family history, social history, health maintenance, notes from last encounter, lab results, imaging with the patient/caregiver today.   Review of Systems   Constitutional: Negative.   HENT: Negative.    Eyes: Negative.   Respiratory: Negative.    Cardiovascular: Negative.   Gastrointestinal: Negative.   Endocrine: Negative.   Genitourinary: Negative.   Musculoskeletal: Negative.   Skin: Negative.   Allergic/Immunologic: Negative.   Neurological: Negative.   Hematological: Negative.   Psychiatric/Behavioral: Negative.    All other systems reviewed and are negative.    Objective:   Vitals:   03/20/24 0845  BP: 130/82  Pulse: 83  Resp: 16  SpO2: 98%  Weight: 206 lb (93.4 kg)  Height: 5\' 4"  (1.626 m)    Body mass index is 35.36 kg/m.  Physical Exam Vitals and nursing note reviewed.  Constitutional:      General: She is not in acute distress.    Appearance: Normal appearance. She is well-developed. She is obese. She is not ill-appearing, toxic-appearing or diaphoretic.  HENT:     Head: Normocephalic and atraumatic.     Nose: Nose normal.  Eyes:     General:        Right eye: No discharge.        Left eye: No discharge.     Conjunctiva/sclera: Conjunctivae normal.  Neck:     Trachea: No tracheal deviation.  Cardiovascular:     Rate and Rhythm: Normal rate and regular rhythm.     Pulses: Normal pulses.     Heart sounds: Normal heart sounds.  Pulmonary:     Effort: Pulmonary effort is normal. No respiratory distress.     Breath sounds: Normal breath sounds. No stridor.  Skin:    General: Skin is warm and dry.     Findings: No rash.  Neurological:     Mental Status: She is alert.     Motor: No abnormal muscle tone.     Coordination: Coordination normal.  Psychiatric:        Behavior: Behavior normal.     Diabetic Foot Exam - Simple   Simple Foot Form Diabetic Foot exam was performed with the following findings: Yes 03/20/2024  9:10 AM  Visual Inspection No deformities,  no ulcerations, no other skin breakdown bilaterally: Yes Sensation Testing Intact to touch and monofilament testing bilaterally: Yes Pulse  Check Posterior Tibialis and Dorsalis pulse intact bilaterally: Yes Comments      Functional Status Survey:   Results for orders placed or performed in visit on 01/17/24  POCT glycosylated hemoglobin (Hb A1C)   Collection Time: 01/17/24  9:46 AM  Result Value Ref Range   Hemoglobin A1C 7.7 (A) 4.0 - 5.6 %      Assessment & Plan:   DM type 2 with diabetic peripheral neuropathy [facility]) Assessment & Plan: She switched from ozempic  to [facility] in March, on 10 mg dose w/o any side effects We will increase to 12.5 and 15 mg doses  She will watch the CGM and adjust lantus  down as needed based on fasting sugar levels Continue jardiance  Not due to A1c yet so we will f/up in 3 months   Lab Results  Component Value Date   HGBA1C 7.7 (A) 01/17/2024   HGBA1C 7.8 (H) 09/09/2023   HGBA1C 6.6 (H) 03/01/2023   HGBA1C 7.7 (H) 11/28/2022   HGBA1C 6.6 08/20/2022     Orders: -     HM Diabetes Foot Exam -     Hemoglobin A1c; Future -     Microalbumin / creatinine urine ratio; Future -     Comprehensive metabolic panel with GFR; Future  Type 2 diabetes mellitus with hyperglycemia, with long-term current use of insulin  (HCC) -     Hemoglobin A1c; Future -     Microalbumin / creatinine urine ratio; Future -     Comprehensive metabolic panel with GFR; Future -     FreeStyle Libre 3 Sensor; APPLY 1 SENSOR EVERY 14 DAYS  Dispense: 6 each; Refill: 3  Other orders -     Tirzepatide; Inject 12.5 mg into the skin once a week.  Dispense: 6 mL; Refill: 0 -     Tirzepatide; Inject 15 mg into the skin once a week.  Dispense: 6 mL; Refill: 2   She will return for A1c labs when due x 3 months June 21st then next OV appt will be 3 months after that  Return for end of sept /beginning of Oct DM/HLD f/up in office with labs due.   Adeline Hone, PA-C 03/20/24 9:01 AM

## 2024-03-20 NOTE — Assessment & Plan Note (Signed)
 She switched from ozempic  to [facility] in March, on 10 mg dose w/o any side effects We will increase to 12.5 and 15 mg doses  She will watch the CGM and adjust lantus  down as needed based on fasting sugar levels Continue jardiance  Not due to A1c yet so we will f/up in 3 months   Lab Results  Component Value Date   HGBA1C 7.7 (A) 01/17/2024   HGBA1C 7.8 (H) 09/09/2023   HGBA1C 6.6 (H) 03/01/2023   HGBA1C 7.7 (H) 11/28/2022   HGBA1C 6.6 08/20/2022

## 2024-03-24 ENCOUNTER — Other Ambulatory Visit (HOSPITAL_COMMUNITY): Payer: Self-pay

## 2024-03-24 ENCOUNTER — Telehealth: Payer: Self-pay | Admitting: Pharmacy Technician

## 2024-03-24 NOTE — Telephone Encounter (Signed)
 Pharmacy Patient Advocate Encounter  Received notification from [facility] that Prior Authorization for Mounjaro 12.5MG /0.5ML auto-injectors  has been APPROVED from 03/24/24 to 03/25/27. Unable to obtain price due to refill too soon rejection, last fill date 03/24/24 next available fill date06/21/25   PA #/Case ID/Reference #: [id number]

## 2024-03-24 NOTE — Telephone Encounter (Signed)
 Requested Prescriptions  Pending Prescriptions Disp Refills   insulin  glargine (LANTUS  SOLOSTAR) 100 UNIT/ML Solostar Pen [Pharmacy Med Name: LANTUS  SOLOSTAR 100 UNIT/ML] 45 mL 1    Sig: INJECT 60 UNITS INTO THE SKIN AT BEDTIME.     Endocrinology:  Diabetes - Insulins Passed - 03/24/2024  8:08 AM      Passed - HBA1C is between 0 and 7.9 and within 180 days    Hemoglobin A1C  Date Value Ref Range Status  01/17/2024 7.7 (A) 4.0 - 5.6 % Final  08/20/2022 6.6  Final   Hgb A1c MFr Bld  Date Value Ref Range Status  09/09/2023 7.8 (H) <5.7 % of total Hgb Final    Comment:    For someone without known diabetes, a hemoglobin A1c value of 6.5% or greater indicates that they may have  diabetes and this should be confirmed with a follow-up  test. . For someone with known diabetes, a value <7% indicates  that their diabetes is well controlled and a value  greater than or equal to 7% indicates suboptimal  control. A1c targets should be individualized based on  duration of diabetes, age, comorbid conditions, and  other considerations. . Currently, no consensus exists regarding use of hemoglobin A1c for diagnosis of diabetes for children. Temple Feeler - Valid encounter within last 6 months    Recent Outpatient Visits           4 days ago DM type 2 with diabetic peripheral neuropathy [facility])   [place] [facility] Adeline Hone, PA-C   2 months ago DM type 2 with diabetic peripheral neuropathy [facility])   [place] [facility] Adeline Hone, PA-C   2 months ago No-show for appointment   [facility] Adeline Hone, PA-C

## 2024-03-24 NOTE — Telephone Encounter (Signed)
 Pharmacy Patient Advocate Encounter   Received notification from CoverMyMeds that prior authorization for Mounjaro 12.5MG /0.5ML auto-injectors is required/requested.   Insurance verification completed.   The patient is insured through [facility] .   Per test claim: PA required; PA submitted to above mentioned insurance via CoverMyMeds Key/confirmation #/EOC [id number] Status is pending

## 2024-03-27 ENCOUNTER — Other Ambulatory Visit (HOSPITAL_COMMUNITY): Payer: Self-pay

## 2024-04-10 ENCOUNTER — Other Ambulatory Visit: Payer: Self-pay | Admitting: Nurse Practitioner

## 2024-04-10 DIAGNOSIS — I10 Essential (primary) hypertension: Secondary | ICD-10-CM

## 2024-04-12 ENCOUNTER — Encounter: Payer: Self-pay | Admitting: Family Medicine

## 2024-04-13 ENCOUNTER — Other Ambulatory Visit: Payer: Self-pay

## 2024-04-13 DIAGNOSIS — E1142 Type 2 diabetes mellitus with diabetic polyneuropathy: Secondary | ICD-10-CM

## 2024-04-13 MED ORDER — FREESTYLE LIBRE 3 PLUS SENSOR MISC: 2 refills | Status: DC

## 2024-04-13 NOTE — Telephone Encounter (Signed)
 Requested Prescriptions  Pending Prescriptions Disp Refills   losartan  (COZAAR ) 100 MG tablet [Pharmacy Med Name: LOSARTAN  POTASSIUM 100 MG TAB] 90 tablet 1    Sig: TAKE 1 TABLET BY MOUTH EVERY DAY     Cardiovascular:  Angiotensin Receptor Blockers Failed - 04/13/2024 11:16 AM      Failed - Cr in normal range and within 180 days    Creat  Date Value Ref Range Status  09/09/2023 2.15 (H) 0.50 - 1.03 mg/dL Final   Creatinine,U  Date Value Ref Range Status  10/20/2014 83.0 mg/dL Final   Creatinine, Urine  Date Value Ref Range Status  03/01/2023 77 20 - 275 mg/dL Final         Failed - K in normal range and within 180 days    Potassium  Date Value Ref Range Status  09/09/2023 4.4 3.5 - 5.3 mmol/L Final  02/14/2013 4.1 3.5 - 5.1 mmol/L Final         Passed - Patient is not pregnant      Passed - Last BP in normal range    BP Readings from Last 1 Encounters:  03/20/24 130/82         Passed - Valid encounter within last 6 months    Recent Outpatient Visits           3 weeks ago DM type 2 with diabetic peripheral neuropathy [facility])   [place] [facility] Adeline Hone, PA-C   2 months ago DM type 2 with diabetic peripheral neuropathy [facility])   [place] [facility] Adeline Hone, PA-C   3 months ago No-show for appointment   [facility] Adeline Hone, PA-C

## 2024-05-15 ENCOUNTER — Encounter: Payer: Self-pay | Admitting: Family Medicine

## 2024-05-20 ENCOUNTER — Encounter: Payer: Self-pay | Admitting: Family Medicine

## 2024-05-26 ENCOUNTER — Ambulatory Visit: Admitting: Nurse Practitioner

## 2024-05-28 ENCOUNTER — Encounter: Payer: Self-pay | Admitting: Family Medicine

## 2024-05-29 ENCOUNTER — Telehealth: Payer: Self-pay | Admitting: Pharmacy Technician

## 2024-05-29 ENCOUNTER — Other Ambulatory Visit: Payer: Self-pay

## 2024-05-29 ENCOUNTER — Other Ambulatory Visit (HOSPITAL_COMMUNITY): Payer: Self-pay

## 2024-05-29 ENCOUNTER — Other Ambulatory Visit (HOSPITAL_BASED_OUTPATIENT_CLINIC_OR_DEPARTMENT_OTHER): Payer: Self-pay

## 2024-05-29 NOTE — Telephone Encounter (Signed)
 PA request has been Submitted. New Encounter has been or will be created for follow up. For additional info see Pharmacy Prior Auth telephone encounter from 05/29/24.

## 2024-05-29 NOTE — Telephone Encounter (Signed)
 Pharmacy Patient Advocate Encounter  Received notification from [facility] that Prior Authorization for Dexcom G7 Sensor has been APPROVED from 05/29/24 to 05/29/25. Ran test claim, Copay is $30.00. This test claim was processed through [facility]- copay amounts may vary at other pharmacies due to pharmacy/plan contracts, or as the patient moves through the different stages of their insurance plan.   PA #/Case ID/Reference #: [id number]

## 2024-05-29 NOTE — Telephone Encounter (Signed)
 Pharmacy Patient Advocate Encounter   Received notification from Patient Advice Request messages that prior authorization for Dexcom G7 Sensor is required/requested.   Insurance verification completed.   The patient is insured through [facility] .   Per test claim: PA required; PA submitted to above mentioned insurance via CoverMyMeds Key/confirmation #/EOC [facility] Status is pending

## 2024-06-16 ENCOUNTER — Ambulatory Visit: Admitting: Family Medicine

## 2024-06-16 LAB — COMPREHENSIVE METABOLIC PANEL WITH GFR
Calcium: 10.3 (ref 8.7–10.7)
eGFR: 27

## 2024-06-16 LAB — HEMOGLOBIN A1C: Hemoglobin A1C: 5.7

## 2024-06-16 LAB — BASIC METABOLIC PANEL WITH GFR
BUN: 29 — AB (ref 4–21)
CO2: 27 — AB (ref 13–22)
Chloride: 109 — AB (ref 99–108)
Creatinine: 2.2 — AB (ref 0.5–1.1)
Glucose: 129
Potassium: 5.1 meq/L (ref 3.5–5.1)
Sodium: 144 (ref 137–147)

## 2024-06-16 LAB — PROTEIN / CREATININE RATIO, URINE
Albumin, U: 6.3
Creatinine, Urine: 52.4

## 2024-06-16 LAB — MICROALBUMIN / CREATININE URINE RATIO: Microalb Creat Ratio: 120.2

## 2024-06-22 ENCOUNTER — Other Ambulatory Visit: Payer: Self-pay | Admitting: Family Medicine

## 2024-06-22 ENCOUNTER — Other Ambulatory Visit: Payer: Self-pay | Admitting: Nurse Practitioner

## 2024-06-22 DIAGNOSIS — M5416 Radiculopathy, lumbar region: Secondary | ICD-10-CM

## 2024-06-22 DIAGNOSIS — E1165 Type 2 diabetes mellitus with hyperglycemia: Secondary | ICD-10-CM

## 2024-06-23 NOTE — Telephone Encounter (Signed)
 Labs in date  Requested Prescriptions  Pending Prescriptions Disp Refills   JARDIANCE  10 MG TABS tablet [Pharmacy Med Name: JARDIANCE  10 MG TABLET] 90 tablet 0    Sig: TAKE 1 TABLET BY MOUTH EVERY DAY     Endocrinology:  Diabetes - SGLT2 Inhibitors Failed - 06/23/2024  3:22 PM      Failed - Cr in normal range and within 360 days    Creat  Date Value Ref Range Status  09/09/2023 2.15 (H) 0.50 - 1.03 mg/dL Final   Creatinine, Urine  Date Value Ref Range Status  03/01/2023 77 20 - 275 mg/dL Final         Failed - eGFR in normal range and within 360 days    GFR, Est African American  Date Value Ref Range Status  03/14/2021 30 (L) > OR = 60 mL/min/1.38m2 Final   GFR, Est Non African American  Date Value Ref Range Status  03/14/2021 26 (L) > OR = 60 mL/min/1.42m2 Final   GFR  Date Value Ref Range Status  06/25/2016 71.37 >60.00 mL/min Final   eGFR  Date Value Ref Range Status  09/09/2023 27 (L) > OR = 60 mL/min/1.54m2 Final         Passed - HBA1C is between 0 and 7.9 and within 180 days    Hemoglobin A1C  Date Value Ref Range Status  01/17/2024 7.7 (A) 4.0 - 5.6 % Final  08/20/2022 6.6  Final   Hgb A1c MFr Bld  Date Value Ref Range Status  09/09/2023 7.8 (H) <5.7 % of total Hgb Final    Comment:    For someone without known diabetes, a hemoglobin A1c value of 6.5% or greater indicates that they may have  diabetes and this should be confirmed with a follow-up  test. . For someone with known diabetes, a value <7% indicates  that their diabetes is well controlled and a value  greater than or equal to 7% indicates suboptimal  control. A1c targets should be individualized based on  duration of diabetes, age, comorbid conditions, and  other considerations. . Currently, no consensus exists regarding use of hemoglobin A1c for diagnosis of diabetes for children. SABRA Amy - Valid encounter within last 6 months    Recent Outpatient Visits           3 months  ago DM type 2 with diabetic peripheral neuropathy [facility])   [place] [facility] Leavy Mole, PA-C   5 months ago DM type 2 with diabetic peripheral neuropathy [facility])   [place] [facility] Leavy Mole, PA-C   5 months ago No-show for appointment   [place] [facility] Leavy Mole, PA-C

## 2024-06-23 NOTE — Telephone Encounter (Signed)
 Requested medication (s) are due for refill today: yes  Requested medication (s) are on the active medication list: yes  Last refill:  12/27/23  Future visit scheduled: {Yes  Notes to clinic:  Unable to refill per protocol, cannot delegate.      Requested Prescriptions  Pending Prescriptions Disp Refills   cyclobenzaprine  (FLEXERIL ) 5 MG tablet [Pharmacy Med Name: CYCLOBENZAPRINE  5 MG TABLET] 30 tablet 0    Sig: TAKE 1 TABLET BY MOUTH THREE TIMES A DAY AS NEEDED FOR MUSCLE SPASMS     Not Delegated - Analgesics:  Muscle Relaxants Failed - 06/23/2024  3:21 PM      Failed - This refill cannot be delegated      Passed - Valid encounter within last 6 months    Recent Outpatient Visits           3 months ago DM type 2 with diabetic peripheral neuropathy [facility])   [place] [facility] Leavy Mole, PA-C   5 months ago DM type 2 with diabetic peripheral neuropathy [id number])   [place] [facility] Leavy Mole, PA-C   5 months ago No-show for appointment   [facility] Leavy Mole, PA-C

## 2024-07-06 ENCOUNTER — Other Ambulatory Visit: Payer: Self-pay | Admitting: Family Medicine

## 2024-07-06 DIAGNOSIS — Z1231 Encounter for screening mammogram for malignant neoplasm of breast: Secondary | ICD-10-CM

## 2024-07-07 ENCOUNTER — Encounter: Payer: Self-pay | Admitting: Family Medicine

## 2024-07-14 ENCOUNTER — Ambulatory Visit
Admission: RE | Admit: 2024-07-14 | Discharge: 2024-07-14 | Disposition: A | Discharge status: Home / Self Care | Source: Ambulatory Visit | Attending: Family Medicine | Admitting: Family Medicine

## 2024-07-14 DIAGNOSIS — Z1231 Encounter for screening mammogram for malignant neoplasm of breast: Secondary | ICD-10-CM | POA: Insufficient documentation

## 2024-07-16 ENCOUNTER — Other Ambulatory Visit: Payer: Self-pay | Admitting: Medical Genetics

## 2024-07-16 ENCOUNTER — Ambulatory Visit: Payer: Self-pay | Admitting: Family Medicine

## 2024-07-21 ENCOUNTER — Ambulatory Visit: Payer: Self-pay | Admitting: Podiatry

## 2024-07-21 ENCOUNTER — Encounter: Payer: Self-pay | Admitting: Family Medicine

## 2024-07-21 ENCOUNTER — Ambulatory Visit: Admitting: Family Medicine

## 2024-07-21 ENCOUNTER — Ambulatory Visit (INDEPENDENT_AMBULATORY_CARE_PROVIDER_SITE_OTHER)

## 2024-07-21 ENCOUNTER — Encounter: Payer: Self-pay | Admitting: Podiatry

## 2024-07-21 VITALS — BP 122/70 | HR 87 | Resp 16 | Ht 64.0 in | Wt 206.0 lb

## 2024-07-21 VITALS — Ht 64.0 in | Wt 206.0 lb

## 2024-07-21 DIAGNOSIS — Z23 Encounter for immunization: Secondary | ICD-10-CM

## 2024-07-21 DIAGNOSIS — Z794 Long term (current) use of insulin: Secondary | ICD-10-CM

## 2024-07-21 DIAGNOSIS — E1165 Type 2 diabetes mellitus with hyperglycemia: Secondary | ICD-10-CM

## 2024-07-21 DIAGNOSIS — Z6835 Body mass index (BMI) 35.0-35.9, adult: Secondary | ICD-10-CM

## 2024-07-21 DIAGNOSIS — E0842 Diabetes mellitus due to underlying condition with diabetic polyneuropathy: Secondary | ICD-10-CM | POA: Diagnosis not present

## 2024-07-21 DIAGNOSIS — M7751 Other enthesopathy of right foot: Secondary | ICD-10-CM | POA: Diagnosis not present

## 2024-07-21 DIAGNOSIS — E1169 Type 2 diabetes mellitus with other specified complication: Secondary | ICD-10-CM | POA: Diagnosis not present

## 2024-07-21 DIAGNOSIS — M79673 Pain in unspecified foot: Secondary | ICD-10-CM

## 2024-07-21 DIAGNOSIS — I10 Essential (primary) hypertension: Secondary | ICD-10-CM

## 2024-07-21 DIAGNOSIS — E785 Hyperlipidemia, unspecified: Secondary | ICD-10-CM

## 2024-07-21 DIAGNOSIS — M5416 Radiculopathy, lumbar region: Secondary | ICD-10-CM

## 2024-07-21 DIAGNOSIS — M7752 Other enthesopathy of left foot: Secondary | ICD-10-CM | POA: Diagnosis not present

## 2024-07-21 DIAGNOSIS — N184 Chronic kidney disease, stage 4 (severe): Secondary | ICD-10-CM | POA: Diagnosis not present

## 2024-07-21 DIAGNOSIS — E66812 Obesity, class 2: Secondary | ICD-10-CM

## 2024-07-21 DIAGNOSIS — F320 Major depressive disorder, single episode, mild: Secondary | ICD-10-CM

## 2024-07-21 DIAGNOSIS — E1142 Type 2 diabetes mellitus with diabetic polyneuropathy: Secondary | ICD-10-CM

## 2024-07-21 LAB — LIPID PANEL
Cholesterol: 105 mg/dL (ref <200)
HDL: 45 mg/dL — ABNORMAL LOW (ref >=50)
LDL Cholesterol (Calc): 45 mg/dL
Non-HDL Cholesterol (Calc): 60 mg/dL (ref <130)
Total CHOL/HDL Ratio: 2.3 (calc) (ref <5.0)
Triglycerides: 69 mg/dL (ref <150)

## 2024-07-21 MED ORDER — DEXCOM G7 SENSOR MISC: 3 refills | Status: AC

## 2024-07-21 MED ORDER — TIRZEPATIDE 15 MG/0.5ML ~~LOC~~ SOAJ: 15.0000 mg | SUBCUTANEOUS | 2 refills | Status: AC

## 2024-07-21 MED ORDER — CARVEDILOL 12.5 MG PO TABS
12.5000 mg | ORAL_TABLET | Freq: Two times a day (BID) | ORAL | 1 refills | Status: AC
Start: 2024-07-21 — End: ?

## 2024-07-21 MED ORDER — AMLODIPINE BESYLATE 10 MG PO TABS: 10.0000 mg | ORAL_TABLET | Freq: Every day | ORAL | 1 refills | Status: AC

## 2024-07-21 MED ORDER — HYDRALAZINE HCL 100 MG PO TABS
100.0000 mg | ORAL_TABLET | Freq: Three times a day (TID) | ORAL | 1 refills | Status: AC
Start: 2024-07-21 — End: ?

## 2024-07-21 NOTE — Progress Notes (Signed)
 Chief Complaint  Patient presents with   Foot Pain    neuropathy, diabetic. She states it feel like the area below her toes are bunched up for years. She states she has tried massage machines, OTC medicines with no help.     HPI: 53 y.o. female PMHx T2DM, last A1c 06/16/2024 was 5.7, presenting today for evaluation of peripheral polyneuropathy to the bilateral feet for several years.  She has tried different modalities including oral medication and gabapentin  which helps minimally.  She states however that when she does not take the gabapentin  the neuropathy can be severe Also states that she has a long history of lumbar back pain and pathology.  Lab Results  Component Value Date   HGBA1C 5.7 06/16/2024   HGBA1C 7.7 (A) 01/17/2024   HGBA1C 7.8 (H) 09/09/2023   Past Medical History:  Diagnosis Date   Asthma    Chronic kidney disease    stage 4   Diabetes mellitus without complication (HCC)    Diabetic retinopathy of both eyes associated with type 2 diabetes mellitus (HCC) 04/11/2018   Moderate to severe, non-proliferative DR; referred to retinal specialist; April 10, 2018   Hyperlipidemia    Hypertension    Migraine    Excedrin Tension HA OTC    Past Surgical History:  Procedure Laterality Date   BREAST REDUCTION SURGERY  2001   CATARACT EXTRACTION W/PHACO Left 04/03/2023   Procedure: CATARACT EXTRACTION PHACO AND INTRAOCULAR LENS PLACEMENT (IOC) LEFT DIABETIC;  Surgeon: Mittie Gaskin, MD;  Location: [facility];  Service: Ophthalmology;  Laterality: Left;  2.57 0:21.7   CESAREAN SECTION     COLONOSCOPY WITH PROPOFOL  N/A 08/15/2018   Procedure: COLONOSCOPY WITH PROPOFOL ;  Surgeon: Therisa Bi, MD;  Location: [facility] ENDOSCOPY;  Service: Gastroenterology;  Laterality: N/A;   ESOPHAGOGASTRODUODENOSCOPY (EGD) WITH PROPOFOL  N/A 08/15/2018   Procedure: ESOPHAGOGASTRODUODENOSCOPY (EGD) WITH PROPOFOL ;  Surgeon: Therisa Bi, MD;  Location: [facility] ENDOSCOPY;  Service:  Gastroenterology;  Laterality: N/A;   GIVENS CAPSULE STUDY N/A 09/08/2018   Procedure: GIVENS CAPSULE STUDY;  Surgeon: Therisa Bi, MD;  Location: [facility] ENDOSCOPY;  Service: Gastroenterology;  Laterality: N/A;   REDUCTION MAMMAPLASTY Bilateral 2000   SHOULDER SURGERY Left    TUBAL LIGATION      Allergies  Allergen Reactions   Hydrocodone  Itching    Physical Exam: General: The patient is alert and oriented x3 in no acute distress.  Dermatology: Skin is warm, dry and supple bilateral lower extremities.   Vascular: Palpable pedal pulses bilaterally. Capillary refill within normal limits.  No appreciable edema.  No erythema.  Neurological: Paresthesia sensation noted to the bilateral forefoot.  Light touch and protective threshold diminished  Musculoskeletal Exam: Reducible hammertoe contracture noted bilateral lesser digits  Radiographic Exam B/L feet 07/21/2024:  Normal osseous mineralization. Joint spaces preserved.  No fractures or osseous irregularities noted.  Impression: Negative  Assessment/Plan of Care: 1.  History of lumbar radiculopathy/pathology 2.  Chronic severe peripheral polyneuropathy secondary to diabetes  -Patient evaluated.  X-rays reviewed -Referral placed to Dr. Wallie Sherry, pain management, to discuss additional modalities and options regarding the peripheral polyneuropathy complicated by history of lumbar radiculopathy -In the meantime continue gabapentin  as prescribed, 300 mg BID -Return to clinic with me PRN       Thresa EMERSON Sar, DPM [facility]  Dr. Thresa EMERSON Sar, DPM    [address]  [place], [place] [address]                Office [phone]  Fax [phone]

## 2024-07-21 NOTE — Progress Notes (Signed)
 Name: Janice Jennings   MRN: [id number]    DOB: 02/15/1971   Date:07/21/2024       Progress Note  Chief Complaint  Patient presents with   Medical Management of Chronic Issues   Diabetes   Hyperlipidemia     Subjective:   Janice Jennings is a 53 y.o. female, presents to clinic for routine follow up on chronic conditions  diabetes mellitus managed with insulin  and mounjaro  15 mg  Wt Readings from Last 5 Encounters:  07/21/24 206 lb (93.4 kg)  07/21/24 206 lb (93.4 kg)  03/20/24 206 lb (93.4 kg)  01/17/24 205 lb 9.6 oz (93.3 kg)  09/09/23 202 lb 11.2 oz (91.9 kg)   BMI Readings from Last 5 Encounters:  07/21/24 35.36 kg/m  07/21/24 35.36 kg/m  03/20/24 35.36 kg/m  01/17/24 35.29 kg/m  09/09/23 34.79 kg/m  Need CGM meters, problem with insurance coverage      Lab Results  Component Value Date   CHOL 117 09/09/2023   HDL 48 (L) 09/09/2023   LDLCALC 51 09/09/2023   TRIG 93 09/09/2023   CHOLHDL 2.4 09/09/2023   Discussed the use of AI scribe software for clinical note transcription with the patient, who gave verbal consent to proceed.  History of Present Illness Janice Jennings is a 53 year old female with diabetes who presents for a routine follow-up.  Glycemic control and hypoglycemia - Diabetes managed with 15 units of insulin , Mounjaro , and Jardiance  (90-day supply) - A1c is 5.7% - Episodes of hypoglycemia despite overall well-controlled blood glucose - Uses Dexcom G7 sensor for continuous glucose monitoring  Appetite and weight stability - Mounjaro  suppresses appetite - Weight stable between 202 to 206 pounds since November  Paresthesia and foot sensation - Sensation in foot described as 'crunched up,' similar to walking on scrunched socks - Evaluated by podiatrist, who recommended referral to a specialist for further evaluation of possible nerve or lower back etiology  Mood disturbance - Feels depressed - Considering speaking with a  therapist  Parasomnia - Recent new episode of sleepwalking - No previous episodes of sleepwalking - Family expresses concern regarding this behavior  Specialist follow-up and laboratory monitoring - No recent laboratory work with specialists since last November     Current Outpatient Medications:    amLODipine  (NORVASC ) 10 MG tablet, TAKE 1 TABLET BY MOUTH EVERY DAY, Disp: 90 tablet, Rfl: 1   atorvastatin  (LIPITOR ) 80 MG tablet, TAKE 1 TABLET BY MOUTH EVERYDAY AT BEDTIME, Disp: 90 tablet, Rfl: 1   Blood Glucose Monitoring Suppl DEVI, 1 each by Does not apply route in the morning, at noon, and at bedtime. E11.65, Z79.4 Check BG TID May substitute to any manufacturer covered by patient's insurance., Disp: 1 each, Rfl: 0   carvedilol  (COREG ) 12.5 MG tablet, TAKE 1 TABLET (12.5MG  TOTAL) BY MOUTH TWICE A DAY WITH MEALS, Disp: 180 tablet, Rfl: 1   cloNIDine  (CATAPRES  - DOSED IN MG/24 HR) 0.1 mg/24hr patch, Place 1 patch (0.1 mg total) onto the skin once a week., Disp: 4 patch, Rfl: 8   cyclobenzaprine  (FLEXERIL ) 5 MG tablet, TAKE 1 TABLET BY MOUTH THREE TIMES A DAY AS NEEDED FOR MUSCLE SPASMS, Disp: 30 tablet, Rfl: 0   ezetimibe  (ZETIA ) 10 MG tablet, Take 1 tablet (10 mg total) by mouth daily., Disp: 90 tablet, Rfl: 3   ferrous sulfate 325 (65 FE) MG tablet, Take 325 mg by mouth daily with breakfast., Disp: , Rfl:    gabapentin  (NEURONTIN ) 300 MG capsule, Take  1 capsule (300 mg total) by mouth 2 (two) times daily., Disp: 180 capsule, Rfl: 2   Glucose Blood (BLOOD GLUCOSE TEST STRIPS) STRP, 1 each by In Vitro route in the morning, at noon, and at bedtime. E11.65, Z79.4 Check BG TID May substitute to any manufacturer covered by patient's insurance., Disp: 100 strip, Rfl: 3   hydrALAZINE  (APRESOLINE ) 100 MG tablet, TAKE 1 TABLET BY MOUTH 3 TIMES DAILY., Disp: 270 tablet, Rfl: 1   insulin  glargine (LANTUS  SOLOSTAR) 100 UNIT/ML Solostar Pen, INJECT 60 UNITS INTO THE SKIN AT BEDTIME., Disp: 45 mL, Rfl:  1   Insulin  Pen Needle (BD PEN NEEDLE NANO U/F) 32G X 4 MM MISC, USE AS DIRECTED WITH INSULIN   DAILY, Disp: 90 each, Rfl: 2   JARDIANCE  10 MG TABS tablet, TAKE 1 TABLET BY MOUTH EVERY DAY, Disp: 90 tablet, Rfl: 0   Lancets Misc. MISC, 1 each by Does not apply route in the morning, at noon, and at bedtime. E11.65, Z79.4 Check BG TID May substitute to any manufacturer covered by patient's insurance., Disp: 100 each, Rfl: 5   lidocaine  (LIDODERM ) 5 %, Place 1 patch onto the skin daily. Remove & Discard patch  within 24 h or as directed by MD, Disp: 30 patch, Rfl: 0   losartan  (COZAAR ) 100 MG tablet, TAKE 1 TABLET BY MOUTH EVERY DAY, Disp: 90 tablet, Rfl: 1   Multiple Vitamin (MULTIVITAMIN) tablet, Take 1 tablet by mouth daily., Disp: , Rfl:    nystatin  cream (MYCOSTATIN ), Apply 1 Application topically 2 (two) times daily., Disp: 30 g, Rfl: 0   tirzepatide  (MOUNJARO ) 15 MG/0.5ML Pen, Inject 15 mg into the skin once a week., Disp: 6 mL, Rfl: 2   venlafaxine  XR (EFFEXOR -XR) 75 MG 24 hr capsule, Take 1 capsule (75 mg total) by mouth daily with breakfast., Disp: 90 capsule, Rfl: 3  Patient Active Problem List   Diagnosis Date Noted   Elevated sedimentation rate 08/01/2021   Rheumatoid factor positive 08/01/2021   Bilateral wrist pain 08/01/2021   Chronic kidney disease, stage IV (severe) (HCC) 04/25/2021   Secondary hyperparathyroidism of renal origin 10/27/2020   Mild intermittent asthma without complication 10/27/2020   Hot flashes due to menopause 07/08/2020   Osteoarthritis of knee 07/08/2020   Pain in joint of left shoulder 07/08/2020   Lumbar radiculopathy 11/10/2019   Type 2 diabetes mellitus with hyperglycemia, with long-term current use of insulin  (HCC) 09/22/2019   Anemia in chronic kidney disease 08/31/2019   Benign hypertensive kidney disease with chronic kidney disease 08/31/2019   Proteinuria 08/31/2019   Type 2 diabetes mellitus with diabetic chronic kidney disease (HCC) 08/31/2019    Hyperlipidemia associated with type 2 diabetes mellitus (HCC) 08/10/2019   Class 2 severe obesity with serious comorbidity and body mass index (BMI) of 35.0 to 35.9 in adult 09/22/2018   Diabetic retinopathy of both eyes associated with type 2 diabetes mellitus (HCC) 04/11/2018   Essential hypertension 03/07/2015   DM type 2 with diabetic peripheral neuropathy (HCC) 05/26/2014    Past Surgical History:  Procedure Laterality Date   BREAST REDUCTION SURGERY  2001   CATARACT EXTRACTION W/PHACO Left 04/03/2023   Procedure: CATARACT EXTRACTION PHACO AND INTRAOCULAR LENS PLACEMENT (IOC) LEFT DIABETIC;  Surgeon: Mittie Gaskin, MD;  Location: [facility];  Service: Ophthalmology;  Laterality: Left;  2.57 0:21.7   CESAREAN SECTION     COLONOSCOPY WITH PROPOFOL  N/A 08/15/2018   Procedure: COLONOSCOPY WITH PROPOFOL ;  Surgeon: Therisa Bi, MD;  Location: [facility] ENDOSCOPY;  Service: Gastroenterology;  Laterality: N/A;   ESOPHAGOGASTRODUODENOSCOPY (EGD) WITH PROPOFOL  N/A 08/15/2018   Procedure: ESOPHAGOGASTRODUODENOSCOPY (EGD) WITH PROPOFOL ;  Surgeon: Therisa Bi, MD;  Location: [facility] ENDOSCOPY;  Service: Gastroenterology;  Laterality: N/A;   GIVENS CAPSULE STUDY N/A 09/08/2018   Procedure: GIVENS CAPSULE STUDY;  Surgeon: Therisa Bi, MD;  Location: [facility] ENDOSCOPY;  Service: Gastroenterology;  Laterality: N/A;   REDUCTION MAMMAPLASTY Bilateral 2000   SHOULDER SURGERY Left    TUBAL LIGATION      Family History  Problem Relation Age of Onset   Diabetes Father    Hyperlipidemia Father    Hypertension Father    Cancer Maternal Aunt 45       leukemia - died   Diabetes Maternal Grandmother    Cancer Maternal Grandmother        breast cancer   Stroke Maternal Grandmother    Breast cancer Maternal Grandmother    Diabetes Daughter        Pre-diabetes   Diabetes Maternal Grandfather    Hyperlipidemia Maternal Grandfather    Hypertension Maternal Grandfather    Cancer Maternal Aunt 50        breast cancer - died   Ovarian cancer Neg Hx    Colon cancer Neg Hx     Social History   Tobacco Use   Smoking status: Former    Current packs/day: 0.00    Types: Cigarettes    Quit date: 2001    Years since quitting: 24.7   Smokeless tobacco: Never   Tobacco comments:    Smoked for 1 year.  2001  Vaping Use   Vaping status: Never Used  Substance Use Topics   Alcohol use: Yes    Alcohol/week: 0.0 standard drinks of alcohol    Comment: occ   Drug use: No     Allergies  Allergen Reactions   Hydrocodone  Itching    Health Maintenance  Topic Date Due   Cervical Cancer Screening (HPV/Pap Cotest)  08/23/2024   DTaP/Tdap/Td (2 - Tdap) 07/20/2025 (Originally 05/26/2024)   Hepatitis B Vaccines 19-59 Average Risk (1 of 3 - 19+ 3-dose series) 07/20/2025 (Originally 03/14/1988)   OPHTHALMOLOGY EXAM  11/06/2024   HEMOGLOBIN A1C  12/17/2024   FOOT EXAM  03/20/2025   Diabetic kidney evaluation - eGFR measurement  06/16/2025   Diabetic kidney evaluation - Urine ACR  06/16/2025   Mammogram  07/14/2025   Colonoscopy  08/15/2028   Pneumococcal Vaccine: 50+ Years  Completed   Influenza Vaccine  Completed   COVID-19 Vaccine  Completed   Hepatitis C Screening  Completed   HIV Screening  Completed   Zoster Vaccines- Shingrix  Completed   HPV VACCINES  Aged Out   Meningococcal B Vaccine  Aged Out    Chart Review Today: ***  Review of Systems   Objective:   Vitals:   07/21/24 1101  BP: 122/70  Pulse: 87  Resp: 16  SpO2: 96%  Weight: 206 lb (93.4 kg)  Height: 5' 4 (1.626 m)    Body mass index is 35.36 kg/m.  Physical Exam   Functional Status Survey:   Results for orders placed or performed in visit on 07/20/24  Microalbumin / creatinine urine ratio   Collection Time: 06/16/24 12:00 AM  Result Value Ref Range   Microalb Creat Ratio 120.2   Protein / creatinine ratio, urine   Collection Time: 06/16/24 12:00 AM  Result Value Ref Range   Creatinine, Urine  52.4    Albumin, U 6.3  Basic metabolic panel with GFR   Collection Time: 06/16/24 12:00 AM  Result Value Ref Range   Glucose 129    BUN 29 (A) 4 - 21   CO2 27 (A) 13 - 22   Creatinine 2.2 (A) 0.5 - 1.1   Potassium 5.1 3.5 - 5.1 mEq/L   Sodium 144 137 - 147   Chloride 109 (A) 99 - 108  Comprehensive metabolic panel with GFR   Collection Time: 06/16/24 12:00 AM  Result Value Ref Range   eGFR 27    Calcium  10.3 8.7 - 10.7  Hemoglobin A1c   Collection Time: 06/16/24 12:00 AM  Result Value Ref Range   Hemoglobin A1C 5.7       Assessment & Plan:   Immunization due -     Flu vaccine trivalent PF, 6mos and older(Flulaval,Afluria,Fluarix,Fluzone)  Essential hypertension  Essential hypertension  Hyperlipidemia associated with type 2 diabetes mellitus (HCC)  Essential hypertension  Hyperlipidemia associated with type 2 diabetes mellitus (HCC)  Class 2 severe obesity with serious comorbidity and body mass index (BMI) of 35.0 to 35.9 in adult, unspecified obesity type   Assessment and Plan Assessment & Plan Type 2 diabetes mellitus with hyperglycemia and diabetic peripheral neuropathy Blood sugar levels are well-controlled with an A1c of 5.7, below the target of 7.0. Risk of hypoglycemic episodes due to low blood sugar readings. Current insulin  dose may be higher than necessary, increasing risk of side effects. Continuous glucose monitoring managed with Dexcom G7 sensors, approved for use from August 2025 to August 2026. Insurance issues led to switch to Dexcom G7. - Monitor blood sugar levels to avoid hypoglycemic episodes. - Decrease insulin  doses as needed to prevent low blood sugar. - Ensure continuous glucose monitoring with Dexcom G7 sensors. - Refill Mounjaro  at the current dose. - Refill all other diabetes-related medications.  Essential hypertension Blood pressure is well-controlled with current medication regimen. - Continue current antihypertensive medications:  amlodipine , carvedilol , hydralazine , and losartan . - Refill all antihypertensive medications.  Obesity, class 2 Weight has been stable between 202-206 lbs since last November. Current medication, Mounjaro , is helping with blood sugar control and appetite suppression. - Continue Mounjaro  at the current dose. - Encourage adequate nutrition, including sufficient protein intake, to support muscle mass and bone health.  Depression Reports of feeling really depressed. Considering talking to a therapist for support. - Research in-network therapists covered by insurance and inform the provider for referral if needed.  Sleepwalking (parasomnia, unspecified) Recent episode of sleepwalking, a new occurrence. Potentially related to increased stress and depression. - Consider consulting a sleep medicine specialist if episodes persist. - Address underlying stress and depression, potentially through therapy.  Recording duration: 14 minutes    No follow-ups on file.   Michelene Cower, PA-C 07/21/24 11:24 AM

## 2024-07-23 ENCOUNTER — Encounter: Payer: Self-pay | Admitting: Family Medicine

## 2024-07-23 ENCOUNTER — Ambulatory Visit: Payer: Self-pay | Admitting: Family Medicine

## 2024-07-23 DIAGNOSIS — F320 Major depressive disorder, single episode, mild: Secondary | ICD-10-CM | POA: Insufficient documentation

## 2024-07-23 NOTE — Assessment & Plan Note (Signed)
    09/09/2023    1:01 PM 06/03/2023    3:52 PM 03/18/2023    9:13 AM  Depression screen PHQ 2/9  Decreased Interest 0 0 0  Down, Depressed, Hopeless 0 0 0  PHQ - 2 Score 0 0 0  Altered sleeping 0 0 0  Tired, decreased energy 0 0 0  Change in appetite 0 0 0  Feeling bad or failure about yourself  0 0 0  Trouble concentrating 0 0 0  Moving slowly or fidgety/restless 0 0 0  Suicidal thoughts 0 0 0  PHQ-9 Score 0 0 0  Difficult doing work/chores Not difficult at all Not difficult at all Not difficult at all  States she is depressed some, she did not wish to change medications right now, on venlafaxine  - could increase dose She is interested in therapist - she will check with insurance and let us  know who she wants to see and if there is a referral requirement  Depression Reports of feeling really depressed. Considering talking to a therapist for support. - Research in-network therapists covered by insurance and inform the provider for referral if needed.

## 2024-07-23 NOTE — Assessment & Plan Note (Signed)
 Type 2 diabetes mellitus with hyperglycemia and diabetic peripheral neuropathy Blood sugar levels are well-controlled with an A1c of 5.7, below the target of 7.0. Risk of hypoglycemic episodes due to low blood sugar readings. Current insulin  dose may be higher than necessary, increasing risk of side effects. Continuous glucose monitoring managed with Dexcom G7 sensors, approved for use from August 2025 to August 2026. Insurance issues led to switch to Dexcom G7. - Monitor blood sugar levels to avoid hypoglycemic episodes. - Decrease insulin  doses as needed to prevent low blood sugar. Fasting goals 80 to 130's  - Ensure continuous glucose monitoring with Dexcom G7 sensors. - Refill Mounjaro  at the current dose. - Refill all other diabetes-related medications. Current basal insulin  dose is 40 - not monitoring sugars, no known lows or highs   Lab Results  Component Value Date   HGBA1C 5.7 06/16/2024   HGBA1C 7.7 (A) 01/17/2024   HGBA1C 7.8 (H) 09/09/2023   HGBA1C 6.6 (H) 03/01/2023   HGBA1C 7.7 (H) 11/28/2022

## 2024-07-23 NOTE — Assessment & Plan Note (Signed)
 managed on lipitor  80 and zetia ,  Good compliance and no SE or concerns Last year LDL was at goal - 51 labs annually- due in Nov about 2 months - pt will complete lipid panel today

## 2024-07-23 NOTE — Assessment & Plan Note (Signed)
 BP Readings from Last 3 Encounters:  07/21/24 122/70  03/20/24 130/82  01/17/24 (!) 140/90   Blood pressure is well-controlled with current medication regimen. - Continue current antihypertensive medications: amlodipine , carvedilol , hydralazine , and losartan . - Refill all antihypertensive medications. Nephrology does a lot of her refills and labs for HTN and CKD, BP at goal today

## 2024-07-23 NOTE — Assessment & Plan Note (Signed)
 Per nephro, last OV and labs reviewed today

## 2024-07-23 NOTE — Assessment & Plan Note (Signed)
 Wt Readings from Last 5 Encounters:  07/21/24 206 lb (93.4 kg)  07/21/24 206 lb (93.4 kg)  03/20/24 206 lb (93.4 kg)  01/17/24 205 lb 9.6 oz (93.3 kg)  09/09/23 202 lb 11.2 oz (91.9 kg)   BMI Readings from Last 5 Encounters:  07/21/24 35.36 kg/m  07/21/24 35.36 kg/m  03/20/24 35.36 kg/m  01/17/24 35.29 kg/m  09/09/23 34.79 kg/m   Weight fairly stable even with decreased appetite Obesity, class 2 Weight has been stable between 202-206 lbs since last November. Current medication, Mounjaro , is helping with blood sugar control and appetite suppression. - Continue Mounjaro  at the current dose. - Encourage adequate nutrition, including sufficient protein intake, to support muscle mass and bone health.

## 2024-07-24 ENCOUNTER — Ambulatory Visit: Admitting: Family Medicine

## 2024-07-28 ENCOUNTER — Other Ambulatory Visit: Payer: Self-pay | Admitting: Family Medicine

## 2024-07-28 ENCOUNTER — Ambulatory Visit: Admitting: Family Medicine

## 2024-07-28 DIAGNOSIS — E1169 Type 2 diabetes mellitus with other specified complication: Secondary | ICD-10-CM

## 2024-07-28 DIAGNOSIS — E66812 Obesity, class 2: Secondary | ICD-10-CM

## 2024-07-29 NOTE — Telephone Encounter (Signed)
 Requested Prescriptions  Pending Prescriptions Disp Refills   ezetimibe  (ZETIA ) 10 MG tablet [Pharmacy Med Name: EZETIMIBE  10 MG TABLET] 90 tablet 3    Sig: TAKE 1 TABLET BY MOUTH EVERY DAY     Cardiovascular:  Antilipid - Sterol Transport Inhibitors Failed - 07/29/2024  1:59 PM      Failed - Lipid Panel in normal range within the last 12 months    Cholesterol  Date Value Ref Range Status  07/21/2024 105 <200 mg/dL Final   LDL Cholesterol (Calc)  Date Value Ref Range Status  07/21/2024 45 mg/dL (calc) Final    Comment:    Reference range: <100 . Desirable range <100 mg/dL for primary prevention;   <70 mg/dL for patients with CHD or diabetic patients  with > or = 2 CHD risk factors. SABRA LDL-C is now calculated using the Martin-Hopkins  calculation, which is a validated novel method providing  better accuracy than the Friedewald equation in the  estimation of LDL-C.  Gladis APPLETHWAITE et al. SANDREA. 7986;689(80): 2061-2068  (http://education.[facility].com/faq/FAQ164)    HDL  Date Value Ref Range Status  07/21/2024 45 (L) > OR = 50 mg/dL Final   Triglycerides  Date Value Ref Range Status  07/21/2024 69 <150 mg/dL Final         Passed - AST in normal range and within 360 days    AST  Date Value Ref Range Status  09/09/2023 17 10 - 35 U/L Final   SGOT(AST)  Date Value Ref Range Status  02/14/2013 13 (L) 15 - 37 Unit/L Final         Passed - ALT in normal range and within 360 days    ALT  Date Value Ref Range Status  09/09/2023 20 6 - 29 U/L Final   SGPT (ALT)  Date Value Ref Range Status  02/14/2013 18 12 - 78 U/L Final         Passed - Patient is not pregnant      Passed - Valid encounter within last 12 months    Recent Outpatient Visits           1 week ago Type 2 diabetes mellitus with hyperglycemia, with long-term current use of insulin  [facility])   [facility] Leavy Mole, PA-C   4 months ago DM type 2 with diabetic peripheral  neuropathy [facility])   [place] [facility] Leavy Mole, PA-C   6 months ago DM type 2 with diabetic peripheral neuropathy [facility])   [place] Leavy Mole, PA-C   6 months ago No-show for appointment   [facility] Tapia, Leisa, PA-C               atorvastatin  (LIPITOR ) 80 MG tablet [Pharmacy Med Name: ATORVASTATIN  80 MG TABLET] 90 tablet 3    Sig: TAKE 1 TABLET BY MOUTH EVERYDAY AT BEDTIME     Cardiovascular:  Antilipid - Statins Failed - 07/29/2024  1:59 PM      Failed - Lipid Panel in normal range within the last 12 months    Cholesterol  Date Value Ref Range Status  07/21/2024 105 <200 mg/dL Final   LDL Cholesterol (Calc)  Date Value Ref Range Status  07/21/2024 45 mg/dL (calc) Final    Comment:    Reference range: <100 . Desirable range <100 mg/dL for primary prevention;   <70 mg/dL for patients with CHD or diabetic patients  with > or = 2 CHD risk  factors. SABRA LDL-C is now calculated using the Martin-Hopkins  calculation, which is a validated novel method providing  better accuracy than the Friedewald equation in the  estimation of LDL-C.  Gladis APPLETHWAITE et al. SANDREA. 7986;689(80): 2061-2068  (http://education.[facility].com/faq/FAQ164)    HDL  Date Value Ref Range Status  07/21/2024 45 (L) > OR = 50 mg/dL Final   Triglycerides  Date Value Ref Range Status  07/21/2024 69 <150 mg/dL Final         Passed - Patient is not pregnant      Passed - Valid encounter within last 12 months    Recent Outpatient Visits           1 week ago Type 2 diabetes mellitus with hyperglycemia, with long-term current use of insulin  [facility])   [facility] Leavy Mole, PA-C   4 months ago DM type 2 with diabetic peripheral neuropathy [facility])   [facility] Leavy Mole, PA-C   6 months ago DM type 2 with diabetic peripheral neuropathy [facility])     [facility] Leavy Mole, PA-C   6 months ago No-show for appointment   [facility] Leavy Mole, PA-C

## 2024-08-18 ENCOUNTER — Ambulatory Visit: Admitting: Student in an Organized Health Care Education/Training Program

## 2024-08-25 ENCOUNTER — Other Ambulatory Visit: Payer: Self-pay | Admitting: Family Medicine

## 2024-08-25 ENCOUNTER — Encounter: Payer: Self-pay | Admitting: Family Medicine

## 2024-08-25 DIAGNOSIS — M5416 Radiculopathy, lumbar region: Secondary | ICD-10-CM

## 2024-08-25 MED ORDER — BD PEN NEEDLE NANO ULTRAFINE 32G X 4 MM MISC: 2 refills | Status: AC

## 2024-08-27 NOTE — Telephone Encounter (Signed)
 Lets go ahead and get her a f/u in jan.

## 2024-08-27 NOTE — Telephone Encounter (Signed)
 Requested medication (s) are due for refill today: Yes  Requested medication (s) are on the active medication list: Yes  Last refill:  06/23/24  Future visit scheduled: Yes  Notes to clinic:  Not delegated.    Requested Prescriptions  Pending Prescriptions Disp Refills   cyclobenzaprine  (FLEXERIL ) 5 MG tablet [Pharmacy Med Name: CYCLOBENZAPRINE  5 MG TABLET] 30 tablet 0    Sig: TAKE 1 TABLET BY MOUTH THREE TIMES A DAY AS NEEDED FOR MUSCLE SPASMS     Not Delegated - Analgesics:  Muscle Relaxants Failed - 08/27/2024 10:37 AM      Failed - This refill cannot be delegated      Passed - Valid encounter within last 6 months    Recent Outpatient Visits           1 month ago Type 2 diabetes mellitus with hyperglycemia, with long-term current use of insulin  [facility])   [facility] Leavy Mole, [facility]-C   5 months ago DM type 2 with diabetic peripheral neuropathy [facility])   [facility] Leavy Mole, PA-C   7 months ago DM type 2 with diabetic peripheral neuropathy [facility])   [place] [facility] Leavy Mole, PA-C   7 months ago No-show for appointment   [facility] Leavy Mole, PA-C

## 2024-08-28 NOTE — Telephone Encounter (Signed)
 Appt schd.

## 2024-09-10 ENCOUNTER — Other Ambulatory Visit: Payer: Self-pay | Admitting: Family Medicine

## 2024-09-10 DIAGNOSIS — E1165 Type 2 diabetes mellitus with hyperglycemia: Secondary | ICD-10-CM

## 2024-09-11 NOTE — Telephone Encounter (Signed)
 Requested medication (s) are due for refill today: na   Requested medication (s) are on the active medication list: yes   Last refill:  03/24/24 #45 ml 1 refills  Future visit scheduled: yes 11/20/24  Notes to clinic:  medication not assigned to a protocol. Do you want to refill Rx?     Requested Prescriptions  Pending Prescriptions Disp Refills   insulin  glargine-yfgn (SEMGLEE ) 100 UNIT/ML Pen [Pharmacy Med Name: INSULIN  GLARGINE-YFGN U100 PEN]  1    Sig: INJECT 60 UNITS UNDER THE SKIN AT BEDTIME     Off-Protocol Failed - 09/11/2024  3:24 PM      Failed - Medication not assigned to a protocol, review manually.      Passed - Valid encounter within last 12 months    Recent Outpatient Visits           1 month ago Type 2 diabetes mellitus with hyperglycemia, with long-term current use of insulin  [facility])   [place] [facility] Mole, [facility]-C   5 months ago DM type 2 with diabetic peripheral neuropathy [facility])   [place] [facility] Leavy Mole, PA-C   7 months ago DM type 2 with diabetic peripheral neuropathy [facility])   [place] [facility] Leavy Mole, PA-C   8 months ago No-show for appointment   [facility] Leavy Mole, PA-C

## 2024-10-09 ENCOUNTER — Encounter: Payer: Self-pay | Admitting: Nurse Practitioner

## 2024-10-09 DIAGNOSIS — I10 Essential (primary) hypertension: Secondary | ICD-10-CM

## 2024-10-09 DIAGNOSIS — E1165 Type 2 diabetes mellitus with hyperglycemia: Secondary | ICD-10-CM

## 2024-10-09 MED ORDER — LOSARTAN POTASSIUM 100 MG PO TABS: 100.0000 mg | ORAL_TABLET | Freq: Every day | ORAL | 0 refills | Status: AC

## 2024-10-09 MED ORDER — EMPAGLIFLOZIN 10 MG PO TABS: 10.0000 mg | ORAL_TABLET | Freq: Every day | ORAL | 0 refills | Status: AC

## 2024-10-13 ENCOUNTER — Inpatient Hospital Stay
Admission: RE | Admit: 2024-10-13 | Discharge: 2024-10-13 | Payer: Self-pay | Attending: Medical Genetics | Admitting: Medical Genetics

## 2024-10-24 LAB — GENECONNECT MOLECULAR SCREEN: Genetic Analysis Overall Interpretation: NEGATIVE

## 2024-11-05 ENCOUNTER — Other Ambulatory Visit: Payer: Self-pay | Admitting: Family Medicine

## 2024-11-05 DIAGNOSIS — Z794 Long term (current) use of insulin: Secondary | ICD-10-CM

## 2024-11-05 NOTE — Telephone Encounter (Signed)
 Medication no longer listed on current medication list- patient prescribed Semglee  Requested Prescriptions  Pending Prescriptions Disp Refills   LANTUS  SOLOSTAR 100 UNIT/ML Solostar Pen [Pharmacy Med Name: LANTUS  SOLOSTAR 100 UNIT/ML]  1    Sig: INJECT 60 UNITS UNDER THE SKIN AT BEDTIME     Endocrinology:  Diabetes - Insulins Passed - 11/05/2024  3:42 PM      Passed - HBA1C is between 0 and 7.9 and within 180 days    Hemoglobin A1C  Date Value Ref Range Status  06/16/2024 5.7  Final         Passed - Valid encounter within last 6 months    Recent Outpatient Visits           3 months ago Type 2 diabetes mellitus with hyperglycemia, with long-term current use of insulin  [facility])   [place] [facility] Leavy Mole, PA-C   7 months ago DM type 2 with diabetic peripheral neuropathy [facility])   [facility] Leavy Mole, PA-C   9 months ago DM type 2 with diabetic peripheral neuropathy [facility])   [facility] Mole, PA-C   9 months ago No-show for appointment   [facility] Leavy Mole, PA-C

## 2024-11-20 ENCOUNTER — Ambulatory Visit: Admitting: Nurse Practitioner

## 2024-11-20 ENCOUNTER — Encounter: Payer: Self-pay | Admitting: Nurse Practitioner

## 2024-11-20 ENCOUNTER — Other Ambulatory Visit (HOSPITAL_COMMUNITY)
Admission: RE | Admit: 2024-11-20 | Discharge: 2024-11-20 | Disposition: A | Source: Ambulatory Visit | Attending: Nurse Practitioner | Admitting: Nurse Practitioner

## 2024-11-20 VITALS — BP 128/80 | HR 75 | Temp 97.0°F | Ht 64.0 in | Wt 193.0 lb

## 2024-11-20 DIAGNOSIS — M5416 Radiculopathy, lumbar region: Secondary | ICD-10-CM

## 2024-11-20 DIAGNOSIS — Z113 Encounter for screening for infections with a predominantly sexual mode of transmission: Secondary | ICD-10-CM

## 2024-11-20 DIAGNOSIS — F330 Major depressive disorder, recurrent, mild: Secondary | ICD-10-CM

## 2024-11-20 DIAGNOSIS — N184 Chronic kidney disease, stage 4 (severe): Secondary | ICD-10-CM

## 2024-11-20 DIAGNOSIS — E1142 Type 2 diabetes mellitus with diabetic polyneuropathy: Secondary | ICD-10-CM

## 2024-11-20 DIAGNOSIS — E113393 Type 2 diabetes mellitus with moderate nonproliferative diabetic retinopathy without macular edema, bilateral: Secondary | ICD-10-CM

## 2024-11-20 DIAGNOSIS — J4520 Mild intermittent asthma, uncomplicated: Secondary | ICD-10-CM

## 2024-11-20 DIAGNOSIS — Z6835 Body mass index (BMI) 35.0-35.9, adult: Secondary | ICD-10-CM

## 2024-11-20 DIAGNOSIS — Z794 Long term (current) use of insulin: Secondary | ICD-10-CM

## 2024-11-20 DIAGNOSIS — E66812 Obesity, class 2: Secondary | ICD-10-CM

## 2024-11-20 DIAGNOSIS — I10 Essential (primary) hypertension: Secondary | ICD-10-CM

## 2024-11-20 DIAGNOSIS — E1165 Type 2 diabetes mellitus with hyperglycemia: Secondary | ICD-10-CM

## 2024-11-20 DIAGNOSIS — D631 Anemia in chronic kidney disease: Secondary | ICD-10-CM

## 2024-11-20 DIAGNOSIS — N951 Menopausal and female climacteric states: Secondary | ICD-10-CM

## 2024-11-20 DIAGNOSIS — E1169 Type 2 diabetes mellitus with other specified complication: Secondary | ICD-10-CM

## 2024-11-20 DIAGNOSIS — N2581 Secondary hyperparathyroidism of renal origin: Secondary | ICD-10-CM

## 2024-11-20 LAB — POCT GLYCOSYLATED HEMOGLOBIN (HGB A1C): Hemoglobin A1C: 6.4 % — AB (ref 4.0–5.6)

## 2024-11-20 MED ORDER — CYCLOBENZAPRINE HCL 5 MG PO TABS: 5.0000 mg | ORAL_TABLET | Freq: Three times a day (TID) | ORAL | 1 refills | Status: AC | PRN

## 2024-11-20 MED ORDER — LIDOCAINE 5 % EX PTCH: 1.0000 | MEDICATED_PATCH | CUTANEOUS | 0 refills | Status: AC

## 2024-11-20 MED ORDER — INSULIN GLARGINE-YFGN 100 UNIT/ML ~~LOC~~ SOPN: 30.0000 [IU] | PEN_INJECTOR | Freq: Every day | SUBCUTANEOUS | 12 refills | Status: AC

## 2024-11-20 MED ORDER — CLONIDINE 0.1 MG/24HR TD PTWK: 0.1000 mg | MEDICATED_PATCH | TRANSDERMAL | 3 refills | Status: AC

## 2024-11-20 NOTE — Progress Notes (Signed)
 "  BP 128/80   Pulse 75   Temp (!) 97 F (36.1 C)   Ht 5' 4 (1.626 m)   Wt 193 lb (87.5 kg)   LMP 05/29/2018   SpO2 99%   BMI 33.13 kg/m    Subjective:    Patient ID: Janice Jennings, female    DOB: 03/17/1971, 54 y.o.   MRN: [id number]  HPI: Janice Jennings is a 54 y.o. female  Chief Complaint  Patient presents with   Medication Refill   Discussed the use of AI scribe software for clinical note transcription with the patient, who gave verbal consent to proceed.  History of Present Illness Janice Jennings is a 54 year old female with a history of hypertension, type 2 diabetes, and chronic kidney disease who presents for a routine follow-up.  Hypertension - Treated with amlodipine  10 mg daily, carvedilol  12.5 mg twice daily, clonidine  patch weekly, hydralazine  100 mg three times daily, and losartan  100 mg daily. - No mention of current symptoms related to blood pressure. 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 - Last hemoglobin A1c was 5.7% in August. - Does not check blood glucose regularly. - Stopped using Dexcom since Christmas but restarted today. - Reduced Semglee  insulin  dose to approximately 30 units. - Currently taking Jardiance  10 mg daily, Semglee  insulin  30 units at bedtime (recently reduced), and Mounjaro  15 mg weekly. Lab Results  Component Value Date   HGBA1C 6.4 (A) 11/20/2024   HGBA1C 5.7 06/16/2024   HGBA1C 7.7 (A) 01/17/2024     Chronic kidney disease and associated complications/hyperparathyroidism - Stage 4 chronic kidney disease with most recent GFR of 26. - Followed by nephrology, last seen October 01, 2024. - Elevated parathyroid hormone at 323. - Anemia due to chronic kidney disease. - Diabetic kidney disease present.    Hyperlipidemia - Last LDL was 45. -  Currently taking atorvastatin  80 mg daily and Zetia  10 mg daily. Lipid Panel     Component Value Date/Time   CHOL 105 07/21/2024 1148   TRIG 69 07/21/2024 1148   HDL 45 (L) 07/21/2024 1148   CHOLHDL 2.3 07/21/2024 1148   VLDL 29.0 06/25/2016 1049   LDLCALC 45 07/21/2024 1148     Obesity and weight management - Current weight is 193 pounds, decreased from 206 pounds at last appointment. - Current BMI is 33.13. Wt Readings from Last 3 Encounters:  11/20/24 193 lb (87.5 kg)  07/21/24 206 lb (93.4 kg)  07/21/24 206 lb (93.4 kg)   Body mass index is 33.13 kg/m.  Flowsheet Row Office Visit from 11/20/2024 in [facility]  1 42 inches     Osteoarthritis of the knee - History of osteoarthritis of the knee. - No mention of current symptoms.  Depression/hot flashes - History of depression. - Currently taking Effexor  75 mg daily, clonidine  patch -stable  Asthma - Mild intermittent asthma. -stable   Lumbar radiculopathy -needs refill of flexeril  and lidocaine  patch      11/20/2024    3:25 PM 09/09/2023    1:01 PM 06/03/2023    3:52 PM  Depression screen PHQ 2/9  Decreased Interest 0 0 0  Down, Depressed, Hopeless 0 0 0  PHQ - 2 Score 0 0 0  Altered sleeping 0 0 0  Tired, decreased energy 0 0  0  Change in appetite 0 0 0  Feeling bad or failure about yourself  0 0 0  Trouble concentrating 0 0 0  Moving slowly or fidgety/restless 0 0 0  Suicidal thoughts 0 0 0  PHQ-9 Score 0 0  0   Difficult doing work/chores  Not difficult at all Not difficult at all     Data saved with a previous flowsheet row definition    Relevant past medical, surgical, family and social history reviewed and updated as indicated. Interim medical history since our last visit reviewed. Allergies and medications reviewed and updated.  Review of Systems Constitutional: Negative for fever or weight change.  Respiratory: Negative for cough and shortness of breath.    Cardiovascular: Negative for chest pain or palpitations.  Gastrointestinal: Negative for abdominal pain, no bowel changes.  Musculoskeletal: Negative for gait problem or joint swelling.  Skin: Negative for rash.  Neurological: Negative for dizziness or headache.  No other specific complaints in a complete review of systems (except as listed in HPI above).      Objective:      BP 128/80   Pulse 75   Temp (!) 97 F (36.1 C)   Ht 5' 4 (1.626 m)   Wt 193 lb (87.5 kg)   LMP 05/29/2018   SpO2 99%   BMI 33.13 kg/m    Wt Readings from Last 3 Encounters:  11/20/24 193 lb (87.5 kg)  07/21/24 206 lb (93.4 kg)  07/21/24 206 lb (93.4 kg)    Physical Exam VITALS: BP- 128/80 MEASUREMENTS: Weight- 193, BMI- 33.13. GENERAL: Alert, cooperative, well developed, no acute distress. HEENT: Normocephalic, normal oropharynx, moist mucous membranes. CHEST: Clear to auscultation bilaterally, no wheezes, rhonchi, or crackles. CARDIOVASCULAR: Normal heart rate and rhythm, S1 and S2 normal without murmurs. ABDOMEN: Soft, non-tender, non-distended, without organomegaly, normal bowel sounds. EXTREMITIES: No cyanosis or edema. NEUROLOGICAL: Cranial nerves grossly intact, moves all extremities without gross motor or sensory deficit.  Results for orders placed or performed in visit on 11/20/24  POCT HgB A1C   Collection Time: 11/20/24  3:39 PM  Result Value Ref Range   Hemoglobin A1C 6.4 (A) 4.0 - 5.6 %   HbA1c POC (<> result, manual entry)     HbA1c, POC (prediabetic range)     HbA1c, POC (controlled diabetic range)            Assessment & Plan:   Problem List Items Addressed This Visit       Cardiovascular and Mediastinum   Essential hypertension - Primary (Chronic)   Relevant Medications   cloNIDine  (CATAPRES  - DOSED IN MG/24 HR) 0.1 mg/24hr patch   Other Relevant Orders   CBC with Differential/Platelet   Comprehensive metabolic panel with GFR   Hot flashes due to menopause    Relevant Medications   cloNIDine  (CATAPRES  - DOSED IN MG/24 HR) 0.1 mg/24hr patch     Respiratory   Mild intermittent asthma without complication     Endocrine   Diabetic retinopathy of both eyes associated with type 2 diabetes mellitus (HCC) (Chronic)   Relevant Medications   insulin  glargine-yfgn (SEMGLEE ) 100 UNIT/ML Pen   DM type 2 with diabetic peripheral neuropathy (HCC)   Relevant Medications   insulin  glargine-yfgn (SEMGLEE ) 100 UNIT/ML Pen   cyclobenzaprine  (FLEXERIL ) 5 MG tablet   Other Relevant Orders   POCT HgB A1C (Completed)   Hyperlipidemia associated with type 2 diabetes mellitus (HCC)   Relevant Medications   cloNIDine  (CATAPRES  - DOSED IN MG/24  HR) 0.1 mg/24hr patch   insulin  glargine-yfgn (SEMGLEE ) 100 UNIT/ML Pen   Other Relevant Orders   Lipid panel   Type 2 diabetes mellitus with hyperglycemia, with long-term current use of insulin  (HCC)   Relevant Medications   insulin  glargine-yfgn (SEMGLEE ) 100 UNIT/ML Pen   Other Relevant Orders   POCT HgB A1C (Completed)   Secondary hyperparathyroidism of renal origin     Nervous and Auditory   Lumbar radiculopathy   Relevant Medications   lidocaine  (LIDODERM ) 5 %   cyclobenzaprine  (FLEXERIL ) 5 MG tablet     Genitourinary   Anemia in chronic kidney disease   Relevant Orders   CBC with Differential/Platelet   Chronic kidney disease, stage IV (severe) (HCC)   Relevant Orders   Comprehensive metabolic panel with GFR     Other   Class 2 severe obesity with serious comorbidity and body mass index (BMI) of 35.0 to 35.9 in adult   Relevant Medications   insulin  glargine-yfgn (SEMGLEE ) 100 UNIT/ML Pen   Current mild episode of major depressive disorder   Other Visit Diagnoses       Screening examination for STD (sexually transmitted disease)       Relevant Orders   Cervicovaginal ancillary only   Hepatitis C antibody   HIV Antibody (routine testing w rflx)   RPR W/RFLX TO RPR TITER, TREPONEMAL AB, SCREEN AND  DIAGNOSIS        Assessment and Plan Assessment & Plan Screening for sexually transmitted infections Requested screening for sexually transmitted infections in preparation for upcoming wedding. - Ordered vaginal swab for gonorrhea, chlamydia, and trichomonas - Ordered blood tests for syphilis (RPR), HIV, and hepatitis C  Type 2 diabetes mellitus with complications (retinopathy, neuropathy, nephropathy, hyperlipidemia, hyperglycemia) Type 2 diabetes with complications including retinopathy, neuropathy, nephropathy, and hyperlipidemia. Last A1c was 6.4. She has not been using Dexcom since Christmas but has resumed using it today. Insulin  dosage is 30 units, reduced from 60 units. - Monitor blood glucose levels with Dexcom - Adjust insulin  dosage as needed  Chronic kidney disease stage 4 with secondary hyperparathyroidism and anemia Chronic kidney disease stage 4 with secondary hyperparathyroidism and anemia. Recent BMP showed GFR of 26 and elevated PTH at 323. Followed by nephrology. - Continue follow-up with nephrology  Essential hypertension Blood pressure is well-controlled at 128/80 mmHg. - Continue current antihypertensive regimen  Obesity, class 2 Class 2 obesity with recent weight loss from 206 to 193 pounds. BMI is 33.13.  Major depressive disorder, recurrent, mild Recurrent mild major depressive disorder.  Lumbar radiculopathy Requiring lidocaine  patches for pain management. - Refilled lidocaine  patches  Mild intermittent asthma Mild intermittent asthma.  Menopausal symptoms (hot flashes) Menopausal symptoms including hot flashes.        Follow up plan: Return in about 6 months (around 05/20/2025) for follow up. "

## 2024-11-21 LAB — COMPREHENSIVE METABOLIC PANEL WITH GFR
AG Ratio: 1.2 (calc) (ref 1.0–2.5)
ALT: 17 U/L (ref 6–29)
AST: 15 U/L (ref 10–35)
Albumin: 4.3 g/dL (ref 3.6–5.1)
Alkaline phosphatase (APISO): 82 U/L (ref 37–153)
BUN/Creatinine Ratio: 16 (calc) (ref 6–22)
BUN: 35 mg/dL — ABNORMAL HIGH (ref 7–25)
CO2: 25 mmol/L (ref 20–32)
Calcium: 10 mg/dL (ref 8.6–10.4)
Chloride: 106 mmol/L (ref 98–110)
Creat: 2.23 mg/dL — ABNORMAL HIGH (ref 0.50–1.03)
Globulin: 3.5 g/dL (ref 1.9–3.7)
Glucose, Bld: 76 mg/dL (ref 65–99)
Potassium: 4.5 mmol/L (ref 3.5–5.3)
Sodium: 139 mmol/L (ref 135–146)
Total Bilirubin: 0.7 mg/dL (ref 0.2–1.2)
Total Protein: 7.8 g/dL (ref 6.1–8.1)
eGFR: 25 mL/min/{1.73_m2} — ABNORMAL LOW

## 2024-11-21 LAB — LIPID PANEL
Cholesterol: 103 mg/dL
HDL: 48 mg/dL — ABNORMAL LOW
LDL Cholesterol (Calc): 42 mg/dL
Non-HDL Cholesterol (Calc): 55 mg/dL
Total CHOL/HDL Ratio: 2.1 (calc)
Triglycerides: 57 mg/dL

## 2024-11-21 LAB — CBC WITH DIFFERENTIAL/PLATELET
Absolute Lymphocytes: 2424 {cells}/uL (ref 850–3900)
Absolute Monocytes: 441 {cells}/uL (ref 200–950)
Basophils Absolute: 52 {cells}/uL (ref 0–200)
Basophils Relative: 0.9 %
Eosinophils Absolute: 81 {cells}/uL (ref 15–500)
Eosinophils Relative: 1.4 %
HCT: 40 % (ref 35.9–46.0)
Hemoglobin: 13.1 g/dL (ref 11.7–15.5)
MCH: 30.2 pg (ref 27.0–33.0)
MCHC: 32.8 g/dL (ref 31.6–35.4)
MCV: 92.2 fL (ref 81.4–101.7)
MPV: 11.2 fL (ref 7.5–12.5)
Monocytes Relative: 7.6 %
Neutro Abs: 2801 {cells}/uL (ref 1500–7800)
Neutrophils Relative %: 48.3 %
Platelets: 256 10*3/uL (ref 140–400)
RBC: 4.34 Million/uL (ref 3.80–5.10)
RDW: 12.6 % (ref 11.0–15.0)
Total Lymphocyte: 41.8 %
WBC: 5.8 10*3/uL (ref 3.8–10.8)

## 2024-11-21 LAB — HIV ANTIBODY (ROUTINE TESTING W REFLEX)
HIV 1&2 Ab, 4th Generation: NONREACTIVE
HIV FINAL INTERPRETATION: NEGATIVE

## 2024-11-21 LAB — SYPHILIS: RPR W/REFLEX TO RPR TITER AND TREPONEMAL ANTIBODIES, TRADITIONAL SCREENING AND DIAGNOSIS ALGORITHM: RPR Ser Ql: NONREACTIVE

## 2024-11-21 LAB — HEPATITIS C ANTIBODY: Hepatitis C Ab: NONREACTIVE

## 2024-11-23 ENCOUNTER — Ambulatory Visit: Payer: Self-pay | Admitting: Nurse Practitioner

## 2024-11-23 DIAGNOSIS — Z113 Encounter for screening for infections with a predominantly sexual mode of transmission: Secondary | ICD-10-CM

## 2024-11-25 LAB — CERVICOVAGINAL ANCILLARY ONLY
Comment: NEGATIVE
Comment: NEGATIVE
Comment: NEGATIVE
Comment: NEGATIVE
Comment: NEGATIVE
Comment: NORMAL

## 2024-11-26 ENCOUNTER — Other Ambulatory Visit: Payer: Self-pay | Admitting: Nurse Practitioner

## 2024-11-26 DIAGNOSIS — E1165 Type 2 diabetes mellitus with hyperglycemia: Secondary | ICD-10-CM

## 2024-11-26 NOTE — Telephone Encounter (Signed)
 Requested Prescriptions  Refused Prescriptions Disp Refills   LANTUS  SOLOSTAR 100 UNIT/ML Solostar Pen [Pharmacy Med Name: LANTUS  SOLOSTAR 100 UNIT/ML]  1    Sig: INJECT 60 UNITS UNDER THE SKIN AT BEDTIME     Endocrinology:  Diabetes - Insulins Passed - 11/26/2024  4:38 PM      Passed - HBA1C is between 0 and 7.9 and within 180 days    Hemoglobin A1C  Date Value Ref Range Status  11/20/2024 6.4 (A) 4.0 - 5.6 % Final  06/16/2024 5.7  Final         Passed - Valid encounter within last 6 months    Recent Outpatient Visits           6 days ago Essential hypertension   [facility] Gareth Clarity F, FNP   4 months ago Type 2 diabetes mellitus with hyperglycemia, with long-term current use of insulin  [facility]   [place] [facility] Leavy Mole, PA-C   8 months ago DM type 2 with diabetic peripheral neuropathy [facility])   [facility] Leavy Mole, PA-C   10 months ago DM type 2 with diabetic peripheral neuropathy [facility])   [facility] Leavy Mole, PA-C   10 months ago No-show for appointment   [facility] Leavy Mole, PA-C

## 2024-11-27 ENCOUNTER — Other Ambulatory Visit (HOSPITAL_COMMUNITY): Payer: Self-pay

## 2024-11-27 ENCOUNTER — Other Ambulatory Visit (HOSPITAL_COMMUNITY)
Admission: RE | Admit: 2024-11-27 | Discharge: 2024-11-27 | Disposition: A | Source: Ambulatory Visit | Attending: Nurse Practitioner | Admitting: Nurse Practitioner

## 2024-11-27 ENCOUNTER — Telehealth: Payer: Self-pay | Admitting: Pharmacy Technician

## 2024-11-27 DIAGNOSIS — Z113 Encounter for screening for infections with a predominantly sexual mode of transmission: Secondary | ICD-10-CM | POA: Diagnosis present

## 2024-11-27 LAB — CERVICOVAGINAL ANCILLARY ONLY
Bacterial Vaginitis (gardnerella): NEGATIVE
Candida Glabrata: NEGATIVE
Candida Vaginitis: NEGATIVE
Chlamydia: NEGATIVE
Comment: NEGATIVE
Comment: NEGATIVE
Comment: NEGATIVE
Comment: NEGATIVE
Comment: NEGATIVE
Comment: NORMAL
Neisseria Gonorrhea: NEGATIVE
Trichomonas: NEGATIVE

## 2024-11-27 NOTE — Addendum Note (Signed)
 Addended by: YVONE WARREN BROCKS on: 11/27/2024 10:29 AM   Modules accepted: Orders

## 2024-11-27 NOTE — Telephone Encounter (Signed)
 Pharmacy Patient Advocate Encounter  Received notification from [facility] that Prior Authorization for Lidocaine  5% patches has been APPROVED from 11/27/24 to 02/25/25. Unable to obtain price due to refill too soon rejection, last fill date 11/27/24 next available fill date02/22/26   PA #/Case ID/Reference #: [id number]

## 2024-11-27 NOTE — Telephone Encounter (Signed)
 Pharmacy Patient Advocate Encounter   Received notification from CoverMyMeds that prior authorization for Lidocaine  5% patches is required/requested.   Insurance verification completed.   The patient is insured through [facility].   Per test claim: PA required; PA started via CoverMyMeds. KEY [id number] . Waiting for clinical questions to populate.

## 2025-05-21 ENCOUNTER — Ambulatory Visit: Admitting: Nurse Practitioner

## 2025-10-11 ENCOUNTER — Ambulatory Visit: Admitting: Family Medicine
